# Patient Record
Sex: Female | Born: 1955 | Race: White | Hispanic: No | Marital: Married | Smoking: Never smoker
Health system: Southern US, Community
[De-identification: ages and names within clinical notes are randomized; demographics above are authoritative.]

## PROBLEM LIST (undated history)

## (undated) DIAGNOSIS — G8929 Other chronic pain: Secondary | ICD-10-CM

## (undated) DIAGNOSIS — I10 Essential (primary) hypertension: Secondary | ICD-10-CM

## (undated) DIAGNOSIS — M549 Dorsalgia, unspecified: Secondary | ICD-10-CM

## (undated) DIAGNOSIS — Z9889 Other specified postprocedural states: Secondary | ICD-10-CM

## (undated) DIAGNOSIS — Z973 Presence of spectacles and contact lenses: Secondary | ICD-10-CM

## (undated) DIAGNOSIS — M5137 Other intervertebral disc degeneration, lumbosacral region: Secondary | ICD-10-CM

## (undated) DIAGNOSIS — M109 Gout, unspecified: Secondary | ICD-10-CM

## (undated) DIAGNOSIS — R112 Nausea with vomiting, unspecified: Secondary | ICD-10-CM

## (undated) DIAGNOSIS — M1990 Unspecified osteoarthritis, unspecified site: Secondary | ICD-10-CM

## (undated) DIAGNOSIS — M51379 Other intervertebral disc degeneration, lumbosacral region without mention of lumbar back pain or lower extremity pain: Secondary | ICD-10-CM

## (undated) DIAGNOSIS — R351 Nocturia: Secondary | ICD-10-CM

## (undated) DIAGNOSIS — A809 Acute poliomyelitis, unspecified: Secondary | ICD-10-CM

## (undated) DIAGNOSIS — K219 Gastro-esophageal reflux disease without esophagitis: Secondary | ICD-10-CM

## (undated) DIAGNOSIS — E119 Type 2 diabetes mellitus without complications: Secondary | ICD-10-CM

## (undated) HISTORY — PX: COLONOSCOPY: SHX174

## (undated) HISTORY — PX: FOOT SURGERY: SHX648

## (undated) HISTORY — PX: ABDOMINAL HYSTERECTOMY: SHX81

## (undated) HISTORY — PX: TUBAL LIGATION: SHX77

## (undated) HISTORY — PX: OTHER SURGICAL HISTORY: SHX169

---

## 2000-01-02 HISTORY — PX: TOTAL ABDOMINAL HYSTERECTOMY: SHX209

## 2000-01-02 HISTORY — PX: ABDOMINAL HYSTERECTOMY: SHX81

## 2001-01-31 ENCOUNTER — Emergency Department (HOSPITAL_COMMUNITY): Admission: EM | Admit: 2001-01-31 | Discharge: 2001-01-31 | Payer: Self-pay | Admitting: Emergency Medicine

## 2001-01-31 ENCOUNTER — Encounter: Payer: Self-pay | Admitting: Emergency Medicine

## 2006-07-04 ENCOUNTER — Other Ambulatory Visit: Admission: RE | Admit: 2006-07-04 | Discharge: 2006-07-04 | Payer: Self-pay | Admitting: Gynecology

## 2007-07-24 ENCOUNTER — Ambulatory Visit (HOSPITAL_COMMUNITY): Admission: RE | Admit: 2007-07-24 | Discharge: 2007-07-24 | Payer: Self-pay | Admitting: Family Medicine

## 2009-04-09 ENCOUNTER — Emergency Department (HOSPITAL_COMMUNITY): Admission: EM | Admit: 2009-04-09 | Discharge: 2009-04-09 | Payer: Self-pay | Admitting: Emergency Medicine

## 2010-03-22 LAB — RAPID STREP SCREEN (MED CTR MEBANE ONLY): Streptococcus, Group A Screen (Direct): NEGATIVE

## 2010-05-09 ENCOUNTER — Other Ambulatory Visit: Payer: Self-pay | Admitting: Medical

## 2011-12-18 ENCOUNTER — Encounter (HOSPITAL_COMMUNITY): Payer: Self-pay

## 2011-12-18 ENCOUNTER — Emergency Department (HOSPITAL_COMMUNITY)
Admission: EM | Admit: 2011-12-18 | Discharge: 2011-12-18 | Disposition: A | Discharge status: Home / Self Care | Payer: BC Managed Care – PPO | Attending: Emergency Medicine | Admitting: Emergency Medicine

## 2011-12-18 ENCOUNTER — Emergency Department (HOSPITAL_COMMUNITY): Payer: BC Managed Care – PPO

## 2011-12-18 DIAGNOSIS — R112 Nausea with vomiting, unspecified: Secondary | ICD-10-CM

## 2011-12-18 DIAGNOSIS — Z8619 Personal history of other infectious and parasitic diseases: Secondary | ICD-10-CM | POA: Insufficient documentation

## 2011-12-18 DIAGNOSIS — R197 Diarrhea, unspecified: Secondary | ICD-10-CM | POA: Insufficient documentation

## 2011-12-18 DIAGNOSIS — I10 Essential (primary) hypertension: Secondary | ICD-10-CM | POA: Insufficient documentation

## 2011-12-18 DIAGNOSIS — R7402 Elevation of levels of lactic acid dehydrogenase (LDH): Secondary | ICD-10-CM | POA: Insufficient documentation

## 2011-12-18 DIAGNOSIS — R109 Unspecified abdominal pain: Secondary | ICD-10-CM

## 2011-12-18 DIAGNOSIS — R7401 Elevation of levels of liver transaminase levels: Secondary | ICD-10-CM | POA: Insufficient documentation

## 2011-12-18 DIAGNOSIS — R1011 Right upper quadrant pain: Secondary | ICD-10-CM | POA: Insufficient documentation

## 2011-12-18 DIAGNOSIS — Z79899 Other long term (current) drug therapy: Secondary | ICD-10-CM | POA: Insufficient documentation

## 2011-12-18 HISTORY — DX: Essential (primary) hypertension: I10

## 2011-12-18 HISTORY — DX: Acute poliomyelitis, unspecified: A80.9

## 2011-12-18 LAB — HEPATIC FUNCTION PANEL
ALT: 65 U/L — ABNORMAL HIGH (ref 0–35)
AST: 67 U/L — ABNORMAL HIGH (ref 0–37)
Albumin: 4.3 g/dL (ref 3.5–5.2)
Alkaline Phosphatase: 90 U/L (ref 39–117)
Bilirubin, Direct: 0.1 mg/dL (ref 0.0–0.3)
Indirect Bilirubin: 0.5 mg/dL (ref 0.3–0.9)
Total Bilirubin: 0.6 mg/dL (ref 0.3–1.2)
Total Protein: 8.7 g/dL — ABNORMAL HIGH (ref 6.0–8.3)

## 2011-12-18 LAB — CBC WITH DIFFERENTIAL/PLATELET
Basophils Absolute: 0 10*3/uL (ref 0.0–0.1)
Basophils Relative: 0 % (ref 0–1)
Eosinophils Absolute: 0 10*3/uL (ref 0.0–0.7)
Eosinophils Relative: 0 % (ref 0–5)
HCT: 48.6 % — ABNORMAL HIGH (ref 36.0–46.0)
Hemoglobin: 16.7 g/dL — ABNORMAL HIGH (ref 12.0–15.0)
Lymphocytes Relative: 9 % — ABNORMAL LOW (ref 12–46)
Lymphs Abs: 1.1 10*3/uL (ref 0.7–4.0)
MCH: 28.5 pg (ref 26.0–34.0)
MCHC: 34.4 g/dL (ref 30.0–36.0)
MCV: 82.9 fL (ref 78.0–100.0)
Monocytes Absolute: 0.4 10*3/uL (ref 0.1–1.0)
Monocytes Relative: 3 % (ref 3–12)
Neutro Abs: 10.5 10*3/uL — ABNORMAL HIGH (ref 1.7–7.7)
Neutrophils Relative %: 87 % — ABNORMAL HIGH (ref 43–77)
Platelets: 315 10*3/uL (ref 150–400)
RBC: 5.86 MIL/uL — ABNORMAL HIGH (ref 3.87–5.11)
RDW: 13.1 % (ref 11.5–15.5)
WBC: 12 10*3/uL — ABNORMAL HIGH (ref 4.0–10.5)

## 2011-12-18 LAB — URINE MICROSCOPIC-ADD ON

## 2011-12-18 LAB — URINALYSIS, ROUTINE W REFLEX MICROSCOPIC
Bilirubin Urine: NEGATIVE
Glucose, UA: NEGATIVE mg/dL
Ketones, ur: NEGATIVE mg/dL
Leukocytes, UA: NEGATIVE
Nitrite: NEGATIVE
Protein, ur: NEGATIVE mg/dL
Specific Gravity, Urine: 1.015 (ref 1.005–1.030)
Urobilinogen, UA: 0.2 mg/dL (ref 0.0–1.0)
pH: 6 (ref 5.0–8.0)

## 2011-12-18 LAB — BASIC METABOLIC PANEL
BUN: 16 mg/dL (ref 6–23)
CO2: 25 mEq/L (ref 19–32)
Calcium: 10 mg/dL (ref 8.4–10.5)
Chloride: 101 mEq/L (ref 96–112)
Creatinine, Ser: 0.82 mg/dL (ref 0.50–1.10)
GFR calc Af Amer: >90 mL/min (ref >90)
GFR calc non Af Amer: 79 mL/min — ABNORMAL LOW (ref >90)
Glucose, Bld: 134 mg/dL — ABNORMAL HIGH (ref 70–99)
Potassium: 3.2 mEq/L — ABNORMAL LOW (ref 3.5–5.1)
Sodium: 139 mEq/L (ref 135–145)

## 2011-12-18 LAB — LIPASE, BLOOD: Lipase: 14 U/L (ref 11–59)

## 2011-12-18 MED ORDER — ONDANSETRON HCL 4 MG/2ML IJ SOLN
4.0000 mg | Freq: Once | INTRAMUSCULAR | Status: AC
  Administered 2011-12-18: 4 mg via INTRAVENOUS
  Filled 2011-12-18: qty 2

## 2011-12-18 MED ORDER — SODIUM CHLORIDE 0.9 % IV BOLUS (SEPSIS)
1000.0000 mL | Freq: Once | INTRAVENOUS | Status: AC
  Administered 2011-12-18: 1000 mL via INTRAVENOUS

## 2011-12-18 MED ORDER — ONDANSETRON HCL 4 MG/2ML IJ SOLN
INTRAMUSCULAR | Status: AC
  Filled 2011-12-18: qty 2

## 2011-12-18 MED ORDER — HYDROMORPHONE HCL PF 1 MG/ML IJ SOLN
1.0000 mg | Freq: Once | INTRAMUSCULAR | Status: AC
  Administered 2011-12-18: 1 mg via INTRAVENOUS
  Filled 2011-12-18: qty 1

## 2011-12-18 MED ORDER — OXYCODONE-ACETAMINOPHEN 5-325 MG PO TABS: 1.0000 | ORAL_TABLET | ORAL | Status: AC | PRN

## 2011-12-18 MED ORDER — ONDANSETRON HCL 4 MG/2ML IJ SOLN
4.0000 mg | Freq: Once | INTRAMUSCULAR | Status: AC
  Administered 2011-12-18: 4 mg via INTRAVENOUS

## 2011-12-18 MED ORDER — HYDROMORPHONE HCL PF 1 MG/ML IJ SOLN
0.5000 mg | Freq: Once | INTRAMUSCULAR | Status: AC
  Administered 2011-12-18: 0.5 mg via INTRAVENOUS

## 2011-12-18 MED ORDER — SODIUM CHLORIDE 0.9 % IV SOLN
Freq: Once | INTRAVENOUS | Status: AC
  Administered 2011-12-18: 11:00:00 via INTRAVENOUS

## 2011-12-18 MED ORDER — HYDROMORPHONE HCL PF 1 MG/ML IJ SOLN
INTRAMUSCULAR | Status: AC
  Filled 2011-12-18: qty 1

## 2011-12-18 MED ORDER — IOHEXOL 300 MG/ML  SOLN
100.0000 mL | Freq: Once | INTRAMUSCULAR | Status: AC | PRN
  Administered 2011-12-18: 100 mL via INTRAVENOUS

## 2011-12-18 MED ORDER — METOCLOPRAMIDE HCL 10 MG PO TABS: 10.0000 mg | ORAL_TABLET | Freq: Four times a day (QID) | ORAL | Status: DC | PRN

## 2011-12-18 NOTE — ED Provider Notes (Addendum)
History   This chart was scribed for Sara Hutching, MD by Sofie Rower, ED Scribe. The patient was seen in room APA17/APA17 and the patient's care was started at 11:28AM.     CSN: [id number]  Arrival date & time 12/18/11  1033   First MD Initiated Contact with Patient 12/18/11 1128      Chief Complaint  Patient presents with  . Emesis    (Consider location/radiation/quality/duration/timing/severity/associated sxs/prior treatment) The history is provided by the patient. No language interpreter was used.    Sara Stuart is a 56 y.o. female , with a hx of hypertension, who presents to the Emergency Department complaining of sudden, progressively worsening, emesis (multiple episodes), onset yesterday (12/17/11).  Associated symptoms include abdominal pain located at the LUQ and diarrhea ( multiple episodes, characterized as watery). The pt informs she has not taken any medications to relieve her symptoms at present.  The pt denies chest pain and flank pain. Furthermore, the pt denies any exposure to sick contacts.   The pt does not smoke or drink alcohol.   PCP is Dr. Margo Common.    Past Medical History  Diagnosis Date  . Polio   . Hypertension     History reviewed. No pertinent past surgical history.  History reviewed. No pertinent family history.  History  Substance Use Topics  . Smoking status: Not on file  . Smokeless tobacco: Not on file  . Alcohol Use: No    OB History    Grav Para Term Preterm Abortions TAB SAB Ect Mult Living                  Review of Systems  Cardiovascular: Negative for chest pain.  Gastrointestinal: Positive for nausea, vomiting, abdominal pain and diarrhea.  Genitourinary: Negative for flank pain.  All other systems reviewed and are negative.    Allergies  Review of patient's allergies indicates no known allergies.  Home Medications   Current Outpatient Rx  Name  Route  Sig  Dispense  Refill  . COLCHICINE 0.6 MG PO TABS   Oral   Take  0.6 mg by mouth daily.         Marland Kitchen DILTIAZEM HCL ER 120 MG PO CP24   Oral   Take 120 mg by mouth daily.         Marland Kitchen OMEPRAZOLE 40 MG PO CPDR   Oral   Take 40 mg by mouth daily.         Marland Kitchen PRAVASTATIN SODIUM 40 MG PO TABS   Oral   Take 40 mg by mouth daily.         . TRIAMTERENE-HCTZ 37.5-25 MG PO CAPS   Oral   Take 1 capsule by mouth daily.           BP 130/93  Pulse 90  Temp 97.8 F (36.6 C) (Oral)  Resp 20  Ht 5' (1.524 m)  Wt 240 lb (108.863 kg)  BMI 46.87 kg/m2  SpO2 98%  Physical Exam  Nursing note and vitals reviewed. Constitutional: She is oriented to person, place, and time. She appears well-developed and well-nourished.       Obese.   HENT:  Head: Normocephalic and atraumatic.  Eyes: Conjunctivae normal and EOM are normal. Pupils are equal, round, and reactive to light.  Neck: Normal range of motion. Neck supple.  Cardiovascular: Normal rate, regular rhythm and normal heart sounds.   Pulmonary/Chest: Effort normal and breath sounds normal.  Abdominal: Soft. Bowel sounds are normal. There  is tenderness.       Slightly tender at the LUQ.   Musculoskeletal: Normal range of motion.  Neurological: She is alert and oriented to person, place, and time.  Skin: Skin is warm and dry.  Psychiatric: She has a normal mood and affect.    ED Course  Procedures (including critical care time)  DIAGNOSTIC STUDIES: Oxygen Saturation is 98% on room air, normal by my interpretation.    COORDINATION OF CARE:   11:44 AM- Treatment plan discussed with patient. Pt agrees with treatment.    Results for orders placed during the hospital encounter of 12/18/11  CBC WITH DIFFERENTIAL      Component Value Range   WBC 12.0 (*) 4.0 - 10.5 K/uL   RBC 5.86 (*) 3.87 - 5.11 MIL/uL   Hemoglobin 16.7 (*) 12.0 - 15.0 g/dL   HCT 82.9 (*) 56.2 - 13.0 %   MCV 82.9  78.0 - 100.0 fL   MCH 28.5  26.0 - 34.0 pg   MCHC 34.4  30.0 - 36.0 g/dL   RDW 86.5  78.4 - 69.6 %   Platelets  315  150 - 400 K/uL   Neutrophils Relative 87 (*) 43 - 77 %   Neutro Abs 10.5 (*) 1.7 - 7.7 K/uL   Lymphocytes Relative 9 (*) 12 - 46 %   Lymphs Abs 1.1  0.7 - 4.0 K/uL   Monocytes Relative 3  3 - 12 %   Monocytes Absolute 0.4  0.1 - 1.0 K/uL   Eosinophils Relative 0  0 - 5 %   Eosinophils Absolute 0.0  0.0 - 0.7 K/uL   Basophils Relative 0  0 - 1 %   Basophils Absolute 0.0  0.0 - 0.1 K/uL  BASIC METABOLIC PANEL      Component Value Range   Sodium 139  135 - 145 mEq/L   Potassium 3.2 (*) 3.5 - 5.1 mEq/L   Chloride 101  96 - 112 mEq/L   CO2 25  19 - 32 mEq/L   Glucose, Bld 134 (*) 70 - 99 mg/dL   BUN 16  6 - 23 mg/dL   Creatinine, Ser 2.95  0.50 - 1.10 mg/dL   Calcium 28.4  8.4 - 13.2 mg/dL   GFR calc non Af Amer 79 (*) >90 mL/min   GFR calc Af Amer >90  >90 mL/min  URINALYSIS, ROUTINE W REFLEX MICROSCOPIC      Component Value Range   Color, Urine YELLOW  YELLOW   APPearance CLEAR  CLEAR   Specific Gravity, Urine 1.015  1.005 - 1.030   pH 6.0  5.0 - 8.0   Glucose, UA NEGATIVE  NEGATIVE mg/dL   Hgb urine dipstick TRACE (*) NEGATIVE   Bilirubin Urine NEGATIVE  NEGATIVE   Ketones, ur NEGATIVE  NEGATIVE mg/dL   Protein, ur NEGATIVE  NEGATIVE mg/dL   Urobilinogen, UA 0.2  0.0 - 1.0 mg/dL   Nitrite NEGATIVE  NEGATIVE   Leukocytes, UA NEGATIVE  NEGATIVE  URINE MICROSCOPIC-ADD ON      Component Value Range   Squamous Epithelial / LPF FEW (*) RARE   WBC, UA 0-2  <3 WBC/hpf   RBC / HPF 0-2  <3 RBC/hpf   Bacteria, UA FEW (*) RARE  HEPATIC FUNCTION PANEL      Component Value Range   Total Protein 8.7 (*) 6.0 - 8.3 g/dL   Albumin 4.3  3.5 - 5.2 g/dL   AST 67 (*) 0 - 37 U/L  ALT 65 (*) 0 - 35 U/L   Alkaline Phosphatase 90  39 - 117 U/L   Total Bilirubin 0.6  0.3 - 1.2 mg/dL   Bilirubin, Direct 0.1  0.0 - 0.3 mg/dL   Indirect Bilirubin 0.5  0.3 - 0.9 mg/dL  LIPASE, BLOOD      Component Value Range   Lipase 14  11 - 59 U/L       No results found.   1. Abdominal pain    2. Nausea & vomiting   3. Elevated transaminase level      Date: 12/18/2011  Rate: 92  Rhythm: normal sinus rhythm  QRS Axis: normal  Intervals: normal  ST/T Wave abnormalities: normal  Conduction Disutrbances: none  Narrative Interpretation: unremarkable     MDM  Recheck at 1510... Patient still having left upper quadrant pain.  Liver functions noted to be mildly elevated.  Additionally patient has slight elevation in white blood cell count c left shift.  Will obtain CT scan of abdomen/ pelvis with contrast. Discussed with Dr. Preston Fleeting      I personally performed the services described in this documentation, which was scribed in my presence. The recorded information has been reviewed and is accurate.   Sara Hutching, MD 12/18/11 1527  CT scan shows an enlarged lymph node in the portal area with recommendation to have the scan repeated in 3 months. Patient is informed of this as well as the finding of elevated LFTs. She is feeling better after IV fluids and IV hydromorphone and ondansetron. She is discharged with prescription for Percocet and metoclopramide and is referred back to her PCP  Dione Booze, MD 12/18/11 1726

## 2011-12-18 NOTE — ED Notes (Signed)
Complain of n/v and diarrhea since last night

## 2011-12-18 NOTE — ED Notes (Signed)
Pt complain of some chest pain after sitting in waiting room for about 10 min

## 2012-05-27 ENCOUNTER — Other Ambulatory Visit: Payer: Self-pay | Admitting: Neurosurgery

## 2012-06-25 ENCOUNTER — Encounter (HOSPITAL_COMMUNITY): Payer: Self-pay | Admitting: Pharmacy Technician

## 2012-06-26 ENCOUNTER — Encounter (HOSPITAL_COMMUNITY): Payer: Self-pay

## 2012-06-26 ENCOUNTER — Ambulatory Visit (HOSPITAL_COMMUNITY)
Admission: RE | Admit: 2012-06-26 | Discharge: 2012-06-26 | Disposition: A | Discharge status: Home / Self Care | Payer: Commercial Managed Care - PPO | Source: Ambulatory Visit | Attending: Neurosurgery | Admitting: Neurosurgery

## 2012-06-26 ENCOUNTER — Encounter (HOSPITAL_COMMUNITY)
Admission: RE | Admit: 2012-06-26 | Discharge: 2012-06-26 | Disposition: A | Discharge status: Home / Self Care | Payer: Commercial Managed Care - PPO | Source: Ambulatory Visit | Attending: Neurosurgery | Admitting: Neurosurgery

## 2012-06-26 DIAGNOSIS — Z0183 Encounter for blood typing: Secondary | ICD-10-CM | POA: Insufficient documentation

## 2012-06-26 DIAGNOSIS — Z01812 Encounter for preprocedural laboratory examination: Secondary | ICD-10-CM | POA: Insufficient documentation

## 2012-06-26 DIAGNOSIS — Z01818 Encounter for other preprocedural examination: Secondary | ICD-10-CM | POA: Insufficient documentation

## 2012-06-26 DIAGNOSIS — M5126 Other intervertebral disc displacement, lumbar region: Secondary | ICD-10-CM | POA: Insufficient documentation

## 2012-06-26 HISTORY — DX: Nausea with vomiting, unspecified: R11.2

## 2012-06-26 HISTORY — DX: Other specified postprocedural states: Z98.890

## 2012-06-26 HISTORY — DX: Gastro-esophageal reflux disease without esophagitis: K21.9

## 2012-06-26 HISTORY — DX: Unspecified osteoarthritis, unspecified site: M19.90

## 2012-06-26 LAB — CBC WITH DIFFERENTIAL/PLATELET
Basophils Absolute: 0 10*3/uL (ref 0.0–0.1)
Basophils Relative: 0 % (ref 0–1)
Eosinophils Absolute: 0.4 10*3/uL (ref 0.0–0.7)
Eosinophils Relative: 5 % (ref 0–5)
HCT: 42.4 % (ref 36.0–46.0)
Hemoglobin: 14.5 g/dL (ref 12.0–15.0)
Lymphocytes Relative: 33 % (ref 12–46)
Lymphs Abs: 3 10*3/uL (ref 0.7–4.0)
MCH: 28.4 pg (ref 26.0–34.0)
MCHC: 34.2 g/dL (ref 30.0–36.0)
MCV: 83.1 fL (ref 78.0–100.0)
Monocytes Absolute: 0.6 10*3/uL (ref 0.1–1.0)
Monocytes Relative: 7 % (ref 3–12)
Neutro Abs: 5 10*3/uL (ref 1.7–7.7)
Neutrophils Relative %: 55 % (ref 43–77)
Platelets: 271 10*3/uL (ref 150–400)
RBC: 5.1 MIL/uL (ref 3.87–5.11)
RDW: 13 % (ref 11.5–15.5)
WBC: 9 10*3/uL (ref 4.0–10.5)

## 2012-06-26 LAB — BASIC METABOLIC PANEL
BUN: 18 mg/dL (ref 6–23)
CO2: 27 mEq/L (ref 19–32)
Calcium: 9.3 mg/dL (ref 8.4–10.5)
Chloride: 103 mEq/L (ref 96–112)
Creatinine, Ser: 0.75 mg/dL (ref 0.50–1.10)
GFR calc Af Amer: >90 mL/min (ref >90)
GFR calc non Af Amer: >90 mL/min (ref >90)
Glucose, Bld: 103 mg/dL — ABNORMAL HIGH (ref 70–99)
Potassium: 3.7 mEq/L (ref 3.5–5.1)
Sodium: 140 mEq/L (ref 135–145)

## 2012-06-26 LAB — SURGICAL PCR SCREEN
MRSA, PCR: NEGATIVE
Staphylococcus aureus: POSITIVE — AB

## 2012-06-26 LAB — ABO/RH: ABO/RH(D): O POS

## 2012-06-26 LAB — TYPE AND SCREEN
ABO/RH(D): O POS
Antibody Screen: NEGATIVE

## 2012-06-26 NOTE — Progress Notes (Deleted)
FAXED SIGNED BLOOD REFUSAL TO BLOOD BANK.  LEFT MESSAGE AT DR. Luiz Blare OFFICE ABOUT PATIENT BEING JEHOVAS WITNESS AND NO T+S DONE.  PATIENT STATED DR. GRAVES WAS AWARE.

## 2012-06-26 NOTE — Pre-Procedure Instructions (Signed)
Sara Stuart  06/26/2012   Your procedure is scheduled on:   Monday   07/07/12     Report to [facility] at 530 AM.  Call this number if you have problems the morning of surgery: [phone]   Remember:   Do not eat food or drink liquids after midnight.   Take these medicines the morning of surgery with A SIP OF WATER:   COLCHICINE, DILTIAZEM, HYDROCODONE  OR ULTRAM IF NEEDED, PRILOSEC   Do not wear jewelry, make-up or nail polish.  Do not wear lotions, powders, or perfumes. You may wear deodorant.  Do not shave 48 hours prior to surgery. Men may shave face and neck.  Do not bring valuables to the hospital.  [facility] is not responsible                   for any belongings or valuables.  Contacts, dentures or bridgework may not be worn into surgery.  Leave suitcase in the car. After surgery it may be brought to your room.  For patients admitted to the hospital, checkout time is 11:00 AM the day of  discharge.   Patients discharged the day of surgery will not be allowed to drive  home.  Name and phone number of your driver:  Special Instructions: Shower using CHG 2 nights before surgery and the night before surgery.  If you shower the day of surgery use CHG.  Use special wash - you have one bottle of CHG for all showers.  You should use approximately 1/3 of the bottle for each shower.   Please read over the following fact sheets that you were given: Pain Booklet, Coughing and Deep Breathing, Blood Transfusion Information, MRSA Information and Surgical Site Infection Prevention

## 2012-06-26 NOTE — Progress Notes (Signed)
06/26/12 1338  OBSTRUCTIVE SLEEP APNEA  Have you ever been diagnosed with sleep apnea through a sleep study? No  Do you snore loudly (loud enough to be heard through closed doors)?  0  Do you often feel tired, fatigued, or sleepy during the daytime? 1  Has anyone observed you stop breathing during your sleep? 0  Do you have, or are you being treated for high blood pressure? 1  BMI more than 35 kg/m2? 1  Age over 56 years old? 1  Gender: 0  Obstructive Sleep Apnea Score 4  Score 4 or greater  Results sent to PCP

## 2012-07-01 HISTORY — PX: BACK SURGERY: SHX140

## 2012-07-07 MED ORDER — CEFAZOLIN SODIUM-DEXTROSE 2-3 GM-% IV SOLR
2.0000 g | INTRAVENOUS | Status: AC
  Administered 2012-07-08: 2 g via INTRAVENOUS
  Filled 2012-07-07: qty 50

## 2012-07-07 MED ORDER — DEXAMETHASONE SODIUM PHOSPHATE 10 MG/ML IJ SOLN
10.0000 mg | INTRAMUSCULAR | Status: AC
  Administered 2012-07-08: 10 mg via INTRAVENOUS
  Filled 2012-07-07: qty 1

## 2012-07-07 NOTE — Progress Notes (Signed)
Pt made aware to report to [facility] on 07/08/12 at 8:30AM.

## 2012-07-08 ENCOUNTER — Ambulatory Visit (HOSPITAL_COMMUNITY): Payer: Commercial Managed Care - PPO

## 2012-07-08 ENCOUNTER — Encounter (HOSPITAL_COMMUNITY)
Admission: RE | Disposition: A | Discharge status: Home / Self Care | Payer: Self-pay | Source: Ambulatory Visit | Attending: Neurosurgery

## 2012-07-08 ENCOUNTER — Encounter (HOSPITAL_COMMUNITY): Payer: Self-pay | Admitting: Certified Registered"

## 2012-07-08 ENCOUNTER — Ambulatory Visit (HOSPITAL_COMMUNITY): Payer: Commercial Managed Care - PPO | Admitting: Certified Registered"

## 2012-07-08 ENCOUNTER — Inpatient Hospital Stay (HOSPITAL_COMMUNITY)
Admission: RE | Admit: 2012-07-08 | Discharge: 2012-07-09 | DRG: 460 | Disposition: A | Discharge status: Home / Self Care | Payer: Commercial Managed Care - PPO | Source: Ambulatory Visit | Attending: Neurosurgery | Admitting: Neurosurgery

## 2012-07-08 ENCOUNTER — Encounter (HOSPITAL_COMMUNITY): Payer: Self-pay | Admitting: *Deleted

## 2012-07-08 DIAGNOSIS — K219 Gastro-esophageal reflux disease without esophagitis: Secondary | ICD-10-CM | POA: Diagnosis present

## 2012-07-08 DIAGNOSIS — M48062 Spinal stenosis, lumbar region with neurogenic claudication: Principal | ICD-10-CM | POA: Diagnosis present

## 2012-07-08 DIAGNOSIS — M4310 Spondylolisthesis, site unspecified: Secondary | ICD-10-CM | POA: Diagnosis present

## 2012-07-08 SURGERY — POSTERIOR LUMBAR FUSION 1 LEVEL
Anesthesia: General | Site: Back | Wound class: Clean

## 2012-07-08 MED ORDER — SODIUM CHLORIDE 0.9 % IV SOLN
INTRAVENOUS | Status: AC
  Filled 2012-07-08: qty 500

## 2012-07-08 MED ORDER — METOCLOPRAMIDE HCL 10 MG PO TABS
10.0000 mg | ORAL_TABLET | Freq: Four times a day (QID) | ORAL | Status: DC | PRN
  Filled 2012-07-08: qty 1

## 2012-07-08 MED ORDER — PANTOPRAZOLE SODIUM 40 MG PO TBEC
80.0000 mg | DELAYED_RELEASE_TABLET | Freq: Every day | ORAL | Status: DC
  Administered 2012-07-09: 80 mg via ORAL
  Filled 2012-07-08: qty 2

## 2012-07-08 MED ORDER — HYDROMORPHONE HCL PF 1 MG/ML IJ SOLN
0.5000 mg | INTRAMUSCULAR | Status: DC | PRN
  Administered 2012-07-08: 1 mg via INTRAVENOUS
  Filled 2012-07-08: qty 1

## 2012-07-08 MED ORDER — VECURONIUM BROMIDE 10 MG IV SOLR
INTRAVENOUS | Status: DC | PRN
  Administered 2012-07-08 (×2): 2 mg via INTRAVENOUS

## 2012-07-08 MED ORDER — DIAZEPAM 5 MG PO TABS
5.0000 mg | ORAL_TABLET | Freq: Four times a day (QID) | ORAL | Status: DC | PRN
  Administered 2012-07-08 – 2012-07-09 (×2): 5 mg via ORAL
  Administered 2012-07-09: 10 mg via ORAL
  Filled 2012-07-08: qty 2
  Filled 2012-07-08: qty 1

## 2012-07-08 MED ORDER — POLYETHYLENE GLYCOL 3350 17 G PO PACK
17.0000 g | PACK | Freq: Every day | ORAL | Status: DC | PRN
  Filled 2012-07-08: qty 1

## 2012-07-08 MED ORDER — LACTATED RINGERS IV SOLN
INTRAVENOUS | Status: DC | PRN
  Administered 2012-07-08 (×2): via INTRAVENOUS

## 2012-07-08 MED ORDER — PROPOFOL 10 MG/ML IV BOLUS
INTRAVENOUS | Status: DC | PRN
  Administered 2012-07-08: 200 mg via INTRAVENOUS

## 2012-07-08 MED ORDER — OXYCODONE-ACETAMINOPHEN 5-325 MG PO TABS
1.0000 | ORAL_TABLET | ORAL | Status: DC | PRN
  Administered 2012-07-08 (×2): 2 via ORAL
  Filled 2012-07-08 (×2): qty 2

## 2012-07-08 MED ORDER — SENNA 8.6 MG PO TABS
1.0000 | ORAL_TABLET | Freq: Two times a day (BID) | ORAL | Status: DC
  Administered 2012-07-09: 8.6 mg via ORAL
  Filled 2012-07-08 (×2): qty 1

## 2012-07-08 MED ORDER — BUPIVACAINE HCL (PF) 0.25 % IJ SOLN
INTRAMUSCULAR | Status: DC | PRN
  Administered 2012-07-08: 20 mL

## 2012-07-08 MED ORDER — GLYCOPYRROLATE 0.2 MG/ML IJ SOLN
INTRAMUSCULAR | Status: DC | PRN
  Administered 2012-07-08: 0.4 mg via INTRAVENOUS

## 2012-07-08 MED ORDER — CEFAZOLIN SODIUM 1-5 GM-% IV SOLN
1.0000 g | Freq: Three times a day (TID) | INTRAVENOUS | Status: AC
  Administered 2012-07-08 – 2012-07-09 (×2): 1 g via INTRAVENOUS
  Filled 2012-07-08 (×2): qty 50

## 2012-07-08 MED ORDER — ZOLPIDEM TARTRATE 5 MG PO TABS: 5.0000 mg | ORAL_TABLET | Freq: Every evening | ORAL | Status: DC | PRN

## 2012-07-08 MED ORDER — SODIUM CHLORIDE 0.9 % IJ SOLN
3.0000 mL | Freq: Two times a day (BID) | INTRAMUSCULAR | Status: DC
  Administered 2012-07-08 (×2): 3 mL via INTRAVENOUS

## 2012-07-08 MED ORDER — SODIUM CHLORIDE 0.9 % IJ SOLN: 3.0000 mL | INTRAMUSCULAR | Status: DC | PRN

## 2012-07-08 MED ORDER — DILTIAZEM HCL ER 120 MG PO CP24
120.0000 mg | ORAL_CAPSULE | Freq: Every day | ORAL | Status: DC
  Administered 2012-07-09: 120 mg via ORAL
  Filled 2012-07-08 (×2): qty 1

## 2012-07-08 MED ORDER — NEOSTIGMINE METHYLSULFATE 1 MG/ML IJ SOLN
INTRAMUSCULAR | Status: DC | PRN
  Administered 2012-07-08: 2.5 mg via INTRAVENOUS

## 2012-07-08 MED ORDER — 0.9 % SODIUM CHLORIDE (POUR BTL) OPTIME
TOPICAL | Status: DC | PRN
  Administered 2012-07-08: 1000 mL

## 2012-07-08 MED ORDER — BACITRACIN 50000 UNITS IM SOLR
INTRAMUSCULAR | Status: AC
  Filled 2012-07-08: qty 1

## 2012-07-08 MED ORDER — ONDANSETRON HCL 4 MG/2ML IJ SOLN: 4.0000 mg | Freq: Once | INTRAMUSCULAR | Status: DC | PRN

## 2012-07-08 MED ORDER — ARTIFICIAL TEARS OP OINT
TOPICAL_OINTMENT | OPHTHALMIC | Status: DC | PRN
  Administered 2012-07-08: 1 via OPHTHALMIC

## 2012-07-08 MED ORDER — ONDANSETRON HCL 4 MG/2ML IJ SOLN
INTRAMUSCULAR | Status: DC | PRN
  Administered 2012-07-08: 4 mg via INTRAVENOUS

## 2012-07-08 MED ORDER — ONDANSETRON HCL 4 MG/2ML IJ SOLN: 4.0000 mg | INTRAMUSCULAR | Status: DC | PRN

## 2012-07-08 MED ORDER — LIDOCAINE HCL (CARDIAC) 20 MG/ML IV SOLN
INTRAVENOUS | Status: DC | PRN
  Administered 2012-07-08: 100 mg via INTRAVENOUS

## 2012-07-08 MED ORDER — TRIAMTERENE-HCTZ 37.5-25 MG PO CAPS
1.0000 | ORAL_CAPSULE | Freq: Every day | ORAL | Status: DC
  Administered 2012-07-08 – 2012-07-09 (×2): 1 via ORAL
  Filled 2012-07-08 (×2): qty 1

## 2012-07-08 MED ORDER — ROCURONIUM BROMIDE 100 MG/10ML IV SOLN
INTRAVENOUS | Status: DC | PRN
  Administered 2012-07-08: 50 mg via INTRAVENOUS

## 2012-07-08 MED ORDER — ACETAMINOPHEN 650 MG RE SUPP: 650.0000 mg | RECTAL | Status: DC | PRN

## 2012-07-08 MED ORDER — ATORVASTATIN CALCIUM 10 MG PO TABS
10.0000 mg | ORAL_TABLET | Freq: Every day | ORAL | Status: DC
  Administered 2012-07-08: 10 mg via ORAL
  Filled 2012-07-08 (×2): qty 1

## 2012-07-08 MED ORDER — BISACODYL 10 MG RE SUPP: 10.0000 mg | Freq: Every day | RECTAL | Status: DC | PRN

## 2012-07-08 MED ORDER — SODIUM CHLORIDE 0.9 % IR SOLN
Status: DC | PRN
  Administered 2012-07-08: 10:00:00

## 2012-07-08 MED ORDER — HYDROMORPHONE HCL PF 1 MG/ML IJ SOLN
INTRAMUSCULAR | Status: AC
  Filled 2012-07-08: qty 1

## 2012-07-08 MED ORDER — SIMVASTATIN 20 MG PO TABS: 20.0000 mg | ORAL_TABLET | Freq: Every day | ORAL | Status: DC

## 2012-07-08 MED ORDER — ALUM & MAG HYDROXIDE-SIMETH 200-200-20 MG/5ML PO SUSP: 30.0000 mL | Freq: Four times a day (QID) | ORAL | Status: DC | PRN

## 2012-07-08 MED ORDER — FENTANYL CITRATE 0.05 MG/ML IJ SOLN
INTRAMUSCULAR | Status: DC | PRN
  Administered 2012-07-08: 100 ug via INTRAVENOUS
  Administered 2012-07-08: 50 ug via INTRAVENOUS
  Administered 2012-07-08: 150 ug via INTRAVENOUS

## 2012-07-08 MED ORDER — THROMBIN 20000 UNITS EX SOLR
CUTANEOUS | Status: DC | PRN
  Administered 2012-07-08: 10:00:00 via TOPICAL

## 2012-07-08 MED ORDER — MENTHOL 3 MG MT LOZG
1.0000 | LOZENGE | OROMUCOSAL | Status: DC | PRN
  Filled 2012-07-08: qty 9

## 2012-07-08 MED ORDER — PHENOL 1.4 % MT LIQD: 1.0000 | OROMUCOSAL | Status: DC | PRN

## 2012-07-08 MED ORDER — FLEET ENEMA 7-19 GM/118ML RE ENEM
1.0000 | ENEMA | Freq: Once | RECTAL | Status: AC | PRN
  Filled 2012-07-08: qty 1

## 2012-07-08 MED ORDER — ACETAMINOPHEN 325 MG PO TABS: 650.0000 mg | ORAL_TABLET | ORAL | Status: DC | PRN

## 2012-07-08 MED ORDER — HYDROCODONE-ACETAMINOPHEN 5-325 MG PO TABS
1.0000 | ORAL_TABLET | ORAL | Status: DC | PRN
  Administered 2012-07-09 (×2): 2 via ORAL
  Filled 2012-07-08 (×2): qty 2

## 2012-07-08 MED ORDER — HYDROMORPHONE HCL PF 1 MG/ML IJ SOLN
0.2500 mg | INTRAMUSCULAR | Status: DC | PRN
Start: 2012-07-08 — End: 2012-07-08
  Administered 2012-07-08 (×4): 0.5 mg via INTRAVENOUS

## 2012-07-08 MED ORDER — DIAZEPAM 5 MG PO TABS
ORAL_TABLET | ORAL | Status: AC
  Filled 2012-07-08: qty 1

## 2012-07-08 MED ORDER — PHENYLEPHRINE HCL 10 MG/ML IJ SOLN
INTRAMUSCULAR | Status: DC | PRN
  Administered 2012-07-08: 120 ug via INTRAVENOUS

## 2012-07-08 SURGICAL SUPPLY — 67 items
ADH SKN CLS APL DERMABOND .7 (GAUZE/BANDAGES/DRESSINGS)
ADH SKN CLS LQ APL DERMABOND (GAUZE/BANDAGES/DRESSINGS) ×1
APL SKNCLS STERI-STRIP NONHPOA (GAUZE/BANDAGES/DRESSINGS) ×1
BAG DECANTER FOR FLEXI CONT (MISCELLANEOUS) ×2 IMPLANT
BENZOIN TINCTURE PRP APPL 2/3 (GAUZE/BANDAGES/DRESSINGS) ×2 IMPLANT
BLADE SURG ROTATE 9660 (MISCELLANEOUS) IMPLANT
BRUSH SCRUB EZ PLAIN DRY (MISCELLANEOUS) ×2 IMPLANT
BUR MATCHSTICK NEURO 3.0 LAGG (BURR) ×2 IMPLANT
CAGE 10X22 (Cage) ×2 IMPLANT
CANISTER SUCTION 2500CC (MISCELLANEOUS) ×2 IMPLANT
CAP LCK SPNE (Orthopedic Implant) ×4 IMPLANT
CAP LOCK SPINE RADIUS (Orthopedic Implant) ×4 IMPLANT
CAP LOCKING (Orthopedic Implant) ×8 IMPLANT
CLOTH BEACON ORANGE TIMEOUT ST (SAFETY) ×2 IMPLANT
CONT SPEC 4OZ CLIKSEAL STRL BL (MISCELLANEOUS) ×4 IMPLANT
COVER BACK TABLE 24X17X13 BIG (DRAPES) IMPLANT
COVER TABLE BACK 60X90 (DRAPES) ×2 IMPLANT
DECANTER SPIKE VIAL GLASS SM (MISCELLANEOUS) IMPLANT
DERMABOND ADHESIVE PROPEN (GAUZE/BANDAGES/DRESSINGS) ×1
DERMABOND ADVANCED (GAUZE/BANDAGES/DRESSINGS)
DERMABOND ADVANCED .7 DNX12 (GAUZE/BANDAGES/DRESSINGS) IMPLANT
DERMABOND ADVANCED .7 DNX6 (GAUZE/BANDAGES/DRESSINGS) ×1 IMPLANT
DRAPE C-ARM 42X72 X-RAY (DRAPES) ×4 IMPLANT
DRAPE LAPAROTOMY 100X72X124 (DRAPES) ×2 IMPLANT
DRAPE POUCH INSTRU U-SHP 10X18 (DRAPES) ×2 IMPLANT
DRAPE PROXIMA HALF (DRAPES) IMPLANT
DRAPE SURG 17X23 STRL (DRAPES) ×8 IMPLANT
DURAPREP 26ML APPLICATOR (WOUND CARE) ×2 IMPLANT
ELECT REM PT RETURN 9FT ADLT (ELECTROSURGICAL) ×2
ELECTRODE REM PT RTRN 9FT ADLT (ELECTROSURGICAL) ×1 IMPLANT
EVACUATOR 1/8 PVC DRAIN (DRAIN) ×2 IMPLANT
GAUZE SPONGE 4X4 16PLY XRAY LF (GAUZE/BANDAGES/DRESSINGS) IMPLANT
GLOVE BIOGEL PI IND STRL 7.0 (GLOVE) ×2 IMPLANT
GLOVE BIOGEL PI INDICATOR 7.0 (GLOVE) ×2
GLOVE ECLIPSE 7.5 STRL STRAW (GLOVE) ×2 IMPLANT
GLOVE ECLIPSE 8.5 STRL (GLOVE) ×4 IMPLANT
GLOVE EXAM NITRILE LRG STRL (GLOVE) IMPLANT
GLOVE EXAM NITRILE MD LF STRL (GLOVE) ×2 IMPLANT
GLOVE EXAM NITRILE XL STR (GLOVE) IMPLANT
GLOVE EXAM NITRILE XS STR PU (GLOVE) IMPLANT
GLOVE SS BIOGEL STRL SZ 6.5 (GLOVE) ×3 IMPLANT
GLOVE SUPERSENSE BIOGEL SZ 6.5 (GLOVE) ×3
GOWN BRE IMP SLV AUR LG STRL (GOWN DISPOSABLE) ×4 IMPLANT
GOWN BRE IMP SLV AUR XL STRL (GOWN DISPOSABLE) ×4 IMPLANT
GOWN STRL REIN 2XL LVL4 (GOWN DISPOSABLE) IMPLANT
KIT BASIN OR (CUSTOM PROCEDURE TRAY) ×2 IMPLANT
KIT ROOM TURNOVER OR (KITS) ×2 IMPLANT
NEEDLE HYPO 22GX1.5 SAFETY (NEEDLE) ×2 IMPLANT
NS IRRIG 1000ML POUR BTL (IV SOLUTION) ×2 IMPLANT
PACK LAMINECTOMY NEURO (CUSTOM PROCEDURE TRAY) ×2 IMPLANT
ROD RADIUS 40MM (Neuro Prosthesis/Implant) ×4 IMPLANT
ROD SPNL 40X5.5XNS TI RDS (Neuro Prosthesis/Implant) ×2 IMPLANT
SCREW 5.75X40M (Screw) ×8 IMPLANT
SPONGE GAUZE 4X4 12PLY (GAUZE/BANDAGES/DRESSINGS) ×2 IMPLANT
SPONGE SURGIFOAM ABS GEL 100 (HEMOSTASIS) ×2 IMPLANT
STRIP CLOSURE SKIN 1/2X4 (GAUZE/BANDAGES/DRESSINGS) ×2 IMPLANT
SUT VIC AB 0 CT1 18XCR BRD8 (SUTURE) ×1 IMPLANT
SUT VIC AB 0 CT1 8-18 (SUTURE) ×2
SUT VIC AB 2-0 CT1 18 (SUTURE) ×2 IMPLANT
SUT VIC AB 3-0 SH 8-18 (SUTURE) ×4 IMPLANT
SYR 20ML ECCENTRIC (SYRINGE) ×2 IMPLANT
TAPE CLOTH SURG 4X10 WHT LF (GAUZE/BANDAGES/DRESSINGS) ×2 IMPLANT
TOWEL OR 17X24 6PK STRL BLUE (TOWEL DISPOSABLE) ×2 IMPLANT
TOWEL OR 17X26 10 PK STRL BLUE (TOWEL DISPOSABLE) ×2 IMPLANT
TRAY FOLEY CATH 14FRSI W/METER (CATHETERS) ×2 IMPLANT
WATER STERILE IRR 1000ML POUR (IV SOLUTION) ×2 IMPLANT
WEDGE TANGENT 10X26MM ×2 IMPLANT

## 2012-07-08 NOTE — Transfer of Care (Signed)
Immediate Anesthesia Transfer of Care Note  Patient: Sara Stuart  Procedure(s) Performed: Procedure(s) with comments: POSTERIOR LUMBAR FUSION 1 LEVEL (N/A) - Lumbar four-five posterior lumbar interbody fusion  Patient Location: PACU  Anesthesia Type:General  Level of Consciousness: awake and oriented  Airway & Oxygen Therapy: Patient Spontanous Breathing and Patient connected to nasal cannula oxygen  Post-op Assessment: Report given to PACU RN and Patient moving all extremities X 4  Post vital signs: Reviewed and stable  Complications: No apparent anesthesia complications

## 2012-07-08 NOTE — Op Note (Signed)
Date of procedure: 07/08/2012  Date of dictation: Same  Service: Neurosurgery  Preoperative diagnosis: L4-5 grade 1 degenerative spondylolisthesis with stenosis and neurogenic claudication.  Postoperative diagnosis: Same  Procedure Name: L4-5 decompressive laminectomy with bilateral L4 and L5 decompressive foraminotomies in order to relieve pressure from symptomatic nerve roots, much more extensive decompression that would be required for simple interbody fusion alone.  L4-5 posterior lumbar interbody fusion utilizing tangent interbody allograft wedge Telamon interbody peek cage and local autograft.  L4-5 posterior lateral pieces utilizing nonsegmental pedicle screw station and local autograft.  Surgeon:Rayleigh Gillyard A.Lisia Westbay, M.D.  Asst. Surgeon: Gerlene Fee  Anesthesia: General  Indication: 57 year old female with severe back and bilateral lower extremity pain left greater than right consistent with neurogenic claudication. Workup demonstrates evidence of a grade 1 L4-5 degenerative spondylolisthesis with very severe stenosis affecting both L4 and L5 nerve roots. Patient has failed conservative management and presents now for decompression and fusion surgery in hopes of improving her symptoms.  Operative note: After induction of anesthesia, patient positioned prone onto Wilson frame and appropriately padded. Lumbar region prepped and draped. Incision made overlying the L3-4-5 levels. This carried down sharply bilaterally in a subperiosteal dissection performed exposing the lamina facet joints and transverse processes of L4 and L5. Retractor was placed intraoperative fluoroscopy is used levels were confirmed. Decompressive laminectomies then performed using Leksell rongeurs Kerrison or his high-speed drill to remove the entire lamina of L4 the entire inferior facet of L4 bilaterally superior facet of L5 bilaterally and the superior aspect the L5 lamina. All bone is cleaned in use and later autografting.  Ligament flavum was elevated and resected these will fashion and Kerrison rongeurs. Very extensive decompressive foraminotomies were performed of course exiting L4 and L5 nerve roots bilaterally. Epidermides pus is quite good and cut. Bilateral discectomies performed at L4-5. The spaces and reamed and then cut with 10 mm tangent is his with a 10 mm distractor less than patient's contralateral side. On the right side a 10 x 22 mm Telamon cage packed with morselized autograft was impacted in place and recessed roughly 2 mm and posterior portal margin. On the left-sided distractor was removed. The space was further curettaged and morselize autograft packed in the interspace. A 10 x 26 mm tangent wedges and packed in place. During the final impaction of this graft the posterior one third of the graft fractured. This was removed. It was felt best just to leave the remaining allograft and rather than try to get it out. The interspace was further packed with morselized autograft. Pedicles of L4 and L5 were decorticated using high-speed drill. Pedicles and probed using pedicle awl. Alternatives and tapped with a 5.2 fiber temperature Olson probed and found to be solidly within bone. 5.75 x 40 mm radius brand screws and placed bilaterally at L4 and L5. Transverse processes of L4 and L5 were then decorticated high-speed drill. Morselized autograft packed posterior laterally for later fusion. Short segment titanium rods and posterior screws at L4-L5. Locking caps were then placed over the screws were locking caps and engaged with the construct under compression. Final images revealed good position bone graft and hardware at proper upper level with normal alignment spine. Wounds that area one final time. Hemostasis assured with bipolar try her. Gelfoam postoperative hemostasis. Medium Hemovac drain was left in epidural space. Wounds and close in layers with Vicryl sutures. Steri-Strips triggers were applied. There were no  apparent locations. Patient well and she returns they're covering postop.

## 2012-07-08 NOTE — Plan of Care (Signed)
Problem: Consults Goal: Diagnosis - Spinal Surgery Outcome: Completed/Met Date Met:  07/08/12 Thoraco/Lumbar Spine Fusion

## 2012-07-08 NOTE — Preoperative (Signed)
Beta Blockers   Reason not to administer Beta Blockers:Not Applicable 

## 2012-07-08 NOTE — H&P (Signed)
Sara Stuart is an 57 y.o. female.   Chief Complaint: Back and leg pain HPI: 57 year old female with severe back and bilateral lower extremity pain paresthesias and weakness consistent with neurogenic claudication and failing conservative management. Workup demonstrates evidence of a grade 1 L4-5 degenerative spondylolisthesis with severe stenosis. Patient presents now for decompression and fusion in hopes of improving her symptoms.  Past Medical History  Diagnosis Date  . Polio   . Hypertension   . Complication of anesthesia   . PONV (postoperative nausea and vomiting)   . GERD (gastroesophageal reflux disease)   . Arthritis     Past Surgical History  Procedure Laterality Date  . Abdominal hysterectomy      TUBAL PREGNANCY    History reviewed. No pertinent family history. Social History:  reports that she has never smoked. She does not have any smokeless tobacco history on file. She reports that she does not drink alcohol or use illicit drugs.  Allergies: No Known Allergies  Medications Prior to Admission  Medication Sig Dispense Refill  . cyclobenzaprine (FLEXERIL) 10 MG tablet Take 10 mg by mouth at bedtime as needed for muscle spasms.      Marland Kitchen diltiazem (DILACOR XR) 120 MG 24 hr capsule Take 120 mg by mouth daily.      Marland Kitchen HYDROcodone-acetaminophen (NORCO) 10-325 MG per tablet Take 1 tablet by mouth 2 (two) times daily.      . metoCLOPramide (REGLAN) 10 MG tablet Take 1 tablet (10 mg total) by mouth every 6 (six) hours as needed (nausea).  30 tablet  0  . omeprazole (PRILOSEC) 40 MG capsule Take 40 mg by mouth daily.      . pravastatin (PRAVACHOL) 40 MG tablet Take 40 mg by mouth daily.      . traMADol (ULTRAM) 50 MG tablet Take 100 mg by mouth at bedtime as needed for pain.      Marland Kitchen triamterene-hydrochlorothiazide (DYAZIDE) 37.5-25 MG per capsule Take 1 capsule by mouth daily.      . colchicine 0.6 MG tablet Take 0.6 mg by mouth daily as needed (for gout pain).         No results  found for this or any previous visit (from the past 48 hour(s)). No results found.  Review of Systems  Constitutional: Negative.   HENT: Negative.   Eyes: Negative.   Respiratory: Negative.   Cardiovascular: Negative.   Gastrointestinal: Negative.   Genitourinary: Negative.   Musculoskeletal: Negative.   Skin: Negative.   Neurological: Negative.   Endo/Heme/Allergies: Negative.   Psychiatric/Behavioral: Negative.     Blood pressure 122/85, pulse 74, temperature 98.1 F (36.7 C), temperature source Oral, resp. rate 20, SpO2 96.00%. Physical Exam  Constitutional: She is oriented to person, place, and time. She appears well-developed and well-nourished. No distress.  HENT:  Head: Normocephalic and atraumatic.  Right Ear: External ear normal.  Left Ear: External ear normal.  Nose: Nose normal.  Mouth/Throat: Oropharynx is clear and moist.  Eyes: Conjunctivae and EOM are normal. Pupils are equal, round, and reactive to light. Right eye exhibits no discharge. Left eye exhibits no discharge.  Neck: Normal range of motion. Neck supple. No tracheal deviation present. No thyromegaly present.  Cardiovascular: Normal rate, regular rhythm, normal heart sounds and intact distal pulses.  Exam reveals no friction rub.   No murmur heard. Respiratory: Effort normal and breath sounds normal. No respiratory distress. She has no wheezes.  GI: Soft. Bowel sounds are normal. She exhibits no distension. There is  no tenderness.  Musculoskeletal: Normal range of motion. She exhibits no edema and no tenderness.  Neurological: She is alert and oriented to person, place, and time. She has normal reflexes. She displays normal reflexes. No cranial nerve deficit. She exhibits normal muscle tone. Coordination normal.  Skin: Skin is warm and dry. No rash noted. She is not diaphoretic. No erythema. No pallor.  Psychiatric: She has a normal mood and affect. Her behavior is normal. Judgment and thought content  normal.     Assessment/Plan L4-5 grade 1 degenerative spondylolisthesis with stenosis and neurogenic claudication. Plan L4-5 decompressive laminectomy with foraminotomies followed by posterior lumbar interbody fusion utilizing tangent interbody allograft wedge Telamon interbody peek cage and local autograft. This be coupled with posterior lateral arthrodesis utilizing nonsegmental pedicle screw instrumentation and local autograft. Risks and benefits have been explained. Patient wishes to proceed.  Sara Stuart A 07/08/2012, 9:39 AM

## 2012-07-08 NOTE — Anesthesia Preprocedure Evaluation (Addendum)
Anesthesia Evaluation  Patient identified by MRN, date of birth, ID band Patient awake    Reviewed: Allergy & Precautions, H&P , NPO status , Patient's Chart, lab work & pertinent test results  Airway Mallampati: I TM Distance: >3 FB Neck ROM: full    Dental  (+) Edentulous Upper, Poor Dentition, Partial Lower and Dental Advisory Given   Pulmonary          Cardiovascular hypertension, Rhythm:regular Rate:Normal     Neuro/Psych  Neuromuscular disease    GI/Hepatic GERD-  ,  Endo/Other    Renal/GU      Musculoskeletal   Abdominal   Peds  Hematology   Anesthesia Other Findings Hx Polio  Reproductive/Obstetrics                          Anesthesia Physical Anesthesia Plan  ASA: II  Anesthesia Plan: General   Post-op Pain Management:    Induction: Intravenous  Airway Management Planned: Oral ETT  Additional Equipment:   Intra-op Plan:   Post-operative Plan: Extubation in OR  Informed Consent: I have reviewed the patients History and Physical, chart, labs and discussed the procedure including the risks, benefits and alternatives for the proposed anesthesia with the patient or authorized representative who has indicated his/her understanding and acceptance.     Plan Discussed with: CRNA, Anesthesiologist and Surgeon  Anesthesia Plan Comments:         Anesthesia Quick Evaluation

## 2012-07-08 NOTE — Anesthesia Postprocedure Evaluation (Signed)
  Anesthesia Post-op Note  Patient: Sara Stuart  Procedure(s) Performed: Procedure(s) with comments: POSTERIOR LUMBAR FUSION 1 LEVEL (N/A) - Lumbar four-five posterior lumbar interbody fusion  Patient Location: PACU  Anesthesia Type:General  Level of Consciousness: awake, oriented and patient cooperative  Airway and Oxygen Therapy: Patient Spontanous Breathing  Post-op Pain: moderate  Post-op Assessment: Post-op Vital signs reviewed, Patient's Cardiovascular Status Stable, Respiratory Function Stable, Patent Airway, No signs of Nausea or vomiting and Pain level controlled  Post-op Vital Signs: stable  Complications: No apparent anesthesia complications

## 2012-07-08 NOTE — Anesthesia Procedure Notes (Signed)
Procedure Name: Intubation Date/Time: 07/08/2012 9:54 AM Performed by: De Nurse Pre-anesthesia Checklist: Patient identified, Emergency Drugs available, Suction available, Patient being monitored and Timeout performed Patient Re-evaluated:Patient Re-evaluated prior to inductionOxygen Delivery Method: Circle system utilized Preoxygenation: Pre-oxygenation with 100% oxygen Intubation Type: IV induction Ventilation: Mask ventilation without difficulty Laryngoscope Size: Mac and 3 Grade View: Grade I Tube type: Oral Tube size: 7.0 mm Number of attempts: 1 Airway Equipment and Method: Stylet Placement Confirmation: ETT inserted through vocal cords under direct vision,  breath sounds checked- equal and bilateral and positive ETCO2 Secured at: 22 cm Tube secured with: Tape Dental Injury: Teeth and Oropharynx as per pre-operative assessment

## 2012-07-08 NOTE — Brief Op Note (Signed)
07/08/2012  12:41 PM  PATIENT:  Sara Stuart  57 y.o. female  PRE-OPERATIVE DIAGNOSIS:  radiculopathy  POST-OPERATIVE DIAGNOSIS:  radiculopathy   PROCEDURE:  Procedure(s) with comments: POSTERIOR LUMBAR FUSION 1 LEVEL (N/A) - Lumbar four-five posterior lumbar interbody fusion  SURGEON:  Surgeon(s) and Role:    * Temple Pacini, MD - Primary    * Reinaldo Meeker, MD - Assisting  PHYSICIAN ASSISTANT:   ASSISTANTS:    ANESTHESIA:   general  EBL:  Total I/O In: 1980 [I.V.:1800; Blood:180] Out: 625 [Urine:125; Blood:500]  BLOOD ADMINISTERED:none  DRAINS: (Medium) Hemovact drain(s) in the Epidural space with  Suction Open   LOCAL MEDICATIONS USED:  MARCAINE     SPECIMEN:  No Specimen  DISPOSITION OF SPECIMEN:  N/A  COUNTS:  YES  TOURNIQUET:  * No tourniquets in log *  DICTATION: .Dragon Dictation  PLAN OF CARE: Admit to inpatient   PATIENT DISPOSITION:  PACU - hemodynamically stable.   Delay start of Pharmacological VTE agent (>24hrs) due to surgical blood loss or risk of bleeding: yes

## 2012-07-09 MED ORDER — HYDROCODONE-ACETAMINOPHEN 10-325 MG PO TABS
1.0000 | ORAL_TABLET | Freq: Two times a day (BID) | ORAL | Status: DC
Start: 2012-07-09 — End: 2012-12-29

## 2012-07-09 MED ORDER — DIAZEPAM 5 MG PO TABS: 5.0000 mg | ORAL_TABLET | Freq: Four times a day (QID) | ORAL | Status: DC | PRN

## 2012-07-09 MED FILL — Sodium Chloride IV Soln 0.9%: INTRAVENOUS | Qty: 2000 | Status: AC

## 2012-07-09 MED FILL — Heparin Sodium (Porcine) Inj 1000 Unit/ML: INTRAMUSCULAR | Qty: 30 | Status: AC

## 2012-07-09 NOTE — Progress Notes (Signed)
Pt. Alert and oriented,follows simple instructions, denies pain. Incision area without swelling, redness or S/S of infection. Voiding adequate clear yellow urine. Moving all extremities well and vitals stable and documented. Script and Lumbar surgery notes instructions given to patient and family member for home safety and precautions. Pt. and family stated understanding of instructions given.

## 2012-07-09 NOTE — Discharge Summary (Signed)
Physician Discharge Summary  Patient ID: Shahla Betsill MRN: [id number] DOB/AGE: 03-10-1955 57 y.o.  Admit date: 07/08/2012 Discharge date: 07/09/2012  Admission Diagnoses:  Discharge Diagnoses:  Principal Problem:   Spinal stenosis, lumbar region, with neurogenic claudication Active Problems:   Degenerative spondylolisthesis   Discharged Condition: good  Hospital Course: Patient m admitted to the hospital where she underwent an uncomplicated L4-5 decompression and fusion with instrumentation. Postoperatively she is done very well. Back and lower extremity pain much improved. Ambulating with minimal difficulty. Ready for discharge home.  Consults:   Significant Diagnostic Studies:   Treatments:   Discharge Exam: Blood pressure 108/72, pulse 94, temperature 98.8 F (37.1 C), temperature source Oral, resp. rate 18, SpO2 94.00%. Awake and alert. Oriented and appropriate. Cranial nerve function intact. Motor examination with chronic stable right dorsiflexion weakness otherwise intact. Sensory intact. Wound clean and dry. Chest and abdomen benign.  Disposition: 01-Home or Self Care     Medication List         colchicine 0.6 MG tablet  Take 0.6 mg by mouth daily as needed (for gout pain).     cyclobenzaprine 10 MG tablet  Commonly known as:  FLEXERIL  Take 10 mg by mouth at bedtime as needed for muscle spasms.     diazepam 5 MG tablet  Commonly known as:  VALIUM  Take 1-2 tablets (5-10 mg total) by mouth every 6 (six) hours as needed.     diltiazem 120 MG 24 hr capsule  Commonly known as:  DILACOR XR  Take 120 mg by mouth daily.     HYDROcodone-acetaminophen 10-325 MG per tablet  Commonly known as:  NORCO  Take 1-2 tablets by mouth 2 (two) times daily.     metoCLOPramide 10 MG tablet  Commonly known as:  REGLAN  Take 1 tablet (10 mg total) by mouth every 6 (six) hours as needed (nausea).     omeprazole 40 MG capsule  Commonly known as:  PRILOSEC  Take 40 mg by mouth  daily.     pravastatin 40 MG tablet  Commonly known as:  PRAVACHOL  Take 40 mg by mouth daily.     traMADol 50 MG tablet  Commonly known as:  ULTRAM  Take 100 mg by mouth at bedtime as needed for pain.     triamterene-hydrochlorothiazide 37.5-25 MG per capsule  Commonly known as:  DYAZIDE  Take 1 capsule by mouth daily.           Follow-up Information   Follow up with Luke Falero A, MD. Call in 2 weeks. (ext [phone])    Contact information:   [address] [place] [address] [phone]       Signed: Kyandre Okray A 07/09/2012, 9:52 AM

## 2012-07-09 NOTE — Evaluation (Signed)
Occupational Therapy Evaluation Patient Details Name: Sara Stuart MRN: [id number] DOB: 09-04-1955 Today's Date: 07/09/2012 Time: 4098-1191 OT Time Calculation (min): 21 min  OT Assessment / Plan / Recommendation History of present illness     Clinical Impression   Pt s/p L4-L5 PLIF.  Pt performing functional mobility at overall supervision level.  Pt will have necessary level of assist at home. Education completed. No further acute OT needs. Will sign off.    OT Assessment  Patient does not need any further OT services    Follow Up Recommendations  No OT follow up;Supervision/Assistance - 24 hour    Barriers to Discharge      Equipment Recommendations   (RW)    Recommendations for Other Services    Frequency       Precautions / Restrictions Precautions Precautions: Back Precaution Comments: pt able to verbalize and demonstrate all back precautions Required Braces or Orthoses: Spinal Brace Spinal Brace: Lumbar corset;Applied in sitting position Restrictions Weight Bearing Restrictions: No   Pertinent Vitals/Pain See vitals    ADL  Upper Body Bathing: Simulated;Set up Where Assessed - Upper Body Bathing: Unsupported sitting Lower Body Bathing: Simulated;Minimal assistance Where Assessed - Lower Body Bathing: Unsupported sitting Upper Body Dressing: Simulated;Set up Where Assessed - Upper Body Dressing: Unsupported sitting Lower Body Dressing: Performed;Minimal assistance Where Assessed - Lower Body Dressing: Unsupported sitting Toilet Transfer: Simulated;Supervision/safety Toilet Transfer Method:  (ambulating) Toilet Transfer Equipment:  (bed) Equipment Used: Rolling walker;Back brace Transfers/Ambulation Related to ADLs: supervision with RW ADL Comments: Pt ambulating in hallway with daughter on OT arrival.  Pt states that she will have family assist with ADLs.    OT Diagnosis:    OT Problem List:   OT Treatment Interventions:     OT Goals(Current goals can be  found in the care plan section)    Visit Information  Last OT Received On: 07/09/12 Assistance Needed: +1       Prior Functioning     Home Living Family/patient expects to be discharged to:: Private residence Living Arrangements: Spouse/significant other Available Help at Discharge: Family;Available 24 hours/day Type of Home: House Home Access: Stairs to enter [employer] of Steps: 3 Entrance Stairs-Rails: Can reach both Home Layout: One level Home Equipment: Bedside commode Prior Function Level of Independence: Independent Communication Communication: No difficulties         Vision/Perception     Cognition  Cognition Arousal/Alertness: Awake/alert Behavior During Therapy: WFL for tasks assessed/performed Overall Cognitive Status: Within Functional Limits for tasks assessed    Extremity/Trunk Assessment Upper Extremity Assessment Upper Extremity Assessment: Overall WFL for tasks assessed Lower Extremity Assessment Lower Extremity Assessment: RLE deficits/detail RLE Deficits / Details: hx of polio affecting R LE, foot drop     Mobility Bed Mobility Bed Mobility: Not assessed Transfers Transfers: Stand to Sit Stand to Sit: 5: Supervision;To bed     Exercise     Balance     End of Session OT - End of Session Equipment Utilized During Treatment: Back brace Activity Tolerance: Patient tolerated treatment well Patient left: in bed;with call bell/phone within reach;with family/visitor present Nurse Communication: Mobility status  GO    07/09/2012 Cipriano Mile OTR/L Pager [phone] Office [phone]  Cipriano Mile 07/09/2012, 9:53 AM

## 2012-07-09 NOTE — Progress Notes (Signed)
UR COMPLETED  

## 2012-07-09 NOTE — Evaluation (Signed)
Physical Therapy One Time Evaluation Patient Details Name: Sara Stuart MRN: [id number] DOB: 22-Mar-1955 Today's Date: 07/09/2012 Time: 4098-1191 PT Time Calculation (min): 13 min  PT Assessment / Plan / Recommendation History of Present Illness     Clinical Impression  Pt is a 57 year old female s/p lumbar four-five posterior lumbar interbody fusion.  Reviewed log roll technique and back precautions with pt.  Pt also donned and doffed back brace appropriately.  Pt reports presurgical L LE pain no longer present.  Pt with hx of polio with R foot drop so discussed using AFO since pt reports tripping on R foot frequently.  Did not perform stairs however reviewed safe technique for 3 steps with 2 reachable rails into home going forward step to pattern.  All education has been completed and the patient has no further questions. No f/u PT however recommend RW upon d/c.  PT is signing off. Thank you for this referral.     PT Assessment  Patent does not need any further PT services    Follow Up Recommendations  No PT follow up    Does the patient have the potential to tolerate intense rehabilitation      Barriers to Discharge        Equipment Recommendations  Rolling walker with 5" wheels    Recommendations for Other Services     Frequency      Precautions / Restrictions Precautions Precautions: Back Precaution Comments: pt able to verbalize and demonstrate all back precautions Required Braces or Orthoses: Spinal Brace Spinal Brace: Lumbar corset;Applied in sitting position Restrictions Weight Bearing Restrictions: No   Pertinent Vitals/Pain Minimal back pain      Mobility  Bed Mobility Bed Mobility: Right Sidelying to Sit;Sit to Sidelying Right Right Sidelying to Sit: 5: Supervision;HOB flat Sit to Sidelying Right: 5: Supervision;HOB flat Details for Bed Mobility Assistance: verbal cues for log roll technique  Transfers Transfers: Sit to Stand;Stand to Sit Sit to Stand: 5:  Supervision;From bed Stand to Sit: 5: Supervision;To bed Ambulation/Gait Ambulation/Gait Assistance: 5: Supervision Ambulation Distance (Feet): 120 Feet Assistive device: Rolling walker Ambulation/Gait Assistance Details: pt requested use of RW and would like one for home to steady, discussed AFO for R ankle however pt reports she tried braces years ago but they did not help however she reports frequently tripping over dragging R foot so encouraged to visit prosthetist/orthotist  Gait velocity: decreased    Exercises     PT Diagnosis:    PT Problem List:   PT Treatment Interventions:       PT Goals(Current goals can be found in the care plan section) Acute Rehab PT Goals PT Goal Formulation: No goals set, d/c therapy  Visit Information  Last PT Received On: 07/09/12 Assistance Needed: +1       Prior Functioning  Home Living Family/patient expects to be discharged to:: Private residence Living Arrangements: Spouse/significant other Available Help at Discharge: Family;Available 24 hours/day Type of Home: House Home Access: Stairs to enter [employer] of Steps: 3 Entrance Stairs-Rails: Can reach both Home Layout: One level Home Equipment: Bedside commode Prior Function Level of Independence: Independent Communication Communication: No difficulties    Cognition  Cognition Arousal/Alertness: Awake/alert Behavior During Therapy: WFL for tasks assessed/performed Overall Cognitive Status: Within Functional Limits for tasks assessed    Extremity/Trunk Assessment Upper Extremity Assessment Upper Extremity Assessment: Overall WFL for tasks assessed Lower Extremity Assessment Lower Extremity Assessment: RLE deficits/detail RLE Deficits / Details: hx of polio affecting R LE,  foot drop   Balance    End of Session PT - End of Session Equipment Utilized During Treatment: Back brace Activity Tolerance: Patient tolerated treatment well Patient left: in bed;with  family/visitor present  GP     Sara Stuart,KATHrine E 07/09/2012, 10:01 AM Zenovia Jarred, PT, DPT 07/09/2012 Pager: [phone]

## 2012-12-29 ENCOUNTER — Emergency Department (HOSPITAL_COMMUNITY): Payer: Commercial Managed Care - PPO

## 2012-12-29 ENCOUNTER — Observation Stay (HOSPITAL_COMMUNITY)
Admission: EM | Admit: 2012-12-29 | Discharge: 2012-12-30 | Disposition: A | Discharge status: Home / Self Care | Payer: Commercial Managed Care - PPO | Attending: Internal Medicine | Admitting: Internal Medicine

## 2012-12-29 ENCOUNTER — Encounter (HOSPITAL_COMMUNITY): Payer: Self-pay | Admitting: Emergency Medicine

## 2012-12-29 DIAGNOSIS — M109 Gout, unspecified: Secondary | ICD-10-CM | POA: Insufficient documentation

## 2012-12-29 DIAGNOSIS — R1013 Epigastric pain: Secondary | ICD-10-CM

## 2012-12-29 DIAGNOSIS — B91 Sequelae of poliomyelitis: Secondary | ICD-10-CM | POA: Insufficient documentation

## 2012-12-29 DIAGNOSIS — I10 Essential (primary) hypertension: Secondary | ICD-10-CM | POA: Diagnosis present

## 2012-12-29 DIAGNOSIS — Z79899 Other long term (current) drug therapy: Secondary | ICD-10-CM | POA: Insufficient documentation

## 2012-12-29 DIAGNOSIS — M48062 Spinal stenosis, lumbar region with neurogenic claudication: Secondary | ICD-10-CM

## 2012-12-29 DIAGNOSIS — R7309 Other abnormal glucose: Secondary | ICD-10-CM | POA: Insufficient documentation

## 2012-12-29 DIAGNOSIS — M4310 Spondylolisthesis, site unspecified: Secondary | ICD-10-CM

## 2012-12-29 DIAGNOSIS — K219 Gastro-esophageal reflux disease without esophagitis: Secondary | ICD-10-CM | POA: Diagnosis present

## 2012-12-29 DIAGNOSIS — R0789 Other chest pain: Principal | ICD-10-CM | POA: Insufficient documentation

## 2012-12-29 DIAGNOSIS — Z8739 Personal history of other diseases of the musculoskeletal system and connective tissue: Secondary | ICD-10-CM | POA: Diagnosis present

## 2012-12-29 DIAGNOSIS — Z8612 Personal history of poliomyelitis: Secondary | ICD-10-CM | POA: Diagnosis present

## 2012-12-29 DIAGNOSIS — R079 Chest pain, unspecified: Secondary | ICD-10-CM | POA: Diagnosis present

## 2012-12-29 LAB — POCT I-STAT TROPONIN I: Troponin i, poc: 0 ng/mL (ref 0.00–0.08)

## 2012-12-29 LAB — COMPREHENSIVE METABOLIC PANEL
ALT: 34 U/L (ref 0–35)
AST: 45 U/L — ABNORMAL HIGH (ref 0–37)
Albumin: 3.9 g/dL (ref 3.5–5.2)
Alkaline Phosphatase: 124 U/L — ABNORMAL HIGH (ref 39–117)
BUN: 21 mg/dL (ref 6–23)
CO2: 25 mEq/L (ref 19–32)
Calcium: 9.6 mg/dL (ref 8.4–10.5)
Chloride: 98 mEq/L (ref 96–112)
Creatinine, Ser: 0.81 mg/dL (ref 0.50–1.10)
GFR calc Af Amer: >90 mL/min (ref >90)
GFR calc non Af Amer: 80 mL/min — ABNORMAL LOW (ref >90)
Glucose, Bld: 141 mg/dL — ABNORMAL HIGH (ref 70–99)
Potassium: 2.9 mEq/L — ABNORMAL LOW (ref 3.5–5.1)
Sodium: 137 mEq/L (ref 135–145)
Total Bilirubin: 0.5 mg/dL (ref 0.3–1.2)
Total Protein: 7.9 g/dL (ref 6.0–8.3)

## 2012-12-29 LAB — CREATININE, SERUM
Creatinine, Ser: 0.74 mg/dL (ref 0.50–1.10)
GFR calc Af Amer: >90 mL/min (ref >90)
GFR calc non Af Amer: >90 mL/min (ref >90)

## 2012-12-29 LAB — CBC
HCT: 42.5 % (ref 36.0–46.0)
HCT: 40.3 % (ref 36.0–46.0)
Hemoglobin: 14.6 g/dL (ref 12.0–15.0)
Hemoglobin: 13.5 g/dL (ref 12.0–15.0)
MCH: 27.6 pg (ref 26.0–34.0)
MCH: 27 pg (ref 26.0–34.0)
MCHC: 34.4 g/dL (ref 30.0–36.0)
MCHC: 33.5 g/dL (ref 30.0–36.0)
MCV: 80.3 fL (ref 78.0–100.0)
MCV: 80.6 fL (ref 78.0–100.0)
Platelets: 285 10*3/uL (ref 150–400)
Platelets: 298 10*3/uL (ref 150–400)
RBC: 5.29 MIL/uL — ABNORMAL HIGH (ref 3.87–5.11)
RBC: 5 MIL/uL (ref 3.87–5.11)
RDW: 13.8 % (ref 11.5–15.5)
RDW: 13.8 % (ref 11.5–15.5)
WBC: 12.3 10*3/uL — ABNORMAL HIGH (ref 4.0–10.5)
WBC: 9.9 10*3/uL (ref 4.0–10.5)

## 2012-12-29 LAB — TROPONIN I: Troponin I: <0.3 ng/mL (ref <0.30)

## 2012-12-29 LAB — PRO B NATRIURETIC PEPTIDE: Pro B Natriuretic peptide (BNP): 24.6 pg/mL (ref 0–125)

## 2012-12-29 LAB — LIPASE, BLOOD: Lipase: 22 U/L (ref 11–59)

## 2012-12-29 MED ORDER — ONDANSETRON HCL 4 MG/2ML IJ SOLN: 4.0000 mg | Freq: Three times a day (TID) | INTRAMUSCULAR | Status: DC | PRN

## 2012-12-29 MED ORDER — GI COCKTAIL ~~LOC~~
30.0000 mL | Freq: Four times a day (QID) | ORAL | Status: DC | PRN
  Filled 2012-12-29: qty 30

## 2012-12-29 MED ORDER — GI COCKTAIL ~~LOC~~
30.0000 mL | Freq: Once | ORAL | Status: AC
  Administered 2012-12-29: 30 mL via ORAL
  Filled 2012-12-29: qty 30

## 2012-12-29 MED ORDER — PANTOPRAZOLE SODIUM 40 MG PO TBEC
80.0000 mg | DELAYED_RELEASE_TABLET | Freq: Every day | ORAL | Status: DC
  Administered 2012-12-30: 80 mg via ORAL
  Filled 2012-12-29: qty 2

## 2012-12-29 MED ORDER — ENOXAPARIN SODIUM 40 MG/0.4ML ~~LOC~~ SOLN
40.0000 mg | SUBCUTANEOUS | Status: DC
  Administered 2012-12-29: 40 mg via SUBCUTANEOUS
  Filled 2012-12-29 (×2): qty 0.4

## 2012-12-29 MED ORDER — POTASSIUM CHLORIDE 20 MEQ/15ML (10%) PO LIQD
40.0000 meq | Freq: Once | ORAL | Status: AC
  Administered 2012-12-29: 40 meq via ORAL
  Filled 2012-12-29: qty 30

## 2012-12-29 MED ORDER — TRIAMTERENE-HCTZ 37.5-25 MG PO CAPS
1.0000 | ORAL_CAPSULE | Freq: Every day | ORAL | Status: DC
  Administered 2012-12-30: 1 via ORAL
  Filled 2012-12-29: qty 1

## 2012-12-29 MED ORDER — HYDROMORPHONE HCL PF 1 MG/ML IJ SOLN: 1.0000 mg | INTRAMUSCULAR | Status: AC | PRN

## 2012-12-29 MED ORDER — MORPHINE SULFATE 4 MG/ML IJ SOLN: 4.0000 mg | Freq: Once | INTRAMUSCULAR | Status: DC

## 2012-12-29 MED ORDER — MORPHINE SULFATE 4 MG/ML IJ SOLN
4.0000 mg | Freq: Once | INTRAMUSCULAR | Status: AC
  Administered 2012-12-29: 4 mg via INTRAVENOUS
  Filled 2012-12-29: qty 1

## 2012-12-29 MED ORDER — ACETAMINOPHEN 325 MG PO TABS: 650.0000 mg | ORAL_TABLET | ORAL | Status: DC | PRN

## 2012-12-29 MED ORDER — MORPHINE SULFATE 2 MG/ML IJ SOLN: 2.0000 mg | INTRAMUSCULAR | Status: DC | PRN

## 2012-12-29 MED ORDER — DILTIAZEM HCL ER 120 MG PO CP24
120.0000 mg | ORAL_CAPSULE | Freq: Every day | ORAL | Status: DC
  Administered 2012-12-30: 120 mg via ORAL
  Filled 2012-12-29: qty 1

## 2012-12-29 MED ORDER — ONDANSETRON HCL 4 MG/2ML IJ SOLN: 4.0000 mg | Freq: Four times a day (QID) | INTRAMUSCULAR | Status: DC | PRN

## 2012-12-29 MED ORDER — ASPIRIN EC 325 MG PO TBEC
325.0000 mg | DELAYED_RELEASE_TABLET | Freq: Every day | ORAL | Status: DC
  Administered 2012-12-29 – 2012-12-30 (×2): 325 mg via ORAL
  Filled 2012-12-29 (×2): qty 1

## 2012-12-29 NOTE — H&P (Addendum)
[facility] History and Physical  Sara Stuart NWG:[id number] DOB: [birth year] DOA: 12/29/2012  Referring physician: Rochele Raring, ER physician PCP: Louie Boston, MD   Chief Complaint: Chest pain  HPI: Sara Stuart is a 57 y.o. female  Past medical history of morbid obesity, hypertension, and GERD who for the last month has noted decreasing exercise tolerance or even walking to the mailbox and back aches are clear short of breath. No associated cough with this. Patient started having this morning severe midepigastric pain which also radiated around to her left breast. She also felt like he went into her back she describes pain as sharp as it being stabbed with a knife and twisting. She also started having diaphoresis. This persisted and she came to the emergency room. There she was given nitroglycerin and morphine and her symptoms started subside. . She denies any shortness of breath. Pain did not radiate anywhere else.  EKG was nonspecific. Troponin was normal. Other lab work was unremarkable. Chest x-ray was unremarkable. They did check an abdominal ultrasound which noted a fatty liver and cholelithiasis without evidence of gallbladder obstruction. Hospitals were called for further evaluation.   Review of Systems:  Patient seen after arrival to floor. Feeling a little better as this pain she is having has eased off down to a 1-2/10. Denies any headaches, vision changes, dysphasia, palpitations, shortness of breath, wheeze, cough, abdominal pain, hematuria, dysuria, constipation, diarrhea, focal extremity numbness or weakness or pain. Review of systems otherwise negative.  Past Medical History  Diagnosis Date  . Polio   . Hypertension   . Complication of anesthesia   . PONV (postoperative nausea and vomiting)   . GERD (gastroesophageal reflux disease)   . Arthritis    Past Surgical History  Procedure Laterality Date  . Abdominal hysterectomy      TUBAL PREGNANCY  . Back surgery   july 2014  . Foot surgery      more than 5 surgeries on right foot related to polio   Social History:  reports that she has never smoked. She has never used smokeless tobacco. She reports that she does not drink alcohol or use illicit drugs. patient lives at home with her husband. Because of her history of polio, she is able to ambulate using a cane  No Known Allergies  Family history: Dad with previous history of heart attack and status post stent placement  Prior to Admission medications   Medication Sig Start Date End Date Taking? Authorizing Provider  colchicine 0.6 MG tablet Take 0.6 mg by mouth daily as needed (for gout pain).    Yes Historical Provider, MD  omeprazole (PRILOSEC) 40 MG capsule Take 40 mg by mouth daily.   Yes Historical Provider, MD  triamterene-hydrochlorothiazide (DYAZIDE) 37.5-25 MG per capsule Take 1 capsule by mouth daily.   Yes Historical Provider, MD  diltiazem (DILACOR XR) 120 MG 24 hr capsule Take 120 mg by mouth daily.    Historical Provider, MD   Physical Exam: Filed Vitals:   12/29/12 1833  BP: 134/95  Pulse: 56  Temp: 97.6 F (36.4 C)  Resp: 18    BP 134/95  Pulse 56  Temp(Src) 97.6 F (36.4 C) (Oral)  Resp 18  Ht 5' (1.524 m)  Wt 97.024 kg (213 lb 14.4 oz)  BMI 41.77 kg/m2  SpO2 96%  General:  Appears calm and comfortable, no acute distress Eyes: Sclera nonicteric, extraocular movements are intact ENT: Normocephalic and atraumatic mucous membranes are slightly dry Neck: Thick, no  JVD Cardiovascular: Regular rate and rhythm, S1-S2  Respiratory: Clear to auscultation bilaterally, decreased breath sounds throughout secondary to body habitus Abdomen: soft, obese, nontender, positive bowel sounds Skin: No skin breaks, tears or lesions Musculoskeletal: No clubbing or cyanosis or edema Psychiatric: Patient is appropriate, no evidence of psychoses Neurologic: No focal deficits           Labs on Admission:  Basic Metabolic  Panel:  Recent Labs Lab 12/29/12 1400  NA 137  K 2.9*  CL 98  CO2 25  GLUCOSE 141*  BUN 21  CREATININE 0.81  CALCIUM 9.6   Liver Function Tests:  Recent Labs Lab 12/29/12 1400  AST 45*  ALT 34  ALKPHOS 124*  BILITOT 0.5  PROT 7.9  ALBUMIN 3.9    Recent Labs Lab 12/29/12 1400  LIPASE 22   No results found for this basename: AMMONIA,  in the last 168 hours CBC:  Recent Labs Lab 12/29/12 1400  WBC 12.3*  HGB 14.6  HCT 42.5  MCV 80.3  PLT 285   Cardiac Enzymes: No results found for this basename: CKTOTAL, CKMB, CKMBINDEX, TROPONINI,  in the last 168 hours  BNP (last 3 results) No results found for this basename: PROBNP,  in the last 8760 hours CBG: No results found for this basename: GLUCAP,  in the last 168 hours  Radiological Exams on Admission: Dg Chest 2 View  12/29/2012   CLINICAL DATA:  Chest pain  EXAM: CHEST  2 VIEW  COMPARISON:  06/26/2012  FINDINGS: Cardiomediastinal silhouette is stable. No acute infiltrate or pleural effusion. No pulmonary edema. Mild degenerative changes thoracic spine.  IMPRESSION: No active cardiopulmonary disease.   Electronically Signed   By: Natasha Mead M.D.   On: 12/29/2012 16:21   US Abdomen Complete  12/29/2012   CLINICAL DATA:  Right upper quadrant epigastric pain.  EXAM: ULTRASOUND ABDOMEN COMPLETE  COMPARISON:  12/18/2011  FINDINGS: Gallbladder:  Multiple gallstones, largest 1.5 cm. No gallbladder wall thickening. Sonographic Murphy's sign absent.  Common bile duct:  Diameter: 5 mm, within normal limits  Liver:  Echodense hepatic parenchyma compatible with hepatic steatosis.  IVC:  Unremarkable  Pancreas:  Visualized portion a pancreatic body unremarkable. Pancreatic head and tail obscured by bowel gas.  Spleen:  Size and appearance within normal limits.  Right Kidney:  Length: 10.2 cm. Echogenicity within normal limits. No mass or hydronephrosis visualized.  Left Kidney:  Length: 12.0 cm. Echogenicity within normal  limits. No mass or hydronephrosis visualized. Dromedary hump noted.  Abdominal aorta:  No aneurysm visualized.  Other findings:  None.  IMPRESSION: 1. Chololithiasis. 2. Hepatic steatosis. 3. Nonvisualization of pancreatic head and tail due to overlying bowel gas.   Electronically Signed   By: Herbie Baltimore M.D.   On: 12/29/2012 16:05    EKG: Independently reviewed. EKG was reviewed by ER physician. Is currently not showing up of her chronic medical record. It shows normal sinus rhythm without any abnormalities. Unchanged from previous.  Assessment/Plan Principal Problem:   Chest pain: Very atypical. More likely GI, the patient does have risk factors. Cycle troponin x3. Keep on telemetry. Given complaints of decreased exercise tolerance, we'll check BNP and echocardiogram. If all tests are normal, discharge home tomorrow Active Problems:   GERD (gastroesophageal reflux disease)   History of gout Hypertension: Continue home meds Morbid obesity: BMI calculated to be 41 History of polio    Code Status: Full code Family Communication: Husband not present, left a message Disposition Plan: Home  likely tomorrow  Time spent: 35 minutes  Hollice Espy [facility] Pager [phone]

## 2012-12-29 NOTE — ED Provider Notes (Signed)
TIME SEEN: 1:55 PM  CHIEF COMPLAINT: Chest pain, shortness of breath, nausea, diaphoresis  HPI: Patient is a 57 y.o. female with a history of hypertension who presents to the emergency department with epigastric pain and chest pain that started at 12:30 this afternoon. She states it was while she was at rest at work. She began to feel very nauseous, diaphoretic and slightly short of breath. She states she took Prilosec with no relief. Patient was also given aspirin and nitroglycerin by EMS which did not relieve her pain. She denies any prior cardiac history. No history of stress test or cardiac catheterization. No prior history of PE or DVT. No recent prolonged immobilization such as hospitalization or long flight, fracture, surgery, trauma, exogenous estrogen use or history of cancer. She denies any fever or cough. Denies her pain is worse with eating. States this feels different than her acid reflux.  ROS: See HPI Constitutional: no fever  Eyes: no drainage  ENT: no runny nose   Cardiovascular:   chest pain  Resp:  SOB  GI: no vomiting GU: no dysuria Integumentary: no rash  Allergy: no hives  Musculoskeletal: no leg swelling  Neurological: no slurred speech ROS otherwise negative  PAST MEDICAL HISTORY/PAST SURGICAL HISTORY:  Past Medical History  Diagnosis Date  . Polio   . Hypertension   . Complication of anesthesia   . PONV (postoperative nausea and vomiting)   . GERD (gastroesophageal reflux disease)   . Arthritis     MEDICATIONS:  Prior to Admission medications   Medication Sig Start Date End Date Taking? Authorizing Provider  colchicine 0.6 MG tablet Take 0.6 mg by mouth daily as needed (for gout pain).    Yes Historical Provider, MD  omeprazole (PRILOSEC) 40 MG capsule Take 40 mg by mouth daily.   Yes Historical Provider, MD  triamterene-hydrochlorothiazide (DYAZIDE) 37.5-25 MG per capsule Take 1 capsule by mouth daily.   Yes Historical Provider, MD  diltiazem (DILACOR  XR) 120 MG 24 hr capsule Take 120 mg by mouth daily.    Historical Provider, MD    ALLERGIES:  No Known Allergies  SOCIAL HISTORY:  History  Substance Use Topics  . Smoking status: Never Smoker   . Smokeless tobacco: Not on file  . Alcohol Use: No    FAMILY HISTORY: No family history on file. Patient's father had coronary artery disease in his 1s. EXAM: BP 132/76  Pulse 54  Temp(Src) 98 F (36.7 C) (Oral)  Resp 17  SpO2 94% CONSTITUTIONAL: Alert and oriented and responds appropriately to questions. Well-appearing; well-nourished HEAD: Normocephalic EYES: Conjunctivae clear, PERRL ENT: normal nose; no rhinorrhea; moist mucous membranes; pharynx without lesions noted NECK: Supple, no meningismus, no LAD  CARD: RRR; S1 and S2 appreciated; no murmurs, no clicks, no rubs, no gallops RESP: Normal chest excursion without splinting or tachypnea; breath sounds clear and equal bilaterally; no wheezes, no rhonchi, no rales,  ABD/GI: Normal bowel sounds; non-distended; soft, tender to palpation in her epigastric region without rebound or guarding, no peritoneal signs, negative Murphy sign BACK:  The back appears normal and is non-tender to palpation, there is no CVA tenderness EXT: Normal ROM in all joints; non-tender to palpation; no edema; normal capillary refill; no cyanosis    SKIN: Normal color for age and race; warm NEURO: Moves all extremities equally PSYCH: The patient's mood and manner are appropriate. Grooming and personal hygiene are appropriate.  MEDICAL DECISION MAKING: Patient with epigastric pain and chest pain that started at 12:30  today. She is hemodynamically stable. Her only risk factor for coronary artery disease is hypertension. She does have a story. Her EKG shows no ischemic changes. We'll obtain cardiac labs, abdominal labs and abdominal ultrasound. Will give pain medication and reassess.  ED PROGRESS: Patient's labs show mild leukocytosis and hypokalemia with  potassium of 2.9.  Will replace. Troponin negative. Chest x-ray clear. Ultrasound shows cholelithiasis with no signs of cholecystitis or choledocholithiasis. Patient reports she has only had very mild improvement in pain after GI cocktail. She states that one nitroglycerin with EMS did not help with her pain either. Will give morphine and reassess. Given her history of obesity, her age, family history and concerning story, have recommended admission for ACS rule out. Her primary care physician is Dr. Margo Common in [facility].  5:00 PM  Spoke with Dr. Rito Ehrlich for admission.      Date: 12/29/2012 13:41  Rate: 60  Rhythm: normal sinus rhythm  QRS Axis: normal  Intervals: normal  ST/T Wave abnormalities: normal  Conduction Disutrbances: none  Narrative Interpretation: unremarkable; unchanged compared to prior EKG in 2013, no new ischemic changes      Layla Maw Ryu Cerreta, DO 12/29/12 1709

## 2012-12-29 NOTE — ED Notes (Signed)
Bed: ZO10 Expected date:  Expected time:  Means of arrival:  Comments: EMS-epigastric pain

## 2012-12-29 NOTE — ED Notes (Addendum)
Per EMS, pt complains of 7/10 epigastric pain that radiates to back since 1230PM, which started at work. Pt states she felt diaphoretic, short of breath, weak, and nauseas. Pt states she has hx of acid reflux. Pt took prilosec, which did not provide relief. Pt given aspirin and nitro by EMS, which did not relieve pain.

## 2012-12-30 DIAGNOSIS — R079 Chest pain, unspecified: Secondary | ICD-10-CM

## 2012-12-30 DIAGNOSIS — R1013 Epigastric pain: Secondary | ICD-10-CM

## 2012-12-30 DIAGNOSIS — I517 Cardiomegaly: Secondary | ICD-10-CM

## 2012-12-30 DIAGNOSIS — I10 Essential (primary) hypertension: Secondary | ICD-10-CM

## 2012-12-30 DIAGNOSIS — K219 Gastro-esophageal reflux disease without esophagitis: Secondary | ICD-10-CM

## 2012-12-30 LAB — BASIC METABOLIC PANEL WITH GFR
BUN: 16 mg/dL (ref 6–23)
CO2: 25 meq/L (ref 19–32)
Calcium: 8.9 mg/dL (ref 8.4–10.5)
Chloride: 103 meq/L (ref 96–112)
Creatinine, Ser: 0.76 mg/dL (ref 0.50–1.10)
GFR calc Af Amer: >90 mL/min
GFR calc non Af Amer: >90 mL/min
Glucose, Bld: 150 mg/dL — ABNORMAL HIGH (ref 70–99)
Potassium: 3.4 meq/L — ABNORMAL LOW (ref 3.7–5.3)
Sodium: 140 meq/L (ref 137–147)

## 2012-12-30 LAB — TROPONIN I
Troponin I: <0.3 ng/mL (ref <0.30)
Troponin I: <0.3 ng/mL (ref <0.30)

## 2012-12-30 LAB — HEMOGLOBIN A1C
Hgb A1c MFr Bld: 6.2 % — ABNORMAL HIGH (ref <5.7)
Mean Plasma Glucose: 131 mg/dL — ABNORMAL HIGH (ref <117)

## 2012-12-30 MED ORDER — UNABLE TO FIND: Status: DC

## 2012-12-30 MED ORDER — POTASSIUM CHLORIDE CRYS ER 20 MEQ PO TBCR
20.0000 meq | EXTENDED_RELEASE_TABLET | Freq: Once | ORAL | Status: DC
  Filled 2012-12-30: qty 1

## 2012-12-30 MED ORDER — ASPIRIN EC 81 MG PO TBEC: 81.0000 mg | DELAYED_RELEASE_TABLET | Freq: Every day | ORAL | Status: AC

## 2012-12-30 NOTE — Progress Notes (Signed)
Echocardiogram 2D Echocardiogram has been performed.  Sara Stuart 12/30/2012, 3:30 PM

## 2012-12-30 NOTE — Discharge Summary (Signed)
Physician Discharge Summary  Sara Stuart ZOX:[id number] DOB: Nov 06, 1955 DOA: 12/29/2012  PCP: Louie Boston, MD  Admit date: 12/29/2012 Discharge date: 12/30/2012  Recommendations for Outpatient Follow-up:  1. Pt will need to follow up with PCP in 2 weeks post discharge 2. Please obtain BMP to evaluate electrolytes and kidney function 3. Please also check CBC to evaluate Hg and Hct levels   Discharge Diagnoses:  Principal Problem:   Chest pain Active Problems:   GERD (gastroesophageal reflux disease)   History of gout   HTN (hypertension)   History of post-polio syndrome   Morbid obesity  atypical chest pain -Troponins negative x3 -EKG with nonspecific changes -Chest pain has resolved -May be related to gastroesophageal reflux as well as anxiety which the patient and her husband relates a history of -BNP 24 -Echocardiogram showed EF 55-60%, grade 1 diastolic dysfunction, no wall motion abnormality -Patient needs followup with primary care physician For possible stress test in the future  GERD -Continue PPI -The patient was only taking it intermittently at home -I have advised patient to take it on a daily basis Hypertension -Controlled on home medications -Continue home medications follow up with primary care physician Morbid obesity -With some modification discussed Impaired glucose tolerance -Lifestyle modification -Patient will need close follow up with her primary care physician Discharge Condition: Stable  Disposition:  discharge home  Diet: Heart healthy Wt Readings from Last 3 Encounters:  12/29/12 97.024 kg (213 lb 14.4 oz)  06/26/12 110.899 kg (244 lb 7.8 oz)  12/18/11 108.863 kg (240 lb)    History of present illness:  57 year old female with a history of hypertension, gastroesophageal reflux disease, and spinal stenosis status post decompressive laminectomy on 07/08/2012 presented with chest discomfort while she was at work. The chest discomfort and  epigastric pain developed while she was working at her desk. This was associated with some diaphoresis. She also had 2 episodes of dry heaves after the chest pain. The patient was admitted for further workup and observation. Troponins have been negative x3. ProBNP was 24. EKG was sinus rhythm with nonspecific ST changes. The patient's chest discomfort resolved and did not recur through the rest of the hospitalization. The patient was given a cardiac diet she was able to tolerate it without any further vomiting. She also complained of some decreased exercise tolerance over the last 6 months with some dyspnea walking to her mailbox. However she has not had any prior chest discomfort, dizziness, palpitations, or syncope. There is no reports of orthopnea, PND. She has never smoked. There was no fevers, chills, nausea, vomiting, sore throat, headache. The patient remained afebrile and hemodynamically stable throughout the hospitalization. She was placed back on her diltiazem and Dyazide and her blood pressure remained stable. She was noted to be hypokalemic which was repleted echocardiogram was obtained.The patient was instructed to followup with her primary care provider within the patient has an appt on 01/05/2013. She may need further testing.      Discharge Exam: Filed Vitals:   12/30/12 0623  BP: 123/74  Pulse: 64  Temp: 98.2 F (36.8 C)  Resp: 16   Filed Vitals:   12/29/12 1833 12/29/12 2127 12/30/12 0100 12/30/12 0623  BP: 134/95 123/73 125/78 123/74  Pulse: 56 64 57 64  Temp: 97.6 F (36.4 C) 98.4 F (36.9 C) 98.4 F (36.9 C) 98.2 F (36.8 C)  TempSrc: Oral Oral Oral Oral  Resp: 18 18 18 16   Height: 5' (1.524 m)     Weight: 97.024  kg (213 lb 14.4 oz)     SpO2: 96% 98% 96% 98%   General: A&O x 3, NAD, pleasant, cooperative Cardiovascular: RRR, no rub, no gallop, no S3 Respiratory: CTAB, no wheeze, no rhonchi Abdomen:soft, nontender, nondistended, positive bowel sounds Extremities:  trace edema, No lymphangitis, no petechiae  Discharge Instructions     Medication List    ASK your doctor about these medications       colchicine 0.6 MG tablet  Take 0.6 mg by mouth daily as needed (for gout pain).     diltiazem 120 MG 24 hr capsule  Commonly known as:  DILACOR XR  Take 120 mg by mouth daily.     omeprazole 40 MG capsule  Commonly known as:  PRILOSEC  Take 40 mg by mouth daily.     triamterene-hydrochlorothiazide 37.5-25 MG per capsule  Commonly known as:  DYAZIDE  Take 1 capsule by mouth daily.         The results of significant diagnostics from this hospitalization (including imaging, microbiology, ancillary and laboratory) are listed below for reference.    Significant Diagnostic Studies: Dg Chest 2 View  12/29/2012   CLINICAL DATA:  Chest pain  EXAM: CHEST  2 VIEW  COMPARISON:  06/26/2012  FINDINGS: Cardiomediastinal silhouette is stable. No acute infiltrate or pleural effusion. No pulmonary edema. Mild degenerative changes thoracic spine.  IMPRESSION: No active cardiopulmonary disease.   Electronically Signed   By: Natasha Mead M.D.   On: 12/29/2012 16:21   US Abdomen Complete  12/29/2012   CLINICAL DATA:  Right upper quadrant epigastric pain.  EXAM: ULTRASOUND ABDOMEN COMPLETE  COMPARISON:  12/18/2011  FINDINGS: Gallbladder:  Multiple gallstones, largest 1.5 cm. No gallbladder wall thickening. Sonographic Murphy's sign absent.  Common bile duct:  Diameter: 5 mm, within normal limits  Liver:  Echodense hepatic parenchyma compatible with hepatic steatosis.  IVC:  Unremarkable  Pancreas:  Visualized portion a pancreatic body unremarkable. Pancreatic head and tail obscured by bowel gas.  Spleen:  Size and appearance within normal limits.  Right Kidney:  Length: 10.2 cm. Echogenicity within normal limits. No mass or hydronephrosis visualized.  Left Kidney:  Length: 12.0 cm. Echogenicity within normal limits. No mass or hydronephrosis visualized. Dromedary hump  noted.  Abdominal aorta:  No aneurysm visualized.  Other findings:  None.  IMPRESSION: 1. Chololithiasis. 2. Hepatic steatosis. 3. Nonvisualization of pancreatic head and tail due to overlying bowel gas.   Electronically Signed   By: Herbie Baltimore M.D.   On: 12/29/2012 16:05     Microbiology: No results found for this or any previous visit (from the past 240 hour(s)).   Labs: Basic Metabolic Panel:  Recent Labs Lab 12/29/12 1400 12/29/12 2048  NA 137  --   K 2.9*  --   CL 98  --   CO2 25  --   GLUCOSE 141*  --   BUN 21  --   CREATININE 0.81 0.74  CALCIUM 9.6  --    Liver Function Tests:  Recent Labs Lab 12/29/12 1400  AST 45*  ALT 34  ALKPHOS 124*  BILITOT 0.5  PROT 7.9  ALBUMIN 3.9    Recent Labs Lab 12/29/12 1400  LIPASE 22   No results found for this basename: AMMONIA,  in the last 168 hours CBC:  Recent Labs Lab 12/29/12 1400 12/29/12 2048  WBC 12.3* 9.9  HGB 14.6 13.5  HCT 42.5 40.3  MCV 80.3 80.6  PLT 285 298   Cardiac Enzymes:  Recent Labs Lab 12/29/12 2048 12/30/12 0140 12/30/12 0751  TROPONINI <0.30 <0.30 <0.30   BNP: No components found with this basename: POCBNP,  CBG: No results found for this basename: GLUCAP,  in the last 168 hours  Time coordinating discharge:  Greater than 30 minutes  Signed:  Lige Lakeman, DO [facility] Pager: [phone] 12/30/2012, 9:50 AM

## 2014-09-20 ENCOUNTER — Other Ambulatory Visit: Payer: Self-pay | Admitting: Neurosurgery

## 2014-09-20 DIAGNOSIS — M4310 Spondylolisthesis, site unspecified: Secondary | ICD-10-CM

## 2014-09-21 ENCOUNTER — Ambulatory Visit
Admission: RE | Admit: 2014-09-21 | Discharge: 2014-09-21 | Disposition: A | Discharge status: Home / Self Care | Payer: Commercial Managed Care - PPO | Source: Ambulatory Visit | Attending: Neurosurgery | Admitting: Neurosurgery

## 2014-09-21 DIAGNOSIS — M4310 Spondylolisthesis, site unspecified: Secondary | ICD-10-CM

## 2014-09-30 ENCOUNTER — Other Ambulatory Visit (HOSPITAL_COMMUNITY): Payer: Self-pay | Admitting: Neurosurgery

## 2014-11-29 ENCOUNTER — Encounter (HOSPITAL_COMMUNITY)
Admission: RE | Admit: 2014-11-29 | Discharge: 2014-11-29 | Disposition: A | Discharge status: Home / Self Care | Payer: Commercial Managed Care - PPO | Source: Ambulatory Visit | Attending: Neurosurgery | Admitting: Neurosurgery

## 2014-11-29 ENCOUNTER — Encounter (HOSPITAL_COMMUNITY): Payer: Self-pay

## 2014-11-29 DIAGNOSIS — Z01818 Encounter for other preprocedural examination: Secondary | ICD-10-CM | POA: Diagnosis present

## 2014-11-29 DIAGNOSIS — M4310 Spondylolisthesis, site unspecified: Secondary | ICD-10-CM | POA: Insufficient documentation

## 2014-11-29 DIAGNOSIS — E119 Type 2 diabetes mellitus without complications: Secondary | ICD-10-CM | POA: Diagnosis not present

## 2014-11-29 DIAGNOSIS — Z7984 Long term (current) use of oral hypoglycemic drugs: Secondary | ICD-10-CM | POA: Insufficient documentation

## 2014-11-29 DIAGNOSIS — Z79899 Other long term (current) drug therapy: Secondary | ICD-10-CM | POA: Diagnosis not present

## 2014-11-29 DIAGNOSIS — K219 Gastro-esophageal reflux disease without esophagitis: Secondary | ICD-10-CM | POA: Insufficient documentation

## 2014-11-29 DIAGNOSIS — Z01812 Encounter for preprocedural laboratory examination: Secondary | ICD-10-CM | POA: Diagnosis not present

## 2014-11-29 DIAGNOSIS — R9431 Abnormal electrocardiogram [ECG] [EKG]: Secondary | ICD-10-CM | POA: Insufficient documentation

## 2014-11-29 DIAGNOSIS — Z0183 Encounter for blood typing: Secondary | ICD-10-CM | POA: Diagnosis not present

## 2014-11-29 DIAGNOSIS — I10 Essential (primary) hypertension: Secondary | ICD-10-CM | POA: Insufficient documentation

## 2014-11-29 DIAGNOSIS — Z7982 Long term (current) use of aspirin: Secondary | ICD-10-CM | POA: Diagnosis not present

## 2014-11-29 DIAGNOSIS — Z981 Arthrodesis status: Secondary | ICD-10-CM | POA: Insufficient documentation

## 2014-11-29 HISTORY — DX: Dorsalgia, unspecified: M54.9

## 2014-11-29 HISTORY — DX: Type 2 diabetes mellitus without complications: E11.9

## 2014-11-29 HISTORY — DX: Gout, unspecified: M10.9

## 2014-11-29 HISTORY — DX: Nocturia: R35.1

## 2014-11-29 HISTORY — DX: Other chronic pain: G89.29

## 2014-11-29 LAB — CBC WITH DIFFERENTIAL/PLATELET
Basophils Absolute: 0 10*3/uL (ref 0.0–0.1)
Basophils Relative: 0 %
Eosinophils Absolute: 0.3 10*3/uL (ref 0.0–0.7)
Eosinophils Relative: 3 %
HCT: 44.3 % (ref 36.0–46.0)
Hemoglobin: 15.1 g/dL — ABNORMAL HIGH (ref 12.0–15.0)
Lymphocytes Relative: 36 %
Lymphs Abs: 3.8 10*3/uL (ref 0.7–4.0)
MCH: 28.3 pg (ref 26.0–34.0)
MCHC: 34.1 g/dL (ref 30.0–36.0)
MCV: 83.1 fL (ref 78.0–100.0)
Monocytes Absolute: 0.6 10*3/uL (ref 0.1–1.0)
Monocytes Relative: 5 %
Neutro Abs: 6 10*3/uL (ref 1.7–7.7)
Neutrophils Relative %: 56 %
Platelets: 254 10*3/uL (ref 150–400)
RBC: 5.33 MIL/uL — ABNORMAL HIGH (ref 3.87–5.11)
RDW: 12.6 % (ref 11.5–15.5)
WBC: 10.6 10*3/uL — ABNORMAL HIGH (ref 4.0–10.5)

## 2014-11-29 LAB — BASIC METABOLIC PANEL
Anion gap: 11 (ref 5–15)
BUN: 14 mg/dL (ref 6–20)
CO2: 25 mmol/L (ref 22–32)
Calcium: 9.3 mg/dL (ref 8.9–10.3)
Chloride: 96 mmol/L — ABNORMAL LOW (ref 101–111)
Creatinine, Ser: 0.66 mg/dL (ref 0.44–1.00)
GFR calc Af Amer: >60 mL/min (ref >60)
GFR calc non Af Amer: >60 mL/min (ref >60)
Glucose, Bld: 328 mg/dL — ABNORMAL HIGH (ref 65–99)
Potassium: 3.5 mmol/L (ref 3.5–5.1)
Sodium: 132 mmol/L — ABNORMAL LOW (ref 135–145)

## 2014-11-29 LAB — TYPE AND SCREEN
ABO/RH(D): O POS
Antibody Screen: NEGATIVE

## 2014-11-29 LAB — SURGICAL PCR SCREEN
MRSA, PCR: NEGATIVE
Staphylococcus aureus: POSITIVE — AB

## 2014-11-29 LAB — GLUCOSE, CAPILLARY: Glucose-Capillary: 292 mg/dL — ABNORMAL HIGH (ref 65–99)

## 2014-11-29 NOTE — Pre-Procedure Instructions (Signed)
Mallie MusselGracie Huffaker  11/29/2014      [id number] - [place],  - [address] [place] [id number] Phone: [phone] Fax: [phone]    Your procedure is scheduled on Mon, Dec 5 @ 10:00 AM  [facility] Admitting at 8:00 AM   Call this number if you have problems the morning of surgery:  [phone]   Remember:  Do not eat food or drink liquids after midnight.  Take these medicines the morning of surgery with A SIP OF WATER Colchicine(if needed),Diltiazem(Dilacor),and Omeprazole(Prilosec)             Stop taking your Aspirin and Ibuprofen. No Goody's,BC's,Aleve,Advil,Motrin,Fish Oil,or any Herbal Medications.    Do not wear jewelry, make-up or nail polish.  Do not wear lotions, powders, or perfumes.  You may wear deodorant.  Do not shave 48 hours prior to surgery.    Do not bring valuables to the hospital.  [facility] is not responsible for any belongings or valuables.  Contacts, dentures or bridgework may not be worn into surgery.  Leave your suitcase in the car.  After surgery it may be brought to your room.  For patients admitted to the hospital, discharge time will be determined by your treatment team.  Patients discharged the day of surgery will not be allowed to drive home.    Special instructions:  [place] - Preparing for Surgery  Before surgery, you can play an important role.  Because skin is not sterile, your skin needs to be as free of germs as possible.  You can reduce the number of germs on you skin by washing with CHG (chlorahexidine gluconate) soap before surgery.  CHG is an antiseptic cleaner which kills germs and bonds with the skin to continue killing germs even after washing.  Please DO NOT use if you have an allergy to CHG or antibacterial soaps.  If your skin becomes reddened/irritated stop using the CHG and inform your nurse when you arrive at Short Stay.  Do not shave (including legs and  underarms) for at least 48 hours prior to the first CHG shower.  You may shave your face.  Please follow these instructions carefully:   1.  Shower with CHG Soap the night before surgery and the                                morning of Surgery.  2.  If you choose to wash your hair, wash your hair first as usual with your       normal shampoo.  3.  After you shampoo, rinse your hair and body thoroughly to remove the                      Shampoo.  4.  Use CHG as you would any other liquid soap.  You can apply chg directly       to the skin and wash gently with scrungie or a clean washcloth.  5.  Apply the CHG Soap to your body ONLY FROM THE NECK DOWN.        Do not use on open wounds or open sores.  Avoid contact with your eyes,       ears, mouth and genitals (private parts).  Wash genitals (private parts)       with your normal soap.  6.  Wash thoroughly, paying  special attention to the area where your surgery        will be performed.  7.  Thoroughly rinse your body with warm water from the neck down.  8.  DO NOT shower/wash with your normal soap after using and rinsing off       the CHG Soap.  9.  Pat yourself dry with a clean towel.            10.  Wear clean pajamas.            11.  Place clean sheets on your bed the night of your first shower and do not        sleep with pets.  Day of Surgery  Do not apply any lotions/deoderants the morning of surgery.  Please wear clean clothes to the hospital/surgery center.    Please read over the following fact sheets that you were given. Pain Booklet, Coughing and Deep Breathing, Blood Transfusion Information, MRSA Information and Surgical Site Infection Prevention

## 2014-11-29 NOTE — Progress Notes (Addendum)
Medical Md is Dr.David Environmental education officerTapper  Cardiologist denies having one  Echo report in epic from 2014  Stress test/heart cath denies ever having  EKG/CXR denies having in past yr

## 2014-11-30 LAB — HEMOGLOBIN A1C
Hgb A1c MFr Bld: 11.8 % — ABNORMAL HIGH (ref 4.8–5.6)
Mean Plasma Glucose: 292 mg/dL

## 2014-11-30 NOTE — Progress Notes (Addendum)
Anesthesia Chart Review:  Pt is a 59 year old female scheduled for L3-4 PLIF on 12/06/2014 with Dr. Jordan LikesPool.   PCP is Dr. [name].   PMH includes:  HTN, DM, GERD, post-op N/V. Never smoker. BMI 37. S/p 1 level posterior lumbar fusion 07/08/12.   Medications include: ASA, diltiazem, glipizide, metformin, prilosec, potassium, triamterene-hctz.   Preoperative labs reviewed.  Glucose 328, hgbA1c 11.8.   EKG 11/29/14: NSR. Minimal voltage criteria for LVH, may be normal variant. Possible Anterior infarct, age undetermined. No significant change from 12/18/11 per Dr. Lindaann SloughNelson's interpretation.   Echo 12/30/12:  - Left ventricle: The cavity size was normal. There was mild concentric hypertrophy. Systolic function was normal. The estimated ejection fraction was in the range of 55% to 60%. Wall motion was normal; there were no regional wall motion abnormalities. Doppler parameters are consistent with abnormal left ventricular relaxation (grade 1 diastolic dysfunction). The E/e' ratio is <10,. Suggesting normal LV filling pressure. - Left atrium: The atrium was normal in size. - Inferior vena cava: The vessel was normal in size; the respirophasic diameter changes were in the normal range (=50%); findings are consistent with normal central venous pressure.  Notified Vanessa in Dr. Lindalou HosePool's office that pt will need evaluation by her PCP for uncontrolled DM prior to surgery.   Rica Mastngela Kabbe, FNP-BC [facility]/Anesthesiology Phone: [phone] 11/30/2014 2:46 PM  Addendum: Per Darl PikesSusan at Dr. Lindalou HosePool's office, Dr. Margo Commonapper saw patient recently and "doubled" metformin. She has noticed an improvement in her readings. Dr. Margo Commonapper signed a note of medical clearance for surgery. Patient will get a fasting CBG on the day of surgery to ensure glucose is in an acceptable range to proceed.  Velna Ochsllison Zelenak, PA-C [facility]Anesthesiology Phone [phone] 12/03/2014 4:15 PM

## 2014-11-30 NOTE — Progress Notes (Signed)
I called a prescription for Mupirocin ointment to [facility] in [place], [place].

## 2014-12-03 NOTE — Progress Notes (Signed)
Spoke with Erie NoeVanessa at Dr Lindalou HosePool's office and she states she will be checking on pts medical clearance.

## 2014-12-05 MED ORDER — CEFAZOLIN SODIUM-DEXTROSE 2-3 GM-% IV SOLR
2.0000 g | INTRAVENOUS | Status: AC
  Administered 2014-12-06: 2 g via INTRAVENOUS
  Filled 2014-12-05: qty 50

## 2014-12-06 ENCOUNTER — Inpatient Hospital Stay (HOSPITAL_COMMUNITY): Payer: Commercial Managed Care - PPO

## 2014-12-06 ENCOUNTER — Encounter (HOSPITAL_COMMUNITY)
Admission: AD | Disposition: A | Discharge status: Home Health | Payer: Commercial Managed Care - PPO | Source: Ambulatory Visit | Attending: Neurosurgery

## 2014-12-06 ENCOUNTER — Inpatient Hospital Stay (HOSPITAL_COMMUNITY): Payer: Commercial Managed Care - PPO | Admitting: Anesthesiology

## 2014-12-06 ENCOUNTER — Encounter (HOSPITAL_COMMUNITY): Payer: Self-pay | Admitting: *Deleted

## 2014-12-06 ENCOUNTER — Inpatient Hospital Stay (HOSPITAL_COMMUNITY): Payer: Commercial Managed Care - PPO | Admitting: Emergency Medicine

## 2014-12-06 ENCOUNTER — Inpatient Hospital Stay (HOSPITAL_COMMUNITY)
Admission: AD | Admit: 2014-12-06 | Discharge: 2014-12-07 | DRG: 460 | Disposition: A | Discharge status: Home Health | Payer: Commercial Managed Care - PPO | Source: Ambulatory Visit | Attending: Neurosurgery | Admitting: Neurosurgery

## 2014-12-06 DIAGNOSIS — Z981 Arthrodesis status: Secondary | ICD-10-CM

## 2014-12-06 DIAGNOSIS — M109 Gout, unspecified: Secondary | ICD-10-CM | POA: Diagnosis present

## 2014-12-06 DIAGNOSIS — M549 Dorsalgia, unspecified: Secondary | ICD-10-CM | POA: Diagnosis present

## 2014-12-06 DIAGNOSIS — I10 Essential (primary) hypertension: Secondary | ICD-10-CM | POA: Diagnosis present

## 2014-12-06 DIAGNOSIS — Z419 Encounter for procedure for purposes other than remedying health state, unspecified: Secondary | ICD-10-CM

## 2014-12-06 DIAGNOSIS — M4316 Spondylolisthesis, lumbar region: Principal | ICD-10-CM | POA: Diagnosis present

## 2014-12-06 DIAGNOSIS — Z7984 Long term (current) use of oral hypoglycemic drugs: Secondary | ICD-10-CM | POA: Diagnosis not present

## 2014-12-06 DIAGNOSIS — G8929 Other chronic pain: Secondary | ICD-10-CM | POA: Diagnosis present

## 2014-12-06 DIAGNOSIS — M79605 Pain in left leg: Secondary | ICD-10-CM | POA: Diagnosis present

## 2014-12-06 DIAGNOSIS — K219 Gastro-esophageal reflux disease without esophagitis: Secondary | ICD-10-CM | POA: Diagnosis present

## 2014-12-06 DIAGNOSIS — Z79899 Other long term (current) drug therapy: Secondary | ICD-10-CM | POA: Diagnosis not present

## 2014-12-06 DIAGNOSIS — M4310 Spondylolisthesis, site unspecified: Secondary | ICD-10-CM | POA: Diagnosis present

## 2014-12-06 DIAGNOSIS — Z7982 Long term (current) use of aspirin: Secondary | ICD-10-CM

## 2014-12-06 DIAGNOSIS — E119 Type 2 diabetes mellitus without complications: Secondary | ICD-10-CM | POA: Diagnosis present

## 2014-12-06 LAB — POCT I-STAT 4, (NA,K, GLUC, HGB,HCT)
Glucose, Bld: 222 mg/dL — ABNORMAL HIGH (ref 65–99)
Glucose, Bld: 245 mg/dL — ABNORMAL HIGH (ref 65–99)
HCT: 40 % (ref 36.0–46.0)
HCT: 41 % (ref 36.0–46.0)
Hemoglobin: 13.6 g/dL (ref 12.0–15.0)
Hemoglobin: 13.9 g/dL (ref 12.0–15.0)
Potassium: 2.9 mmol/L — ABNORMAL LOW (ref 3.5–5.1)
Potassium: 3.2 mmol/L — ABNORMAL LOW (ref 3.5–5.1)
Sodium: 137 mmol/L (ref 135–145)
Sodium: 138 mmol/L (ref 135–145)

## 2014-12-06 LAB — GLUCOSE, CAPILLARY
Glucose-Capillary: 313 mg/dL — ABNORMAL HIGH (ref 65–99)
Glucose-Capillary: 168 mg/dL — ABNORMAL HIGH (ref 65–99)
Glucose-Capillary: 312 mg/dL — ABNORMAL HIGH (ref 65–99)
Glucose-Capillary: 316 mg/dL — ABNORMAL HIGH (ref 65–99)

## 2014-12-06 SURGERY — POSTERIOR LUMBAR FUSION 1 LEVEL
Anesthesia: General | Site: Back

## 2014-12-06 MED ORDER — INSULIN ASPART 100 UNIT/ML ~~LOC~~ SOLN
0.0000 [IU] | Freq: Three times a day (TID) | SUBCUTANEOUS | Status: DC
  Administered 2014-12-06: 15 [IU] via SUBCUTANEOUS
  Administered 2014-12-06: 11 [IU] via SUBCUTANEOUS
  Administered 2014-12-07: 15 [IU] via SUBCUTANEOUS
  Administered 2014-12-07: 11 [IU] via SUBCUTANEOUS
  Administered 2014-12-07: 4 [IU] via SUBCUTANEOUS

## 2014-12-06 MED ORDER — EPHEDRINE SULFATE 50 MG/ML IJ SOLN
INTRAMUSCULAR | Status: AC
  Filled 2014-12-06: qty 1

## 2014-12-06 MED ORDER — LIDOCAINE HCL (CARDIAC) 20 MG/ML IV SOLN
INTRAVENOUS | Status: DC | PRN
  Administered 2014-12-06: 100 mg via INTRAVENOUS

## 2014-12-06 MED ORDER — MIDAZOLAM HCL 2 MG/2ML IJ SOLN
INTRAMUSCULAR | Status: AC
  Filled 2014-12-06: qty 2

## 2014-12-06 MED ORDER — NEOSTIGMINE METHYLSULFATE 10 MG/10ML IV SOLN
INTRAVENOUS | Status: AC
  Filled 2014-12-06: qty 1

## 2014-12-06 MED ORDER — DEXAMETHASONE SODIUM PHOSPHATE 10 MG/ML IJ SOLN
10.0000 mg | INTRAMUSCULAR | Status: AC
  Administered 2014-12-06: 10 mg via INTRAVENOUS
  Filled 2014-12-06: qty 1

## 2014-12-06 MED ORDER — SUFENTANIL CITRATE 50 MCG/ML IV SOLN
INTRAVENOUS | Status: DC | PRN
  Administered 2014-12-06 (×4): 5 ug via INTRAVENOUS

## 2014-12-06 MED ORDER — INSULIN ASPART 100 UNIT/ML ~~LOC~~ SOLN
10.0000 [IU] | Freq: Once | SUBCUTANEOUS | Status: AC
  Administered 2014-12-06: 10 [IU] via SUBCUTANEOUS
  Filled 2014-12-06: qty 0.1

## 2014-12-06 MED ORDER — GLIPIZIDE ER 5 MG PO TB24
5.0000 mg | ORAL_TABLET | Freq: Every day | ORAL | Status: DC
  Administered 2014-12-07: 5 mg via ORAL
  Filled 2014-12-06 (×2): qty 1

## 2014-12-06 MED ORDER — HYDROMORPHONE HCL 1 MG/ML IJ SOLN
INTRAMUSCULAR | Status: AC
  Administered 2014-12-06: 0.5 mg via INTRAVENOUS
  Filled 2014-12-06: qty 1

## 2014-12-06 MED ORDER — ASPIRIN EC 81 MG PO TBEC
81.0000 mg | DELAYED_RELEASE_TABLET | Freq: Every day | ORAL | Status: DC
  Administered 2014-12-07: 81 mg via ORAL
  Filled 2014-12-06: qty 1

## 2014-12-06 MED ORDER — OXYCODONE-ACETAMINOPHEN 5-325 MG PO TABS
1.0000 | ORAL_TABLET | ORAL | Status: DC | PRN
  Administered 2014-12-06 – 2014-12-07 (×6): 2 via ORAL
  Filled 2014-12-06 (×6): qty 2

## 2014-12-06 MED ORDER — CEFAZOLIN SODIUM 1-5 GM-% IV SOLN
1.0000 g | Freq: Three times a day (TID) | INTRAVENOUS | Status: AC
  Administered 2014-12-06 (×2): 1 g via INTRAVENOUS
  Filled 2014-12-06 (×2): qty 50

## 2014-12-06 MED ORDER — HYDROMORPHONE HCL 1 MG/ML IJ SOLN
0.2500 mg | INTRAMUSCULAR | Status: DC | PRN
  Administered 2014-12-06 (×4): 0.5 mg via INTRAVENOUS

## 2014-12-06 MED ORDER — PHENOL 1.4 % MT LIQD: 1.0000 | OROMUCOSAL | Status: DC | PRN

## 2014-12-06 MED ORDER — SUCCINYLCHOLINE CHLORIDE 20 MG/ML IJ SOLN
INTRAMUSCULAR | Status: AC
  Filled 2014-12-06: qty 1

## 2014-12-06 MED ORDER — ROCURONIUM BROMIDE 50 MG/5ML IV SOLN
INTRAVENOUS | Status: AC
  Filled 2014-12-06: qty 1

## 2014-12-06 MED ORDER — HYDROCODONE-ACETAMINOPHEN 5-325 MG PO TABS: 1.0000 | ORAL_TABLET | ORAL | Status: DC | PRN

## 2014-12-06 MED ORDER — ACETAMINOPHEN 325 MG PO TABS: 650.0000 mg | ORAL_TABLET | ORAL | Status: DC | PRN

## 2014-12-06 MED ORDER — MIDAZOLAM HCL 5 MG/5ML IJ SOLN
INTRAMUSCULAR | Status: DC | PRN
  Administered 2014-12-06: 1 mg via INTRAVENOUS

## 2014-12-06 MED ORDER — COLCHICINE 0.6 MG PO TABS
0.6000 mg | ORAL_TABLET | Freq: Every day | ORAL | Status: DC | PRN
  Filled 2014-12-06: qty 1

## 2014-12-06 MED ORDER — GLYCOPYRROLATE 0.2 MG/ML IJ SOLN
INTRAMUSCULAR | Status: AC
  Filled 2014-12-06: qty 1

## 2014-12-06 MED ORDER — SUFENTANIL CITRATE 50 MCG/ML IV SOLN
INTRAVENOUS | Status: AC
  Filled 2014-12-06: qty 1

## 2014-12-06 MED ORDER — HYDROMORPHONE HCL 1 MG/ML IJ SOLN: 0.5000 mg | INTRAMUSCULAR | Status: DC | PRN

## 2014-12-06 MED ORDER — HYDROCODONE-ACETAMINOPHEN 7.5-325 MG PO TABS: 1.0000 | ORAL_TABLET | Freq: Once | ORAL | Status: DC | PRN

## 2014-12-06 MED ORDER — PROMETHAZINE HCL 25 MG/ML IJ SOLN
6.2500 mg | INTRAMUSCULAR | Status: DC | PRN
Start: 2014-12-06 — End: 2014-12-06

## 2014-12-06 MED ORDER — PANTOPRAZOLE SODIUM 40 MG PO TBEC
40.0000 mg | DELAYED_RELEASE_TABLET | Freq: Every day | ORAL | Status: DC
  Administered 2014-12-07: 40 mg via ORAL
  Filled 2014-12-06: qty 1

## 2014-12-06 MED ORDER — ONDANSETRON HCL 4 MG/2ML IJ SOLN: 4.0000 mg | INTRAMUSCULAR | Status: DC | PRN

## 2014-12-06 MED ORDER — PROPOFOL 10 MG/ML IV BOLUS
INTRAVENOUS | Status: DC | PRN
  Administered 2014-12-06: 150 mg via INTRAVENOUS

## 2014-12-06 MED ORDER — 0.9 % SODIUM CHLORIDE (POUR BTL) OPTIME
TOPICAL | Status: DC | PRN
Start: 2014-12-06 — End: 2014-12-06
  Administered 2014-12-06: 1000 mL

## 2014-12-06 MED ORDER — BUPIVACAINE HCL (PF) 0.25 % IJ SOLN
INTRAMUSCULAR | Status: DC | PRN
  Administered 2014-12-06: 20 mL

## 2014-12-06 MED ORDER — SODIUM CHLORIDE 0.9 % IJ SOLN: 3.0000 mL | INTRAMUSCULAR | Status: DC | PRN

## 2014-12-06 MED ORDER — SODIUM CHLORIDE 0.9 % IR SOLN
Status: DC | PRN
  Administered 2014-12-06: 09:00:00

## 2014-12-06 MED ORDER — ARTIFICIAL TEARS OP OINT
TOPICAL_OINTMENT | OPHTHALMIC | Status: AC
  Filled 2014-12-06: qty 3.5

## 2014-12-06 MED ORDER — TRIAMTERENE-HCTZ 37.5-25 MG PO CAPS
1.0000 | ORAL_CAPSULE | Freq: Every day | ORAL | Status: DC
  Administered 2014-12-07: 1 via ORAL
  Filled 2014-12-06 (×2): qty 1

## 2014-12-06 MED ORDER — DIPHENHYDRAMINE HCL 25 MG PO CAPS
25.0000 mg | ORAL_CAPSULE | ORAL | Status: DC | PRN
  Administered 2014-12-06 – 2014-12-07 (×3): 25 mg via ORAL
  Filled 2014-12-06 (×3): qty 1

## 2014-12-06 MED ORDER — GLYCOPYRROLATE 0.2 MG/ML IJ SOLN
INTRAMUSCULAR | Status: DC | PRN
  Administered 2014-12-06: 0.4 mg via INTRAVENOUS

## 2014-12-06 MED ORDER — NEOSTIGMINE METHYLSULFATE 10 MG/10ML IV SOLN
INTRAVENOUS | Status: DC | PRN
  Administered 2014-12-06: 3 mg via INTRAVENOUS

## 2014-12-06 MED ORDER — PHENYLEPHRINE 40 MCG/ML (10ML) SYRINGE FOR IV PUSH (FOR BLOOD PRESSURE SUPPORT)
PREFILLED_SYRINGE | INTRAVENOUS | Status: AC
  Filled 2014-12-06: qty 10

## 2014-12-06 MED ORDER — SURGIFOAM 100 EX MISC
CUTANEOUS | Status: DC | PRN
  Administered 2014-12-06: 09:00:00 via TOPICAL

## 2014-12-06 MED ORDER — SODIUM CHLORIDE 0.9 % IV SOLN
INTRAVENOUS | Status: DC | PRN
  Administered 2014-12-06: 07:00:00 via INTRAVENOUS

## 2014-12-06 MED ORDER — PROPOFOL 500 MG/50ML IV EMUL
INTRAVENOUS | Status: DC | PRN
  Administered 2014-12-06: 50 ug/kg/min via INTRAVENOUS

## 2014-12-06 MED ORDER — SODIUM CHLORIDE 0.9 % IJ SOLN
INTRAMUSCULAR | Status: AC
  Filled 2014-12-06: qty 20

## 2014-12-06 MED ORDER — DILTIAZEM HCL ER 120 MG PO CP24
120.0000 mg | ORAL_CAPSULE | Freq: Every day | ORAL | Status: DC
  Administered 2014-12-07: 120 mg via ORAL
  Filled 2014-12-06: qty 1

## 2014-12-06 MED ORDER — PROPOFOL 10 MG/ML IV BOLUS
INTRAVENOUS | Status: AC
  Filled 2014-12-06: qty 20

## 2014-12-06 MED ORDER — LACTATED RINGERS IV SOLN
INTRAVENOUS | Status: DC | PRN
  Administered 2014-12-06: 10:00:00 via INTRAVENOUS

## 2014-12-06 MED ORDER — SODIUM CHLORIDE 0.9 % IJ SOLN
3.0000 mL | Freq: Two times a day (BID) | INTRAMUSCULAR | Status: DC
  Administered 2014-12-06 (×2): 3 mL via INTRAVENOUS

## 2014-12-06 MED ORDER — DIAZEPAM 5 MG PO TABS
5.0000 mg | ORAL_TABLET | Freq: Four times a day (QID) | ORAL | Status: DC | PRN
  Administered 2014-12-06 – 2014-12-07 (×4): 5 mg via ORAL
  Filled 2014-12-06 (×4): qty 1

## 2014-12-06 MED ORDER — ACETAMINOPHEN 650 MG RE SUPP: 650.0000 mg | RECTAL | Status: DC | PRN

## 2014-12-06 MED ORDER — POTASSIUM CHLORIDE ER 10 MEQ PO TBCR
10.0000 meq | EXTENDED_RELEASE_TABLET | Freq: Every day | ORAL | Status: DC
  Administered 2014-12-06 – 2014-12-07 (×2): 10 meq via ORAL
  Filled 2014-12-06 (×2): qty 1

## 2014-12-06 MED ORDER — ONDANSETRON HCL 4 MG/2ML IJ SOLN
INTRAMUSCULAR | Status: AC
  Filled 2014-12-06: qty 2

## 2014-12-06 MED ORDER — GLYCOPYRROLATE 0.2 MG/ML IJ SOLN
INTRAMUSCULAR | Status: AC
  Filled 2014-12-06: qty 2

## 2014-12-06 MED ORDER — ONDANSETRON HCL 4 MG/2ML IJ SOLN
INTRAMUSCULAR | Status: DC | PRN
  Administered 2014-12-06: 4 mg via INTRAVENOUS

## 2014-12-06 MED ORDER — ARTIFICIAL TEARS OP OINT
TOPICAL_OINTMENT | OPHTHALMIC | Status: DC | PRN
  Administered 2014-12-06: 1 via OPHTHALMIC

## 2014-12-06 MED ORDER — VANCOMYCIN HCL 1000 MG IV SOLR
INTRAVENOUS | Status: AC
  Filled 2014-12-06: qty 1000

## 2014-12-06 MED ORDER — LIDOCAINE HCL (CARDIAC) 20 MG/ML IV SOLN
INTRAVENOUS | Status: AC
  Filled 2014-12-06: qty 5

## 2014-12-06 MED ORDER — MENTHOL 3 MG MT LOZG: 1.0000 | LOZENGE | OROMUCOSAL | Status: DC | PRN

## 2014-12-06 MED ORDER — ROCURONIUM BROMIDE 100 MG/10ML IV SOLN
INTRAVENOUS | Status: DC | PRN
  Administered 2014-12-06: 50 mg via INTRAVENOUS

## 2014-12-06 MED ORDER — VANCOMYCIN HCL 1000 MG IV SOLR
INTRAVENOUS | Status: DC | PRN
  Administered 2014-12-06: 1000 mg via TOPICAL

## 2014-12-06 MED ORDER — HYDROCODONE-ACETAMINOPHEN 5-325 MG PO TABS
ORAL_TABLET | ORAL | Status: AC
  Administered 2014-12-06: 1
  Filled 2014-12-06: qty 1

## 2014-12-06 MED ORDER — METFORMIN HCL 500 MG PO TABS
1000.0000 mg | ORAL_TABLET | Freq: Two times a day (BID) | ORAL | Status: DC
  Administered 2014-12-06 – 2014-12-07 (×3): 1000 mg via ORAL
  Filled 2014-12-06 (×2): qty 2

## 2014-12-06 MED FILL — Sodium Chloride IV Soln 0.9%: INTRAVENOUS | Qty: 1000 | Status: AC

## 2014-12-06 MED FILL — Heparin Sodium (Porcine) Inj 1000 Unit/ML: INTRAMUSCULAR | Qty: 30 | Status: AC

## 2014-12-06 SURGICAL SUPPLY — 61 items
APL SKNCLS STERI-STRIP NONHPOA (GAUZE/BANDAGES/DRESSINGS) ×1
BAG DECANTER FOR FLEXI CONT (MISCELLANEOUS) ×2 IMPLANT
BENZOIN TINCTURE PRP APPL 2/3 (GAUZE/BANDAGES/DRESSINGS) ×2 IMPLANT
BLADE CLIPPER SURG (BLADE) IMPLANT
BRUSH SCRUB EZ PLAIN DRY (MISCELLANEOUS) ×2 IMPLANT
BUR CUTTER 7.0 ROUND (BURR) ×2 IMPLANT
BUR MATCHSTICK NEURO 3.0 LAGG (BURR) ×2 IMPLANT
CANISTER SUCT 3000ML PPV (MISCELLANEOUS) ×2 IMPLANT
CAP LCK SPNE (Orthopedic Implant) ×5 IMPLANT
CAP LOCK SPINE RADIUS (Orthopedic Implant) ×5 IMPLANT
CAP LOCKING (Orthopedic Implant) ×10 IMPLANT
CONT SPEC 4OZ CLIKSEAL STRL BL (MISCELLANEOUS) ×2 IMPLANT
COVER BACK TABLE 60X90IN (DRAPES) ×2 IMPLANT
DECANTER SPIKE VIAL GLASS SM (MISCELLANEOUS) ×2 IMPLANT
DEVICE INTERBODY ELEVATE 10X23 (Cage) ×4 IMPLANT
DRAPE C-ARM 42X72 X-RAY (DRAPES) ×4 IMPLANT
DRAPE LAPAROTOMY 100X72X124 (DRAPES) ×2 IMPLANT
DRAPE POUCH INSTRU U-SHP 10X18 (DRAPES) ×2 IMPLANT
DRAPE PROXIMA HALF (DRAPES) IMPLANT
DRAPE SURG 17X23 STRL (DRAPES) ×8 IMPLANT
DRSG OPSITE POSTOP 4X6 (GAUZE/BANDAGES/DRESSINGS) ×2 IMPLANT
DURAPREP 26ML APPLICATOR (WOUND CARE) ×2 IMPLANT
ELECT REM PT RETURN 9FT ADLT (ELECTROSURGICAL) ×2
ELECTRODE REM PT RTRN 9FT ADLT (ELECTROSURGICAL) ×1 IMPLANT
EVACUATOR 1/8 PVC DRAIN (DRAIN) IMPLANT
GAUZE SPONGE 4X4 12PLY STRL (GAUZE/BANDAGES/DRESSINGS) ×2 IMPLANT
GAUZE SPONGE 4X4 16PLY XRAY LF (GAUZE/BANDAGES/DRESSINGS) IMPLANT
GLOVE BIO SURGEON STRL SZ8 (GLOVE) ×2 IMPLANT
GLOVE ECLIPSE 9.0 STRL (GLOVE) ×4 IMPLANT
GLOVE EXAM NITRILE LRG STRL (GLOVE) IMPLANT
GLOVE EXAM NITRILE MD LF STRL (GLOVE) IMPLANT
GLOVE EXAM NITRILE XL STR (GLOVE) IMPLANT
GLOVE EXAM NITRILE XS STR PU (GLOVE) IMPLANT
GLOVE INDICATOR 8.5 STRL (GLOVE) ×2 IMPLANT
GOWN STRL REUS W/ TWL LRG LVL3 (GOWN DISPOSABLE) IMPLANT
GOWN STRL REUS W/ TWL XL LVL3 (GOWN DISPOSABLE) ×2 IMPLANT
GOWN STRL REUS W/TWL 2XL LVL3 (GOWN DISPOSABLE) IMPLANT
GOWN STRL REUS W/TWL LRG LVL3 (GOWN DISPOSABLE)
GOWN STRL REUS W/TWL XL LVL3 (GOWN DISPOSABLE) ×4
KIT BASIN OR (CUSTOM PROCEDURE TRAY) ×2 IMPLANT
KIT ROOM TURNOVER OR (KITS) ×2 IMPLANT
LIQUID BAND (GAUZE/BANDAGES/DRESSINGS) ×2 IMPLANT
MILL MEDIUM DISP (BLADE) ×2 IMPLANT
NEEDLE HYPO 22GX1.5 SAFETY (NEEDLE) ×2 IMPLANT
NS IRRIG 1000ML POUR BTL (IV SOLUTION) ×2 IMPLANT
PACK LAMINECTOMY NEURO (CUSTOM PROCEDURE TRAY) ×2 IMPLANT
ROD 50MM (Rod) ×2 IMPLANT
ROD 70MM (Rod) ×2 IMPLANT
ROD SPNL 50X5.5XNS TI RDS (Rod) ×1 IMPLANT
ROD SPNL 70X5.5XNS TI RDS (Rod) ×1 IMPLANT
SCREW 5.75X40M (Screw) ×4 IMPLANT
SPONGE SURGIFOAM ABS GEL 100 (HEMOSTASIS) ×2 IMPLANT
STRIP CLOSURE SKIN 1/2X4 (GAUZE/BANDAGES/DRESSINGS) ×2 IMPLANT
SUT VIC AB 0 CT1 18XCR BRD8 (SUTURE) ×1 IMPLANT
SUT VIC AB 0 CT1 8-18 (SUTURE) ×2
SUT VIC AB 2-0 CT1 18 (SUTURE) ×2 IMPLANT
SUT VIC AB 3-0 SH 8-18 (SUTURE) ×2 IMPLANT
TOWEL OR 17X24 6PK STRL BLUE (TOWEL DISPOSABLE) ×2 IMPLANT
TOWEL OR 17X26 10 PK STRL BLUE (TOWEL DISPOSABLE) ×2 IMPLANT
TRAY FOLEY W/METER SILVER 14FR (SET/KITS/TRAYS/PACK) ×2 IMPLANT
WATER STERILE IRR 1000ML POUR (IV SOLUTION) ×2 IMPLANT

## 2014-12-06 NOTE — Op Note (Signed)
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  L3-4 posterior lumbar interbody fusion utilizing interbody expandable peek/titanium cages with local autografting  L3-4-5 posterior lateral arthrodesis utilizing segmental pedicle screw fixation and local autografting  Reexploration of L4-5 posterior lateral fusion with removal of hardware  Surgeon:Hilary Pundt A.Lashone Stauber, M.D.  Asst. Surgeon: Wynetta Emery  Anesthesia: General  Indication: 59 year old female status post previous L4-5 decompression infusion presents now with worsening back and left lower extremity pain consistent with an L3 and L4 radicular pattern. Workup demonstrates evidence of progressive collapse with unstable spondylolisthesis at L3-4 causing severe compression of the thecal sac and severe left L3-4 neural foraminal stenosis. Patient appears to have solid fusion at L4-5. She presents now for L3 for decompression and fusion after failing conservative management.  Operative note: After induction anesthesia, patient position prone onto Wilson frame and a properly padded. Lumbar region prepped and draped sterilely. Incision made overlying L3-4-5. Dissection performed bilaterally. Previously placed pedicle screws mentation and L4-5 was dissected free and disassembled and rods and caps were removed. Retractor was placed. Redo decompressive laminotomies then performed using Leksell rongeurs Kerrison rongeurs the high-speed drill at L3-4. The inferior two thirds of the lamina of L3 was removed bilaterally. The entire inferior facets of L3 were  removed bilaterally the majority the superior facet of L4 was removed bilaterally. Ligament flavum and epidural scar were elevated and resected. Decompressive foraminotomies were completed along the course of the L3 and L4 nerve roots in excess of what would be necessary for interbody fusion. Bilateral discectomies were then performed. Disc space then distracted up to 10 mm. 10 mm distractor left the patient's left side. Thecal sac and nerve respect on the right side. Disc space prepared for interbody fusion with various curettes and scrapers. Soft tissue removed from the interspace. 10 mm expandable Medtronic cage was impacted into place and expanded up to 14 mm with 8 of lordosis. Distractor was removed patient's left side. Disc space once again prepared on the left side. Morselized autograft packed into the interspace. A second cage packed with locally harvested autograft was then packed into place and expanded to its full extent. Pedicles of L3 were identified using surface landmarks and intraoperative fluoroscopy. Sufficient bone was removed over the pedicles. Each pedicles and probed using a pedicle awl each pedicle awl track was tapped with a 5.25 mm screw tap. Each screw tap hole was probed and found to be solidly within the bone. 5.75 x 40 mm radius brand screws from [employer] were placed bilaterally. Transverse processes and residual facets were decorticated. A short segment titanium rods and placed or the screw heads at L3-4 and 5 on the patient's right side. The screw on the right-sided L5 was removed in a short segment of rod was placed over the screws at L3 and L4. Locking caps and placed over the screws. Locking caps were then engaged with the construct under compression. Final images revealed good position of the bone graft and hardware at proper upper level with normal alignment of the spine. Wounds and irrigated one final time. Gelfoam was placed topically hemostasis. Vancomycin powder was  left in the deep wound space. Wounds and close in layers. Steri-Strips and sterile  dressing were applied. No apparent complications. Patient tolerated the procedure well and turns to the recovery room postop.

## 2014-12-06 NOTE — Transfer of Care (Signed)
Immediate Anesthesia Transfer of Care Note  Patient: Sara MusselGracie Kingsford  Procedure(s) Performed: Procedure(s): Posterior Lumbar Interbody Fusion Lumbar Three-Lumbar Four  (N/A)  Patient Location: PACU  Anesthesia Type:General  Level of Consciousness: awake, sedated and patient cooperative  Airway & Oxygen Therapy: Patient Spontanous Breathing and Patient connected to face mask oxygen  Post-op Assessment: Report given to RN, Post -op Vital signs reviewed and stable, Patient moving all extremities and Patient moving all extremities X 4  Post vital signs: Reviewed and stable  Last Vitals:  Filed Vitals:   12/06/14 0614 12/06/14 1012  BP: 128/79   Temp: 36.7 C 36.7 C  Resp: 18     Complications: No apparent anesthesia complications

## 2014-12-06 NOTE — Brief Op Note (Signed)
12/06/2014  9:59 AM  PATIENT:  Sara Stuart  59 y.o. female  PRE-OPERATIVE DIAGNOSIS:  Spondylolisthesis  POST-OPERATIVE DIAGNOSIS:  Spondylolisthesis  PROCEDURE:  Procedure(s): Posterior Lumbar Interbody Fusion Lumbar Three-Lumbar Four  (N/A)  SURGEON:  Surgeon(s) and Role:    * Julio SicksHenry Nyree Yonker, MD - Primary    * Donalee CitrinGary Cram, MD - Assisting  PHYSICIAN ASSISTANT:   ASSISTANTS:    ANESTHESIA:   general  EBL:  Total I/O In: 1000 [I.V.:1000] Out: 180 [Urine:80; Blood:100]  BLOOD ADMINISTERED:none  DRAINS: none   LOCAL MEDICATIONS USED:  MARCAINE     SPECIMEN:  No Specimen  DISPOSITION OF SPECIMEN:  N/A  COUNTS:  YES  TOURNIQUET:  * No tourniquets in log *  DICTATION: .Dragon Dictation  PLAN OF CARE: Admit to inpatient   PATIENT DISPOSITION:  PACU - hemodynamically stable.   Delay start of Pharmacological VTE agent (>24hrs) due to surgical blood loss or risk of bleeding: yes

## 2014-12-06 NOTE — Progress Notes (Signed)
Utilization review completed.  

## 2014-12-06 NOTE — Anesthesia Postprocedure Evaluation (Signed)
Anesthesia Post Note  Patient: Sara Stuart  Procedure(s) Performed: Procedure(s) (LRB): Posterior Lumbar Interbody Fusion Lumbar Three-Lumbar Four  (N/A)  Patient location during evaluation: PACU Anesthesia Type: General Level of consciousness: awake and alert Pain management: pain level controlled Vital Signs Assessment: post-procedure vital signs reviewed and stable Respiratory status: spontaneous breathing, nonlabored ventilation, respiratory function stable and patient connected to nasal cannula oxygen Cardiovascular status: blood pressure returned to baseline and stable Postop Assessment: no signs of nausea or vomiting Anesthetic complications: no    Last Vitals:  Filed Vitals:   12/06/14 1115 12/06/14 1125  BP: 98/74 101/68  Pulse: 50 55  Temp:    Resp: 10 14    Last Pain:  Filed Vitals:   12/06/14 1145  PainSc: 5                  Reino KentJudd, Leonard Feigel J

## 2014-12-06 NOTE — Anesthesia Preprocedure Evaluation (Addendum)
Anesthesia Evaluation  Patient identified by MRN, date of birth, ID band Patient awake    Reviewed: Allergy & Precautions, H&P , NPO status , Patient's Chart, lab work & pertinent test results  History of Anesthesia Complications (+) PONV and history of anesthetic complications  Airway Mallampati: I  TM Distance: >3 FB Neck ROM: full    Dental  (+) Edentulous Upper, Poor Dentition, Partial Lower, Dental Advisory Given   Pulmonary    Pulmonary exam normal breath sounds clear to auscultation       Cardiovascular hypertension, Pt. on medications  Rhythm:regular Rate:Normal     Neuro/Psych  Neuromuscular disease    GI/Hepatic GERD  ,  Endo/Other  diabetes, Poorly Controlled, Type 2obesity  Renal/GU      Musculoskeletal  (+) Arthritis ,   Abdominal   Peds  Hematology   Anesthesia Other Findings Hx Polio  HA1C is >11  Reproductive/Obstetrics                         Anesthesia Physical  Anesthesia Plan  ASA: III  Anesthesia Plan: General   Post-op Pain Management:    Induction: Intravenous  Airway Management Planned: Oral ETT  Additional Equipment:   Intra-op Plan:   Post-operative Plan: Extubation in OR  Informed Consent: I have reviewed the patients History and Physical, chart, labs and discussed the procedure including the risks, benefits and alternatives for the proposed anesthesia with the patient or authorized representative who has indicated his/her understanding and acceptance.     Plan Discussed with: CRNA, Anesthesiologist and Surgeon  Anesthesia Plan Comments: (PONV ppx: rec decadron, zofran, background propofol infusion   Previous grade 1 DL with Mac 3  Cell saver  Glucose this am was 313, 10u subq Novolog given and Dr. Dutch QuintPoole is aware  )      Anesthesia Quick Evaluation

## 2014-12-06 NOTE — H&P (Signed)
Sara Stuart is an 59 y.o. female.   Chief Complaint: Back and left leg pain HPI: 59 year old female status post previous L4-5 decompression and fusion. Patient presents now with progressive back and left lower extremity pain consistent with Stuart left L4 and L3 radicular pattern. Patient has failed conservative management. Workup demonstrates evidence of Stuart progressive unstable spondylolisthesis at L3-4 with marked stenosis and severe neural foraminal stenosis left greater than right. Patient presents now for L3 for decompression and fusion in hopes of improving her symptoms.  Past Medical History  Diagnosis Date  . Polio   . Arthritis   . Gout     takes Colchicine daily as needed  . GERD (gastroesophageal reflux disease)     takes Omeprazole daily  . Hypertension     takes Dyazide and Diltiazem daily  . PONV (postoperative nausea and vomiting)   . Chronic back pain     spondylolishthesis  . Nocturia   . Diabetes mellitus without complication (HCC) diagnosed in 02/2014    takes Metformin and Glipizide daily    Past Surgical History  Procedure Laterality Date  . Abdominal hysterectomy      TUBAL PREGNANCY  . Foot surgery      more than 5 surgeries on right foot related to polio  . Surgery for polio      states x 5   . Bladder tacked     . Back surgery  07/2012  . Tubal ligation    . Colonoscopy      History reviewed. No pertinent family history. Social History:  reports that she has never smoked. She has never used smokeless tobacco. She reports that she does not drink alcohol or use illicit drugs.  Allergies: No Known Allergies  Medications Prior to Admission  Medication Sig Dispense Refill  . diltiazem (DILACOR XR) 120 MG 24 hr capsule Take 120 mg by mouth daily.    Marland Kitchen. glipiZIDE (GLUCOTROL XL) 5 MG 24 hr tablet Take 5 mg by mouth daily with breakfast.    . HYDROcodone-acetaminophen (NORCO) 10-325 MG tablet Take 1 tablet by mouth every 6 (six) hours as needed.    Marland Kitchen. ibuprofen  (ADVIL,MOTRIN) 800 MG tablet Take 800 mg by mouth every 8 (eight) hours as needed for moderate pain.    . metFORMIN (GLUCOPHAGE) 500 MG tablet Take 1,000 mg by mouth 2 (two) times daily with Stuart meal.     . omeprazole (PRILOSEC) 40 MG capsule Take 40 mg by mouth daily.    Marland Kitchen. triamterene-hydrochlorothiazide (DYAZIDE) 37.5-25 MG per capsule Take 1 capsule by mouth daily.    Marland Kitchen. aspirin EC 81 MG tablet Take 1 tablet (81 mg total) by mouth daily. 30 tablet   . colchicine 0.6 MG tablet Take 0.6 mg by mouth daily as needed (for gout pain).     . potassium chloride (K-DUR) 10 MEQ tablet Take 10 mEq by mouth daily.    Marland Kitchen. UNABLE TO FIND Sara Stuart was admitted to the hospital from 12/29/12 to 12/30/12.  She is stable to return to work (Patient not taking: Reported on 11/26/2014) 1 Act 0    Results for orders placed or performed during the hospital encounter of 12/06/14 (from the past 48 hour(s))  Glucose, capillary     Status: Abnormal   Collection Time: 12/06/14  6:20 AM  Result Value Ref Range   Glucose-Capillary 313 (H) 65 - 99 mg/dL   No results found.  Pertinent items noted in HPI and remainder of comprehensive ROS otherwise  negative.  Blood pressure 128/79, temperature 98 F (36.7 C), temperature source Oral, resp. rate 18, height 5' (1.524 m), weight 86.637 kg (191 lb), SpO2 99 %.  The patient is awake and alert. She is oriented and appropriate. She is overweight. Examination head ears eyes and Thursdays unremarkable. Chest and abdomen are benign. Extremities are free from injury or deformity. Examination neurologically [employer] B awake and alert. Oriented and appropriate. Cranial nerve function is intact. Motor examination reveals mild weakness of her left quadriceps muscle group otherwise motor strength intact. Sensory examination is decreased sensation pinprick and light touch left L4 dermatome. Deep tender his hypoactive in both lower extremities. No evidence of long track signs. Gait antalgic with Stuart  flexed posture. Assessment/Plan L3-4 degenerative unstable spondylolisthesis at an adjacent segment to previous L4-5 fusion. Patient resents now for L3-4 decompressive laminectomy and foraminotomies followed by posterior lumbar body fusion utilizing interbody peek cages, locally harvested autograft, and supplemented with posterior lateral arthrodesis utilizing segmental pedicle screw fixation and local autografting. Risks and benefits of been explained. Patient wishes to proceed.  Sara Stuart 12/06/2014, 7:44 AM

## 2014-12-06 NOTE — Anesthesia Procedure Notes (Signed)
Procedure Name: Intubation Date/Time: 12/06/2014 8:00 AM Performed by: Coralee RudFLORES, Nandita Mathenia Pre-anesthesia Checklist: Patient identified, Emergency Drugs available, Suction available and Patient being monitored Patient Re-evaluated:Patient Re-evaluated prior to inductionOxygen Delivery Method: Circle system utilized Preoxygenation: Pre-oxygenation with 100% oxygen Intubation Type: IV induction Ventilation: Mask ventilation without difficulty Laryngoscope Size: Miller and 3 Grade View: Grade I Tube type: Oral Tube size: 7.0 mm Number of attempts: 1 Airway Equipment and Method: Stylet Placement Confirmation: ETT inserted through vocal cords under direct vision,  positive ETCO2 and breath sounds checked- equal and bilateral Secured at: 21 cm Tube secured with: Tape Dental Injury: Teeth and Oropharynx as per pre-operative assessment

## 2014-12-06 NOTE — Progress Notes (Signed)
CBG  313 @0615 .  Dr. Noreene LarssonJoslin notified,Novolog insulin 10 units sq ordered and given.

## 2014-12-07 LAB — GLUCOSE, CAPILLARY
Glucose-Capillary: 239 mg/dL — ABNORMAL HIGH (ref 65–99)
Glucose-Capillary: 289 mg/dL — ABNORMAL HIGH (ref 65–99)
Glucose-Capillary: 193 mg/dL — ABNORMAL HIGH (ref 65–99)
Glucose-Capillary: 309 mg/dL — ABNORMAL HIGH (ref 65–99)

## 2014-12-07 NOTE — Progress Notes (Signed)
Patient alert and oriented, mae's well, voiding adequate amount of urine, swallowing without difficulty, no c/o pain. Patient discharged home with family. Script and discharged instructions given to patient. Patient and family stated understanding of d/c instructions given and has an appointment with MD. 

## 2014-12-07 NOTE — Progress Notes (Signed)
Inpatient Diabetes Program Recommendations  AACE/ADA: New Consensus Statement on Inpatient Glycemic Control (2015)  Target Ranges:  Prepandial:   less than 140 mg/dL      Peak postprandial:   less than 180 mg/dL (1-2 hours)      Critically ill patients:  140 - 180 mg/dL   Review of Glycemic Control  Inpatient Diabetes Program Recommendations:    CBG's still high-possibly due to the Decadron given yesterday. If fastings remain high, please consider addition of lantus 10 units at HS while here.  Thank you Lenor CoffinAnn Vela Render, RN, MSN, CDE  Diabetes Inpatient Program Office: [phone] Pager: [phone] 8:00 am to 5:00 pm

## 2014-12-07 NOTE — Discharge Instructions (Signed)

## 2014-12-07 NOTE — Evaluation (Signed)
Occupational Therapy Evaluation Patient Details Name: Sara Stuart MRN: [id number] DOB: 06/03/1955 Today's Date: 12/07/2014    History of Present Illness 59 yo female s/p L3-4 PLIF PMH: HTN DM GERD Back surg 07/18/12   Clinical Impression   Patient evaluated by Occupational Therapy with no further acute OT needs identified. All education has been completed and the patient has no further questions. See below for any follow-up Occupational Therapy or equipment needs. OT to sign off. Thank you for referral.   Handout provided and reviewed in detail.All education is complete and patient indicates understanding.     Follow Up Recommendations  No OT follow up    Equipment Recommendations  None recommended by OT    Recommendations for Other Services       Precautions / Restrictions Precautions Precautions: Back      Mobility Bed Mobility               General bed mobility comments: sitting EOB on arrival.   Transfers Overall transfer level: Needs assistance Equipment used: Rolling walker (2 wheeled) Transfers: Sit to/from Stand Sit to Stand: Supervision              Balance Overall balance assessment: Needs assistance                             High Level Balance Comments: educated on fall risk with eye occlussion : dark / and shower             ADL Overall ADL's : Needs assistance/impaired Eating/Feeding: Independent   Grooming: Wash/dry hands;Wash/dry face;Oral care;Supervision/safety               Lower Body Dressing: Supervision/safety;Sit to/from stand Lower Body Dressing Details (indicate cue type and reason): able to cross bil LE and touch feet. Pt reports "i put on my own socks like this" Toilet Transfer: Copy Details (indicate cue type and reason): reports my husband made me rails for home by the toilet     Tub/ Shower Transfer: Walk-in shower;Min Chief Executive Officer Details (indicate  cue type and reason): educated on backing up to shower with RW and in with strong leg first. Pt with excellent return demo. pt with sway with eye occlusion and recommend family (A) for first few showers Functional mobility during ADLs: Supervision/safety;Rolling walker General ADL Comments: Pt with hx of R foot drop. pt reports never using brace and able to clear foot without deficits during this session. pt does report fall x1 month ago due to LE weakness.      Vision     Perception     Praxis      Pertinent Vitals/Pain Pain Assessment: Faces Faces Pain Scale: Hurts a little bit Pain Descriptors / Indicators: Operative site guarding     Hand Dominance Right   Extremity/Trunk Assessment Upper Extremity Assessment Upper Extremity Assessment: Overall WFL for tasks assessed   Lower Extremity Assessment Lower Extremity Assessment: Defer to PT evaluation   Cervical / Trunk Assessment Cervical / Trunk Assessment: Other exceptions (s/p surg)   Communication Communication Communication: No difficulties   Cognition Arousal/Alertness: Awake/alert Behavior During Therapy: WFL for tasks assessed/performed Overall Cognitive Status: Within Functional Limits for tasks assessed                     General Comments       Exercises       Shoulder Instructions  Home Living Family/patient expects to be discharged to:: Private residence Living Arrangements: Spouse/significant other Available Help at Discharge: Family;Available 24 hours/day Type of Home: Mobile home Home Access: Stairs to enter Entrance Stairs-Number of Steps: 3 Entrance Stairs-Rails: Can reach both Home Layout: One level     Bathroom Shower/Tub: Occupational psychologist: Standard     Home Equipment: Grab bars - toilet;Grab bars - tub/shower;Walker - 2 wheels;Cane - single point   Additional Comments: husband will be home to assist but has health issues. Wife reports cardiac history and  currently having chest pain but refusing to come to hosptial due to concerns that he wont be home to help wife. Couple decided to have wife come to hospital for surgery first because they have already met their deductable.       Prior Functioning/Environment Level of Independence: Independent with assistive device(s)             OT Diagnosis: Generalized weakness;Acute pain   OT Problem List:     OT Treatment/Interventions:      OT Goals(Current goals can be found in the care plan section)    OT Frequency:     Barriers to D/C:            Co-evaluation              End of Session Equipment Utilized During Treatment: Rolling walker Nurse Communication: Mobility status;Precautions  Activity Tolerance: Patient tolerated treatment well Patient left: in bed;with call bell/phone within reach   Time: 3235-5732 OT Time Calculation (min): 15 min Charges:  OT General Charges $OT Visit: 1 Procedure OT Evaluation $Initial OT Evaluation Tier I: 1 Procedure G-Codes:    Parke Poisson B 2015-01-05, 9:07 AM   Jeri Modena   OTR/L Pager: [phone] Office: [phone] .

## 2014-12-07 NOTE — Progress Notes (Signed)
Postop day 1. Patient doing very well. Minimal back pain. Ambulating the halls without difficulty. Feels much improved from preop.  Afebrile. Vitals are stable. Awake and alert. Oriented and appropriate. Motor and sensory function intact. Wound clean and dry. Chest and abdomen benign.  Doing well postop. Continue his immobilization. Probable discharge home tomorrow.

## 2014-12-07 NOTE — Evaluation (Signed)
Physical Therapy Evaluation Patient Details Name: Sara Stuart MRN: [id number] DOB: [birth year] Today's Date: 12/07/2014   History of Present Illness  59 yo female s/p L3-4 PLIF PMH: HTN DM GERD Back surg 07/18/12  Clinical Impression  Pt admitted with above diagnosis. Pt currently with functional limitations due to the deficits listed below (see PT Problem List). At the time of PT eval pt was able to perform transfers and ambulation with min guard to supervision for safety. Pt reports frequent falls at home, and recommended use of RW until follow-up at least before transitioning to use of the rollator. Pt will benefit from skilled PT to increase their independence and safety with mobility to allow discharge to the venue listed below.      Follow Up Recommendations Home health PT;Supervision for mobility/OOB    Equipment Recommendations  None recommended by PT    Recommendations for Other Services       Precautions / Restrictions Precautions Precautions: Back Precaution Booklet Issued: Yes (comment) Precaution Comments: OT issued, PT reviewed. Required Braces or Orthoses: Spinal Brace Spinal Brace: Lumbar corset;Applied in sitting position Restrictions Weight Bearing Restrictions: No      Mobility  Bed Mobility Overal bed mobility: Needs Assistance Bed Mobility: Rolling;Sidelying to Sit;Sit to Sidelying Rolling: Supervision Sidelying to sit: Min guard     Sit to sidelying: Min guard General bed mobility comments: Pt was able to perform transition to/from EOB without physical assist. Close guard for safety and maintenance of back precautions.   Transfers Overall transfer level: Needs assistance Equipment used: Rolling walker (2 wheeled) Transfers: Sit to/from Stand Sit to Stand: Supervision         General transfer comment: VC's for hand placement on seated surface for safety. Pt was able to power-up to full standing without assist.   Ambulation/Gait Ambulation/Gait  assistance: Supervision Ambulation Distance (Feet): 400 Feet Assistive device: Rolling walker (2 wheeled) Gait Pattern/deviations: Step-through pattern;Decreased stride length;Trunk flexed Gait velocity: Decreased Gait velocity interpretation: Below normal speed for age/gender General Gait Details: Pt was able to ambulate with RW and no physical assistance. Gait is antalgic due to history of polio however functional.   Stairs Stairs: Yes Stairs assistance: Min guard Stair Management: One rail Right;Sideways Number of Stairs: 4 General stair comments: Pt was able to negotiate 4 stairs with close guard for safety, however no physical assistance.   Wheelchair Mobility    Modified Rankin (Stroke Patients Only)       Balance Overall balance assessment: Needs assistance Sitting-balance support: Feet supported;No upper extremity supported Sitting balance-Leahy Scale: Fair     Standing balance support: No upper extremity supported;During functional activity Standing balance-Leahy Scale: Fair Standing balance comment: Pt stood at sink to wash hands without support.                High Level Balance Comments: educated on fall risk with eye occlussion : dark / and shower              Pertinent Vitals/Pain Pain Assessment: No/denies pain Faces Pain Scale: Hurts a little bit Pain Descriptors / Indicators: Operative site guarding;Discomfort    Home Living Family/patient expects to be discharged to:: Private residence Living Arrangements: Spouse/significant other Available Help at Discharge: Family;Available 24 hours/day Type of Home: Mobile home Home Access: Stairs to enter Entrance Stairs-Rails: Can reach both Entrance Stairs-Number of Steps: 3 Home Layout: One level Home Equipment: Grab bars - toilet;Grab bars - tub/shower;Walker - 2 wheels;Cane - single point;Walker - 4 wheels Additional  Comments: husband will be home to assist but has health issues. Wife reports  cardiac history and currently having chest pain but refusing to come to hosptial due to concerns that he wont be home to help wife. Couple decided to have wife come to hospital for surgery first because they have already met their deductable.     Prior Function Level of Independence: Independent with assistive device(s)         Comments: SPC normally, however has access to RW and 4WW for "bad days".      Hand Dominance   Dominant Hand: Right    Extremity/Trunk Assessment   Upper Extremity Assessment: Defer to OT evaluation           Lower Extremity Assessment: RLE deficits/detail RLE Deficits / Details: Pt with history of polio. Foot drop on R with prior surgical fixation.     Cervical / Trunk Assessment:  (s/p surg)  Communication   Communication: No difficulties  Cognition Arousal/Alertness: Awake/alert Behavior During Therapy: WFL for tasks assessed/performed Overall Cognitive Status: Within Functional Limits for tasks assessed                      General Comments      Exercises        Assessment/Plan    PT Assessment Patient needs continued PT services  PT Diagnosis Difficulty walking;Acute pain   PT Problem List Decreased strength;Decreased range of motion;Decreased activity tolerance;Decreased balance;Decreased mobility;Decreased knowledge of use of DME;Decreased safety awareness;Decreased knowledge of precautions;Pain  PT Treatment Interventions DME instruction;Gait training;Stair training;Functional mobility training;Therapeutic activities;Therapeutic exercise;Neuromuscular re-education;Patient/family education   PT Goals (Current goals can be found in the Care Plan section) Acute Rehab PT Goals Patient Stated Goal: Return home and be as independent as possible PT Goal Formulation: With patient/family Time For Goal Achievement: 12/14/14 Potential to Achieve Goals: Good    Frequency Min 5X/week   Barriers to discharge Decreased caregiver  support Per husband who was present at end of session, he is disabled and unable to provide much physical assistance.    Co-evaluation               End of Session Equipment Utilized During Treatment: Gait belt;Back brace Activity Tolerance: Patient tolerated treatment well Patient left: in bed;with call bell/phone within reach Nurse Communication: Mobility status         Time: 8118-8677 PT Time Calculation (min) (ACUTE ONLY): 21 min   Charges:   PT Evaluation $Initial PT Evaluation Tier I: 1 Procedure     PT G Codes:        Rolinda Roan 12/31/2014, 11:05 AM   Rolinda Roan, PT, DPT Acute Rehabilitation Services Pager: [phone]

## 2014-12-07 NOTE — Care Management Note (Addendum)
Case Management Note  Patient Details  Name: Sara Stuart MRN: [id number] Date of Birth: [birth year]  Subjective/Objective:  59 yr old female s/p L3-4 Laminectomy and decompression                  Action/Plan: Case manager spoke with patient concerning home health needs at discharge.  Choice was offered  [facility].. Patient will have family support at discharge.    Expected Discharge Date:   12/07/14               Expected Discharge Plan:   Home with Home Health  In-House Referral:  NA  Discharge planning Services  CM Consult  Post Acute Care Choice:  Home Health Choice offered to:     DME Arranged:  N/A DME Agency:  NA  HH Arranged:  PT HH Agency: [facility] Status of Service:  Completed, signed off  Medicare Important Message Given:    Date Medicare IM Given:    Medicare IM give by:    Date Additional Medicare IM Given:    Additional Medicare Important Message give by:     If discussed at Long Length of Stay Meetings, dates discussed:    Additional Comments:  Durenda GuthrieBrady, [name], RN 12/07/2014, 4:05 PM

## 2014-12-07 NOTE — Discharge Summary (Signed)
Physician Discharge Summary  Patient ID: Sara MusselGracie Chevez MRN: [id number] DOB/AGE: [birth year] 59 y.o.  Admit date: 12/06/2014 Discharge date: 12/07/2014  Admission Diagnoses:  Discharge Diagnoses:  Active Problems:   Degenerative spondylolisthesis   Discharged Condition: good  Hospital Course: The patient was admitted to the hospital where she underwent an uncomplicated lumbar decompression and fusion.  Postoperative she is doing very well.  Preoperative back and lower extremity pain much improved.  Standing and walking without difficulty.  Ready for discharge home.  Consults:   Significant Diagnostic Studies:   Treatments:   Discharge Exam: Blood pressure 99/58, pulse 65, temperature 98.3 F (36.8 C), temperature source Oral, resp. rate 18, height 5' (1.524 m), weight 86.637 kg (191 lb), SpO2 98 %. Awake and alert.  Oriented and appropriate.  Motor and sensory function intact.  Wound clean and dry.  Chest and abdomen benign.  Disposition: 01-Home or Self Care     Medication List    TAKE these medications        aspirin EC 81 MG tablet  Take 1 tablet (81 mg total) by mouth daily.     colchicine 0.6 MG tablet  Take 0.6 mg by mouth daily as needed (for gout pain).     diltiazem 120 MG 24 hr capsule  Commonly known as:  DILACOR XR  Take 120 mg by mouth daily.     glipiZIDE 5 MG 24 hr tablet  Commonly known as:  GLUCOTROL XL  Take 5 mg by mouth daily with breakfast.     HYDROcodone-acetaminophen 10-325 MG tablet  Commonly known as:  NORCO  Take 1 tablet by mouth every 6 (six) hours as needed.     ibuprofen 800 MG tablet  Commonly known as:  ADVIL,MOTRIN  Take 800 mg by mouth every 8 (eight) hours as needed for moderate pain.     metFORMIN 500 MG tablet  Commonly known as:  GLUCOPHAGE  Take 1,000 mg by mouth 2 (two) times daily with a meal.     omeprazole 40 MG capsule  Commonly known as:  PRILOSEC  Take 40 mg by mouth daily.     potassium chloride 10 MEQ tablet   Commonly known as:  K-DUR  Take 10 mEq by mouth daily.     triamterene-hydrochlorothiazide 37.5-25 MG capsule  Commonly known as:  DYAZIDE  Take 1 capsule by mouth daily.           Follow-up Information    Follow up with [facility].   Why:  Someone from [facility] will contact you concerning start date and time for therapy.   Contact information:   [address] [place] [address] [phone]       Follow up with Temple PaciniPOOL,Kainon Varady A, MD.   Specialty:  Neurosurgery   Contact information:   [address] [place] [address] [phone]       Signed: Temple PaciniOOL,Keil Pickering A 12/07/2014, 5:07 PM

## 2015-10-27 ENCOUNTER — Other Ambulatory Visit: Payer: Self-pay | Admitting: Obstetrics and Gynecology

## 2015-10-27 DIAGNOSIS — R928 Other abnormal and inconclusive findings on diagnostic imaging of breast: Secondary | ICD-10-CM

## 2015-11-02 ENCOUNTER — Ambulatory Visit
Admission: RE | Admit: 2015-11-02 | Discharge: 2015-11-02 | Disposition: A | Discharge status: Home / Self Care | Payer: Commercial Managed Care - PPO | Source: Ambulatory Visit | Attending: Obstetrics and Gynecology | Admitting: Obstetrics and Gynecology

## 2015-11-02 DIAGNOSIS — R928 Other abnormal and inconclusive findings on diagnostic imaging of breast: Secondary | ICD-10-CM

## 2016-09-18 ENCOUNTER — Telehealth: Payer: Self-pay | Admitting: Cardiology

## 2016-09-18 ENCOUNTER — Encounter: Payer: Self-pay | Admitting: *Deleted

## 2016-09-18 ENCOUNTER — Ambulatory Visit (INDEPENDENT_AMBULATORY_CARE_PROVIDER_SITE_OTHER): Payer: Commercial Managed Care - PPO | Admitting: Cardiology

## 2016-09-18 ENCOUNTER — Encounter: Payer: Self-pay | Admitting: Cardiology

## 2016-09-18 VITALS — BP 116/80 | HR 67 | Ht 60.0 in | Wt 210.2 lb

## 2016-09-18 DIAGNOSIS — R002 Palpitations: Secondary | ICD-10-CM

## 2016-09-18 DIAGNOSIS — R0602 Shortness of breath: Secondary | ICD-10-CM

## 2016-09-18 DIAGNOSIS — Z01810 Encounter for preprocedural cardiovascular examination: Secondary | ICD-10-CM

## 2016-09-18 NOTE — Telephone Encounter (Signed)
Pre-cert Verification for the following procedure   Lexiscan Myoview scheduled for 09-25-16 (2 day protocol)

## 2016-09-18 NOTE — Patient Instructions (Signed)
Your physician recommends that you schedule a follow-up appointment in: 1 MONTH WITH DR [facility]  Your physician recommends that you continue on your current medications as directed. Please refer to the Current Medication list given to you today.  Your physician has requested that you have a lexiscan myoview. For further information please visit [email or web address] Please follow instruction sheet, as given.  Your physician has recommended that you wear an event monitor FOR 2 WEEKS. Event monitors are medical devices that record the heart's electrical activity. Doctors most often [place] these monitors to diagnose arrhythmias. Arrhythmias are problems with the speed or rhythm of the heartbeat. The monitor is a small, portable device. You can wear one while you do your normal daily activities. This is usually used to diagnose what is causing palpitations/syncope (passing out).  Thank you for choosing [facility]!!

## 2016-09-18 NOTE — Progress Notes (Signed)
Clinical Summary Ms. Gent is a 61 y.o.female seen as new consult, referred by Dr Margo Common for Heffneralpitatoins.   1. Palpitations - increased over the last month. Feeling of heart pounding, needle like feeling. Can occur at rest or with activity.  - recently out weed eating, multiple episodes at that time. Can have some SOB, blurry vision. - lasts a few minutes. 5-6 times over the last month - just occasional coffee, no tea, recently changed to caffeine free soda without benefit, no energy drinks, no EtoH - reports recent labs with pcp  2. Preop evaluation - being considered for knee replacement - physically limited by chronic left weakness and pain - CAD risk factors: DM2, HTN     Past Medical History:  Diagnosis Date  . Arthritis   . Chronic back pain    spondylolishthesis  . Diabetes mellitus without complication (HCC) diagnosed in 02/2014   takes Metformin and Glipizide daily  . GERD (gastroesophageal reflux disease)    takes Omeprazole daily  . Gout    takes Colchicine daily as needed  . Hypertension    takes Dyazide and Diltiazem daily  . Nocturia   . Polio   . PONV (postoperative nausea and vomiting)      No Known Allergies   Current Outpatient Prescriptions  Medication Sig Dispense Refill  . aspirin EC 81 MG tablet Take 1 tablet (81 mg total) by mouth daily. 30 tablet   . atorvastatin (LIPITOR) 20 MG tablet Take 20 mg by mouth daily.    . clobetasol (TEMOVATE) 0.05 % external solution Apply 1 application topically 2 (two) times daily.    . colchicine 0.6 MG tablet Take 0.6 mg by mouth daily as needed (for gout pain).     . cyclobenzaprine (FLEXERIL) 10 MG tablet Take 10 mg by mouth 3 (three) times daily as needed for muscle spasms.    . diazepam (VALIUM) 5 MG tablet Take 5 mg by mouth every 12 (twelve) hours as needed for muscle spasms.    Marland Kitchen diltiazem (DILACOR XR) 120 MG 24 hr capsule Take 120 mg by mouth daily.    Marland Kitchen glipiZIDE (GLUCOTROL XL) 5 MG 24 hr tablet  Take 5 mg by mouth daily with breakfast.    . ibuprofen (ADVIL,MOTRIN) 800 MG tablet Take 800 mg by mouth every 8 (eight) hours as needed for moderate pain.    Marland Kitchen insulin NPH-regular Human (NOVOLIN 70/30) (70-30) 100 UNIT/ML injection Inject into the skin.    . metFORMIN (GLUCOPHAGE) 500 MG tablet Take 1,000 mg by mouth 2 (two) times daily with a meal.     . omeprazole (PRILOSEC) 40 MG capsule Take 40 mg by mouth daily.    Marland Kitchen oxyCODONE-acetaminophen (PERCOCET) 10-325 MG tablet Take 1 tablet by mouth every 4 (four) hours as needed for pain.    . potassium chloride (K-DUR) 10 MEQ tablet Take 10 mEq by mouth daily.    . ranitidine (ZANTAC) 75 MG tablet Take 75 mg by mouth 2 (two) times daily.    Marland Kitchen triamterene-hydrochlorothiazide (DYAZIDE) 37.5-25 MG per capsule Take 1 capsule by mouth daily.     No current facility-administered medications for this visit.      Past Surgical History:  Procedure Laterality Date  . ABDOMINAL HYSTERECTOMY     TUBAL PREGNANCY  . BACK SURGERY  07/2012  . bladder tacked     . COLONOSCOPY    . FOOT SURGERY     more than 5 surgeries on right foot  related to polio  . surgery for polio     states x 5   . TUBAL LIGATION       No Known Allergies    No family history on file.   Social History Ms. Dutson reports that she has never smoked. She has never used smokeless tobacco. Ms. Hayashi reports that she does not drink alcohol.   Review of Systems CONSTITUTIONAL: No weight loss, fever, chills, weakness or fatigue.  HEENT: Eyes: No visual loss, blurred vision, double vision or yellow sclerae.No hearing loss, sneezing, congestion, runny nose or sore throat.  SKIN: No rash or itching.  CARDIOVASCULAR: per hpi RESPIRATORY: some SOB with activities GASTROINTESTINAL: No anorexia, nausea, vomiting or diarrhea. No abdominal pain or blood.  GENITOURINARY: No burning on urination, no polyuria NEUROLOGICAL: No headache, dizziness, syncope, paralysis, ataxia, numbness or  tingling in the extremities. No change in bowel or bladder control.  MUSCULOSKELETAL: No muscle, back pain, joint pain or stiffness.  LYMPHATICS: No enlarged nodes. No history of splenectomy.  PSYCHIATRIC: No history of depression or anxiety.  ENDOCRINOLOGIC: No reports of sweating, cold or heat intolerance. No polyuria or polydipsia.  Marland Kitchen   Physical Examination Vitals:   09/18/16 1330  BP: 116/80  Pulse: 67  SpO2: 97%   Vitals:   09/18/16 1330  Weight: 210 lb 3.2 oz (95.3 kg)  Height: 5' (1.524 m)    Gen: resting comfortably, no acute distress HEENT: no scleral icterus, pupils equal round and reactive, no palptable cervical adenopathy,  CV: RRR, no m/r/g, no jvd Resp: Clear to auscultation bilaterally GI: abdomen is soft, non-tender, non-distended, normal bowel sounds, no hepatosplenomegaly MSK: extremities are warm, no edema.  Skin: warm, no rash Neuro:  no focal deficits Psych: appropriate affect   Diagnostic Studies  12/2012 echo Study Conclusions  - Left ventricle: The cavity size was normal. There was mild concentric hypertrophy. Systolic function was normal. The estimated ejection fraction was in the range of 55% to 60%. Wall motion was normal; there were no regional wall motion abnormalities. Doppler parameters are consistent with abnormal left ventricular relaxation (grade 1 diastolic dysfunction). The E/e' ratio is <10,. suggesting normal LV filling pressure. - Left atrium: The atrium was normal in size. - Inferior vena cava: The vessel was normal in size; the respirophasic diameter changes were in the normal range (= 50%); findings are consistent with normal central venous pressure.   Assessment and Plan  1. Palpitations - EKG in clinic today shows NSR - we will obtain a 2 week event monitor - wean caffeine. Request labs from pcp to see if K, Mg and TSH have been checked  2. Preoperative evaluation - unable to assess exercise  tolerance by history, she is limited by chronic knee pain and leg weakness. She does have CAD risk factors and some SOB with activities - obtain lexiscan MPI 2 day protocol to better risk stratify      Antoine Poche, M.D.

## 2016-09-21 ENCOUNTER — Encounter (INDEPENDENT_AMBULATORY_CARE_PROVIDER_SITE_OTHER): Payer: Commercial Managed Care - PPO

## 2016-09-21 DIAGNOSIS — R002 Palpitations: Secondary | ICD-10-CM | POA: Diagnosis not present

## 2016-09-25 ENCOUNTER — Encounter (HOSPITAL_COMMUNITY)
Admission: RE | Admit: 2016-09-25 | Discharge: 2016-09-25 | Disposition: A | Discharge status: Home / Self Care | Payer: Commercial Managed Care - PPO | Source: Ambulatory Visit | Attending: Cardiology | Admitting: Cardiology

## 2016-09-25 ENCOUNTER — Encounter (HOSPITAL_BASED_OUTPATIENT_CLINIC_OR_DEPARTMENT_OTHER)
Admission: RE | Admit: 2016-09-25 | Discharge: 2016-09-25 | Disposition: A | Discharge status: Home / Self Care | Payer: Commercial Managed Care - PPO | Source: Ambulatory Visit | Attending: Cardiology | Admitting: Cardiology

## 2016-09-25 DIAGNOSIS — R0602 Shortness of breath: Secondary | ICD-10-CM | POA: Diagnosis not present

## 2016-09-25 MED ORDER — REGADENOSON 0.4 MG/5ML IV SOLN
INTRAVENOUS | Status: AC
  Administered 2016-09-25: 0.4 mg via INTRAVENOUS
  Filled 2016-09-25: qty 5

## 2016-09-25 MED ORDER — SODIUM CHLORIDE 0.9% FLUSH
INTRAVENOUS | Status: AC
  Administered 2016-09-25: 10 mL via INTRAVENOUS
  Filled 2016-09-25: qty 10

## 2016-09-26 ENCOUNTER — Ambulatory Visit (HOSPITAL_COMMUNITY)
Admission: RE | Admit: 2016-09-26 | Discharge: 2016-09-26 | Disposition: A | Discharge status: Home / Self Care | Payer: Commercial Managed Care - PPO | Source: Ambulatory Visit | Attending: Cardiology | Admitting: Cardiology

## 2016-09-26 ENCOUNTER — Encounter (HOSPITAL_COMMUNITY): Payer: Self-pay

## 2016-09-26 DIAGNOSIS — R0602 Shortness of breath: Secondary | ICD-10-CM | POA: Insufficient documentation

## 2016-09-26 LAB — NM MYOCAR MULTI W/SPECT W/WALL MOTION / EF
LV dias vol: 72 mL (ref 46–106)
LV sys vol: 17 mL
Peak HR: 114 {beats}/min
RATE: 0.35
Rest HR: 75 {beats}/min
SDS: 0
SRS: 0
SSS: 0
TID: 0.8

## 2016-09-26 MED ORDER — TECHNETIUM TC 99M TETROFOSMIN IV KIT
25.0000 | PACK | Freq: Once | INTRAVENOUS | Status: AC | PRN
  Administered 2016-09-26: 27 via INTRAVENOUS

## 2016-10-02 ENCOUNTER — Telehealth: Payer: Self-pay | Admitting: *Deleted

## 2016-10-02 NOTE — Telephone Encounter (Signed)
Pt aware - routed to pcp  

## 2016-10-02 NOTE — Telephone Encounter (Signed)
-----   Message from Antoine Poche, MD sent at 10/01/2016  2:43 PM EDT ----- Normal stress test, no evidence of blockages    J BrancH MD

## 2016-10-19 ENCOUNTER — Encounter: Payer: Self-pay | Admitting: Cardiology

## 2016-10-19 ENCOUNTER — Ambulatory Visit (INDEPENDENT_AMBULATORY_CARE_PROVIDER_SITE_OTHER): Payer: Commercial Managed Care - PPO | Admitting: Cardiology

## 2016-10-19 ENCOUNTER — Telehealth: Payer: Self-pay | Admitting: *Deleted

## 2016-10-19 VITALS — BP 100/78 | HR 85 | Ht 60.0 in | Wt 213.2 lb

## 2016-10-19 DIAGNOSIS — R002 Palpitations: Secondary | ICD-10-CM

## 2016-10-19 DIAGNOSIS — Z01810 Encounter for preprocedural cardiovascular examination: Secondary | ICD-10-CM

## 2016-10-19 NOTE — Telephone Encounter (Signed)
Pt made aware at 10/19 office visit. Routed to pcp

## 2016-10-19 NOTE — Patient Instructions (Signed)
Your physician recommends that you schedule a follow-up appointment in: AS NEEDED WITH DR BRANCH  Your physician recommends that you continue on your current medications as directed. Please refer to the Current Medication list given to you today.  Thank you for choosing [facility]!!    

## 2016-10-19 NOTE — Progress Notes (Signed)
Clinical Summary Sara Stuart is a 61 y.o.female seen today for follow up of the following medical problem.s   1. Palpitations - 09/2016 14 day monitor: NSR, occasional PACs - no recurrent spells since last visit.      2. Preop evaluation - being considered for knee replacement - physically limited by chronic left weakness and pain - CAD risk factors: DM2, HTN  - 09/2016 nuclear stress: no ischemia Past Medical History:  Diagnosis Date  . Arthritis   . Chronic back pain    spondylolishthesis  . Diabetes mellitus without complication (HCC) diagnosed in 02/2014   takes Metformin and Glipizide daily  . GERD (gastroesophageal reflux disease)    takes Omeprazole daily  . Gout    takes Colchicine daily as needed  . Hypertension    takes Dyazide and Diltiazem daily  . Nocturia   . Polio   . PONV (postoperative nausea and vomiting)      No Known Allergies   Current Outpatient Prescriptions  Medication Sig Dispense Refill  . aspirin EC 81 MG tablet Take 1 tablet (81 mg total) by mouth daily. 30 tablet   . colchicine 0.6 MG tablet Take 0.6 mg by mouth daily as needed (for gout pain).     . diazepam (VALIUM) 5 MG tablet Take 5 mg by mouth daily as needed for muscle spasms.     Marland Kitchen diltiazem (DILACOR XR) 120 MG 24 hr capsule Take 120 mg by mouth daily.    . diphenoxylate-atropine (LOMOTIL) 2.5-0.025 MG tablet Take 1 tablet by mouth as needed for diarrhea or loose stools.    Marland Kitchen glipiZIDE (GLUCOTROL XL) 5 MG 24 hr tablet Take 5 mg by mouth daily with breakfast.    . ibuprofen (ADVIL,MOTRIN) 800 MG tablet Take 800 mg by mouth every 8 (eight) hours as needed for moderate pain.    Marland Kitchen insulin NPH-regular Human (HUMULIN 70/30) (70-30) 100 UNIT/ML injection Inject 40 Units into the skin 2 (two) times daily.    . metFORMIN (GLUCOPHAGE) 500 MG tablet Take 1,000 mg by mouth 2 (two) times daily with a meal.     . omeprazole (PRILOSEC) 40 MG capsule Take 40 mg by mouth daily.    . potassium  chloride (K-DUR) 10 MEQ tablet Take 10 mEq by mouth daily.    Marland Kitchen triamterene-hydrochlorothiazide (DYAZIDE) 37.5-25 MG per capsule Take 0.5 capsules by mouth daily.      No current facility-administered medications for this visit.      Past Surgical History:  Procedure Laterality Date  . ABDOMINAL HYSTERECTOMY     TUBAL PREGNANCY  . BACK SURGERY  07/2012  . bladder tacked     . COLONOSCOPY    . FOOT SURGERY     more than 5 surgeries on right foot related to polio  . surgery for polio     states x 5   . TUBAL LIGATION       No Known Allergies    No family history on file.   Social History Sara Stuart reports that she has never smoked. She has never used smokeless tobacco. Sara Stuart reports that she does not drink alcohol.   Review of Systems CONSTITUTIONAL: No weight loss, fever, chills, weakness or fatigue.  HEENT: Eyes: No visual loss, blurred vision, double vision or yellow sclerae.No hearing loss, sneezing, congestion, runny nose or sore throat.  SKIN: No rash or itching.  CARDIOVASCULAR: per hpi RESPIRATORY: No shortness of breath, cough or sputum.  GASTROINTESTINAL: No  anorexia, nausea, vomiting or diarrhea. No abdominal pain or blood.  GENITOURINARY: No burning on urination, no polyuria NEUROLOGICAL: No headache, dizziness, syncope, paralysis, ataxia, numbness or tingling in the extremities. No change in bowel or bladder control.  MUSCULOSKELETAL: No muscle, back pain, joint pain or stiffness.  LYMPHATICS: No enlarged nodes. No history of splenectomy.  PSYCHIATRIC: No history of depression or anxiety.  ENDOCRINOLOGIC: No reports of sweating, cold or heat intolerance. No polyuria or polydipsia.  Marland Kitchen.   Physical Examination Vitals:   10/19/16 1448  BP: 100/78  Pulse: 85  SpO2: 93%   Vitals:   10/19/16 1448  Weight: 213 lb 3.2 oz (96.7 kg)  Height: 5' (1.524 m)    Gen: resting comfortably, no acute distress HEENT: no scleral icterus, pupils equal round and  reactive, no palptable cervical adenopathy,  CV: RRR, no m/r/g, no jvd Resp: Clear to auscultation bilaterally GI: abdomen is soft, non-tender, non-distended, normal bowel sounds, no hepatosplenomegaly MSK: extremities are warm, no edema.  Skin: warm, no rash Neuro:  no focal deficits Psych: appropriate affect   Diagnostic Studies  12/2012 echo Study Conclusions  - Left ventricle: The cavity size was normal. There was mild concentric hypertrophy. Systolic function was normal. The estimated ejection fraction was in the range of 55% to 60%. Wall motion was normal; there were no regional wall motion abnormalities. Doppler parameters are consistent with abnormal left ventricular relaxation (grade 1 diastolic dysfunction). The E/e' ratio is <10,. suggesting normal LV filling pressure. - Left atrium: The atrium was normal in size. - Inferior vena cava: The vessel was normal in size; the respirophasic diameter changes were in the normal range (= 50%); findings are consistent with normal central venous pressure.  09/2016 Cardiac monitor  14 day event monitor  Telemetry tracings show sinus rhythm with occasional PACs  No symptoms reported, no significant arrhythmias   09/2016 Nuclear stress  There was no ST segment deviation noted during stress.  The study is normal. There are no perfusion defects  This is a low risk study.  The left ventricular ejection fraction is hyperdynamic (>65%).  Assessment and Plan   1. Palpitations - benign monitor, symptoms have resolved - continue to monitor. We will check BMET/Mg/TSH  2. Preoperative evaluation -recent nuclear stress test without ischemia, low risk - recommend proceeding with surgery as planned  F/u as needed   Antoine PocheJonathan F. Branch, M.D.

## 2016-10-19 NOTE — Telephone Encounter (Signed)
-----   Message from Antoine PocheJonathan F Branch, MD sent at 10/17/2016  1:46 PM EDT ----- Normal heart monitor, will discuss at our f/u this week  Dominga FerryJ Branch MD

## 2016-11-16 LAB — HEMOGLOBIN A1C: Hemoglobin A1C: 11.8

## 2017-01-09 LAB — HEMOGLOBIN A1C: Hemoglobin A1C: 10.2

## 2017-01-25 NOTE — Pre-Procedure Instructions (Signed)
[facility]ruceGracie [name]  01/25/2017      [facility] [address] - [place], [place] - [address] [place] [address] Phone: [phone] Fax: [phone]    Your procedure is scheduled on February 1  [facility] at 0530 A.M.  Call this number if you have problems the morning of surgery:  [phone]   Remember:  Do not eat food or drink liquids after midnight.  Continue all medications as directed by your physician except follow these medication instructions before surgery below   Take these medicines the morning of surgery with A SIP OF WATER  Colchicine if needed diltiazem (DILACOR XR) omeprazole (PRILOSEC)  7 days prior to surgery STOP taking any Aspirin(unless otherwise instructed by your surgeon), Aleve, Naproxen, Ibuprofen, Motrin, Advil, Goody's, BC's, all herbal medications, fish oil, and all vitamins   WHAT DO I DO ABOUT MY DIABETES MEDICATION?   Marland Kitchen. Do not take oral diabetes medicines (pills) the morning of surgery. metFORMIN (GLUCOPHAGE)   . THE NIGHT BEFORE SURGERY, take ____28_______ units of __insulin NPH-regular Human (HUMULIN 70/30)_________insulin.       . THE MORNING OF SURGERY, take ______0_______ units of ___insulin NPH-regular Human (HUMULIN 70/30)_______insulin.  . The day of surgery, do not take other diabetes injectables, including Byetta (exenatide), Bydureon (exenatide ER), Victoza (liraglutide), or Trulicity (dulaglutide).  . If your CBG is greater than 220 mg/dL, you may take  of your sliding scale (correction) dose of insulin.   How to Manage Your Diabetes Before and After Surgery  Why is it important to control my blood sugar before and after surgery? . Improving blood sugar levels before and after surgery helps healing and can limit problems. . A way of improving blood sugar control is eating a healthy diet by: o  Eating less sugar and carbohydrates o  Increasing activity/exercise o   Talking with your doctor about reaching your blood sugar goals . High blood sugars (greater than 180 mg/dL) can raise your risk of infections and slow your recovery, so you will need to focus on controlling your diabetes during the weeks before surgery. . Make sure that the doctor who takes care of your diabetes knows about your planned surgery including the date and location.  How do I manage my blood sugar before surgery? . Check your blood sugar at least 4 times a day, starting 2 days before surgery, to make sure that the level is not too high or low. o Check your blood sugar the morning of your surgery when you wake up and every 2 hours until you get to the Short Stay unit. . If your blood sugar is less than 70 mg/dL, you will need to treat for low blood sugar: o Do not take insulin. o Treat a low blood sugar (less than 70 mg/dL) with  cup of clear juice (cranberry or apple), 4 glucose tablets, OR glucose gel. o Recheck blood sugar in 15 minutes after treatment (to make sure it is greater than 70 mg/dL). If your blood sugar is not greater than 70 mg/dL on recheck, call [phone] for further instructions. . Report your blood sugar to the short stay nurse when you get to Short Stay.  . If you are admitted to the hospital after surgery: o Your blood sugar will be checked by the staff and you will probably be given insulin after surgery (instead of oral diabetes medicines) to make sure you have good blood sugar levels.  o The goal for blood sugar control after surgery is 80-180 mg/dL.     Do not wear jewelry, make-up or nail polish.  Do not wear lotions, powders, or perfumes, or deodorant.  Do not shave 48 hours prior to surgery.    Do not bring valuables to the hospital.  [facility] is not responsible for any belongings or valuables.  Contacts, dentures or bridgework may not be worn into surgery.  Leave your suitcase in the car.  After surgery it may be brought to your room.  For  patients admitted to the hospital, discharge time will be determined by your treatment team.  Patients discharged the day of surgery will not be allowed to drive home.    Special instructions:   [place]- Preparing For Surgery  Before surgery, you can play an important role. Because skin is not sterile, your skin needs to be as free of germs as possible. You can reduce the number of germs on your skin by washing with CHG (chlorahexidine gluconate) Soap before surgery.  CHG is an antiseptic cleaner which kills germs and bonds with the skin to continue killing germs even after washing.  Please do not use if you have an allergy to CHG or antibacterial soaps. If your skin becomes reddened/irritated stop using the CHG.  Do not shave (including legs and underarms) for at least 48 hours prior to first CHG shower. It is OK to shave your face.  Please follow these instructions carefully.   1. Shower the NIGHT BEFORE SURGERY and the MORNING OF SURGERY with CHG.   2. If you chose to wash your hair, wash your hair first as usual with your normal shampoo.  3. After you shampoo, rinse your hair and body thoroughly to remove the shampoo.  4. Use CHG as you would any other liquid soap. You can apply CHG directly to the skin and wash gently with a scrungie or a clean washcloth.   5. Apply the CHG Soap to your body ONLY FROM THE NECK DOWN.  Do not use on open wounds or open sores. Avoid contact with your eyes, ears, mouth and genitals (private parts). Wash Face and genitals (private parts)  with your normal soap.  6. Wash thoroughly, paying special attention to the area where your surgery will be performed.  7. Thoroughly rinse your body with warm water from the neck down.  8. DO NOT shower/wash with your normal soap after using and rinsing off the CHG Soap.  9. Pat yourself dry with a CLEAN TOWEL.  10. Wear CLEAN PAJAMAS to bed the night before surgery, wear comfortable clothes the morning of  surgery  11. Place CLEAN SHEETS on your bed the night of your first shower and DO NOT SLEEP WITH PETS.    Day of Surgery: Do not apply any deodorants/lotions. Please wear clean clothes to the hospital/surgery center.      Please read over the following fact sheets that you were given.

## 2017-01-28 ENCOUNTER — Encounter (HOSPITAL_COMMUNITY): Payer: Self-pay

## 2017-01-28 ENCOUNTER — Encounter (HOSPITAL_COMMUNITY)
Admission: RE | Admit: 2017-01-28 | Discharge: 2017-01-28 | Disposition: A | Discharge status: Home / Self Care | Payer: Commercial Managed Care - PPO | Source: Ambulatory Visit | Attending: Orthopedic Surgery | Admitting: Orthopedic Surgery

## 2017-01-28 ENCOUNTER — Other Ambulatory Visit: Payer: Self-pay

## 2017-01-28 DIAGNOSIS — Z7982 Long term (current) use of aspirin: Secondary | ICD-10-CM | POA: Diagnosis not present

## 2017-01-28 DIAGNOSIS — K219 Gastro-esophageal reflux disease without esophagitis: Secondary | ICD-10-CM | POA: Insufficient documentation

## 2017-01-28 DIAGNOSIS — I10 Essential (primary) hypertension: Secondary | ICD-10-CM | POA: Insufficient documentation

## 2017-01-28 DIAGNOSIS — Z79899 Other long term (current) drug therapy: Secondary | ICD-10-CM | POA: Insufficient documentation

## 2017-01-28 DIAGNOSIS — M79662 Pain in left lower leg: Secondary | ICD-10-CM | POA: Diagnosis not present

## 2017-01-28 DIAGNOSIS — G8929 Other chronic pain: Secondary | ICD-10-CM | POA: Diagnosis not present

## 2017-01-28 DIAGNOSIS — Z01818 Encounter for other preprocedural examination: Secondary | ICD-10-CM | POA: Insufficient documentation

## 2017-01-28 DIAGNOSIS — Z794 Long term (current) use of insulin: Secondary | ICD-10-CM | POA: Insufficient documentation

## 2017-01-28 DIAGNOSIS — E119 Type 2 diabetes mellitus without complications: Secondary | ICD-10-CM | POA: Diagnosis not present

## 2017-01-28 HISTORY — DX: Other intervertebral disc degeneration, lumbosacral region without mention of lumbar back pain or lower extremity pain: M51.379

## 2017-01-28 HISTORY — DX: Presence of spectacles and contact lenses: Z97.3

## 2017-01-28 HISTORY — DX: Other intervertebral disc degeneration, lumbosacral region: M51.37

## 2017-01-28 LAB — GLUCOSE, CAPILLARY: Glucose-Capillary: 281 mg/dL — ABNORMAL HIGH (ref 65–99)

## 2017-01-28 LAB — BASIC METABOLIC PANEL
Anion gap: 11 (ref 5–15)
BUN: 12 mg/dL (ref 6–20)
CO2: 25 mmol/L (ref 22–32)
Calcium: 9.1 mg/dL (ref 8.9–10.3)
Chloride: 100 mmol/L — ABNORMAL LOW (ref 101–111)
Creatinine, Ser: 0.78 mg/dL (ref 0.44–1.00)
GFR calc Af Amer: >60 mL/min (ref >60)
GFR calc non Af Amer: >60 mL/min (ref >60)
Glucose, Bld: 323 mg/dL — ABNORMAL HIGH (ref 65–99)
Potassium: 3.6 mmol/L (ref 3.5–5.1)
Sodium: 136 mmol/L (ref 135–145)

## 2017-01-28 LAB — CBC
HCT: 39.3 % (ref 36.0–46.0)
Hemoglobin: 12.8 g/dL (ref 12.0–15.0)
MCH: 27.3 pg (ref 26.0–34.0)
MCHC: 32.6 g/dL (ref 30.0–36.0)
MCV: 83.8 fL (ref 78.0–100.0)
Platelets: 275 10*3/uL (ref 150–400)
RBC: 4.69 MIL/uL (ref 3.87–5.11)
RDW: 13.8 % (ref 11.5–15.5)
WBC: 8.4 10*3/uL (ref 4.0–10.5)

## 2017-01-28 LAB — SURGICAL PCR SCREEN
MRSA, PCR: NEGATIVE
Staphylococcus aureus: POSITIVE — AB

## 2017-01-28 NOTE — Progress Notes (Addendum)
PCP: Dr. Margo Commonapper Cardiologist: Dr. Donnalee CurryBranch--pt reports she only saw for pre-op evaluation as requested by Dr. Ranell PatrickNorris.  Pt's original surgery cancelled/rescheduled due to spouse's health issues. Clearance note Epic 10/19/2016  EKG: 09/18/2016 CXR: denies ECHO: 2014 Stress Test: 2018 Cardiac Cath: Denies  Fasting Blood Sugar- "107-160" CBG at appt 281, pt had not taken morning medication.  Last A1C in CareEverywhere: 10.2 on Jan 09, 2017.  Patient denies shortness of breath, fever, cough, and chest pain at PAT appointment.  Patient verbalized understanding of instructions provided today at the PAT appointment.  Patient asked to review instructions at home and day of surgery.   MD's office called x2 requesting orders for PAT appt. Dr. Ranell PatrickNorris also sent IB message.  Pt is aware she will sign consent DOS and may also need additional blood work.  Labs obtained today per anesthesia standing orders.

## 2017-01-30 ENCOUNTER — Encounter (HOSPITAL_COMMUNITY): Payer: Self-pay | Admitting: Emergency Medicine

## 2017-01-30 NOTE — Progress Notes (Signed)
Anesthesia Chart Review:  Pt is a 62 year old female scheduled for L total knee arthroplasty on 02/01/2017 with Sara LowSteve Norris, MD   - PCP is Sara Loraavid Tapper, MD (notes in care everywhere) .  - Saw cardiologist Sara RichJonathan Branch, MD this past fall for palpitations. Last office visit 10/19/16, prn f/u recommended.  Pt cleared for surgery at last office visit.  PMH includes:  HTN, DM, GERD, post-op N/V. Never smoker. BMI 42. S/p PLIF 12/06/14. S/p lumbar fusion 07/08/12.   Medications include: ASA 81mg  , diltiazem, humulin 70/30, metformin, prilosec, potassium, triamterene-hctz.   BP 125/77   Pulse 73   Temp 37.1 C   Resp 20   Ht 5' (1.524 m)   Wt 213 lb 14.4 oz (97 kg)   SpO2 97%   BMI 41.77 kg/m    Preoperative labs reviewed.   - Glucose 323. HbA1c was 10.2 on 01/09/17.   EKG 09/18/16:  - Sinus  Bradycardia. Stuart voltage in precordial leads. Anteroseptal infarct -age undetermined.   Cardiac event monitor 10/16/16:   14 day event monitor  Telemetry tracings show sinus rhythm with occasional PACs  No symptoms reported, no significant arrhythmias  Nuclear stress test 09/26/16:   There was no ST segment deviation noted during stress.  The study is normal. There are no perfusion defects  This is a Stuart risk study.  The left ventricular ejection fraction is hyperdynamic (>65%).   Echo 12/30/12:  - Left ventricle: The cavity size was normal. There was mild concentric hypertrophy. Systolic function was normal. The estimated ejection fraction was in the range of 55% to 60%. Wall motion was normal; there were no regional wall motion abnormalities. Doppler parameters are consistent with abnormal left ventricular relaxation (grade 1 diastolic dysfunction). The E/e' ratio is <10,. Suggesting normal LV filling pressure. - Left atrium: The atrium was normal in size. - Inferior vena cava: The vessel was normal in size; the respirophasic diameter changes were in the normal range (=50%); findings  are consistent with normal central venous pressure.  I notified Sara Stuart in Dr. Ranell Stuart' office of pt's uncontrolled DM.   If blood glucose acceptable day of surgery, I anticipate pt can proceed with surgery as scheduled.   Sara Mastngela Eshaan Titzer, FNP-BC [facility] Phone: [phone] 01/30/2017 3:36 PM

## 2017-02-01 ENCOUNTER — Inpatient Hospital Stay (HOSPITAL_COMMUNITY)
Admission: RE | Admit: 2017-02-01 | Payer: Commercial Managed Care - PPO | Source: Ambulatory Visit | Admitting: Orthopedic Surgery

## 2017-02-01 ENCOUNTER — Encounter (HOSPITAL_COMMUNITY): Admission: RE | Payer: Self-pay | Source: Ambulatory Visit

## 2017-02-01 SURGERY — ARTHROPLASTY, KNEE, TOTAL
Anesthesia: Choice | Site: Knee | Laterality: Left

## 2017-03-14 ENCOUNTER — Encounter: Payer: Self-pay | Admitting: "Endocrinology

## 2017-03-14 ENCOUNTER — Ambulatory Visit: Payer: Commercial Managed Care - PPO | Admitting: "Endocrinology

## 2017-03-14 VITALS — BP 137/82 | HR 80 | Ht 60.0 in | Wt 207.0 lb

## 2017-03-14 DIAGNOSIS — Z794 Long term (current) use of insulin: Secondary | ICD-10-CM | POA: Insufficient documentation

## 2017-03-14 DIAGNOSIS — I10 Essential (primary) hypertension: Secondary | ICD-10-CM

## 2017-03-14 DIAGNOSIS — E1159 Type 2 diabetes mellitus with other circulatory complications: Secondary | ICD-10-CM

## 2017-03-14 DIAGNOSIS — E1165 Type 2 diabetes mellitus with hyperglycemia: Secondary | ICD-10-CM | POA: Insufficient documentation

## 2017-03-14 LAB — GLUCOSE, POCT (MANUAL RESULT ENTRY): POC Glucose: 171 mg/dl — AB (ref 70–99)

## 2017-03-14 MED ORDER — GLUCOSE BLOOD VI STRP: 1.0000 | ORAL_STRIP | Freq: Four times a day (QID) | 5 refills | Status: DC

## 2017-03-14 NOTE — Patient Instructions (Signed)

## 2017-03-14 NOTE — Progress Notes (Signed)
Endocrinology Consult Note       03/14/2017, 8:14 PM   Subjective:    Patient ID: Sara Stuart, female    DOB: [birth year].  Sara Stuart is being seen in consultation for management of currently uncontrolled symptomatic diabetes requested by  Louie Boston., MD.   Past Medical History:  Diagnosis Date  . Arthritis   . Chronic back pain    spondylolishthesis  . DDD (degenerative disc disease), lumbosacral   . Diabetes mellitus without complication (HCC) diagnosed in 02/2014   takes Metformin and Glipizide daily  . GERD (gastroesophageal reflux disease)    takes Omeprazole daily  . Gout    takes Colchicine daily as needed  . Hypertension    takes Dyazide and Diltiazem daily  . Nocturia   . Polio   . PONV (postoperative nausea and vomiting)   . Wears glasses    Past Surgical History:  Procedure Laterality Date  . ABDOMINAL HYSTERECTOMY     TUBAL PREGNANCY  . BACK SURGERY  07/2012  . bladder tacked     . COLONOSCOPY    . FOOT SURGERY     more than 5 surgeries on right foot related to polio  . surgery for polio     states x 5   . TUBAL LIGATION     Social History   Socioeconomic History  . Marital status: Married    Spouse name: None  . Number of children: None  . Years of education: None  . Highest education level: None  Social Needs  . Financial resource strain: None  . Food insecurity - worry: None  . Food insecurity - inability: None  . Transportation needs - medical: None  . Transportation needs - non-medical: None  Occupational History  . None  Tobacco Use  . Smoking status: Never Smoker  . Smokeless tobacco: Never Used  Substance and Sexual Activity  . Alcohol use: No  . Drug use: No  . Sexual activity: Yes    Birth control/protection: Surgical  Other Topics Concern  . None  Social History Narrative  . None   Outpatient Encounter Medications as of  03/14/2017  Medication Sig  . glucose blood test strip 1 each by Other route 4 (four) times daily. Use as instructed qid E11.65 Relion glucometer  . [DISCONTINUED] glucose blood test strip 1 each by Other route 4 (four) times daily. Use as instructed qid E11.65 Relion glucometer  . aspirin EC 81 MG tablet Take 1 tablet (81 mg total) by mouth daily.  . colchicine 0.6 MG tablet Take 0.6 mg by mouth daily as needed (for gout pain).   Marland Kitchen diltiazem (DILACOR XR) 120 MG 24 hr capsule Take 120 mg by mouth daily.  Marland Kitchen ibuprofen (ADVIL,MOTRIN) 800 MG tablet Take 800 mg by mouth every 8 (eight) hours as needed for moderate pain.  Marland Kitchen insulin NPH-regular Human (HUMULIN 70/30) (70-30) 100 UNIT/ML injection Inject 50 Units into the skin 2 (two) times daily before a meal.  . loperamide (IMODIUM) 2 MG capsule Take 2 mg by mouth as needed for diarrhea or loose stools.  . metFORMIN (GLUCOPHAGE) 500  MG tablet Take 1,000 mg by mouth 2 (two) times daily with a meal.   . omeprazole (PRILOSEC) 40 MG capsule Take 40 mg by mouth daily.  . potassium chloride (K-DUR) 10 MEQ tablet Take 10 mEq by mouth daily.  Marland Kitchen triamterene-hydrochlorothiazide (DYAZIDE) 37.5-25 MG per capsule Take 0.5 capsules by mouth daily.    No facility-administered encounter medications on file as of 03/14/2017.     ALLERGIES: No Known Allergies  VACCINATION STATUS: Immunization History  Administered Date(s) Administered  . Influenza,trivalent, recombinat, inj, PF 10/02/2014    Diabetes  She presents for her initial diabetic visit. She has type 2 diabetes mellitus. Onset time: she was diagnosed at approx age of 54 years. Her disease course has been worsening. There are no hypoglycemic associated symptoms. Pertinent negatives for hypoglycemia include no confusion, headaches, pallor or seizures. Associated symptoms include blurred vision, fatigue, polydipsia and polyuria. Pertinent negatives for diabetes include no chest pain and no polyphagia. There  are no hypoglycemic complications. Symptoms are worsening. Diabetic complications include heart disease. Risk factors for coronary artery disease include diabetes mellitus, obesity, sedentary lifestyle and post-menopausal. Current diabetic treatment includes insulin injections and oral agent (monotherapy). Her weight is stable. She is following a generally unhealthy diet. When asked about meal planning, she reported none. She has not had a previous visit with a dietitian. She never participates in exercise. Her home blood glucose trend is increasing steadily. Her overall blood glucose range is >200 mg/dl. (She brought a log showing persistently high BG readings, her recent a1c was 10.2%. Prior a1c s 10.2% and 11.8%. ) An ACE inhibitor/angiotensin II receptor blocker is not being taken. She does not see a podiatrist.Eye exam is not current.  Hypertension  This is a chronic problem. The current episode started more than 1 year ago. Associated symptoms include blurred vision. Pertinent negatives include no chest pain, headaches, palpitations or shortness of breath. Risk factors for coronary artery disease include diabetes mellitus, obesity, sedentary lifestyle and family history. Past treatments include diuretics. Hypertensive end-organ damage includes CAD/MI.      Review of Systems  Constitutional: Positive for fatigue. Negative for chills, fever and unexpected weight change.  HENT: Negative for trouble swallowing and voice change.   Eyes: Positive for blurred vision. Negative for visual disturbance.  Respiratory: Negative for cough, shortness of breath and wheezing.   Cardiovascular: Negative for chest pain, palpitations and leg swelling.  Gastrointestinal: Negative for diarrhea, nausea and vomiting.  Endocrine: Positive for polydipsia and polyuria. Negative for cold intolerance, heat intolerance and polyphagia.  Musculoskeletal: Positive for gait problem. Negative for arthralgias and myalgias.        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 Uses a cane to ambulate.  Skin: Negative for color change, pallor, rash and wound.  Neurological: Negative for seizures and headaches.  Psychiatric/Behavioral: Negative for confusion and suicidal ideas.    Objective:    BP 137/82   Pulse 80   Ht 5' (1.524 m)   Wt 207 lb (93.9 kg)   BMI 40.43 kg/m   Wt Readings from Last 3 Encounters:  03/14/17 207 lb (93.9 kg)  01/28/17 213 lb 14.4 oz (97 kg)  10/19/16 213 lb 3.2 oz (96.7 kg)     Physical Exam  Constitutional: She is oriented to person, place, and time. She appears well-developed.  Uses  A cane to walk  HENT:  Head: Normocephalic and atraumatic.  Eyes: EOM are normal.  Neck: Normal range of motion. Neck  supple. No tracheal deviation present. No thyromegaly present.  Cardiovascular: Normal rate and regular rhythm.  Pulmonary/Chest: Effort normal and breath sounds normal.  Abdominal: Soft. Bowel sounds are normal. There is no tenderness. There is no guarding.  Musculoskeletal: Normal range of motion. She exhibits deformity. She exhibits no edema.  Polio effect on Rt LE  Neurological: She is alert and oriented to person, place, and time. She has normal reflexes. No cranial nerve deficit. Coordination normal.  Skin: Skin is warm and dry. No rash noted. No erythema. No pallor.  Psychiatric: She has a normal mood and affect. Judgment normal.     CMP ( most recent) CMP     Component Value Date/Time   NA 136 01/28/2017 1609   K 3.6 01/28/2017 1609   CL 100 (L) 01/28/2017 1609   CO2 25 01/28/2017 1609   GLUCOSE 323 (H) 01/28/2017 1609   BUN 12 01/28/2017 1609   CREATININE 0.78 01/28/2017 1609   CALCIUM 9.1 01/28/2017 1609   PROT 7.9 12/29/2012 1400   ALBUMIN 3.9 12/29/2012 1400   AST 45 (H) 12/29/2012 1400   ALT 34 12/29/2012 1400   ALKPHOS 124 (H) 12/29/2012 1400   BILITOT 0.5 12/29/2012 1400   GFRNONAA >60 01/28/2017 1609   GFRAA >60 01/28/2017 1609     Diabetic Labs  (most recent): Lab Results  Component Value Date   HGBA1C 10.2 01/09/2017   HGBA1C 11.8 11/16/2016   HGBA1C 11.8 (H) 11/29/2014      Assessment & Plan:   1. Uncontrolled type 2 diabetes mellitus Complicated by CAD:  - Zamia Luxe Cuadros has currently uncontrolled symptomatic type 2 DM since  62 years of age,  with most recent A1c of 10.2 %. Recent labs reviewed.  -her diabetes is complicated by CAD  and Sara Stuart remains at a high risk for more acute and chronic complications which include CAD, CVA, CKD, retinopathy, and neuropathy. These are all discussed in detail with the patient.  - I have counseled her on diet management and weight loss, by adopting a carbohydrate restricted/protein rich diet.  - Suggestion is made for her to avoid simple carbohydrates  from her diet including Cakes, Sweet Desserts, Ice Cream, Soda (diet and regular), Sweet Tea, Candies, Chips, Cookies, Store Bought Juices, Alcohol in Excess of  1-2 drinks a day, Artificial Sweeteners, and "Sugar-free" Products. This will help patient to have stable blood glucose profile and potentially avoid unintended weight gain.  - I encouraged her to switch to  unprocessed or minimally processed complex starch and increased protein intake (animal or plant source), fruits, and vegetables.  - she is advised to stick to a routine mealtimes to eat 3 meals  a day and avoid unnecessary snacks ( to snack only to correct hypoglycemia).   - she will be scheduled with Norm Salt, RDN, CDE for individualized diabetes education.  - I have approached her with the following individualized plan to manage diabetes and patient agrees:   - She is being considered for elective left knee surgery.  - My recommendation is to postpone her elective surgery until after she controls her diabetes to target, EAG of <180 and /or a1c of  <8%. - She has some supplies of Humulin 70/30, and wishes to use it up before she switches to insulin  analogs. - she would benefit from insulin analogs if she has coverage. - in the meantime, she is approached  Increase Humulin 70 /30 to 50 units with breakfast and supper when  BG readings are above 90 mg/dl. She will monitor BG 4 times a day - before meals and at bed time.  - Patient is warned not to take insulin without proper monitoring per orders. -Adjustment parameters are given for hypo and hyperglycemia in writing. -Patient is encouraged to call clinic for blood glucose levels less than 70 or above 300 mg /dl. - I will continue metformin 1000mg  po BID, therapeutically suitable for patient .  - she will be considered for incretin therapy as appropriate next visit. - Patient specific target  A1c;  LDL, HDL, Triglycerides, and  Waist Circumference were discussed in detail.  2) BP/HTN: Her BP is controlled to target. She is on Triam/HCTZ, she is advised to continue.  3) Lipids/HPL:  No recent fasting lipid panel to review. She is not on statins. She will be considered for fasting lipid panel onsubsequent visits.  4)  Weight/Diet: CDE Consult has been initiated , exercise, and detailed carbohydrates information provided.  5) Chronic Care/Health Maintenance:  -she  Is not  on ACEI/ARB and Statin medications and  is encouraged to continue to follow up with Ophthalmology, Dentist,  Podiatrist at least yearly or according to recommendations, and advised to  stay away from smoking. I have recommended yearly flu vaccine and pneumonia vaccination at least every 5 years; moderate intensity exercise for up to 150 minutes weekly; and  sleep for at least 7 hours a day. She is advised to delay her elective knee surgery.  - I advised patient to maintain close follow up with Louie Bostonapper, David B., MD for primary care needs.  - Time spent with the patient: 45 minutes, of which >50% was spent in obtaining information about her symptoms, reviewing her previous labs, evaluations, and treatments, counseling her  about her  Currently uncontrolled, complicated type 2 DM, HTN , and developing a plan to confirm the diagnosis and long term treatment as necessary.  Sara BolognaGracie [name] Keeler participated in the discussions, expressed understanding, and voiced agreement with the above plans.  All questions were answered to her satisfaction. she is encouraged to contact clinic should she have any questions or concerns prior to her return visit.  Follow up plan: - Return in about 1 week (around 03/21/2017) for follow up with meter and logs- no labs.  Marquis LunchGebre Taegan Haider, MD [facility] [address] [place], [place] [address] Phone: [phone]  Fax: [phone]    03/14/2017, 8:14 PM  This note was partially dictated with voice recognition software. Similar sounding words can be transcribed inadequately or may not  be corrected upon review.

## 2017-03-20 ENCOUNTER — Ambulatory Visit: Payer: Commercial Managed Care - PPO | Admitting: Nutrition

## 2017-03-21 ENCOUNTER — Ambulatory Visit: Payer: Commercial Managed Care - PPO | Admitting: "Endocrinology

## 2017-03-21 ENCOUNTER — Encounter: Payer: Self-pay | Admitting: "Endocrinology

## 2017-03-21 VITALS — BP 128/82 | HR 74 | Ht 60.0 in | Wt 209.0 lb

## 2017-03-21 DIAGNOSIS — I10 Essential (primary) hypertension: Secondary | ICD-10-CM

## 2017-03-21 DIAGNOSIS — E1159 Type 2 diabetes mellitus with other circulatory complications: Secondary | ICD-10-CM | POA: Diagnosis not present

## 2017-03-21 DIAGNOSIS — E119 Type 2 diabetes mellitus without complications: Secondary | ICD-10-CM | POA: Insufficient documentation

## 2017-03-21 MED ORDER — GLUCOSE BLOOD VI STRP: 1.0000 | ORAL_STRIP | Freq: Four times a day (QID) | 5 refills | Status: DC

## 2017-03-21 MED ORDER — INSULIN PEN NEEDLE 31G X 8 MM MISC: 1.0000 | 3 refills | Status: AC

## 2017-03-21 MED ORDER — INSULIN LISPRO 100 UNIT/ML (KWIKPEN): 10.0000 [IU] | PEN_INJECTOR | Freq: Three times a day (TID) | SUBCUTANEOUS | 2 refills | Status: DC

## 2017-03-21 MED ORDER — INSULIN DEGLUDEC 100 UNIT/ML ~~LOC~~ SOPN: 30.0000 [IU] | PEN_INJECTOR | Freq: Every day | SUBCUTANEOUS | 2 refills | Status: DC

## 2017-03-21 NOTE — Patient Instructions (Signed)

## 2017-03-21 NOTE — Progress Notes (Signed)
Endocrinology follow-up  Note       03/21/2017, 6:03 PM   Subjective:    Patient ID: Sara Stuart, female    DOB: 1955/06/13.  Sara Stuart is being seen in follow-up for management of currently uncontrolled symptomatic diabetes requested by  Louie Boston., MD.   Past Medical History:  Diagnosis Date  . Arthritis   . Chronic back pain    spondylolishthesis  . DDD (degenerative disc disease), lumbosacral   . Diabetes mellitus without complication (HCC) diagnosed in 02/2014   takes Metformin and Glipizide daily  . GERD (gastroesophageal reflux disease)    takes Omeprazole daily  . Gout    takes Colchicine daily as needed  . Hypertension    takes Dyazide and Diltiazem daily  . Nocturia   . Polio   . PONV (postoperative nausea and vomiting)   . Wears glasses    Past Surgical History:  Procedure Laterality Date  . ABDOMINAL HYSTERECTOMY     TUBAL PREGNANCY  . BACK SURGERY  07/2012  . bladder tacked     . COLONOSCOPY    . FOOT SURGERY     more than 5 surgeries on right foot related to polio  . surgery for polio     states x 5   . TUBAL LIGATION     Social History   Socioeconomic History  . Marital status: Married    Spouse name: Not on file  . Number of children: Not on file  . Years of education: Not on file  . Highest education level: Not on file  Occupational History  . Not on file  Social Needs  . Financial resource strain: Not on file  . Food insecurity:    Worry: Not on file    Inability: Not on file  . Transportation needs:    Medical: Not on file    Non-medical: Not on file  Tobacco Use  . Smoking status: Never Smoker  . Smokeless tobacco: Never Used  Substance and Sexual Activity  . Alcohol use: No  . Drug use: No  . Sexual activity: Yes    Birth control/protection: Surgical  Lifestyle  . Physical activity:    Days per week: Not on file    Minutes per  session: Not on file  . Stress: Not on file  Relationships  . Social connections:    Talks on phone: Not on file    Gets together: Not on file    Attends religious service: Not on file    Active member of club or organization: Not on file    Attends meetings of clubs or organizations: Not on file    Relationship status: Not on file  Other Topics Concern  . Not on file  Social History Narrative  . Not on file   Outpatient Encounter Medications as of 03/21/2017  Medication Sig  . aspirin EC 81 MG tablet Take 1 tablet (81 mg total) by mouth daily.  . colchicine 0.6 MG tablet Take 0.6 mg by mouth daily as needed (for gout pain).   Marland Kitchen diltiazem (DILACOR XR) 120 MG 24 hr capsule Take  120 mg by mouth daily.  Marland Kitchen. glucose blood test strip 1 each by Other route 4 (four) times daily. Use as instructed qid E11.65 Relion glucometer  . ibuprofen (ADVIL,MOTRIN) 800 MG tablet Take 800 mg by mouth every 8 (eight) hours as needed for moderate pain.  Marland Kitchen. insulin degludec (TRESIBA FLEXTOUCH) 100 UNIT/ML SOPN FlexTouch Pen Inject 0.3 mLs (30 Units total) into the skin daily at 10 pm.  . insulin lispro (HUMALOG KWIKPEN) 100 UNIT/ML KiwkPen Inject 0.1-0.16 mLs (10-16 Units total) into the skin 3 (three) times daily before meals.  . Insulin Pen Needle (B-D ULTRAFINE III SHORT PEN) 31G X 8 MM MISC 1 each by Does not apply route as directed.  . loperamide (IMODIUM) 2 MG capsule Take 2 mg by mouth as needed for diarrhea or loose stools.  . metFORMIN (GLUCOPHAGE) 500 MG tablet Take 1,000 mg by mouth 2 (two) times daily with a meal.   . omeprazole (PRILOSEC) 40 MG capsule Take 40 mg by mouth daily.  . potassium chloride (K-DUR) 10 MEQ tablet Take 10 mEq by mouth daily.  Marland Kitchen. triamterene-hydrochlorothiazide (DYAZIDE) 37.5-25 MG per capsule Take 0.5 capsules by mouth daily.   . [DISCONTINUED] glucose blood test strip 1 each by Other route 4 (four) times daily. Use as instructed qid E11.65 Relion glucometer  . [DISCONTINUED]  insulin NPH-regular Human (HUMULIN 70/30) (70-30) 100 UNIT/ML injection Inject 40 Units into the skin 2 (two) times daily before a meal.    No facility-administered encounter medications on file as of 03/21/2017.     ALLERGIES: No Known Allergies  VACCINATION STATUS: Immunization History  Administered Date(s) Administered  . Influenza,trivalent, recombinat, inj, PF 10/02/2014    Diabetes  She presents for her follow-up diabetic visit. She has type 2 diabetes mellitus. Onset time: she was diagnosed at approx age of 62 years. Her disease course has been worsening. There are no hypoglycemic associated symptoms. Pertinent negatives for hypoglycemia include no confusion, headaches, pallor or seizures. Associated symptoms include blurred vision, fatigue, polydipsia and polyuria. Pertinent negatives for diabetes include no chest pain and no polyphagia. There are no hypoglycemic complications. Symptoms are worsening. Diabetic complications include heart disease. Risk factors for coronary artery disease include diabetes mellitus, obesity, sedentary lifestyle and post-menopausal. Current diabetic treatment includes insulin injections and oral agent (monotherapy). Her weight is stable. She is following a generally unhealthy diet. When asked about meal planning, she reported none. She has not had a previous visit with a dietitian. She never participates in exercise. Her home blood glucose trend is fluctuating minimally. Her breakfast blood glucose range is generally >200 mg/dl. Her lunch blood glucose range is generally 140-180 mg/dl. Her dinner blood glucose range is generally >200 mg/dl. Her bedtime blood glucose range is generally >200 mg/dl. Her overall blood glucose range is >200 mg/dl. (She brought a log showing significantly fluctuating blood glucose profile including some mild hypoglycemia overnight.   Her recent a1c was 10.2%. Prior a1c s 10.2% and 11.8%. ) An ACE inhibitor/angiotensin II receptor  blocker is not being taken. She does not see a podiatrist.Eye exam is not current.  Hypertension  This is a chronic problem. The current episode started more than 1 year ago. Associated symptoms include blurred vision. Pertinent negatives include no chest pain, headaches, palpitations or shortness of breath. Risk factors for coronary artery disease include diabetes mellitus, obesity, sedentary lifestyle and family history. Past treatments include diuretics. Hypertensive end-organ damage includes CAD/MI.     Review of Systems  Constitutional: Positive for  fatigue. Negative for chills, fever and unexpected weight change.  HENT: Negative for trouble swallowing and voice change.   Eyes: Positive for blurred vision. Negative for visual disturbance.  Respiratory: Negative for cough, shortness of breath and wheezing.   Cardiovascular: Negative for chest pain, palpitations and leg swelling.  Gastrointestinal: Negative for diarrhea, nausea and vomiting.  Endocrine: Positive for polydipsia and polyuria. Negative for cold intolerance, heat intolerance and polyphagia.  Musculoskeletal: Positive for gait problem. Negative for arthralgias and myalgias.       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 Uses a cane to ambulate.  Skin: Negative for color change, pallor, rash and wound.  Neurological: Negative for seizures and headaches.  Psychiatric/Behavioral: Negative for confusion and suicidal ideas.    Objective:    BP 128/82   Pulse 74   Ht 5' (1.524 m)   Wt 209 lb (94.8 kg)   BMI 40.82 kg/m   Wt Readings from Last 3 Encounters:  03/21/17 209 lb (94.8 kg)  03/14/17 207 lb (93.9 kg)  01/28/17 213 lb 14.4 oz (97 kg)     Physical Exam  Constitutional: She is oriented to person, place, and time. She appears well-developed.  Uses  A cane to walk  HENT:  Head: Normocephalic and atraumatic.  Eyes: EOM are normal.  Neck: Normal range of motion. Neck supple. No tracheal deviation present. No  thyromegaly present.  Cardiovascular: Normal rate and regular rhythm.  Pulmonary/Chest: Effort normal and breath sounds normal.  Abdominal: Soft. Bowel sounds are normal. There is no tenderness. There is no guarding.  Musculoskeletal: Normal range of motion. She exhibits deformity. She exhibits no edema.  Polio effect on Rt LE  Neurological: She is alert and oriented to person, place, and time. She has normal reflexes. No cranial nerve deficit. Coordination normal.  Skin: Skin is warm and dry. No rash noted. No erythema. No pallor.  Psychiatric: She has a normal mood and affect. Judgment normal.     CMP ( most recent) CMP     Component Value Date/Time   NA 136 01/28/2017 1609   K 3.6 01/28/2017 1609   CL 100 (L) 01/28/2017 1609   CO2 25 01/28/2017 1609   GLUCOSE 323 (H) 01/28/2017 1609   BUN 12 01/28/2017 1609   CREATININE 0.78 01/28/2017 1609   CALCIUM 9.1 01/28/2017 1609   PROT 7.9 12/29/2012 1400   ALBUMIN 3.9 12/29/2012 1400   AST 45 (H) 12/29/2012 1400   ALT 34 12/29/2012 1400   ALKPHOS 124 (H) 12/29/2012 1400   BILITOT 0.5 12/29/2012 1400   GFRNONAA >60 01/28/2017 1609   GFRAA >60 01/28/2017 1609     Diabetic Labs (most recent): Lab Results  Component Value Date   HGBA1C 10.2 01/09/2017   HGBA1C 11.8 11/16/2016   HGBA1C 11.8 (H) 11/29/2014      Assessment & Plan:   1. Uncontrolled type 2 diabetes mellitus Complicated by CAD:  - Sara Stuart has currently uncontrolled symptomatic type 2 DM since  63 years of age. -She came with significantly fluctuating blood glucose profile including some mild hypoglycemic episodes on premixed insulin. -Her recent A1c was high at 10. 2 percent.  - Recent labs reviewed.  -her diabetes is complicated by CAD  and Sara Stuart remains at a high risk for more acute and chronic complications which include CAD, CVA, CKD, retinopathy, and neuropathy. These are all discussed in detail with the patient.  - I have  counseled her on  diet management and weight loss, by adopting a carbohydrate restricted/protein rich diet.  -  Suggestion is made for her to avoid simple carbohydrates  from her diet including Cakes, Sweet Desserts / Pastries, Ice Cream, Soda (diet and regular), Sweet Tea, Candies, Chips, Cookies, Store Bought Juices, Alcohol in Excess of  1-2 drinks a day, Artificial Sweeteners, and "Sugar-free" Products. This will help patient to have stable blood glucose profile and potentially avoid unintended weight gain.   - I encouraged her to switch to  unprocessed or minimally processed complex starch and increased protein intake (animal or plant source), fruits, and vegetables.  - she is advised to stick to a routine mealtimes to eat 3 meals  a day and avoid unnecessary snacks ( to snack only to correct hypoglycemia).   - she will be scheduled with Norm Salt, RDN, CDE for individualized diabetes education.  - I have approached her with the following individualized plan to manage diabetes and patient agrees:   -Given her current glycemic burden she would require intensive treatment with basal/bolus insulin.   She is being considered for elective left knee surgery.  - My recommendation is to postpone her elective surgery until after she controls her diabetes to target, EAG of <180 and /or a1c of  <8%. -She did not tolerate higher dose of Humulin 70/30 causing significant fluctuation in her blood glucose profile. -She will be switched to Tresiba 30 units nightly, Humalog 10 units 3 times daily before meals for pre-meal blood glucose of 90 mg/dL or above associated with strict monitoring of blood glucose 4 times a day-before meals and at bedtime.  Samples of these regiments were provided from clinic. I advised her to discontinue Humulin 70/30.    She will monitor BG 4 times a day - before meals and at bed time.  - Patient is warned not to take insulin without proper monitoring per  orders. -Adjustment parameters are given for hypo and hyperglycemia in writing. -Patient is encouraged to call clinic for blood glucose levels less than 70 or above 300 mg /dl. - I will continue metformin 1000mg  p.o. twice daily with meals, therapeutically suitable for patient .  - she will be considered for incretin therapy as appropriate next visit. - Patient specific target  A1c;  LDL, HDL, Triglycerides, and  Waist Circumference were discussed in detail.  2) BP/HTN: Her blood pressure is controlled to target.  She is on Triam/HCTZ, she is advised to continue.  3) Lipids/HPL:  No recent fasting lipid panel to review. She is not on statins. She will be considered for fasting lipid panel onsubsequent visits.  4)  Weight/Diet: CDE Consult has been initiated , exercise, and detailed carbohydrates information provided.  5) Chronic Care/Health Maintenance:  -she  Is not  on ACEI/ARB and Statin medications and  is encouraged to continue to follow up with Ophthalmology, Dentist,  Podiatrist at least yearly or according to recommendations, and advised to  stay away from smoking. I have recommended yearly flu vaccine and pneumonia vaccination at least every 5 years; moderate intensity exercise for up to 150 minutes weekly; and  sleep for at least 7 hours a day. She is advised to delay her elective knee surgery.  - I advised patient to maintain close follow up with Louie Boston., MD for primary care needs.  - Time spent with the patient: 25 min, of which >50% was spent in reviewing her blood glucose logs , discussing her hypo- and hyper-glycemic episodes, reviewing her current  and  previous labs and insulin doses and developing a plan to avoid hypo- and hyper-glycemia. Please refer to Patient Instructions for Blood Glucose Monitoring and Insulin/Medications Dosing Guide"  in media tab for additional information. Sara Stuart participated in the discussions, expressed understanding, and voiced  agreement with the above plans.  All questions were answered to her satisfaction. she is encouraged to contact clinic should she have any questions or concerns prior to her return visit.   Follow up plan: - Return in about 5 years (around 03/22/2022) for meter, and logs.  Marquis Lunch, MD [facility] [address] [place], [place] [address] Phone: [phone]  Fax: [phone]    03/21/2017, 6:03 PM  This note was partially dictated with voice recognition software. Similar sounding words can be transcribed inadequately or may not  be corrected upon review.

## 2017-03-22 ENCOUNTER — Other Ambulatory Visit: Payer: Self-pay

## 2017-03-22 ENCOUNTER — Telehealth: Payer: Self-pay | Admitting: "Endocrinology

## 2017-03-22 MED ORDER — INSULIN GLARGINE 100 UNIT/ML SOLOSTAR PEN: 30.0000 [IU] | PEN_INJECTOR | Freq: Every day | SUBCUTANEOUS | 2 refills | Status: DC

## 2017-03-22 MED ORDER — INSULIN DETEMIR 100 UNIT/ML FLEXPEN: 30.0000 [IU] | PEN_INJECTOR | Freq: Every day | SUBCUTANEOUS | 2 refills | Status: DC

## 2017-03-22 MED ORDER — BLOOD GLUCOSE MONITOR KIT: PACK | 2 refills | Status: DC

## 2017-03-22 NOTE — Telephone Encounter (Signed)
Thank you :)

## 2017-03-22 NOTE — Telephone Encounter (Signed)
Sara Stuart is calling stating that the new insulin that Dr. Fransico HimNida put her on her insurance does not pay for it, she states that her insurance will no longer take care of her current test strips so she needs new meter & test strips

## 2017-03-22 NOTE — Telephone Encounter (Signed)
Called pt. Left VM. Insulin and new meter sent to pharmacy

## 2017-04-01 ENCOUNTER — Encounter: Payer: Commercial Managed Care - PPO | Attending: Family Medicine | Admitting: Nutrition

## 2017-04-01 VITALS — Ht 60.0 in | Wt 209.6 lb

## 2017-04-01 DIAGNOSIS — E669 Obesity, unspecified: Secondary | ICD-10-CM

## 2017-04-01 DIAGNOSIS — E118 Type 2 diabetes mellitus with unspecified complications: Secondary | ICD-10-CM

## 2017-04-01 DIAGNOSIS — E1165 Type 2 diabetes mellitus with hyperglycemia: Secondary | ICD-10-CM

## 2017-04-01 DIAGNOSIS — Z713 Dietary counseling and surveillance: Secondary | ICD-10-CM | POA: Insufficient documentation

## 2017-04-01 DIAGNOSIS — IMO0002 Reserved for concepts with insufficient information to code with codable children: Secondary | ICD-10-CM

## 2017-04-01 NOTE — Patient Instructions (Addendum)
Goals 1. Follow My Plate 2. Eat 2-3 carb choices per meal 3. Increase vegetables with lunch and dinner 4. Keep drinking water  Prevent low blood sugars. Get A1C A1C 7%

## 2017-04-01 NOTE — Progress Notes (Signed)
Diabetes Self-Management Education  Visit Type: First/Initial  Appt. Start Time: 1000Appt. End Time: 1130 04/10/2017  [name], identified by name and date of birth, is a 62 y.o. female with a diagnosis of Diabetes: Type 2. PMH Polio and 2 back surgeries and needs knee surgery.   ASSESSMENT  Height 5' (1.524 m), weight 209 lb 9.6 oz (95.1 kg). Body mass index is 40.93 kg/m.  Diabetes Self-Management Education - 04/01/17 1039      Visit Information   Visit Type  First/Initial      Initial Visit   Diabetes Type  Type 2    Are you currently following a meal plan?  No    Are you taking your medications as prescribed?  Yes    Date Diagnosed  2015      Health Coping   How would you rate your overall health?  Fair      Psychosocial Assessment   Patient Belief/Attitude about Diabetes  Motivated to manage diabetes    Self-care barriers  Unsteady gait/risk for falls    Self-management support  Family;Doctor's office    Patient Concerns  Nutrition/Meal planning;Medication;Monitoring;Weight Control;Healthy Lifestyle    Special Needs  Simplified materials    Preferred Learning Style  Hands on;Auditory    Learning Readiness  Ready    How often do you need to have someone help you when you read instructions, pamphlets, or other written materials from your doctor or pharmacy?  4 - Often    What is the last grade level you completed in school?  9      Pre-Education Assessment   Patient understands the diabetes disease and treatment process.  Needs Instruction    Patient understands incorporating nutritional management into lifestyle.  Needs Instruction    Patient undertands incorporating physical activity into lifestyle.  Needs Instruction    Patient understands using medications safely.  Needs Instruction    Patient understands monitoring blood glucose, interpreting and using results  Needs Instruction    Patient understands prevention, detection, and treatment of acute  complications.  Needs Instruction    Patient understands prevention, detection, and treatment of chronic complications.  Needs Instruction    Patient understands how to develop strategies to address psychosocial issues.  Needs Instruction    Patient understands how to develop strategies to promote health/change behavior.  Needs Instruction      Complications   Last HgB A1C per patient/outside source  10.6 %    How often do you check your blood sugar?  3-4 times/day    Fasting Blood glucose range (mg/dL)  [phone]    Postprandial Blood glucose range (mg/dL)  >578>200    Number of hypoglycemic episodes per month  2    Can you tell when your blood sugar is low?  Yes    What do you do if your blood sugar is low?  GLucose tablets    Number of hyperglycemic episodes per week  15    Can you tell when your blood sugar is high?  No    What do you do if your blood sugar is high?  nothing    Have you had a dilated eye exam in the past 12 months?  No    Have you had a dental exam in the past 12 months?  No    Are you checking your feet?  Yes    How many days per week are you checking your feet?  7      Dietary  Intake   Breakfast  Scr eggs, bacon, water    Lunch  Fried bologna and cheese sandwich on wheat bread and some grapes, wate    Dinner  [employer] with ham and cheese,, green beans, water    Beverage(s)  water      Exercise   Exercise Type  ADL's      Patient Education   Disease state   Definition of diabetes, type 1 and 2, and the diagnosis of diabetes;Factors that contribute to the development of diabetes    Nutrition management   Role of diet in the treatment of diabetes and the relationship between the three main macronutrients and blood glucose level    Medications  Reviewed patients medication for diabetes, action, purpose, timing of dose and side effects.;Reviewed medication adjustment guidelines for hyperglycemia and sick days.    Monitoring  Purpose and frequency of SMBG.    Acute  complications  Taught treatment of hypoglycemia - the 15 rule.;Discussed and identified patients' treatment of hyperglycemia.    Chronic complications  Relationship between chronic complications and blood glucose control;Assessed and discussed foot care and prevention of foot problems;Retinopathy and reason for yearly dilated eye exams;Lipid levels, blood glucose control and heart disease;Dental care;Nephropathy, what it is, prevention of, the use of ACE, ARB's and early detection of through urine microalbumia.    Psychosocial adjustment  Worked with patient to identify barriers to care and solutions      Individualized Goals (developed by patient)   Nutrition  Follow meal plan discussed;General guidelines for healthy choices and portions discussed    Physical Activity  Exercise 1-2 times per week;15 minutes per day    Medications  take my medication as prescribed    Monitoring   test my blood glucose as discussed    Reducing Risk  examine blood glucose patterns      Post-Education Assessment   Patient understands the diabetes disease and treatment process.  Needs Review    Patient understands incorporating nutritional management into lifestyle.  Needs Review    Patient undertands incorporating physical activity into lifestyle.  Needs Review    Patient understands using medications safely.  Needs Review    Patient understands monitoring blood glucose, interpreting and using results  Needs Review    Patient understands prevention, detection, and treatment of acute complications.  Needs Review    Patient understands prevention, detection, and treatment of chronic complications.  Needs Review    Patient understands how to develop strategies to address psychosocial issues.  Needs Review    Patient understands how to develop strategies to promote health/change behavior.  Needs Review      Outcomes   Expected Outcomes  Demonstrated interest in learning. Expect positive outcomes    Future DMSE  4-6  wks    Program Status  Completed       Individualized Plan for Diabetes Self-Management Training:   Learning Objective:  Patient will have a greater understanding of diabetes self-management. Patient education plan is to attend individual and/or group sessions per assessed needs and concerns.   Plan:   Patient Instructions  Goals 1. Follow My Plate 2. Eat 2-3 carb choices per meal 3. Increase vegetables with lunch and dinner 4. Keep drinking water  Prevent low blood sugars. Get A1C A1C 7%   Expected Outcomes:  Demonstrated interest in learning. Expect positive outcomes  Education material provided: Living Well with Diabetes, A1C conversion sheet, My Plate and Carbohydrate counting sheet  If problems or questions, patient  to contact team via:  Phone and Email  Future DSME appointment: 4-6 wks

## 2017-04-10 ENCOUNTER — Encounter: Payer: Self-pay | Admitting: Nutrition

## 2017-05-02 ENCOUNTER — Ambulatory Visit (INDEPENDENT_AMBULATORY_CARE_PROVIDER_SITE_OTHER): Payer: Commercial Managed Care - PPO | Admitting: "Endocrinology

## 2017-05-02 ENCOUNTER — Ambulatory Visit: Payer: Commercial Managed Care - PPO | Admitting: Nutrition

## 2017-05-02 ENCOUNTER — Encounter: Payer: Self-pay | Admitting: "Endocrinology

## 2017-05-02 VITALS — BP 111/76 | HR 74 | Ht 60.0 in | Wt 205.0 lb

## 2017-05-02 DIAGNOSIS — I10 Essential (primary) hypertension: Secondary | ICD-10-CM

## 2017-05-02 DIAGNOSIS — E1159 Type 2 diabetes mellitus with other circulatory complications: Secondary | ICD-10-CM

## 2017-05-02 LAB — COMPLETE METABOLIC PANEL WITH GFR
AG Ratio: 1.6 (calc) (ref 1.0–2.5)
ALT: 16 U/L (ref 6–29)
AST: 16 U/L (ref 10–35)
Albumin: 4.1 g/dL (ref 3.6–5.1)
Alkaline phosphatase (APISO): 89 U/L (ref 33–130)
BUN: 15 mg/dL (ref 7–25)
CO2: 28 mmol/L (ref 20–32)
Calcium: 9.4 mg/dL (ref 8.6–10.4)
Chloride: 101 mmol/L (ref 98–110)
Creat: 0.81 mg/dL (ref 0.50–0.99)
GFR, Est African American: 91 mL/min/{1.73_m2} (ref >=60)
GFR, Est Non African American: 78 mL/min/{1.73_m2} (ref >=60)
Globulin: 2.6 g/dL (calc) (ref 1.9–3.7)
Glucose, Bld: 151 mg/dL — ABNORMAL HIGH (ref 65–139)
Potassium: 3.8 mmol/L (ref 3.5–5.3)
Sodium: 138 mmol/L (ref 135–146)
Total Bilirubin: 0.3 mg/dL (ref 0.2–1.2)
Total Protein: 6.7 g/dL (ref 6.1–8.1)

## 2017-05-02 LAB — LIPID PANEL
Cholesterol: 197 mg/dL (ref <200)
HDL: 52 mg/dL (ref >50)
LDL Cholesterol (Calc): 111 mg/dL (calc) — ABNORMAL HIGH
Non-HDL Cholesterol (Calc): 145 mg/dL (calc) — ABNORMAL HIGH (ref <130)
Total CHOL/HDL Ratio: 3.8 (calc) (ref <5.0)
Triglycerides: 217 mg/dL — ABNORMAL HIGH (ref <150)

## 2017-05-02 MED ORDER — INSULIN GLARGINE 100 UNIT/ML SOLOSTAR PEN: 50.0000 [IU] | PEN_INJECTOR | Freq: Every day | SUBCUTANEOUS | 2 refills | Status: DC

## 2017-05-02 NOTE — Progress Notes (Signed)
Endocrinology follow-up  Note       05/02/2017, 5:11 PM   Subjective:    Patient ID: Sara Stuart, female    DOB: Sep 24, 1955.  Sara Stuart is being seen in follow-up for management of currently uncontrolled symptomatic diabetes requested by  Deloria Lair., MD.   Past Medical History:  Diagnosis Date  . Arthritis   . Chronic back pain    spondylolishthesis  . DDD (degenerative disc disease), lumbosacral   . Diabetes mellitus without complication ([place]) diagnosed in 02/2014   takes Metformin and Glipizide daily  . GERD (gastroesophageal reflux disease)    takes Omeprazole daily  . Gout    takes Colchicine daily as needed  . Hypertension    takes Dyazide and Diltiazem daily  . Nocturia   . Polio   . PONV (postoperative nausea and vomiting)   . Wears glasses    Past Surgical History:  Procedure Laterality Date  . ABDOMINAL HYSTERECTOMY     TUBAL PREGNANCY  . BACK SURGERY  07/2012  . bladder tacked     . COLONOSCOPY    . FOOT SURGERY     more than 5 surgeries on right foot related to polio  . surgery for polio     states x 5   . TUBAL LIGATION     Social History   Socioeconomic History  . Marital status: Married    Spouse name: Not on file  . Number of children: Not on file  . Years of education: Not on file  . Highest education level: Not on file  Occupational History  . Not on file  Social Needs  . Financial resource strain: Not on file  . Food insecurity:    Worry: Not on file    Inability: Not on file  . Transportation needs:    Medical: Not on file    Non-medical: Not on file  Tobacco Use  . Smoking status: Never Smoker  . Smokeless tobacco: Never Used  Substance and Sexual Activity  . Alcohol use: No  . Drug use: No  . Sexual activity: Yes    Birth control/protection: Surgical  Lifestyle  . Physical activity:    Days per week: Not on file    Minutes per  session: Not on file  . Stress: Not on file  Relationships  . Social connections:    Talks on phone: Not on file    Gets together: Not on file    Attends religious service: Not on file    Active member of club or organization: Not on file    Attends meetings of clubs or organizations: Not on file    Relationship status: Not on file  Other Topics Concern  . Not on file  Social History Narrative  . Not on file   Outpatient Encounter Medications as of 05/02/2017  Medication Sig  . aspirin EC 81 MG tablet Take 1 tablet (81 mg total) by mouth daily.  . blood glucose meter kit and supplies KIT Dispense based on patient and insurance preference. Use up to four times daily as directed. (FOR ICD-10 E11.65)  .  colchicine 0.6 MG tablet Take 0.6 mg by mouth daily as needed (for gout pain).   Marland Kitchen diltiazem (DILACOR XR) 120 MG 24 hr capsule Take 120 mg by mouth daily.  Marland Kitchen glucose blood test strip 1 each by Other route 4 (four) times daily. Use as instructed qid E11.65 Relion glucometer  . ibuprofen (ADVIL,MOTRIN) 800 MG tablet Take 800 mg by mouth every 8 (eight) hours as needed for moderate pain.  . Insulin Glargine (LANTUS) 100 UNIT/ML Solostar Pen Inject 50 Units into the skin at bedtime.  . insulin lispro (HUMALOG KWIKPEN) 100 UNIT/ML KiwkPen Inject 0.1-0.16 mLs (10-16 Units total) into the skin 3 (three) times daily before meals.  . Insulin Pen Needle (B-D ULTRAFINE III SHORT PEN) 31G X 8 MM MISC 1 each by Does not apply route as directed.  . loperamide (IMODIUM) 2 MG capsule Take 2 mg by mouth as needed for diarrhea or loose stools.  . metFORMIN (GLUCOPHAGE) 500 MG tablet Take 1,000 mg by mouth 2 (two) times daily with a meal.   . omeprazole (PRILOSEC) 40 MG capsule Take 40 mg by mouth daily.  . potassium chloride (K-DUR) 10 MEQ tablet Take 10 mEq by mouth daily.  Marland Kitchen triamterene-hydrochlorothiazide (DYAZIDE) 37.5-25 MG per capsule Take 0.5 capsules by mouth daily.   . [DISCONTINUED] insulin degludec  (TRESIBA FLEXTOUCH) 100 UNIT/ML SOPN FlexTouch Pen Inject 0.3 mLs (30 Units total) into the skin daily at 10 pm.  . [DISCONTINUED] Insulin Detemir (LEVEMIR) 100 UNIT/ML Pen Inject 30 Units into the skin at bedtime.  . [DISCONTINUED] Insulin Glargine (LANTUS) 100 UNIT/ML Solostar Pen Inject 30 Units into the skin at bedtime.   No facility-administered encounter medications on file as of 05/02/2017.     ALLERGIES: No Known Allergies  VACCINATION STATUS: Immunization History  Administered Date(s) Administered  . Influenza,trivalent, recombinat, inj, PF 10/02/2014    Diabetes  She presents for her follow-up diabetic visit. She has type 2 diabetes mellitus. Onset time: she was diagnosed at approx age of 62 years. Her disease course has been improving. There are no hypoglycemic associated symptoms. Pertinent negatives for hypoglycemia include no confusion, headaches, pallor or seizures. Associated symptoms include polydipsia and polyuria. Pertinent negatives for diabetes include no blurred vision, no chest pain, no fatigue and no polyphagia. There are no hypoglycemic complications. Symptoms are improving. Diabetic complications include heart disease. Risk factors for coronary artery disease include diabetes mellitus, obesity, sedentary lifestyle and post-menopausal. Current diabetic treatment includes insulin injections and oral agent (monotherapy). Her weight is fluctuating minimally. She is following a generally unhealthy diet. When asked about meal planning, she reported none. She has not had a previous visit with a dietitian. She never participates in exercise. Her home blood glucose trend is fluctuating minimally. Her breakfast blood glucose range is generally >200 mg/dl. Her lunch blood glucose range is generally 180-200 mg/dl. Her dinner blood glucose range is generally 180-200 mg/dl. Her bedtime blood glucose range is generally 180-200 mg/dl. Her overall blood glucose range is >200 mg/dl. ( ) An  ACE inhibitor/angiotensin II receptor blocker is not being taken. She does not see a podiatrist.Eye exam is not current.  Hypertension  This is a chronic problem. The current episode started more than 1 year ago. Pertinent negatives include no blurred vision, chest pain, headaches, palpitations or shortness of breath. Risk factors for coronary artery disease include diabetes mellitus, obesity, sedentary lifestyle and family history. Past treatments include diuretics. Hypertensive end-organ damage includes CAD/MI.  Hyperlipidemia  This is a chronic  problem. The current episode started more than 1 year ago. The problem is uncontrolled. Exacerbating diseases include diabetes and obesity. Pertinent negatives include no chest pain, myalgias or shortness of breath. Risk factors for coronary artery disease include diabetes mellitus, dyslipidemia, hypertension, post-menopausal, obesity and a sedentary lifestyle.     Review of Systems  Constitutional: Negative for chills, fatigue, fever and unexpected weight change.  HENT: Negative for trouble swallowing and voice change.   Eyes: Negative for blurred vision and visual disturbance.  Respiratory: Negative for cough, shortness of breath and wheezing.   Cardiovascular: Negative for chest pain, palpitations and leg swelling.  Gastrointestinal: Negative for diarrhea, nausea and vomiting.  Endocrine: Positive for polydipsia and polyuria. Negative for cold intolerance, heat intolerance and polyphagia.  Musculoskeletal: Positive for gait problem. Negative for arthralgias and myalgias.       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 Uses a cane to ambulate.  Skin: Negative for color change, pallor, rash and wound.  Neurological: Negative for seizures and headaches.  Psychiatric/Behavioral: Negative for confusion and suicidal ideas.    Objective:    BP 111/76   Pulse 74   Ht 5' (1.524 m)   Wt 205 lb (93 kg)   BMI 40.04 kg/m   Wt Readings from Last 3  Encounters:  05/02/17 205 lb (93 kg)  04/01/17 209 lb 9.6 oz (95.1 kg)  03/21/17 209 lb (94.8 kg)     Physical Exam  Constitutional: She is oriented to person, place, and time. She appears well-developed.  Uses  A cane to walk  HENT:  Head: Normocephalic and atraumatic.  Eyes: EOM are normal.  Neck: Normal range of motion. Neck supple. No tracheal deviation present. No thyromegaly present.  Cardiovascular: Normal rate.  Pulmonary/Chest: Effort normal.  Abdominal: There is no tenderness. There is no guarding.  Musculoskeletal: Normal range of motion. She exhibits deformity. She exhibits no edema.  Polio effect on Rt LE  Neurological: She is alert and oriented to person, place, and time. She has normal reflexes. No cranial nerve deficit. Coordination normal.  Skin: Skin is warm and dry. No rash noted. No erythema. No pallor.  Psychiatric: She has a normal mood and affect. Judgment normal.    CMP ( most recent) CMP     Component Value Date/Time   NA 136 01/28/2017 1609   K 3.6 01/28/2017 1609   CL 100 (L) 01/28/2017 1609   CO2 25 01/28/2017 1609   GLUCOSE 323 (H) 01/28/2017 1609   BUN 12 01/28/2017 1609   CREATININE 0.78 01/28/2017 1609   CALCIUM 9.1 01/28/2017 1609   PROT 7.9 12/29/2012 1400   ALBUMIN 3.9 12/29/2012 1400   AST 45 (H) 12/29/2012 1400   ALT 34 12/29/2012 1400   ALKPHOS 124 (H) 12/29/2012 1400   BILITOT 0.5 12/29/2012 1400   GFRNONAA >60 01/28/2017 1609   GFRAA >60 01/28/2017 1609     Diabetic Labs (most recent): Lab Results  Component Value Date   HGBA1C 10.2 01/09/2017   HGBA1C 11.8 11/16/2016   HGBA1C 11.8 (H) 11/29/2014     Assessment & Plan:   1. Uncontrolled type 2 diabetes mellitus Complicated by CAD:  - Sara Stuart has currently uncontrolled symptomatic type 2 DM since  62 years of age. -She came with significantly above target  Fasting blood glucose profile I, better postprandial glucose profile. -she did not perform her previsit  labs. Her recent A1c was high at 10. 2 percent.  - Recent  labs reviewed.  -her diabetes is complicated by CAD  and Sara Stuart remains at a high risk for more acute and chronic complications which include CAD, CVA, CKD, retinopathy, and neuropathy. These are all discussed in detail with the patient.  - I have counseled her on diet management and weight loss, by adopting a carbohydrate restricted/protein rich diet.  -  Suggestion is made for her to avoid simple carbohydrates  from her diet including Cakes, Sweet Desserts / Pastries, Ice Cream, Soda (diet and regular), Sweet Tea, Candies, Chips, Cookies, Store Bought Juices, Alcohol in Excess of  1-2 drinks a day, Artificial Sweeteners, and "Sugar-free" Products. This will help patient to have stable blood glucose profile and potentially avoid unintended weight gain.  - I encouraged her to switch to  unprocessed or minimally processed complex starch and increased protein intake (animal or plant source), fruits, and vegetables.  - she is advised to stick to a routine mealtimes to eat 3 meals  a day and avoid unnecessary snacks ( to snack only to correct hypoglycemia).   - I have approached her with the following individualized plan to manage diabetes and patient agrees:     She is being considered for elective left knee surgery.  - My recommendation is for her  to postpone her elective surgery until after she controls her diabetes to target, EAG of <180 and /or a1c of  <8%. -She did not tolerate higher dose of Humulin 70/30 causing significant fluctuation in her blood glucose profile. -She is advised to increase her Lantus to 50 units nightly,  Continue Humalog 10 units 3 times daily before meals for pre-meal blood glucose of 90 mg/dL or above associated with strict monitoring of blood glucose 4 times a day-before meals and at bedtime.  Samples of these regiments were provided from clinic.  - Patient is warned not to take insulin without  proper monitoring per orders. -Adjustment parameters are given for hypo and hyperglycemia in writing. -Patient is encouraged to call clinic for blood glucose levels less than 70 or above 300 mg /dl. - I will continue metformin 1000 mg p.o. twice daily with meals, therapeutically suitable for patient .  - she will be considered for incretin therapy as appropriate next visit. - Patient specific target  A1c;  LDL, HDL, Triglycerides, and  Waist Circumference were discussed in detail.  2) BP/HTN: Her blood pressure is controlled to target.  She is on Triam/HCTZ, she is advised to continue.  3) Lipids/HPL:  No recent fasting lipid panel to review. She is not on statins. She will be have  lipid panel today and will discuss results on next visit.   4)  Weight/Diet: CDE Consult has been initiated , exercise, and detailed carbohydrates information provided.  5) Chronic Care/Health Maintenance:  -she  Is not  on ACEI/ARB and Statin medications and  is encouraged to continue to follow up with Ophthalmology, Dentist,  Podiatrist at least yearly or according to recommendations, and advised to  stay away from smoking. I have recommended yearly flu vaccine and pneumonia vaccination at least every 5 years; moderate intensity exercise for up to 150 minutes weekly; and  sleep for at least 7 hours a day. She is advised to delay her elective knee surgery.  - I advised patient to maintain close follow up with Deloria Lair., MD for primary care needs.  - Time spent with the patient: 25 min, of which >50% was spent in reviewing her blood glucose logs ,  discussing her hypo- and hyper-glycemic episodes, reviewing her current and  previous labs and insulin doses and developing a plan to avoid hypo- and hyper-glycemia. Please refer to Patient Instructions for Blood Glucose Monitoring and Insulin/Medications Dosing Guide"  in media tab for additional information. Sara Stuart participated in the discussions,  expressed understanding, and voiced agreement with the above plans.  All questions were answered to her satisfaction. she is encouraged to contact clinic should she have any questions or concerns prior to her return visit.  Follow up plan: - Return in about 10 days (around 05/12/2017) for labs today, follow up with pre-visit labs, meter, and logs.  Glade Lloyd, MD [facility] [address] [place], [place] [address] Phone: [phone]  Fax: [phone]    05/02/2017, 5:11 PM  This note was partially dictated with voice recognition software. Similar sounding words can be transcribed inadequately or may not  be corrected upon review.

## 2017-05-02 NOTE — Patient Instructions (Signed)

## 2017-05-03 LAB — HEMOGLOBIN A1C
Hgb A1c MFr Bld: 9.3 % of total Hgb — ABNORMAL HIGH (ref <5.7)
Mean Plasma Glucose: 220 (calc)
eAG (mmol/L): 12.2 (calc)

## 2017-05-03 LAB — MICROALBUMIN / CREATININE URINE RATIO
Creatinine, Urine: 107 mg/dL (ref 20–275)
Microalb Creat Ratio: 17 mcg/mg creat (ref <30)
Microalb, Ur: 1.8 mg/dL

## 2017-05-03 LAB — VITAMIN D 25 HYDROXY (VIT D DEFICIENCY, FRACTURES): Vit D, 25-Hydroxy: 22 ng/mL — ABNORMAL LOW (ref 30–100)

## 2017-05-03 LAB — T4, FREE: Free T4: 1.1 ng/dL (ref 0.8–1.8)

## 2017-05-03 LAB — TSH: TSH: 1.41 mIU/L (ref 0.40–4.50)

## 2017-05-20 ENCOUNTER — Encounter: Payer: Self-pay | Admitting: "Endocrinology

## 2017-05-20 ENCOUNTER — Ambulatory Visit (INDEPENDENT_AMBULATORY_CARE_PROVIDER_SITE_OTHER): Payer: Commercial Managed Care - PPO | Admitting: "Endocrinology

## 2017-05-20 VITALS — BP 131/81 | HR 76 | Ht 60.0 in | Wt 208.0 lb

## 2017-05-20 DIAGNOSIS — E1159 Type 2 diabetes mellitus with other circulatory complications: Secondary | ICD-10-CM | POA: Diagnosis not present

## 2017-05-20 DIAGNOSIS — I10 Essential (primary) hypertension: Secondary | ICD-10-CM | POA: Diagnosis not present

## 2017-05-20 MED ORDER — INSULIN GLARGINE 100 UNIT/ML SOLOSTAR PEN: 60.0000 [IU] | PEN_INJECTOR | Freq: Every day | SUBCUTANEOUS | 2 refills | Status: DC

## 2017-05-20 NOTE — Progress Notes (Signed)
Endocrinology follow-up  Note       05/20/2017, 3:43 PM   Subjective:    Patient ID: Sara Stuart, female    DOB: 06/20/1955.  Sara Stuart is being seen in follow-up for management of currently uncontrolled symptomatic diabetes requested by  Deloria Lair., MD.   Past Medical History:  Diagnosis Date  . Arthritis   . Chronic back pain    spondylolishthesis  . DDD (degenerative disc disease), lumbosacral   . Diabetes mellitus without complication ([place]) diagnosed in 02/2014   takes Metformin and Glipizide daily  . GERD (gastroesophageal reflux disease)    takes Omeprazole daily  . Gout    takes Colchicine daily as needed  . Hypertension    takes Dyazide and Diltiazem daily  . Nocturia   . Polio   . PONV (postoperative nausea and vomiting)   . Wears glasses    Past Surgical History:  Procedure Laterality Date  . ABDOMINAL HYSTERECTOMY     TUBAL PREGNANCY  . BACK SURGERY  07/2012  . bladder tacked     . COLONOSCOPY    . FOOT SURGERY     more than 5 surgeries on right foot related to polio  . surgery for polio     states x 5   . TUBAL LIGATION     Social History   Socioeconomic History  . Marital status: Married    Spouse name: Not on file  . Number of children: Not on file  . Years of education: Not on file  . Highest education level: Not on file  Occupational History  . Not on file  Social Needs  . Financial resource strain: Not on file  . Food insecurity:    Worry: Not on file    Inability: Not on file  . Transportation needs:    Medical: Not on file    Non-medical: Not on file  Tobacco Use  . Smoking status: Never Smoker  . Smokeless tobacco: Never Used  Substance and Sexual Activity  . Alcohol use: No  . Drug use: No  . Sexual activity: Yes    Birth control/protection: Surgical  Lifestyle  . Physical activity:    Days per week: Not on file    Minutes per  session: Not on file  . Stress: Not on file  Relationships  . Social connections:    Talks on phone: Not on file    Gets together: Not on file    Attends religious service: Not on file    Active member of club or organization: Not on file    Attends meetings of clubs or organizations: Not on file    Relationship status: Not on file  Other Topics Concern  . Not on file  Social History Narrative  . Not on file   Outpatient Encounter Medications as of 05/20/2017  Medication Sig  . aspirin EC 81 MG tablet Take 1 tablet (81 mg total) by mouth daily.  . blood glucose meter kit and supplies KIT Dispense based on patient and insurance preference. Use up to four times daily as directed. (FOR ICD-10 E11.65)  .  colchicine 0.6 MG tablet Take 0.6 mg by mouth daily as needed (for gout pain).   Marland Kitchen diltiazem (DILACOR XR) 120 MG 24 hr capsule Take 120 mg by mouth daily.  Marland Kitchen glucose blood test strip 1 each by Other route 4 (four) times daily. Use as instructed qid E11.65 Relion glucometer  . ibuprofen (ADVIL,MOTRIN) 800 MG tablet Take 800 mg by mouth every 8 (eight) hours as needed for moderate pain.  . Insulin Glargine (LANTUS) 100 UNIT/ML Solostar Pen Inject 60 Units into the skin at bedtime.  . insulin lispro (HUMALOG KWIKPEN) 100 UNIT/ML KiwkPen Inject 0.1-0.16 mLs (10-16 Units total) into the skin 3 (three) times daily before meals.  . Insulin Pen Needle (B-D ULTRAFINE III SHORT PEN) 31G X 8 MM MISC 1 each by Does not apply route as directed.  . loperamide (IMODIUM) 2 MG capsule Take 2 mg by mouth as needed for diarrhea or loose stools.  . metFORMIN (GLUCOPHAGE) 500 MG tablet Take 1,000 mg by mouth 2 (two) times daily with a meal.   . omeprazole (PRILOSEC) 40 MG capsule Take 40 mg by mouth daily.  . potassium chloride (K-DUR) 10 MEQ tablet Take 10 mEq by mouth daily.  Marland Kitchen triamterene-hydrochlorothiazide (DYAZIDE) 37.5-25 MG per capsule Take 0.5 capsules by mouth daily.   . [DISCONTINUED] Insulin  Glargine (LANTUS) 100 UNIT/ML Solostar Pen Inject 50 Units into the skin at bedtime.   No facility-administered encounter medications on file as of 05/20/2017.     ALLERGIES: No Known Allergies  VACCINATION STATUS: Immunization History  Administered Date(s) Administered  . Influenza,trivalent, recombinat, inj, PF 10/02/2014    Diabetes  She presents for her follow-up diabetic visit. She has type 2 diabetes mellitus. Onset time: she was diagnosed at approx age of 62 years. Her disease course has been improving. There are no hypoglycemic associated symptoms. Pertinent negatives for hypoglycemia include no confusion, headaches, pallor or seizures. Pertinent negatives for diabetes include no blurred vision, no chest pain, no fatigue, no polydipsia, no polyphagia and no polyuria. There are no hypoglycemic complications. Symptoms are improving. Diabetic complications include heart disease. Risk factors for coronary artery disease include diabetes mellitus, obesity, sedentary lifestyle and post-menopausal. Current diabetic treatment includes insulin injections and oral agent (monotherapy). Her weight is increasing steadily. She is following a generally unhealthy diet. When asked about meal planning, she reported none. She has not had a previous visit with a dietitian. She never participates in exercise. Her home blood glucose trend is fluctuating minimally. Her breakfast blood glucose range is generally >200 mg/dl. Her lunch blood glucose range is generally 180-200 mg/dl. Her dinner blood glucose range is generally 180-200 mg/dl. Her bedtime blood glucose range is generally 180-200 mg/dl. Her overall blood glucose range is >200 mg/dl. ( ) An ACE inhibitor/angiotensin II receptor blocker is not being taken. She does not see a podiatrist.Eye exam is not current.  Hypertension  This is a chronic problem. The current episode started more than 1 year ago. Pertinent negatives include no blurred vision, chest  pain, headaches, palpitations or shortness of breath. Risk factors for coronary artery disease include diabetes mellitus, obesity, sedentary lifestyle and family history. Past treatments include diuretics. Hypertensive end-organ damage includes CAD/MI.  Hyperlipidemia  This is a chronic problem. The current episode started more than 1 year ago. The problem is uncontrolled. Exacerbating diseases include diabetes and obesity. Pertinent negatives include no chest pain, myalgias or shortness of breath. Risk factors for coronary artery disease include diabetes mellitus, dyslipidemia, hypertension, post-menopausal, obesity  and a sedentary lifestyle.     Review of Systems  Constitutional: Negative for chills, fatigue, fever and unexpected weight change.  HENT: Negative for trouble swallowing and voice change.   Eyes: Negative for blurred vision and visual disturbance.  Respiratory: Negative for cough, shortness of breath and wheezing.   Cardiovascular: Negative for chest pain, palpitations and leg swelling.  Gastrointestinal: Negative for diarrhea, nausea and vomiting.  Endocrine: Negative for cold intolerance, heat intolerance, polydipsia, polyphagia and polyuria.  Musculoskeletal: Positive for gait problem. Negative for arthralgias and myalgias.       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 Uses a cane to ambulate.  Skin: Negative for color change, pallor, rash and wound.  Neurological: Negative for seizures and headaches.  Psychiatric/Behavioral: Negative for confusion and suicidal ideas.    Objective:    BP 131/81   Pulse 76   Ht 5' (1.524 m)   Wt 208 lb (94.3 kg)   BMI 40.62 kg/m   Wt Readings from Last 3 Encounters:  05/20/17 208 lb (94.3 kg)  05/02/17 205 lb (93 kg)  04/01/17 209 lb 9.6 oz (95.1 kg)     Physical Exam  Constitutional: She is oriented to person, place, and time. She appears well-developed.  Uses  A cane to walk  HENT:  Head: Normocephalic and atraumatic.   Eyes: EOM are normal.  Neck: Normal range of motion. Neck supple. No tracheal deviation present. No thyromegaly present.  Cardiovascular: Normal rate.  Pulmonary/Chest: Effort normal.  Abdominal: There is no tenderness. There is no guarding.  Musculoskeletal: Normal range of motion. She exhibits deformity. She exhibits no edema.  Polio effect on Rt LE  Neurological: She is alert and oriented to person, place, and time. She has normal reflexes. No cranial nerve deficit. Coordination normal.  Skin: Skin is warm and dry. No rash noted. No erythema. No pallor.  Psychiatric: She has a normal mood and affect. Judgment normal.    CMP ( most recent) CMP     Component Value Date/Time   NA 138 05/02/2017 1617   K 3.8 05/02/2017 1617   CL 101 05/02/2017 1617   CO2 28 05/02/2017 1617   GLUCOSE 151 (H) 05/02/2017 1617   BUN 15 05/02/2017 1617   CREATININE 0.81 05/02/2017 1617   CALCIUM 9.4 05/02/2017 1617   PROT 6.7 05/02/2017 1617   ALBUMIN 3.9 12/29/2012 1400   AST 16 05/02/2017 1617   ALT 16 05/02/2017 1617   ALKPHOS 124 (H) 12/29/2012 1400   BILITOT 0.3 05/02/2017 1617   GFRNONAA 78 05/02/2017 1617   GFRAA 91 05/02/2017 1617     Diabetic Labs (most recent): Lab Results  Component Value Date   HGBA1C 9.3 (H) 05/02/2017   HGBA1C 10.2 01/09/2017   HGBA1C 11.8 11/16/2016   Lipid Panel     Component Value Date/Time   CHOL 197 05/02/2017 1617   TRIG 217 (H) 05/02/2017 1617   HDL 52 05/02/2017 1617   CHOLHDL 3.8 05/02/2017 1617   LDLCALC 111 (H) 05/02/2017 1617      Assessment & Plan:   1. Uncontrolled type 2 diabetes mellitus Complicated by CAD:  - Sara Stuart has currently uncontrolled symptomatic type 2 DM since  62 years of age. -She came with improved A1c of 9.3%, slowly improving from 11.8%..  She did better on basal/bolus insulin compared to when she was on premixed insulin.   - Recent labs reviewed.  -her diabetes is complicated by CAD  and  Sara Stuart remains at a high risk for more acute and chronic complications which include CAD, CVA, CKD, retinopathy, and neuropathy. These are all discussed in detail with the patient.  - I have counseled her on diet management and weight loss, by adopting a carbohydrate restricted/protein rich diet.  -  Suggestion is made for her to avoid simple carbohydrates  from her diet including Cakes, Sweet Desserts / Pastries, Ice Cream, Soda (diet and regular), Sweet Tea, Candies, Chips, Cookies, Store Bought Juices, Alcohol in Excess of  1-2 drinks a day, Artificial Sweeteners, and "Sugar-free" Products. This will help patient to have stable blood glucose profile and potentially avoid unintended weight gain.   - I encouraged her to switch to  unprocessed or minimally processed complex starch and increased protein intake (animal or plant source), fruits, and vegetables.  - she is advised to stick to a routine mealtimes to eat 3 meals  a day and avoid unnecessary snacks ( to snack only to correct hypoglycemia).   - I have approached her with the following individualized plan to manage diabetes and patient agrees:     She is being considered for elective left knee surgery.  - My recommendation is for her  to postpone her elective surgery until after she controls her diabetes to target, EAG of <180 and /or a1c of  <8%.  -She is advised to increase her Lantus to 60 units nightly,  Continue Humalog 10 units 3 times daily before meals for pre-meal blood glucose of 90 mg/dL or above associated with strict monitoring of blood glucose 4 times a day-before meals and at bedtime.    - Patient is warned not to take insulin without proper monitoring per orders. -Adjustment parameters are given for hypo and hyperglycemia in writing. -Patient is encouraged to call clinic for blood glucose levels less than 70 or above 300 mg /dl. - I will continue metformin 1000 mg p.o. twice daily with meals, therapeutically suitable for  patient .  - she will be considered for incretin therapy as appropriate next visit. - Patient specific target  A1c;  LDL, HDL, Triglycerides, and  Waist Circumference were discussed in detail.  2) BP/HTN: Her blood pressure is controlled to target.   She is on Triam/HCTZ, she is advised to continue.  3) Lipids/HPL: Her recent lipid panel showed uncontrolled LDL at 111, she is not on statins.  She will be considered for low-dose statin therapy for next visit.   4)  Weight/Diet: CDE Consult has been initiated , exercise, and detailed carbohydrates information provided.  5) Chronic Care/Health Maintenance:  -she  Is not  on ACEI/ARB and Statin medications and  is encouraged to continue to follow up with Ophthalmology, Dentist,  Podiatrist at least yearly or according to recommendations, and advised to  stay away from smoking. I have recommended yearly flu vaccine and pneumonia vaccination at least every 5 years; moderate intensity exercise for up to 150 minutes weekly; and  sleep for at least 7 hours a day. She is advised to delay her elective knee surgery.  - I advised patient to maintain close follow up with Deloria Lair., MD for primary care needs.  - Time spent with the patient: 25 min, of which >50% was spent in reviewing her blood glucose logs , discussing her hypo- and hyper-glycemic episodes, reviewing her current and  previous labs and insulin doses and developing a plan to avoid hypo- and hyper-glycemia. Please refer to Patient Instructions for Blood Glucose  Monitoring and Insulin/Medications Dosing Guide"  in media tab for additional information. Sara Stuart participated in the discussions, expressed understanding, and voiced agreement with the above plans.  All questions were answered to her satisfaction. she is encouraged to contact clinic should she have any questions or concerns prior to her return visit.   Follow up plan: - Return in about 3 months (around 08/20/2017) for  follow up with pre-visit labs, meter, and logs.  Glade Lloyd, MD [facility] [address] [place], [place] [address] Phone: [phone]  Fax: [phone]    05/20/2017, 3:43 PM  This note was partially dictated with voice recognition software. Similar sounding words can be transcribed inadequately or may not  be corrected upon review.

## 2017-05-20 NOTE — Patient Instructions (Signed)

## 2017-07-24 ENCOUNTER — Other Ambulatory Visit: Payer: Self-pay | Admitting: "Endocrinology

## 2017-08-20 ENCOUNTER — Ambulatory Visit: Payer: Commercial Managed Care - PPO | Admitting: "Endocrinology

## 2017-08-27 ENCOUNTER — Other Ambulatory Visit: Payer: Self-pay | Admitting: "Endocrinology

## 2017-09-04 LAB — BASIC METABOLIC PANEL
BUN: 20 (ref 4–21)
Creatinine: 0.8 (ref 0.5–1.1)

## 2017-09-04 LAB — HEMOGLOBIN A1C: Hgb A1c MFr Bld: 9.2 — AB (ref 4.0–6.0)

## 2017-09-05 ENCOUNTER — Ambulatory Visit: Payer: Commercial Managed Care - PPO | Admitting: "Endocrinology

## 2017-10-10 ENCOUNTER — Encounter: Payer: Self-pay | Admitting: "Endocrinology

## 2017-10-10 ENCOUNTER — Ambulatory Visit (INDEPENDENT_AMBULATORY_CARE_PROVIDER_SITE_OTHER): Payer: Commercial Managed Care - PPO | Admitting: "Endocrinology

## 2017-10-10 VITALS — BP 132/85 | HR 81 | Ht 60.0 in | Wt 219.0 lb

## 2017-10-10 DIAGNOSIS — E1159 Type 2 diabetes mellitus with other circulatory complications: Secondary | ICD-10-CM | POA: Diagnosis not present

## 2017-10-10 DIAGNOSIS — I10 Essential (primary) hypertension: Secondary | ICD-10-CM

## 2017-10-10 DIAGNOSIS — E782 Mixed hyperlipidemia: Secondary | ICD-10-CM | POA: Diagnosis not present

## 2017-10-10 MED ORDER — INSULIN LISPRO 100 UNIT/ML (KWIKPEN): 15.0000 [IU] | PEN_INJECTOR | Freq: Three times a day (TID) | SUBCUTANEOUS | 2 refills | Status: DC

## 2017-10-10 MED ORDER — INSULIN GLARGINE 100 UNIT/ML SOLOSTAR PEN: 60.0000 [IU] | PEN_INJECTOR | Freq: Every day | SUBCUTANEOUS | 2 refills | Status: DC

## 2017-10-10 MED ORDER — ATORVASTATIN CALCIUM 20 MG PO TABS: 20.0000 mg | ORAL_TABLET | Freq: Every day | ORAL | 2 refills | Status: DC

## 2017-10-10 NOTE — Progress Notes (Signed)
Endocrinology follow-up  Note       10/10/2017, 4:53 PM   Subjective:    Patient ID: Sara Stuart, female    DOB: 02-Apr-1955.  Sara Stuart is being seen in follow-up for management of currently uncontrolled symptomatic type 2 diabetes, hypertension, hyperlipidemia. PMD:   Deloria Lair., MD.   Past Medical History:  Diagnosis Date  . Arthritis   . Chronic back pain    spondylolishthesis  . DDD (degenerative disc disease), lumbosacral   . Diabetes mellitus without complication ([facility]) diagnosed in 02/2014   takes Metformin and Glipizide daily  . GERD (gastroesophageal reflux disease)    takes Omeprazole daily  . Gout    takes Colchicine daily as needed  . Hypertension    takes Dyazide and Diltiazem daily  . Nocturia   . Polio   . PONV (postoperative nausea and vomiting)   . Wears glasses    Past Surgical History:  Procedure Laterality Date  . ABDOMINAL HYSTERECTOMY     TUBAL PREGNANCY  . BACK SURGERY  07/2012  . bladder tacked     . COLONOSCOPY    . FOOT SURGERY     more than 5 surgeries on right foot related to polio  . surgery for polio     states x 5   . TUBAL LIGATION     Social History   Socioeconomic History  . Marital status: Married    Spouse name: Not on file  . Number of children: Not on file  . Years of education: Not on file  . Highest education level: Not on file  Occupational History  . Not on file  Social Needs  . Financial resource strain: Not on file  . Food insecurity:    Worry: Not on file    Inability: Not on file  . Transportation needs:    Medical: Not on file    Non-medical: Not on file  Tobacco Use  . Smoking status: Never Smoker  . Smokeless tobacco: Never Used  Substance and Sexual Activity  . Alcohol use: No  . Drug use: No  . Sexual activity: Yes    Birth control/protection: Surgical  Lifestyle  . Physical activity:    Days per  week: Not on file    Minutes per session: Not on file  . Stress: Not on file  Relationships  . Social connections:    Talks on phone: Not on file    Gets together: Not on file    Attends religious service: Not on file    Active member of club or organization: Not on file    Attends meetings of clubs or organizations: Not on file    Relationship status: Not on file  Other Topics Concern  . Not on file  Social History Narrative  . Not on file   Outpatient Encounter Medications as of 10/10/2017  Medication Sig  . aspirin EC 81 MG tablet Take 1 tablet (81 mg total) by mouth daily.  . blood glucose meter kit and supplies KIT Dispense based on patient and insurance preference. Use up to four times daily as directed. (FOR  ICD-10 E11.65)  . colchicine 0.6 MG tablet Take 0.6 mg by mouth daily as needed (for gout pain).   Marland Kitchen diltiazem (DILACOR XR) 120 MG 24 hr capsule Take 120 mg by mouth daily.  Marland Kitchen glucose blood test strip 1 each by Other route 4 (four) times daily. Use as instructed qid E11.65 Relion glucometer  . ibuprofen (ADVIL,MOTRIN) 800 MG tablet Take 800 mg by mouth every 8 (eight) hours as needed for moderate pain.  . Insulin Glargine (LANTUS) 100 UNIT/ML Solostar Pen Inject 60 Units into the skin at bedtime.  . insulin lispro (HUMALOG KWIKPEN) 100 UNIT/ML KiwkPen Inject 0.15-0.21 mLs (15-21 Units total) into the skin 3 (three) times daily.  . Insulin Pen Needle (B-D ULTRAFINE III SHORT PEN) 31G X 8 MM MISC 1 each by Does not apply route as directed.  Marland Kitchen lisinopril (PRINIVIL,ZESTRIL) 2.5 MG tablet Take 2.5 mg by mouth daily.  Marland Kitchen loperamide (IMODIUM) 2 MG capsule Take 2 mg by mouth as needed for diarrhea or loose stools.  . metFORMIN (GLUCOPHAGE) 500 MG tablet Take 1,000 mg by mouth 2 (two) times daily with a meal.   . omeprazole (PRILOSEC) 40 MG capsule Take 40 mg by mouth daily.  . potassium chloride (K-DUR) 10 MEQ tablet Take 10 mEq by mouth daily.  Marland Kitchen triamterene-hydrochlorothiazide  (DYAZIDE) 37.5-25 MG per capsule Take 0.5 capsules by mouth daily.   . [DISCONTINUED] HUMALOG KWIKPEN 100 UNIT/ML KiwkPen INJECT 10-16 UNITS INTO THE SKIN THREE TIMES DAILY BEFORE MEALS  . [DISCONTINUED] Insulin Glargine (LANTUS) 100 UNIT/ML Solostar Pen Inject 60 Units into the skin at bedtime. (Patient taking differently: Inject 50-60 Units into the skin at bedtime. )  . [DISCONTINUED] LANTUS SOLOSTAR 100 UNIT/ML Solostar Pen INJECT 50 UNITS INTO THE SKIN AT BEDTIME   No facility-administered encounter medications on file as of 10/10/2017.     ALLERGIES: No Known Allergies  VACCINATION STATUS: Immunization History  Administered Date(s) Administered  . Influenza,trivalent, recombinat, inj, PF 10/02/2014    Diabetes  She presents for her follow-up diabetic visit. She has type 2 diabetes mellitus. Onset time: she was diagnosed at approx age of 79 years. Her disease course has been improving. There are no hypoglycemic associated symptoms. Pertinent negatives for hypoglycemia include no confusion, headaches, pallor or seizures. Pertinent negatives for diabetes include no blurred vision, no chest pain, no fatigue, no polydipsia, no polyphagia and no polyuria. There are no hypoglycemic complications. Symptoms are improving. Diabetic complications include heart disease. Risk factors for coronary artery disease include diabetes mellitus, obesity, sedentary lifestyle and post-menopausal. Current diabetic treatment includes insulin injections and oral agent (monotherapy). Her weight is increasing steadily. She is following a generally unhealthy diet. When asked about meal planning, she reported none. She has not had a previous visit with a dietitian. She never participates in exercise. Her home blood glucose trend is fluctuating minimally. Her breakfast blood glucose range is generally 180-200 mg/dl. Her lunch blood glucose range is generally >200 mg/dl. Her dinner blood glucose range is generally >200  mg/dl. Her bedtime blood glucose range is generally >200 mg/dl. Her overall blood glucose range is >200 mg/dl. An ACE inhibitor/angiotensin II receptor blocker is not being taken. She does not see a podiatrist.Eye exam is not current.  Hypertension  This is a chronic problem. The current episode started more than 1 year ago. Pertinent negatives include no blurred vision, chest pain, headaches, palpitations or shortness of breath. Risk factors for coronary artery disease include diabetes mellitus, obesity, sedentary lifestyle and family  history. Past treatments include diuretics. Hypertensive end-organ damage includes CAD/MI.  Hyperlipidemia  This is a chronic problem. The current episode started more than 1 year ago. The problem is uncontrolled. Exacerbating diseases include diabetes and obesity. Pertinent negatives include no chest pain, myalgias or shortness of breath. Risk factors for coronary artery disease include diabetes mellitus, dyslipidemia, hypertension, post-menopausal, obesity and a sedentary lifestyle.     Review of Systems  Constitutional: Negative for chills, fatigue, fever and unexpected weight change.  HENT: Negative for trouble swallowing and voice change.   Eyes: Negative for blurred vision and visual disturbance.  Respiratory: Negative for cough, shortness of breath and wheezing.   Cardiovascular: Negative for chest pain, palpitations and leg swelling.  Gastrointestinal: Negative for diarrhea, nausea and vomiting.  Endocrine: Negative for cold intolerance, heat intolerance, polydipsia, polyphagia and polyuria.  Musculoskeletal: Positive for gait problem. Negative for arthralgias and myalgias.       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 Uses a cane to ambulate.  Skin: Negative for color change, pallor, rash and wound.  Neurological: Negative for seizures and headaches.  Psychiatric/Behavioral: Negative for confusion and suicidal ideas.    Objective:    BP 132/85    Pulse 81   Ht 5' (1.524 m)   Wt 219 lb (99.3 kg)   BMI 42.77 kg/m   Wt Readings from Last 3 Encounters:  10/10/17 219 lb (99.3 kg)  05/20/17 208 lb (94.3 kg)  05/02/17 205 lb (93 kg)     Physical Exam  Constitutional: She is oriented to person, place, and time. She appears well-developed.  Uses  A cane to walk  HENT:  Head: Normocephalic and atraumatic.  Eyes: EOM are normal.  Neck: Normal range of motion. Neck supple. No tracheal deviation present. No thyromegaly present.  Cardiovascular: Normal rate.  Pulmonary/Chest: Effort normal.  Abdominal: There is no tenderness. There is no guarding.  Musculoskeletal: Normal range of motion. She exhibits deformity. She exhibits no edema.  Polio effect on Rt LE  Neurological: She is alert and oriented to person, place, and time. She has normal reflexes. No cranial nerve deficit. Coordination normal.  Skin: Skin is warm and dry. No rash noted. No erythema. No pallor.  Psychiatric: She has a normal mood and affect. Judgment normal.    CMP ( most recent) CMP     Component Value Date/Time   NA 138 05/02/2017 1617   K 3.8 05/02/2017 1617   CL 101 05/02/2017 1617   CO2 28 05/02/2017 1617   GLUCOSE 151 (H) 05/02/2017 1617   BUN 20 09/04/2017   CREATININE 0.8 09/04/2017   CREATININE 0.81 05/02/2017 1617   CALCIUM 9.4 05/02/2017 1617   PROT 6.7 05/02/2017 1617   ALBUMIN 3.9 12/29/2012 1400   AST 16 05/02/2017 1617   ALT 16 05/02/2017 1617   ALKPHOS 124 (H) 12/29/2012 1400   BILITOT 0.3 05/02/2017 1617   GFRNONAA 78 05/02/2017 1617   GFRAA 91 05/02/2017 1617     Diabetic Labs (most recent): Lab Results  Component Value Date   HGBA1C 9.2 (A) 09/04/2017   HGBA1C 9.3 (H) 05/02/2017   HGBA1C 10.2 01/09/2017   Lipid Panel     Component Value Date/Time   CHOL 197 05/02/2017 1617   TRIG 217 (H) 05/02/2017 1617   HDL 52 05/02/2017 1617   CHOLHDL 3.8 05/02/2017 1617   LDLCALC 111 (H) 05/02/2017 1617      Assessment & Plan:    1. Uncontrolled type  2 diabetes mellitus Complicated by CAD:  - Sara Stuart has currently uncontrolled symptomatic type 2 DM since  62 years of age. -She came with still significantly above target postprandial glycemic profile and A1c of 9.2%, slowly improving from 10.8%.    -her diabetes is complicated by CAD  and Sara Stuart remains at a high risk for more acute and chronic complications which include CAD, CVA, CKD, retinopathy, and neuropathy. These are all discussed in detail with the patient.  - I have counseled her on diet management and weight loss, by adopting a carbohydrate restricted/protein rich diet. -She still admits to dietary indiscretions including consumption of sweets and sweetened beverages. -  Suggestion is made for her to avoid simple carbohydrates  from her diet including Cakes, Sweet Desserts / Pastries, Ice Cream, Soda (diet and regular), Sweet Tea, Candies, Chips, Cookies, Store Bought Juices, Alcohol in Excess of  1-2 drinks a day, Artificial Sweeteners, and "Sugar-free" Products. This will help patient to have stable blood glucose profile and potentially avoid unintended weight gain.  - I encouraged her to switch to  unprocessed or minimally processed complex starch and increased protein intake (animal or plant source), fruits, and vegetables.  - she is advised to stick to a routine mealtimes to eat 3 meals  a day and avoid unnecessary snacks ( to snack only to correct hypoglycemia).   - I have approached her with the following individualized plan to manage diabetes and patient agrees:     - She is being considered for elective left knee surgery.  - My recommendation is for her  to postpone her elective surgery until after she controls her diabetes to target, EAG of <180 and /or a1c of  <8%.  -She is advised to renew Lantus at 60 units nightly, increase Humalog to 15 units 3 times daily before meals for pre-meal blood glucose of 90 mg/dL or above  associated with strict monitoring of blood glucose 4 times a day-before meals and at bedtime.    - Patient is warned not to take insulin without proper monitoring per orders. -Adjustment parameters are given for hypo and hyperglycemia in writing. -Patient is encouraged to call clinic for blood glucose levels less than 70 or above 300 mg /dl. -She is advised to continue metformin 1000 mg p.o. twice daily-daily after breakfast and after supper,  therapeutically suitable for patient .  - she will be considered for incretin therapy as appropriate next visit. - Patient specific target  A1c;  LDL, HDL, Triglycerides, and  Waist Circumference were discussed in detail.  2) BP/HTN: Her blood pressure is controlled to target.   She is on Triam/HCTZ, she is advised to continue.  3) Lipids/HPL: Her recent lipid panel showed uncontrolled LDL at 111, she is not on statins.  She will be considered for atorvastatin 20 mg p.o. nightly.     4)  Weight/Diet: CDE Consult has been initiated , exercise, and detailed carbohydrates information provided.  5) Chronic Care/Health Maintenance:  -she  Is on  statin medications and  is encouraged to continue to follow up with Ophthalmology, Dentist,  Podiatrist at least yearly or according to recommendations, and advised to  stay away from smoking. I have recommended yearly flu vaccine and pneumonia vaccination at least every 5 years; moderate intensity exercise for up to 150 minutes weekly; and  sleep for at least 7 hours a day. She is advised to delay her elective knee surgery.  - I advised patient to maintain  close follow up with Deloria Lair., MD for primary care needs.  - Time spent with the patient: 25 min, of which >50% was spent in reviewing her blood glucose logs , discussing her hypo- and hyper-glycemic episodes, reviewing her current and  previous labs and insulin doses and developing a plan to avoid hypo- and hyper-glycemia. Please refer to Patient  Instructions for Blood Glucose Monitoring and Insulin/Medications Dosing Guide"  in media tab for additional information. Sara Stuart participated in the discussions, expressed understanding, and voiced agreement with the above plans.  All questions were answered to her satisfaction. she is encouraged to contact clinic should she have any questions or concerns prior to her return visit.  Follow up plan: - Return in about 3 months (around 01/10/2018) for Follow up with Pre-visit Labs, Meter, and Logs.  Glade Lloyd, MD [facility] [address] [place], [place] [address] Phone: [phone]  Fax: [phone]    10/10/2017, 4:53 PM  This note was partially dictated with voice recognition software. Similar sounding words can be transcribed inadequately or may not  be corrected upon review.

## 2017-10-10 NOTE — Patient Instructions (Signed)

## 2017-11-11 LAB — HM HEPATITIS C SCREENING LAB: HM Hepatitis Screen: NEGATIVE

## 2017-11-15 ENCOUNTER — Other Ambulatory Visit: Payer: Self-pay | Admitting: "Endocrinology

## 2018-01-13 ENCOUNTER — Ambulatory Visit: Payer: Commercial Managed Care - PPO | Admitting: "Endocrinology

## 2018-05-23 ENCOUNTER — Other Ambulatory Visit: Payer: Self-pay | Admitting: "Endocrinology

## 2018-06-04 ENCOUNTER — Observation Stay (HOSPITAL_COMMUNITY)
Admission: EM | Admit: 2018-06-04 | Discharge: 2018-06-05 | Disposition: A | Discharge status: Home / Self Care | Payer: Commercial Managed Care - PPO | Attending: Internal Medicine | Admitting: Internal Medicine

## 2018-06-04 ENCOUNTER — Other Ambulatory Visit: Payer: Self-pay

## 2018-06-04 ENCOUNTER — Emergency Department (HOSPITAL_COMMUNITY): Payer: Commercial Managed Care - PPO

## 2018-06-04 ENCOUNTER — Encounter (HOSPITAL_COMMUNITY): Payer: Self-pay | Admitting: Emergency Medicine

## 2018-06-04 DIAGNOSIS — Z8612 Personal history of poliomyelitis: Secondary | ICD-10-CM | POA: Diagnosis not present

## 2018-06-04 DIAGNOSIS — I10 Essential (primary) hypertension: Secondary | ICD-10-CM | POA: Diagnosis not present

## 2018-06-04 DIAGNOSIS — Z20828 Contact with and (suspected) exposure to other viral communicable diseases: Secondary | ICD-10-CM | POA: Diagnosis not present

## 2018-06-04 DIAGNOSIS — R0789 Other chest pain: Secondary | ICD-10-CM | POA: Diagnosis present

## 2018-06-04 DIAGNOSIS — E1165 Type 2 diabetes mellitus with hyperglycemia: Secondary | ICD-10-CM | POA: Diagnosis not present

## 2018-06-04 DIAGNOSIS — R0602 Shortness of breath: Secondary | ICD-10-CM | POA: Insufficient documentation

## 2018-06-04 DIAGNOSIS — K219 Gastro-esophageal reflux disease without esophagitis: Secondary | ICD-10-CM | POA: Diagnosis not present

## 2018-06-04 DIAGNOSIS — R072 Precordial pain: Secondary | ICD-10-CM

## 2018-06-04 DIAGNOSIS — I200 Unstable angina: Secondary | ICD-10-CM | POA: Diagnosis not present

## 2018-06-04 DIAGNOSIS — R079 Chest pain, unspecified: Secondary | ICD-10-CM | POA: Diagnosis present

## 2018-06-04 DIAGNOSIS — R0609 Other forms of dyspnea: Secondary | ICD-10-CM

## 2018-06-04 LAB — CBC WITH DIFFERENTIAL/PLATELET
Abs Immature Granulocytes: 0.04 10*3/uL (ref 0.00–0.07)
Basophils Absolute: 0.1 10*3/uL (ref 0.0–0.1)
Basophils Relative: 0 %
Eosinophils Absolute: 0.3 10*3/uL (ref 0.0–0.5)
Eosinophils Relative: 3 %
HCT: 38.5 % (ref 36.0–46.0)
Hemoglobin: 12.4 g/dL (ref 12.0–15.0)
Immature Granulocytes: 0 %
Lymphocytes Relative: 22 %
Lymphs Abs: 2.7 10*3/uL (ref 0.7–4.0)
MCH: 26.8 pg (ref 26.0–34.0)
MCHC: 32.2 g/dL (ref 30.0–36.0)
MCV: 83.3 fL (ref 80.0–100.0)
Monocytes Absolute: 0.8 10*3/uL (ref 0.1–1.0)
Monocytes Relative: 6 %
Neutro Abs: 8.3 10*3/uL — ABNORMAL HIGH (ref 1.7–7.7)
Neutrophils Relative %: 69 %
Platelets: 312 10*3/uL (ref 150–400)
RBC: 4.62 MIL/uL (ref 3.87–5.11)
RDW: 13.6 % (ref 11.5–15.5)
WBC: 12.2 10*3/uL — ABNORMAL HIGH (ref 4.0–10.5)
nRBC: 0 % (ref 0.0–0.2)

## 2018-06-04 LAB — COMPREHENSIVE METABOLIC PANEL
ALT: 24 U/L (ref 0–44)
AST: 22 U/L (ref 15–41)
Albumin: 3.8 g/dL (ref 3.5–5.0)
Alkaline Phosphatase: 88 U/L (ref 38–126)
Anion gap: 12 (ref 5–15)
BUN: 19 mg/dL (ref 8–23)
CO2: 23 mmol/L (ref 22–32)
Calcium: 8.9 mg/dL (ref 8.9–10.3)
Chloride: 103 mmol/L (ref 98–111)
Creatinine, Ser: 0.92 mg/dL (ref 0.44–1.00)
GFR calc Af Amer: >60 mL/min (ref >60)
GFR calc non Af Amer: >60 mL/min (ref >60)
Glucose, Bld: 101 mg/dL — ABNORMAL HIGH (ref 70–99)
Potassium: 3.1 mmol/L — ABNORMAL LOW (ref 3.5–5.1)
Sodium: 138 mmol/L (ref 135–145)
Total Bilirubin: 0.4 mg/dL (ref 0.3–1.2)
Total Protein: 7.5 g/dL (ref 6.5–8.1)

## 2018-06-04 LAB — SARS CORONAVIRUS 2 BY RT PCR (HOSPITAL ORDER, PERFORMED IN ~~LOC~~ HOSPITAL LAB): SARS Coronavirus 2: NEGATIVE

## 2018-06-04 LAB — TROPONIN I: Troponin I: <0.03 ng/mL (ref <0.03)

## 2018-06-04 LAB — BRAIN NATRIURETIC PEPTIDE: B Natriuretic Peptide: 24 pg/mL (ref 0.0–100.0)

## 2018-06-04 MED ORDER — INSULIN ASPART 100 UNIT/ML ~~LOC~~ SOLN
0.0000 [IU] | Freq: Three times a day (TID) | SUBCUTANEOUS | Status: DC
  Administered 2018-06-05 (×2): 2 [IU] via SUBCUTANEOUS

## 2018-06-04 MED ORDER — LISINOPRIL 5 MG PO TABS
2.5000 mg | ORAL_TABLET | Freq: Every day | ORAL | Status: DC
  Administered 2018-06-05: 2.5 mg via ORAL
  Filled 2018-06-04: qty 1

## 2018-06-04 MED ORDER — TRAZODONE HCL 50 MG PO TABS
50.0000 mg | ORAL_TABLET | Freq: Every day | ORAL | Status: DC
  Administered 2018-06-04: 50 mg via ORAL
  Filled 2018-06-04: qty 1

## 2018-06-04 MED ORDER — DILTIAZEM HCL ER COATED BEADS 120 MG PO CP24
120.0000 mg | ORAL_CAPSULE | Freq: Every day | ORAL | Status: DC
  Administered 2018-06-05: 120 mg via ORAL
  Filled 2018-06-04 (×3): qty 1

## 2018-06-04 MED ORDER — LOPERAMIDE HCL 2 MG PO CAPS: 2.0000 mg | ORAL_CAPSULE | ORAL | Status: DC | PRN

## 2018-06-04 MED ORDER — PANTOPRAZOLE SODIUM 40 MG PO TBEC
80.0000 mg | DELAYED_RELEASE_TABLET | Freq: Every day | ORAL | Status: DC
  Administered 2018-06-05: 80 mg via ORAL
  Filled 2018-06-04 (×2): qty 2

## 2018-06-04 MED ORDER — COLCHICINE 0.6 MG PO TABS: 0.6000 mg | ORAL_TABLET | Freq: Every day | ORAL | Status: DC | PRN

## 2018-06-04 MED ORDER — INSULIN ASPART 100 UNIT/ML ~~LOC~~ SOLN: 10.0000 [IU] | Freq: Three times a day (TID) | SUBCUTANEOUS | Status: DC

## 2018-06-04 MED ORDER — INSULIN GLARGINE 100 UNIT/ML ~~LOC~~ SOLN
50.0000 [IU] | Freq: Every day | SUBCUTANEOUS | Status: DC
  Administered 2018-06-04: 50 [IU] via SUBCUTANEOUS
  Filled 2018-06-04 (×2): qty 0.5

## 2018-06-04 MED ORDER — TRIAMTERENE-HCTZ 37.5-25 MG PO TABS
1.0000 | ORAL_TABLET | Freq: Every day | ORAL | Status: DC
  Administered 2018-06-05: 1 via ORAL
  Filled 2018-06-04: qty 1

## 2018-06-04 MED ORDER — POTASSIUM CHLORIDE CRYS ER 10 MEQ PO TBCR
10.0000 meq | EXTENDED_RELEASE_TABLET | Freq: Every day | ORAL | Status: DC
  Administered 2018-06-05: 10 meq via ORAL
  Filled 2018-06-04 (×3): qty 1

## 2018-06-04 MED ORDER — ASPIRIN 81 MG PO CHEW
243.0000 mg | CHEWABLE_TABLET | Freq: Once | ORAL | Status: AC
  Administered 2018-06-04: 243 mg via ORAL
  Filled 2018-06-04: qty 3

## 2018-06-04 MED ORDER — ENOXAPARIN SODIUM 60 MG/0.6ML ~~LOC~~ SOLN
50.0000 mg | SUBCUTANEOUS | Status: DC
  Administered 2018-06-04: 50 mg via SUBCUTANEOUS
  Filled 2018-06-04: qty 0.6

## 2018-06-04 MED ORDER — FLUTICASONE PROPIONATE 50 MCG/ACT NA SUSP: 2.0000 | Freq: Every day | NASAL | Status: DC | PRN

## 2018-06-04 MED ORDER — ATORVASTATIN CALCIUM 20 MG PO TABS
20.0000 mg | ORAL_TABLET | Freq: Every day | ORAL | Status: DC
  Administered 2018-06-04 – 2018-06-05 (×2): 20 mg via ORAL
  Filled 2018-06-04 (×2): qty 1

## 2018-06-04 MED ORDER — ASPIRIN EC 81 MG PO TBEC
81.0000 mg | DELAYED_RELEASE_TABLET | Freq: Every day | ORAL | Status: DC
  Administered 2018-06-05: 81 mg via ORAL
  Filled 2018-06-04: qty 1

## 2018-06-04 NOTE — ED Provider Notes (Signed)
Emergency Department Provider Note   I have reviewed the triage vital signs and the nursing notes.   HISTORY  Chief Complaint Chest Pain   HPI Sara Stuart is a 63 y.o. female with PMH of polio, DDD, DM, HLD, elevated BMI, and HTN presents to the emergency department for evaluation of chest pressure and intermittent shortness of breath with worsening pain.  Symptoms have been ongoing intermittently for the past several days.  Patient describes an underlying, constant pressure in the center of her chest.  She has had 2 episodes where she becomes more short of breath, sweaty, and notices worsening pain both in her chest and new pain radiating into her left shoulder.  During the first episode she was working outside doing yard work.  She came inside to rest and pain symptoms improved.  She had another episode 2 days later with walking to the car.  She called her primary care doctor and was encouraged to present to the emergency department immediately for evaluation.  She does have some mild substernal chest discomfort at this time.  Denies shortness of breath.  No palpitations.  No fevers or chills.  No coughing.  She has noticed some swelling in both of her lower extremities.  She tells me that her right leg is affected by polio and is always smaller than the left but that both seem more swollen in the last several days.   Past Medical History:  Diagnosis Date  . Arthritis   . Chronic back pain    spondylolishthesis  . DDD (degenerative disc disease), lumbosacral   . Diabetes mellitus without complication ([facility]) diagnosed in 02/2014   takes Metformin and Glipizide daily  . GERD (gastroesophageal reflux disease)    takes Omeprazole daily  . Gout    takes Colchicine daily as needed  . Hypertension    takes Dyazide and Diltiazem daily  . Nocturia   . Polio   . PONV (postoperative nausea and vomiting)   . Wears glasses     Patient Active Problem List   Diagnosis Date Noted  .  Mixed hyperlipidemia 10/10/2017  . DM type 2 causing vascular disease ([facility]) 03/21/2017  . Uncontrolled type 2 diabetes mellitus with hyperglycemia ([facility]) 03/14/2017  . Essential hypertension, benign 03/14/2017  . Chest pain 12/29/2012  . GERD (gastroesophageal reflux disease) 12/29/2012  . History of gout 12/29/2012  . HTN (hypertension) 12/29/2012  . 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 Morbid obesity ([facility]) 12/29/2012  . Degenerative spondylolisthesis 07/08/2012  . 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 ABDOMINAL HYSTERECTOMY     TUBAL PREGNANCY  . BACK SURGERY  07/2012  . bladder tacked     . COLONOSCOPY    . FOOT SURGERY     more than 5 surgeries on right foot related to polio  . surgery for polio     states x 5   . TUBAL LIGATION      Allergies Patient has no known allergies.  Family History  Problem Relation Age of Onset  . Cancer Mother   . Diabetes Father   . Thyroid disease Father   . Heart attack Father     Social History Social History   Tobacco Use  . Smoking status: Never Smoker  . Smokeless tobacco: Never Used  Substance Use Topics  . Alcohol use: No  . Drug use: No    Review of Systems  Constitutional: No fever/chills Eyes: No visual changes. ENT: No sore throat. Cardiovascular: Positive chest pain. Respiratory: Positive shortness of breath. Gastrointestinal: No abdominal pain.  No nausea, no vomiting.  No diarrhea.  No constipation. Genitourinary: Negative for dysuria. Musculoskeletal: Negative for back pain. Intermittent pain radiating to the left shoulder.  Skin: Negative for rash. Positive diaphoresis.  Neurological: Negative for headaches, focal weakness or numbness.  10-point ROS otherwise negative.  ____________________________________________   PHYSICAL EXAM:  VITAL SIGNS: ED Triage Vitals  Enc Vitals Group     BP 06/04/18 1714  100/72     Pulse Rate 06/04/18 1714 96     Resp 06/04/18 1714 18     Temp 06/04/18 1714 98 F (36.7 C)     Temp Source 06/04/18 1714 Oral     SpO2 06/04/18 1714 96 %     Weight 06/04/18 1715 230 lb (104.3 kg)     Height 06/04/18 1715 5' (1.524 m)     Pain Score 06/04/18 1715 6   Constitutional: Alert and oriented. Well appearing and in no acute distress. Eyes: Conjunctivae are normal. Head: Atraumatic. Nose: No congestion/rhinnorhea. Mouth/Throat: Mucous membranes are moist.  Neck: No stridor.  Cardiovascular: Normal rate, regular rhythm. Good peripheral circulation. Grossly normal heart sounds.   Respiratory: Normal respiratory effort.  No retractions. Lungs CTAB. Gastrointestinal: Soft and nontender. No distention.  Musculoskeletal: LLE larger than RLE (baseline) but 1+ pitting edema noted bilaterally.  Neurologic:  Normal speech and language.  Skin:  Skin is warm, dry and intact. No rash noted.   ____________________________________________   LABS (all labs ordered are listed, but only abnormal results are displayed)  Labs Reviewed  COMPREHENSIVE METABOLIC PANEL - Abnormal; Notable for the following components:      Result Value   Potassium 3.1 (*)    Glucose, Bld 101 (*)    All other components within normal limits  CBC WITH DIFFERENTIAL/PLATELET - Abnormal; Notable for the following components:   WBC 12.2 (*)    Neutro Abs 8.3 (*)    All other components within normal limits  SARS CORONAVIRUS 2 (HOSPITAL ORDER, PERFORMED IN [facility])  BRAIN NATRIURETIC PEPTIDE  TROPONIN I   ____________________________________________  EKG   EKG Interpretation  Date/Time:  Wednesday June 04 2018 18:55:08 EDT Ventricular Rate:  88 PR Interval:    QRS Duration: 92 QT Interval:  384 QTC Calculation: 465 R Axis:   -29 Text Interpretation:  Sinus rhythm Borderline left axis deviation Low voltage, precordial leads Consider anterior infarct No STEMI  Confirmed by  Alona BeneLong, Sabino Denning [phone]([id number]) on 06/04/2018 6:58:31 PM       ____________________________________________  RADIOLOGY  Dg Chest Portable 1 View  Result Date: 06/04/2018 CLINICAL DATA:  Left-sided chest pain and shortness of breath on exertion. EXAM: PORTABLE CHEST 1 VIEW COMPARISON:  12/29/2012 FINDINGS: The cardiac silhouette, mediastinal and hilar contours are within normal limits. The lungs are clear. No pleural effusion. The bony thorax is intact. IMPRESSION: No acute cardiopulmonary findings. Electronically Signed   By: Rudie MeyerP.  Gallerani M.D.   On: 06/04/2018 18:03    ____________________________________________   PROCEDURES  Procedure(s) performed:   Procedures  None  ____________________________________________   INITIAL IMPRESSION / ASSESSMENT AND PLAN / ED COURSE  Pertinent labs & imaging results that were available during my care of the patient were reviewed by me and considered in my medical decision making (see chart for details).   Patient presents to the emergency department with chest pain, intermittent shortness  of breath with diaphoresis and left arm pain.  No infection type symptoms.  Low suspicion for PE with otherwise normal vital signs.  Patient continues to have some mild substernal chest discomfort on my evaluation but blood pressure on arrival is somewhat soft.  Will hold on nitro but give aspirin. HEART score 5. EKG reviewed with no acute ischemic findings but not transmitting to chart.   06:45 PM  Discussed patient's case with Hospitalist to request admission. Patient and family (if present) updated with plan. Care transferred to Hospitalist service.  I reviewed all nursing notes, vitals, pertinent old records, EKGs, labs, imaging (as available).  Patient is chest-pain free on my re-evaluation.   ____________________________________________  FINAL CLINICAL IMPRESSION(S) / ED DIAGNOSES  Final diagnoses:  Precordial chest pain    MEDICATIONS GIVEN DURING THIS  VISIT:  Medications  aspirin chewable tablet 243 mg (243 mg Oral Given 06/04/18 1802)     Note:  This document was prepared using Dragon voice recognition software and may include unintentional dictation errors.  Alona Bene, MD Emergency Medicine    Gerlad Pelzel, Arlyss Repress, MD 06/04/18 [phone]

## 2018-06-04 NOTE — H&P (Signed)
History and Physical:    Sara Stuart   XIP:[id number] DOB: 04-Mar-1955 DOA: 06/04/2018  Referring MD/provider: Dr. Laverta Baltimore PCP: Deloria Lair., MD   Patient coming from: Home  Chief Complaint: Chest pain  History of Present Illness:   Sara Stuart is an 63 y.o. female with past medical history significant for hypertension, diabetes, hyperlipidemia, morbid obesity status post polio who was in her usual state of health until 1 week ago when she noted progressive dyspnea on exertion.  Patient noted that she was getting short of breath just shopping which is unusual for her.  4 days prior to admission patient had an episode of profound shortness of breath associated with diaphoresis and chest pressure radiating down her left arm while she was weed eating.  She initially thought this was secondary to heat and hypoglycemia however her sugar checked normal and symptoms did resolve with going inside.  The day before admission patient had another episode of substernal chest pressure associated with diaphoresis and shortness of breath when shopping in [place].  Today patient states that she got profoundly short of breath just walking to her car after work.  She said that she was so tired she almost did not make it to her car.  He does admit that there was some diaphoresis and perhaps some chest pressure associated with this as well.  At present patient states that chest pressure has resolved.  She is also comfortable while she is at rest. Review of systems is negative for fevers but does admit to chills and sweats as noted above.  No new cough.  No new phlegm production.  Patient does not smoke.  No nausea or vomiting.  Patient does have chronic diarrhea for which she takes loperamide.  No hemoptysis.  No pleuritic chest pain.  Patient does feel that she has had increased lower extremity edema that she has had previously.  ED Course:  The patient was noted to have initially a low blood pressure  so was not treated with nitroglycerin and chest pain resolved spontaneously.  She was given aspirin 325 in the ED.  Her EKG and troponin were within normal limits.  She is admitted for unstable angina.  ROS:   ROS   Review of Systems: Skin: No rashes, lesions, wounds GI: No nausea, vomiting,  constipation GU: No dysuria, increased frequency CNS: No numbness, dizziness, headache Blood/lymphatics: No easy bruising, bleeding Mood/affect: No anxiety/depression    Past Medical History:   Past Medical History:  Diagnosis Date  . Arthritis   . Chronic back pain    spondylolishthesis  . DDD (degenerative disc disease), lumbosacral   . Diabetes mellitus without complication ([place]) diagnosed in 02/2014   takes Metformin and Glipizide daily  . GERD (gastroesophageal reflux disease)    takes Omeprazole daily  . Gout    takes Colchicine daily as needed  . Hypertension    takes Dyazide and Diltiazem daily  . Nocturia   . Polio   . PONV (postoperative nausea and vomiting)   . Wears glasses     Past Surgical History:   Past Surgical History:  Procedure Laterality Date  . ABDOMINAL HYSTERECTOMY     TUBAL PREGNANCY  . BACK SURGERY  07/2012  . bladder tacked     . COLONOSCOPY    . FOOT SURGERY     more than 5 surgeries on right foot related to polio  . surgery for polio     states x 5   .  TUBAL LIGATION      Social History:   Social History   Socioeconomic History  . Marital status: Married    Spouse name: Not on file  . Number of children: Not on file  . Years of education: Not on file  . Highest education level: Not on file  Occupational History  . Not on file  Social Needs  . Financial resource strain: Not on file  . Food insecurity:    Worry: Not on file    Inability: Not on file  . Transportation needs:    Medical: Not on file    Non-medical: Not on file  Tobacco Use  . Smoking status: Never Smoker  . Smokeless tobacco: Never Used  Substance and Sexual  Activity  . Alcohol use: No  . Drug use: No  . Sexual activity: Yes    Birth control/protection: Surgical  Lifestyle  . Physical activity:    Days per week: Not on file    Minutes per session: Not on file  . Stress: Not on file  Relationships  . Social connections:    Talks on phone: Not on file    Gets together: Not on file    Attends religious service: Not on file    Active member of club or organization: Not on file    Attends meetings of clubs or organizations: Not on file    Relationship status: Not on file  . Intimate partner violence:    Fear of current or ex partner: Not on file    Emotionally abused: Not on file    Physically abused: Not on file    Forced sexual activity: Not on file  Other Topics Concern  . Not on file  Social History Narrative  . Not on file    Allergies   Patient has no known allergies.  Family history:   Family History  Problem Relation Age of Onset  . Cancer Mother   . Diabetes Father   . Thyroid disease Father   . Heart attack Father     Current Medications:   Prior to Admission medications   Medication Sig Start Date End Date Taking? Authorizing Provider  aspirin EC 81 MG tablet Take 1 tablet (81 mg total) by mouth daily. 12/30/12  Yes Tat, Shanon Brow, MD  atorvastatin (LIPITOR) 20 MG tablet Take 1 tablet (20 mg total) by mouth daily. 10/10/17  Yes Nida, Marella Chimes, MD  blood glucose meter kit and supplies KIT Dispense based on patient and insurance preference. Use up to four times daily as directed. (FOR ICD-10 E11.65) 03/22/17  Yes Nida, Marella Chimes, MD  colchicine 0.6 MG tablet Take 0.6 mg by mouth daily as needed (for gout pain).    Yes [provider]  diltiazem (DILACOR XR) 120 MG 24 hr capsule Take 120 mg by mouth daily.   Yes [provider]  fluticasone (FLONASE) 50 MCG/ACT nasal spray Place 2 sprays into both nostrils daily as needed.  06/11/17  Yes [provider]  glucose blood test strip 1  each by Other route 4 (four) times daily. Use as instructed qid E11.65 Relion glucometer 03/21/17  Yes Nida, Marella Chimes, MD  ibuprofen (ADVIL,MOTRIN) 800 MG tablet Take 800 mg by mouth every 8 (eight) hours as needed for moderate pain.   Yes [provider]  Insulin Glargine (LANTUS) 100 UNIT/ML Solostar Pen Inject 60 Units into the skin at bedtime. 10/10/17  Yes Nida, Marella Chimes, MD  insulin lispro (HUMALOG KWIKPEN) 100  UNIT/ML KiwkPen Inject 0.15-0.21 mLs (15-21 Units total) into the skin 3 (three) times daily. 10/10/17  Yes Nida, Marella Chimes, MD  Insulin Pen Needle (B-D ULTRAFINE III SHORT PEN) 31G X 8 MM MISC 1 each by Does not apply route as directed. 03/21/17  Yes Nida, Marella Chimes, MD  lisinopril (PRINIVIL,ZESTRIL) 2.5 MG tablet Take 2.5 mg by mouth daily. 10/04/17  Yes [provider]  loperamide (IMODIUM) 2 MG capsule Take 2 mg by mouth as needed for diarrhea or loose stools.   Yes [provider]  metFORMIN (GLUCOPHAGE-XR) 500 MG 24 hr tablet Take 1,000 mg by mouth 2 (two) times daily. 05/30/18  Yes [provider]  omeprazole (PRILOSEC) 40 MG capsule Take 40 mg by mouth daily.   Yes [provider]  potassium chloride (K-DUR) 10 MEQ tablet Take 10 mEq by mouth daily.   Yes [provider]  traZODone (DESYREL) 50 MG tablet Take 50 mg by mouth at bedtime.  05/15/18  Yes [provider]  triamterene-hydrochlorothiazide (MAXZIDE-25) 37.5-25 MG tablet Take 1 tablet by mouth daily. 11/11/17  Yes [provider]    Physical Exam:   Vitals:   06/04/18 1714 06/04/18 1715 06/04/18 1800  BP: 100/72  125/88  Pulse: 96  88  Resp: 18  16  Temp: 98 F (36.7 C)    TempSrc: Oral    SpO2: 96%  95%  Weight:  104.3 kg   Height:  5' (1.524 m)      Physical Exam: Blood pressure 125/88, pulse 88, temperature 98 F (36.7 C), temperature source Oral, resp. rate 16, height 5' (1.524 m), weight 104.3 kg, SpO2 95 %.  Gen: Obese female looking older than stated age lying at 10 degrees in no acute distress. Eyes: Sclerae anicteric. Conjunctiva mildly injected. Neck: Supple, no jugular venous distention. Chest: Moderately good air entry bilaterally with no adventitious sounds.  CV: Distant, regular, no audible murmurs. Abdomen: NABS, soft, nondistended, nontender. No tenderness to light or deep palpation. No rebound, no guarding. Extremities: Trace edema to ankles bilaterally.   Neuro: Alert and oriented times 3; grossly nonfocal. Psych: Patient is cooperative, logical and coherent with appropriate mood and affect.  Data Review:    Labs: Basic Metabolic Panel: Recent Labs  Lab 06/04/18 1732  NA 138  K 3.1*  CL 103  CO2 23  GLUCOSE 101*  BUN 19  CREATININE 0.92  CALCIUM 8.9   Liver Function Tests: Recent Labs  Lab 06/04/18 1732  AST 22  ALT 24  ALKPHOS 88  BILITOT 0.4  PROT 7.5  ALBUMIN 3.8   No results for input(s): LIPASE, AMYLASE in the last 168 hours. No results for input(s): AMMONIA in the last 168 hours. CBC: Recent Labs  Lab 06/04/18 1732  WBC 12.2*  NEUTROABS 8.3*  HGB 12.4  HCT 38.5  MCV 83.3  PLT 312   Cardiac Enzymes: Recent Labs  Lab 06/04/18 1732  TROPONINI <0.03    BNP (last 3 results) No results for input(s): PROBNP in the last 8760 hours. CBG: No results for input(s): GLUCAP in the last 168 hours.  Urinalysis    Component Value Date/Time   COLORURINE YELLOW 12/18/2011 1120   APPEARANCEUR CLEAR 12/18/2011 1120   LABSPEC 1.015 12/18/2011 1120   PHURINE 6.0 12/18/2011 1120   GLUCOSEU NEGATIVE 12/18/2011 1120   HGBUR TRACE (A) 12/18/2011 1120   BILIRUBINUR NEGATIVE 12/18/2011 [place] 12/18/2011 1120   PROTEINUR NEGATIVE 12/18/2011 1120   UROBILINOGEN  0.2 12/18/2011 1120   NITRITE NEGATIVE 12/18/2011 1120   LEUKOCYTESUR NEGATIVE 12/18/2011 1120      Radiographic Studies: Dg Chest Portable 1 View  Result Date: 06/04/2018  CLINICAL DATA:  Left-sided chest pain and shortness of breath on exertion. EXAM: PORTABLE CHEST 1 VIEW COMPARISON:  12/29/2012 FINDINGS: The cardiac silhouette, mediastinal and hilar contours are within normal limits. The lungs are clear. No pleural effusion. The bony thorax is intact. IMPRESSION: No acute cardiopulmonary findings. Electronically Signed   By: Marijo Sanes M.D.   On: 06/04/2018 18:03    EKG:  NSR at 90, normal intervals, normal axis, no acute st tw changes.    Assessment/Plan:   Principal Problem:   Chest pain Active Problems:   GERD (gastroesophageal reflux disease)   History of post-polio syndrome   Uncontrolled type 2 diabetes mellitus with hyperglycemia (HCC)   Essential hypertension, benign  63 year old female with multiple risk factors presents with exertional chest pain associated with diaphoresis and radiation to her left arm over the past week.  [place] and EKG are reassuringly normal at present.  Patient was treated with aspirin in the ED and is presently chest pain-free.   CHEST PAIN Given somewhat lower blood pressure and that she is chest pain-free, will hold off on starting beta-blocker. Continue ASA 81 and atorvastatin mg daily. Follow troponins, continue telemetry monitoring. Stress echocardiogram requested for the morning.  LE EDEMA And feels that she has significant lower extremity edema however I was only able to find trace edema on examination.  Stress echocardiogram should be helpful in assessing whether there is a cardiac cause versus venous stasis disease.  DM2 Decrease Glargine from 60u to 50 u HS Decrease meal coverage from 15u tid to 10u tid CBG ACHS with low dose SSI Hold metformin Carb controlled diet, globin A1c ordered.  HTN Continue diltiazem, lisinopril and maxzide per home doses   HL Continue atorvastatin   GERD Continue PPI  GOUT Continue colchicine      Other information:   DVT prophylaxis: Lovenox ordered. Code  Status: Full code. Family Communication: Patient states her family knows she is in house Disposition Plan: Home Consults called: No consults called however stress test requested Admission status: Observation   The medical decision making is of moderate complexity, therefore this is a level 2 visit.  Dewaine Oats Tublu [place] [facility]  If 7PM-7AM, please contact night-coverage [email or web address] Password [facility] 06/04/2018, 6:48 PM

## 2018-06-04 NOTE — ED Triage Notes (Signed)
PT c/o left sided chest tightness with SOB on exertion and lower leg swelling intermittently x4 days with some pain down the left arm.

## 2018-06-05 ENCOUNTER — Observation Stay (HOSPITAL_BASED_OUTPATIENT_CLINIC_OR_DEPARTMENT_OTHER): Payer: Commercial Managed Care - PPO

## 2018-06-05 ENCOUNTER — Observation Stay (HOSPITAL_COMMUNITY): Payer: Commercial Managed Care - PPO

## 2018-06-05 DIAGNOSIS — R072 Precordial pain: Secondary | ICD-10-CM | POA: Diagnosis not present

## 2018-06-05 DIAGNOSIS — I200 Unstable angina: Secondary | ICD-10-CM | POA: Diagnosis not present

## 2018-06-05 LAB — NM MYOCAR MULTI W/SPECT W/WALL MOTION / EF
LV dias vol: 74 mL (ref 46–106)
LV sys vol: 25 mL
Peak HR: 100 {beats}/min
RATE: 0.53
Rest HR: 74 {beats}/min
SDS: 0
SRS: 0
SSS: 0
TID: 1.42

## 2018-06-05 LAB — CBC
HCT: 36.7 % (ref 36.0–46.0)
Hemoglobin: 11.4 g/dL — ABNORMAL LOW (ref 12.0–15.0)
MCH: 27 pg (ref 26.0–34.0)
MCHC: 31.1 g/dL (ref 30.0–36.0)
MCV: 87 fL (ref 80.0–100.0)
Platelets: 269 10*3/uL (ref 150–400)
RBC: 4.22 MIL/uL (ref 3.87–5.11)
RDW: 13.6 % (ref 11.5–15.5)
WBC: 7.8 10*3/uL (ref 4.0–10.5)
nRBC: 0 % (ref 0.0–0.2)

## 2018-06-05 LAB — HEMOGLOBIN A1C
Hgb A1c MFr Bld: 9.2 % — ABNORMAL HIGH (ref 4.8–5.6)
Mean Plasma Glucose: 217.34 mg/dL

## 2018-06-05 LAB — BASIC METABOLIC PANEL
Anion gap: 10 (ref 5–15)
BUN: 18 mg/dL (ref 8–23)
CO2: 25 mmol/L (ref 22–32)
Calcium: 8.5 mg/dL — ABNORMAL LOW (ref 8.9–10.3)
Chloride: 102 mmol/L (ref 98–111)
Creatinine, Ser: 0.71 mg/dL (ref 0.44–1.00)
GFR calc Af Amer: >60 mL/min (ref >60)
GFR calc non Af Amer: >60 mL/min (ref >60)
Glucose, Bld: 223 mg/dL — ABNORMAL HIGH (ref 70–99)
Potassium: 3.2 mmol/L — ABNORMAL LOW (ref 3.5–5.1)
Sodium: 137 mmol/L (ref 135–145)

## 2018-06-05 LAB — GLUCOSE, CAPILLARY
Glucose-Capillary: 187 mg/dL — ABNORMAL HIGH (ref 70–99)
Glucose-Capillary: 193 mg/dL — ABNORMAL HIGH (ref 70–99)

## 2018-06-05 LAB — TROPONIN I: Troponin I: <0.03 ng/mL (ref <0.03)

## 2018-06-05 MED ORDER — SODIUM CHLORIDE 0.9% FLUSH
INTRAVENOUS | Status: AC
  Administered 2018-06-05: 10 mL via INTRAVENOUS
  Filled 2018-06-05: qty 10

## 2018-06-05 MED ORDER — POTASSIUM CHLORIDE CRYS ER 20 MEQ PO TBCR
40.0000 meq | EXTENDED_RELEASE_TABLET | Freq: Once | ORAL | Status: AC
  Administered 2018-06-05: 40 meq via ORAL
  Filled 2018-06-05: qty 2

## 2018-06-05 MED ORDER — REGADENOSON 0.4 MG/5ML IV SOLN
INTRAVENOUS | Status: AC
  Administered 2018-06-05: 0.4 mg via INTRAVENOUS
  Filled 2018-06-05: qty 5

## 2018-06-05 MED ORDER — TECHNETIUM TC 99M TETROFOSMIN IV KIT
10.0000 | PACK | Freq: Once | INTRAVENOUS | Status: AC | PRN
  Administered 2018-06-05: 9.03 via INTRAVENOUS

## 2018-06-05 MED ORDER — TECHNETIUM TC 99M TETROFOSMIN IV KIT
30.0000 | PACK | Freq: Once | INTRAVENOUS | Status: AC | PRN
  Administered 2018-06-05: 28.3 via INTRAVENOUS

## 2018-06-05 NOTE — Plan of Care (Signed)

## 2018-06-05 NOTE — Discharge Summary (Signed)
Physician Discharge Summary  Sara Stuart KKX:[id number] DOB: 1955/09/17 DOA: 06/04/2018  PCP: Deloria Lair., MD  Admit date: 06/04/2018  Discharge date: 06/05/2018  Admitted From: Home  Disposition: Home  Recommendations for Outpatient Follow-up:  1. Follow up with PCP in 1-2 weeks with referral to outpatient cardiology as needed 2. Continue prior home medications  Home Health: None  Equipment/Devices: None  Discharge Condition: Stable  CODE STATUS: Full  Diet recommendation: Heart Healthy/carb modified  Brief/Interim Summary: Per HPI: Sara Stuart is an 63 y.o. female with past medical history significant for hypertension, diabetes, hyperlipidemia, morbid obesity status post polio who was in her usual state of health until 1 week ago when she noted progressive dyspnea on exertion.  Patient noted that she was getting short of breath just shopping which is unusual for her.  4 days prior to admission patient had an episode of profound shortness of breath associated with diaphoresis and chest pressure radiating down her left arm while she was weed eating.  She initially thought this was secondary to heat and hypoglycemia however her sugar checked normal and symptoms did resolve with going inside.  The day before admission patient had another episode of substernal chest pressure associated with diaphoresis and shortness of breath when shopping in [place].  Today patient states that she got profoundly short of breath just walking to her car after work.  She said that she was so tired she almost did not make it to her car.  He does admit that there was some diaphoresis and perhaps some chest pressure associated with this as well.  At present patient states that chest pressure has resolved.  She is also comfortable while she is at rest. Review of systems is negative for fevers but does admit to chills and sweats as noted above.  No new cough.  No new phlegm production.  Patient does  not smoke.  No nausea or vomiting.  Patient does have chronic diarrhea for which she takes loperamide.  No hemoptysis.  No pleuritic chest pain.  Patient does feel that she has had increased lower extremity edema that she has had previously.  Patient was admitted with chest pain that seemed fairly typical.  She fortunately did not have any signs of MI as troponins have remained negative and EKG with no other findings.  She has undergone Lexiscan stress study which has turned out low risk with no abnormal findings.  EF noted to be 66%.  Patient is otherwise stable for discharge and has no further symptomatology and will follow up with her PCP and consider outpatient cardiology follow-up as needed.  She does relate that she has been working much more with her upper extremities recently and may have some muscular tenderness that may be contributing to her symptoms as she has to reach forward frequently for her work.  She is also noted to have some mild chest wall tenderness to the left side that is greater than the right side.  Discharge Diagnoses:  Principal Problem:   Chest pain Active Problems:   GERD (gastroesophageal reflux disease)   History of post-polio syndrome   Uncontrolled type 2 diabetes mellitus with hyperglycemia (HCC)   Essential hypertension, benign  Principal discharge diagnosis: Atypical chest pain likely secondary to musculoskeletal cause.  Discharge Instructions  Discharge Instructions    Diet - low sodium heart healthy   Complete by:  As directed    Increase activity slowly   Complete by:  As directed  Allergies as of 06/05/2018   No Known Allergies     Medication List    TAKE these medications   aspirin EC 81 MG tablet Take 1 tablet (81 mg total) by mouth daily.   atorvastatin 20 MG tablet Commonly known as:  LIPITOR Take 1 tablet (20 mg total) by mouth daily.   blood glucose meter kit and supplies Kit Dispense based on patient and insurance preference.  Use up to four times daily as directed. (FOR ICD-10 E11.65)   colchicine 0.6 MG tablet Take 0.6 mg by mouth daily as needed (for gout pain).   diltiazem 120 MG 24 hr capsule Commonly known as:  DILACOR XR Take 120 mg by mouth daily.   fluticasone 50 MCG/ACT nasal spray Commonly known as:  FLONASE Place 2 sprays into both nostrils daily as needed.   glucose blood test strip 1 each by Other route 4 (four) times daily. Use as instructed qid E11.65 Relion glucometer   ibuprofen 800 MG tablet Commonly known as:  ADVIL Take 800 mg by mouth every 8 (eight) hours as needed for moderate pain.   Insulin Glargine 100 UNIT/ML Solostar Pen Commonly known as:  LANTUS Inject 60 Units into the skin at bedtime.   insulin lispro 100 UNIT/ML KiwkPen Commonly known as:  HumaLOG KwikPen Inject 0.15-0.21 mLs (15-21 Units total) into the skin 3 (three) times daily.   Insulin Pen Needle 31G X 8 MM Misc Commonly known as:  B-D ULTRAFINE III SHORT PEN 1 each by Does not apply route as directed.   lisinopril 2.5 MG tablet Commonly known as:  ZESTRIL Take 2.5 mg by mouth daily.   loperamide 2 MG capsule Commonly known as:  IMODIUM Take 2 mg by mouth as needed for diarrhea or loose stools.   metFORMIN 500 MG 24 hr tablet Commonly known as:  GLUCOPHAGE-XR Take 1,000 mg by mouth 2 (two) times daily.   omeprazole 40 MG capsule Commonly known as:  PRILOSEC Take 40 mg by mouth daily.   potassium chloride 10 MEQ tablet Commonly known as:  K-DUR Take 10 mEq by mouth daily.   traZODone 50 MG tablet Commonly known as:  DESYREL Take 50 mg by mouth at bedtime.   triamterene-hydrochlorothiazide 37.5-25 MG tablet Commonly known as:  MAXZIDE-25 Take 1 tablet by mouth daily.      Follow-up Information    Deloria Lair., MD Follow up in 1 week(s).   Specialty:  Family Medicine Contact information: [place] [address] [phone]          No Known  Allergies  Consultations:  None   Procedures/Studies: Nm Myocar Multi W/spect W/wall Motion / Ef  Result Date: 06/05/2018  There was no ST segment deviation noted during stress.  The study is normal.  This is a low risk study.  Nuclear stress EF: 66%.    Dg Chest Portable 1 View  Result Date: 06/04/2018 CLINICAL DATA:  Left-sided chest pain and shortness of breath on exertion. EXAM: PORTABLE CHEST 1 VIEW COMPARISON:  12/29/2012 FINDINGS: The cardiac silhouette, mediastinal and hilar contours are within normal limits. The lungs are clear. No pleural effusion. The bony thorax is intact. IMPRESSION: No acute cardiopulmonary findings. Electronically Signed   By: Marijo Sanes M.D.   On: 06/04/2018 18:03     Discharge Exam: Vitals:   06/04/18 2210 06/05/18 0510  BP: (!) 106/94 125/78  Pulse: 78 77  Resp: 17 16  Temp: 98.5 F (36.9 C) 97.6  F (36.4 C)  SpO2: 96% 96%   Vitals:   06/04/18 2030 06/04/18 2100 06/04/18 2210 06/05/18 0510  BP: 103/62 104/66 (!) 106/94 125/78  Pulse: 83 79 78 77  Resp: '18 17 17 16  ' Temp:   98.5 F (36.9 C) 97.6 F (36.4 C)  TempSrc:   Oral Oral  SpO2: 94% 94% 96% 96%  Weight:   105.1 kg   Height:        General: Pt is alert, awake, not in acute distress Cardiovascular: RRR, S1/S2 +, no rubs, no gallops Respiratory: CTA bilaterally, no wheezing, no rhonchi Abdominal: Soft, NT, ND, bowel sounds + Extremities: no edema, no cyanosis    The results of significant diagnostics from this hospitalization (including imaging, microbiology, ancillary and laboratory) are listed below for reference.     Microbiology: Recent Results (from the past 240 hour(s))  SARS Coronavirus 2 (CEPHEID - Performed in [place] hospital lab), Hosp Order     Status: None   Collection Time: 06/04/18  6:05 PM  Result Value Ref Range Status   SARS Coronavirus 2 NEGATIVE NEGATIVE Final    Comment: (NOTE) If result is NEGATIVE SARS-CoV-2 target nucleic acids are NOT  DETECTED. The SARS-CoV-2 RNA is generally detectable in upper and lower  respiratory specimens during the acute phase of infection. The lowest  concentration of SARS-CoV-2 viral copies this assay can detect is 250  copies / mL. A negative result does not preclude SARS-CoV-2 infection  and should not be used as the sole basis for treatment or other  patient management decisions.  A negative result may occur with  improper specimen collection / handling, submission of specimen other  than nasopharyngeal swab, presence of viral mutation(s) within the  areas targeted by this assay, and inadequate number of viral copies  (<250 copies / mL). A negative result must be combined with clinical  observations, patient history, and epidemiological information. If result is POSITIVE SARS-CoV-2 target nucleic acids are DETECTED. The SARS-CoV-2 RNA is generally detectable in upper and lower  respiratory specimens dur ing the acute phase of infection.  Positive  results are indicative of active infection with SARS-CoV-2.  Clinical  correlation with patient history and other diagnostic information is  necessary to determine patient infection status.  Positive results do  not rule out bacterial infection or co-infection with other viruses. If result is PRESUMPTIVE POSTIVE SARS-CoV-2 nucleic acids MAY BE PRESENT.   A presumptive positive result was obtained on the submitted specimen  and confirmed on repeat testing.  While 2019 novel coronavirus  (SARS-CoV-2) nucleic acids may be present in the submitted sample  additional confirmatory testing may be necessary for epidemiological  and / or clinical management purposes  to differentiate between  SARS-CoV-2 and other Sarbecovirus currently known to infect humans.  If clinically indicated additional testing with an alternate test  methodology [phone]) is advised. The SARS-CoV-2 RNA is generally  detectable in upper and lower respiratory sp ecimens during  the acute  phase of infection. The expected result is Negative. Fact Sheet for Patients:  StrictlyIdeas.no Fact Sheet for Healthcare Providers: BankingDealers.co.za This test is not yet approved or cleared by the [place] FDA and has been authorized for detection and/or diagnosis of SARS-CoV-2 by FDA under an Emergency Use Authorization (EUA).  This EUA will remain in effect (meaning this test can be used) for the duration of the COVID-19 declaration under Section 564(b)(1) of the Act, 21 U.S.C. section 360bbb-3(b)(1), unless the authorization is terminated  or revoked sooner. Performed at [facility], [address], [place], [place] [address]      Labs: BNP (last 3 results) Recent Labs    06/04/18 1724  BNP 06.3   Basic Metabolic Panel: Recent Labs  Lab 06/04/18 1732 06/05/18 0602  NA 138 137  K 3.1* 3.2*  CL 103 102  CO2 23 25  GLUCOSE 101* 223*  BUN 19 18  CREATININE 0.92 0.71  CALCIUM 8.9 8.5*   Liver Function Tests: Recent Labs  Lab 06/04/18 1732  AST 22  ALT 24  ALKPHOS 88  BILITOT 0.4  PROT 7.5  ALBUMIN 3.8   No results for input(s): LIPASE, AMYLASE in the last 168 hours. No results for input(s): AMMONIA in the last 168 hours. CBC: Recent Labs  Lab 06/04/18 1732 06/05/18 0602  WBC 12.2* 7.8  NEUTROABS 8.3*  --   HGB 12.4 11.4*  HCT 38.5 36.7  MCV 83.3 87.0  PLT 312 269   Cardiac Enzymes: Recent Labs  Lab 06/04/18 1732 06/05/18 0934  TROPONINI <0.03 <0.03   BNP: Invalid input(s): POCBNP CBG: Recent Labs  Lab 06/05/18 0738 06/05/18 1144  GLUCAP 187* 193*   D-Dimer No results for input(s): DDIMER in the last 72 hours. Hgb A1c No results for input(s): HGBA1C in the last 72 hours. Lipid Profile No results for input(s): CHOL, HDL, LDLCALC, TRIG, CHOLHDL, LDLDIRECT in the last 72 hours. Thyroid function studies No results for input(s): TSH, T4TOTAL, T3FREE, THYROIDAB in the last 72  hours.  Invalid input(s): FREET3 Anemia work up No results for input(s): VITAMINB12, FOLATE, FERRITIN, TIBC, IRON, RETICCTPCT in the last 72 hours. Urinalysis    Component Value Date/Time   COLORURINE YELLOW 12/18/2011 1120   APPEARANCEUR CLEAR 12/18/2011 1120   LABSPEC 1.015 12/18/2011 1120   PHURINE 6.0 12/18/2011 1120   GLUCOSEU NEGATIVE 12/18/2011 1120   HGBUR TRACE (A) 12/18/2011 1120   BILIRUBINUR NEGATIVE 12/18/2011 1120   KETONESUR NEGATIVE 12/18/2011 1120   PROTEINUR NEGATIVE 12/18/2011 1120   UROBILINOGEN 0.2 12/18/2011 1120   NITRITE NEGATIVE 12/18/2011 1120   LEUKOCYTESUR NEGATIVE 12/18/2011 1120   Sepsis Labs Invalid input(s): PROCALCITONIN,  WBC,  LACTICIDVEN Microbiology Recent Results (from the past 240 hour(s))  SARS Coronavirus 2 (CEPHEID - Performed in [place] hospital lab), Hosp Order     Status: None   Collection Time: 06/04/18  6:05 PM  Result Value Ref Range Status   SARS Coronavirus 2 NEGATIVE NEGATIVE Final    Comment: (NOTE) If result is NEGATIVE SARS-CoV-2 target nucleic acids are NOT DETECTED. The SARS-CoV-2 RNA is generally detectable in upper and lower  respiratory specimens during the acute phase of infection. The lowest  concentration of SARS-CoV-2 viral copies this assay can detect is 250  copies / mL. A negative result does not preclude SARS-CoV-2 infection  and should not be used as the sole basis for treatment or other  patient management decisions.  A negative result may occur with  improper specimen collection / handling, submission of specimen other  than nasopharyngeal swab, presence of viral mutation(s) within the  areas targeted by this assay, and inadequate number of viral copies  (<250 copies / mL). A negative result must be combined with clinical  observations, patient history, and epidemiological information. If result is POSITIVE SARS-CoV-2 target nucleic acids are DETECTED. The SARS-CoV-2 RNA is generally detectable in  upper and lower  respiratory specimens dur ing the acute phase of infection.  Positive  results are indicative of active  infection with SARS-CoV-2.  Clinical  correlation with patient history and other diagnostic information is  necessary to determine patient infection status.  Positive results do  not rule out bacterial infection or co-infection with other viruses. If result is PRESUMPTIVE POSTIVE SARS-CoV-2 nucleic acids MAY BE PRESENT.   A presumptive positive result was obtained on the submitted specimen  and confirmed on repeat testing.  While 2019 novel coronavirus  (SARS-CoV-2) nucleic acids may be present in the submitted sample  additional confirmatory testing may be necessary for epidemiological  and / or clinical management purposes  to differentiate between  SARS-CoV-2 and other Sarbecovirus currently known to infect humans.  If clinically indicated additional testing with an alternate test  methodology [phone]) is advised. The SARS-CoV-2 RNA is generally  detectable in upper and lower respiratory sp ecimens during the acute  phase of infection. The expected result is Negative. Fact Sheet for Patients:  StrictlyIdeas.no Fact Sheet for Healthcare Providers: BankingDealers.co.za This test is not yet approved or cleared by the [place] FDA and has been authorized for detection and/or diagnosis of SARS-CoV-2 by FDA under an Emergency Use Authorization (EUA).  This EUA will remain in effect (meaning this test can be used) for the duration of the COVID-19 declaration under Section 564(b)(1) of the Act, 21 U.S.C. section 360bbb-3(b)(1), unless the authorization is terminated or revoked sooner. Performed at [facility], [address] [place], [place] [address]      Time coordinating discharge: 35 minutes  SIGNED:   Rodena Goldmann, DO [facility] 06/05/2018, 12:06 PM  If 7PM-7AM, please contact  night-coverage [email or web address] Password [email or web address]

## 2018-06-06 LAB — HIV ANTIBODY (ROUTINE TESTING W REFLEX): HIV Screen 4th Generation wRfx: NONREACTIVE

## 2018-06-14 ENCOUNTER — Other Ambulatory Visit: Payer: Self-pay | Admitting: "Endocrinology

## 2018-08-13 ENCOUNTER — Other Ambulatory Visit: Payer: Self-pay | Admitting: Neurosurgery

## 2018-08-13 DIAGNOSIS — M4316 Spondylolisthesis, lumbar region: Secondary | ICD-10-CM

## 2018-08-21 ENCOUNTER — Other Ambulatory Visit: Payer: Self-pay | Admitting: "Endocrinology

## 2018-09-11 ENCOUNTER — Other Ambulatory Visit: Payer: Commercial Managed Care - PPO

## 2018-09-26 ENCOUNTER — Other Ambulatory Visit: Payer: Self-pay | Admitting: "Endocrinology

## 2018-09-30 ENCOUNTER — Other Ambulatory Visit: Payer: Self-pay | Admitting: "Endocrinology

## 2019-02-03 DIAGNOSIS — I2510 Atherosclerotic heart disease of native coronary artery without angina pectoris: Secondary | ICD-10-CM | POA: Insufficient documentation

## 2019-03-10 ENCOUNTER — Encounter (INDEPENDENT_AMBULATORY_CARE_PROVIDER_SITE_OTHER): Payer: Self-pay | Admitting: Gastroenterology

## 2019-04-19 ENCOUNTER — Other Ambulatory Visit: Payer: Self-pay | Admitting: "Endocrinology

## 2019-06-15 ENCOUNTER — Encounter (HOSPITAL_COMMUNITY): Payer: Self-pay

## 2019-06-15 ENCOUNTER — Emergency Department (HOSPITAL_COMMUNITY)
Admission: EM | Admit: 2019-06-15 | Discharge: 2019-06-15 | Disposition: A | Discharge status: Home / Self Care | Payer: Commercial Managed Care - PPO | Attending: Emergency Medicine | Admitting: Emergency Medicine

## 2019-06-15 ENCOUNTER — Other Ambulatory Visit: Payer: Self-pay

## 2019-06-15 ENCOUNTER — Emergency Department (HOSPITAL_COMMUNITY): Payer: Commercial Managed Care - PPO

## 2019-06-15 DIAGNOSIS — Z7982 Long term (current) use of aspirin: Secondary | ICD-10-CM | POA: Diagnosis not present

## 2019-06-15 DIAGNOSIS — R61 Generalized hyperhidrosis: Secondary | ICD-10-CM | POA: Insufficient documentation

## 2019-06-15 DIAGNOSIS — I10 Essential (primary) hypertension: Secondary | ICD-10-CM | POA: Insufficient documentation

## 2019-06-15 DIAGNOSIS — R0789 Other chest pain: Secondary | ICD-10-CM | POA: Insufficient documentation

## 2019-06-15 DIAGNOSIS — E1165 Type 2 diabetes mellitus with hyperglycemia: Secondary | ICD-10-CM | POA: Diagnosis not present

## 2019-06-15 DIAGNOSIS — Z794 Long term (current) use of insulin: Secondary | ICD-10-CM | POA: Insufficient documentation

## 2019-06-15 DIAGNOSIS — R0602 Shortness of breath: Secondary | ICD-10-CM | POA: Diagnosis not present

## 2019-06-15 LAB — CBC
HCT: 38.7 % (ref 36.0–46.0)
Hemoglobin: 12.4 g/dL (ref 12.0–15.0)
MCH: 26.8 pg (ref 26.0–34.0)
MCHC: 32 g/dL (ref 30.0–36.0)
MCV: 83.6 fL (ref 80.0–100.0)
Platelets: 269 10*3/uL (ref 150–400)
RBC: 4.63 MIL/uL (ref 3.87–5.11)
RDW: 14.1 % (ref 11.5–15.5)
WBC: 4.2 10*3/uL (ref 4.0–10.5)
nRBC: 0 % (ref 0.0–0.2)

## 2019-06-15 LAB — BASIC METABOLIC PANEL
Anion gap: 12 (ref 5–15)
BUN: 18 mg/dL (ref 8–23)
CO2: 24 mmol/L (ref 22–32)
Calcium: 8.5 mg/dL — ABNORMAL LOW (ref 8.9–10.3)
Chloride: 100 mmol/L (ref 98–111)
Creatinine, Ser: 1.06 mg/dL — ABNORMAL HIGH (ref 0.44–1.00)
GFR calc Af Amer: >60 mL/min (ref >60)
GFR calc non Af Amer: 56 mL/min — ABNORMAL LOW (ref >60)
Glucose, Bld: 297 mg/dL — ABNORMAL HIGH (ref 70–99)
Potassium: 4.4 mmol/L (ref 3.5–5.1)
Sodium: 136 mmol/L (ref 135–145)

## 2019-06-15 LAB — TROPONIN I (HIGH SENSITIVITY)
Troponin I (High Sensitivity): 3 ng/L (ref <18)
Troponin I (High Sensitivity): 3 ng/L (ref <18)

## 2019-06-15 MED ORDER — SODIUM CHLORIDE 0.9% FLUSH: 3.0000 mL | Freq: Once | INTRAVENOUS | Status: DC

## 2019-06-15 MED ORDER — NITROGLYCERIN 0.4 MG SL SUBL
0.4000 mg | SUBLINGUAL_TABLET | SUBLINGUAL | Status: DC | PRN
  Administered 2019-06-15: 0.4 mg via SUBLINGUAL
  Filled 2019-06-15 (×2): qty 1

## 2019-06-15 MED ORDER — ASPIRIN 81 MG PO CHEW
162.0000 mg | CHEWABLE_TABLET | Freq: Once | ORAL | Status: AC
  Administered 2019-06-15: 162 mg via ORAL
  Filled 2019-06-15: qty 2

## 2019-06-15 NOTE — ED Notes (Signed)
NT came to RN reporting that pt was wanting to leave. RN went in to assess pt. Pt expressed concern to leave and remove her IV. RN offered to speak with MD after removing IV. Pt agreed to wait until MD was consulted. IV removed. MD had discharge paperwork done and pt was discharged. Pt refused vitals upon discharge. Denies pain. Pt ambulatory upon discharge.

## 2019-06-15 NOTE — ED Triage Notes (Signed)
Pt c/o chest pain and sob x 1 week.  Reports pain is in center of chest and radiates to her back.  Reports pain started as a a pins and needle sensation and now describes as pressure.

## 2019-06-15 NOTE — ED Provider Notes (Signed)
Dr. Tenny Craw, cardiology will see the pt 10 am Sara Sinclair, MD 06/15/19 [phone]

## 2019-06-15 NOTE — ED Provider Notes (Signed)
[facility] EMERGENCY DEPARTMENT Provider Note   CSN: [id number] Arrival date & time: 06/15/19  1005     History Chief Complaint  Patient presents with  . Chest Pain    Sara Stuart is a 64 y.o. female.  HPI  HPI: A 64 year old patient with a history of treated diabetes, hypertension, hypercholesterolemia and obesity presents for evaluation of chest pain. Initial onset of pain was more than 6 hours ago. The patient's chest pain is described as heaviness/pressure/tightness and is worse with exertion. The patient reports some diaphoresis. The patient's chest pain is middle- or left-sided, is not well-localized, is not sharp and does radiate to the arms/jaw/neck. The patient does not complain of nausea. The patient has no history of stroke, has no history of peripheral artery disease, has not smoked in the past 90 days and has no relevant family history of coronary artery disease (first degree relative at less than age 33).   Past Medical History:  Diagnosis Date  . Arthritis   . Chronic back pain    spondylolishthesis  . DDD (degenerative disc disease), lumbosacral   . Diabetes mellitus without complication ([place]) diagnosed in 02/2014   takes Metformin and Glipizide daily  . GERD (gastroesophageal reflux disease)    takes Omeprazole daily  . Gout    takes Colchicine daily as needed  . Hypertension    takes Dyazide and Diltiazem daily  . Nocturia   . Polio   . PONV (postoperative nausea and vomiting)   . Wears glasses     Patient Active Problem List   Diagnosis Date Noted  . Mixed hyperlipidemia 10/10/2017  . DM type 2 causing vascular disease ([place]) 03/21/2017  . Uncontrolled type 2 diabetes mellitus with hyperglycemia ([place]) 03/14/2017  . Essential hypertension, benign 03/14/2017  . Chest pain 12/29/2012  . GERD (gastroesophageal reflux disease) 12/29/2012  . History of gout 12/29/2012  . HTN (hypertension) 12/29/2012  . 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 Morbid  obesity ([place]) 12/29/2012  . Degenerative spondylolisthesis 07/08/2012  . 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 ABDOMINAL HYSTERECTOMY     TUBAL PREGNANCY  . BACK SURGERY  07/2012  . bladder tacked     . COLONOSCOPY    . FOOT SURGERY     more than 5 surgeries on right foot related to polio  . surgery for polio     states x 5   . TUBAL LIGATION       OB History   No obstetric history on file.     Family History  Problem Relation Age of Onset  . Cancer Mother   . Diabetes Father   . Thyroid disease Father   . Heart attack Father     Social History   Tobacco Use  . Smoking status: Never Smoker  . Smokeless tobacco: Never Used  Vaping Use  . Vaping Use: Never used  Substance Use Topics  . Alcohol use: No  . Drug use: No    Home Medications Prior to Admission medications   Medication Sig Start Date End Date Taking? Authorizing Provider  aspirin EC 81 MG tablet Take 1 tablet (81 mg total) by mouth daily. 12/30/12  Yes Tat, Shanon Brow, MD  atorvastatin (LIPITOR) 20 MG tablet Take 1 tablet (20 mg total) by mouth daily. 10/10/17  Yes Nida, Marella Chimes, MD  blood glucose meter kit and supplies KIT Dispense based on patient and insurance  preference. Use up to four times daily as directed. (FOR ICD-10 E11.65) 03/22/17  Yes Nida, Marella Chimes, MD  diltiazem (DILACOR XR) 120 MG 24 hr capsule Take 120 mg by mouth daily.   Yes [provider]  fluticasone (FLONASE) 50 MCG/ACT nasal spray Place 2 sprays into both nostrils daily as needed.  06/11/17  Yes [provider]  glucose blood test strip 1 each by Other route 4 (four) times daily. Use as instructed qid E11.65 Relion glucometer 03/21/17  Yes Nida, Marella Chimes, MD  ibuprofen (ADVIL,MOTRIN) 800 MG tablet Take 800 mg by mouth every 8 (eight) hours as needed for moderate pain.   Yes [provider]  Insulin  Glargine (LANTUS) 100 UNIT/ML Solostar Pen Inject 60 Units into the skin at bedtime. 10/10/17  Yes Nida, Marella Chimes, MD  insulin lispro (HUMALOG KWIKPEN) 100 UNIT/ML KiwkPen Inject 0.15-0.21 mLs (15-21 Units total) into the skin 3 (three) times daily. 10/10/17  Yes Nida, Marella Chimes, MD  Insulin Pen Needle (B-D ULTRAFINE III SHORT PEN) 31G X 8 MM MISC 1 each by Does not apply route as directed. 03/21/17  Yes Nida, Marella Chimes, MD  lisinopril (PRINIVIL,ZESTRIL) 2.5 MG tablet Take 2.5 mg by mouth daily. 10/04/17  Yes [provider]  loperamide (IMODIUM) 2 MG capsule Take 2 mg by mouth as needed for diarrhea or loose stools.   Yes [provider]  metFORMIN (GLUCOPHAGE-XR) 500 MG 24 hr tablet Take 1,000 mg by mouth 2 (two) times daily. 05/30/18  Yes [provider]  metoprolol tartrate (LOPRESSOR) 25 MG tablet Take 5 tablets by mouth 2 (two) times daily. 06/12/19  Yes [provider]  omeprazole (PRILOSEC) 40 MG capsule Take 40 mg by mouth daily.   Yes [provider]  potassium chloride (K-DUR) 10 MEQ tablet Take 10 mEq by mouth daily.   Yes [provider]  traZODone (DESYREL) 50 MG tablet Take 50 mg by mouth at bedtime.  05/15/18  Yes [provider]  triamterene-hydrochlorothiazide (MAXZIDE-25) 37.5-25 MG tablet Take 1 tablet by mouth daily. 11/11/17  Yes [provider]    Allergies    Patient has no known allergies.  Review of Systems   Review of Systems  Constitutional: Positive for activity change.  Respiratory: Positive for shortness of breath.   Cardiovascular: Positive for chest pain.  Hematological: Does not bruise/bleed easily.  All other systems reviewed and are negative.   Physical Exam Updated Vital Signs BP 109/78   Pulse 66   Temp 98.2 F (36.8 C) (Oral)   Resp 19   Ht 5' (1.524 m)   Wt 99.8 kg   SpO2 97%   BMI 42.97 kg/m   Physical Exam Vitals and nursing note reviewed.    Constitutional:      Appearance: She is well-developed.  HENT:     Head: Normocephalic and atraumatic.  Cardiovascular:     Rate and Rhythm: Normal rate.     Heart sounds: Normal heart sounds.  Pulmonary:     Effort: Pulmonary effort is normal.  Abdominal:     General: Bowel sounds are normal.  Musculoskeletal:     Cervical back: Normal range of motion and neck supple.  Skin:    General: Skin is warm and dry.  Neurological:     Mental Status: She is alert and oriented to person, place, and time.     ED Results / Procedures / Treatments   Labs (all labs ordered are listed, but only abnormal  results are displayed) Labs Reviewed  BASIC METABOLIC PANEL - Abnormal; Notable for the following components:      Result Value   Glucose, Bld 297 (*)    Creatinine, Ser 1.06 (*)    Calcium 8.5 (*)    GFR calc non Af Amer 56 (*)    All other components within normal limits  CBC  TROPONIN I (HIGH SENSITIVITY)  TROPONIN I (HIGH SENSITIVITY)    EKG EKG Interpretation  Date/Time:  Monday June 15 2019 10:17:31 EDT Ventricular Rate:  69 PR Interval:    QRS Duration: 93 QT Interval:  394 QTC Calculation: 423 R Axis:   -36 Text Interpretation: Sinus tachycardia Ventricular tachycardia, unsustained Inferior infarct, old Probable anteroseptal infarct, old Lateral leads are also involved Interpretation limited secondary to artifact Reconfirmed by Varney Biles [phone]) on 06/15/2019 3:52:56 PM   Radiology DG Chest 2 View  Result Date: 06/15/2019 CLINICAL DATA:  Chest pain, shortness of breath EXAM: CHEST - 2 VIEW COMPARISON:  06/04/2018 FINDINGS: The heart size and mediastinal contours are within normal limits. Streaky interstitial opacity within the left lung base. No pleural effusion or pneumothorax. The visualized skeletal structures are unremarkable. IMPRESSION: Streaky interstitial opacity within the left lung base, which may reflect atelectasis versus developing infiltrate.  Electronically Signed   By: Davina Poke D.O.   On: 06/15/2019 11:11    Procedures Procedures (including critical care time)  Medications Ordered in ED Medications  nitroGLYCERIN (NITROSTAT) SL tablet 0.4 mg (0.4 mg Sublingual Given 06/15/19 1154)  aspirin chewable tablet 162 mg (162 mg Oral Given 06/15/19 1153)    ED Course  I have reviewed the triage vital signs and the nursing notes.  Pertinent labs & imaging results that were available during my care of the patient were reviewed by me and considered in my medical decision making (see chart for details).    MDM Rules/Calculators/A&P HEAR Score: 47                        64 year old female comes in a chief complaint of left-sided chest pain radiating to the back.  She was given nitro and her pain is improved.  Her hear score is 5.  EKG does not show any acute changes. High-sensitivity troponin is negative x2.  Patient is feeling a lot better.  Based on history and exam it does not appear that the pain is musculoskeletal in nature or pleuritic in nature.  I have requested that patient be seen in the clinic for stress test soon.  Dr. Roderic Palau to follow.  Final Clinical Impression(s) / ED Diagnoses Final diagnoses:  None    Rx / DC Orders ED Discharge Orders    None       Varney Biles, MD 06/15/19 1637

## 2019-06-15 NOTE — Discharge Instructions (Signed)
Follow up with Dr. Tenny Craw at 10 am Friday here at [facility]  Return if any problem

## 2019-06-18 ENCOUNTER — Ambulatory Visit (INDEPENDENT_AMBULATORY_CARE_PROVIDER_SITE_OTHER): Payer: Commercial Managed Care - PPO | Admitting: Gastroenterology

## 2019-06-19 ENCOUNTER — Ambulatory Visit: Payer: Commercial Managed Care - PPO | Admitting: Internal Medicine

## 2019-06-19 ENCOUNTER — Encounter: Payer: Self-pay | Admitting: Internal Medicine

## 2019-06-19 ENCOUNTER — Other Ambulatory Visit: Payer: Self-pay

## 2019-06-19 VITALS — BP 112/70 | HR 76 | Ht 60.0 in | Wt 226.6 lb

## 2019-06-19 DIAGNOSIS — I259 Chronic ischemic heart disease, unspecified: Secondary | ICD-10-CM | POA: Diagnosis not present

## 2019-06-19 DIAGNOSIS — I209 Angina pectoris, unspecified: Secondary | ICD-10-CM

## 2019-06-19 MED ORDER — METOPROLOL TARTRATE 100 MG PO TABS: ORAL_TABLET | ORAL | 0 refills | Status: DC

## 2019-06-19 MED ORDER — NITROGLYCERIN 0.4 MG SL SUBL: 0.4000 mg | SUBLINGUAL_TABLET | SUBLINGUAL | 3 refills | Status: DC | PRN

## 2019-06-19 NOTE — Progress Notes (Signed)
Cardiology Office Note   Date:  06/19/2019   ID:  Sara Stuart, DOB 21-Jun-1955, MRN [id number]  PCP:  [facility]  Cardiologist:   Dorris Carnes, MD   Pt referred by PCP for evaluation of CP     History of Present Illness: Sara Stuart is a 64 y.o. female with a history of DM, HTN, HL and obesity   She has had a hx of CP   Has been seen by [facility] in past  Last visit in 2021    Pt had myovue in June 2020 which was normal  Seen in ED on 6/14 for CP   Pain started 6 hours prior   Heavy, pressure   Worse with exertion.   + diaphoresis.   Trop neg x 2   Pt's symptoms eased  She was sent home    Patient describes her CP as 2 types 1.  Needles  Stabbing  Last seconds       2  Pressure that stays in chest all the time    Not associated with activity.  Monday she says she  had both types    Had stabbing   Alos had pressure that stayed there   Says the pressure went to back  Descrived a little SOB   She says she can wake up with it     No PND  No LE edema       Current Meds  Medication Sig  . aspirin EC 81 MG tablet Take 1 tablet (81 mg total) by mouth daily.  Marland Kitchen atorvastatin (LIPITOR) 20 MG tablet Take 1 tablet (20 mg total) by mouth daily.  . blood glucose meter kit and supplies KIT Dispense based on patient and insurance preference. Use up to four times daily as directed. (FOR ICD-10 E11.65)  . diltiazem (DILACOR XR) 120 MG 24 hr capsule Take 120 mg by mouth daily.  . fluticasone (FLONASE) 50 MCG/ACT nasal spray Place 2 sprays into both nostrils daily as needed.   Marland Kitchen glucose blood test strip 1 each by Other route 4 (four) times daily. Use as instructed qid E11.65 Relion glucometer  . ibuprofen (ADVIL,MOTRIN) 800 MG tablet Take 800 mg by mouth every 8 (eight) hours as needed for moderate pain.  . Insulin Glargine (LANTUS) 100 UNIT/ML Solostar Pen Inject 60 Units into the skin at bedtime.  . insulin lispro (HUMALOG KWIKPEN) 100 UNIT/ML KiwkPen Inject 0.15-0.21 mLs (15-21 Units  total) into the skin 3 (three) times daily.  . Insulin Pen Needle (B-D ULTRAFINE III SHORT PEN) 31G X 8 MM MISC 1 each by Does not apply route as directed.  Marland Kitchen lisinopril (PRINIVIL,ZESTRIL) 2.5 MG tablet Take 2.5 mg by mouth daily.  Marland Kitchen loperamide (IMODIUM) 2 MG capsule Take 2 mg by mouth as needed for diarrhea or loose stools.  . metFORMIN (GLUCOPHAGE-XR) 500 MG 24 hr tablet Take 1,000 mg by mouth 2 (two) times daily.  . metoprolol tartrate (LOPRESSOR) 25 MG tablet Take 5 tablets by mouth 2 (two) times daily.  Marland Kitchen omeprazole (PRILOSEC) 40 MG capsule Take 40 mg by mouth daily.  . potassium chloride (K-DUR) 10 MEQ tablet Take 10 mEq by mouth daily.  . traZODone (DESYREL) 50 MG tablet Take 50 mg by mouth at bedtime.   . triamterene-hydrochlorothiazide (MAXZIDE-25) 37.5-25 MG tablet Take 1 tablet by mouth daily.     Allergies:   Patient has no known allergies.   Past Medical History:  Diagnosis Date  . Arthritis   .  Chronic back pain    spondylolishthesis  . DDD (degenerative disc disease), lumbosacral   . Diabetes mellitus without complication ([place]) diagnosed in 02/2014   takes Metformin and Glipizide daily  . GERD (gastroesophageal reflux disease)    takes Omeprazole daily  . Gout    takes Colchicine daily as needed  . Hypertension    takes Dyazide and Diltiazem daily  . Nocturia   . Polio   . PONV (postoperative nausea and vomiting)   . Wears glasses     Past Surgical History:  Procedure Laterality Date  . ABDOMINAL HYSTERECTOMY     TUBAL PREGNANCY  . BACK SURGERY  07/2012  . bladder tacked     . COLONOSCOPY    . FOOT SURGERY     more than 5 surgeries on right foot related to polio  . surgery for polio     states x 5   . TUBAL LIGATION       Social History:  The patient  reports that she has never smoked. She has never used smokeless tobacco. She reports that she does not drink alcohol and does not use drugs.   Family History:  The patient's family history includes Cancer  in her mother; Diabetes in her father; Heart attack in her father; Thyroid disease in her father.    ROS:  Please see the history of present illness. All other systems are reviewed and  Negative to the above problem except as noted.    PHYSICAL EXAM: VS:  Ht 5' (1.524 m)   BMI 42.97 kg/m   GEN: Well nourished, well developed, in no acute distress  HEENT: normal  Neck: no JVD, carotid bruits Chest:  nontender  Cardiac: RRR; no murmurs, rubs, or gallops,no edema  Respiratory:  clear to auscultation bilaterally, normal work of breathing GI: soft, nontender, nondistended, + BS  No hepatomegaly  MS: no deformity Moving all extremities   Skin: warm and dry, no rash Neuro:  Strength and sensation are intact Psych: euthymic mood, full affect   EKG:  EKG is ordered today.   Lipid Panel    Component Value Date/Time   CHOL 197 05/02/2017 1617   TRIG 217 (H) 05/02/2017 1617   HDL 52 05/02/2017 1617   CHOLHDL 3.8 05/02/2017 1617   LDLCALC 111 (H) 05/02/2017 1617      Wt Readings from Last 3 Encounters:  06/15/19 220 lb (99.8 kg)  06/04/18 231 lb 11.3 oz (105.1 kg)  10/10/17 219 lb (99.3 kg)      ASSESSMENT AND PLAN:  1  Chest pain.   2 types   Stabbling sensation atypical   I think more musculoskeletal in origin  Pressure also atypical at times   But, concerning.    Denies GI symptoms (GERD or difficulty swallowing which could explain.    Would set up for a coronary CT angiogram esp given risk factors   Take activity as tolerated   Rx NTG as needed   2  HTN  BP is controlled   3   HL   Willn eed to be followed   On statin  4  DM  COntinue meds   FOllow up based on test results    Current medicines are reviewed at length with the patient today.  The patient does not have concerns regarding medicines.  Signed, Dorris Carnes, MD  06/19/2019 8:47 AM    [address], [place], [place]  [address] Phone: [phone];  Fax: [phone]

## 2019-06-19 NOTE — Patient Instructions (Addendum)
Medication Instructions:  Your physician recommends that you continue on your current medications as directed. Please refer to the Current Medication list given to you today.  Take nitroglycerin as directed for chest pain.  *If you need a refill on your cardiac medications before your next appointment, please call your pharmacy*   Lab Work: None today  If you have labs (blood work) drawn today and your tests are completely normal, you will receive your results only by: Marland Kitchen MyChart Message (if you have MyChart) OR . A paper copy in the mail If you have any lab test that is abnormal or we need to change your treatment, we will call you to review the results.   Testing/Procedures: Your physician has requested that you have cardiac CT. Cardiac computed tomography (CT) is a painless test that uses an x-ray machine to take clear, detailed pictures of your heart. For further information please visit [email or web address] Please follow instruction sheet as given.     Follow-Up: At [facility], you and your health needs are our priority.  As part of our continuing mission to provide you with exceptional heart care, we have created designated Provider Care Teams.  These Care Teams include your primary Cardiologist (physician) and Advanced Practice Providers (APPs -  Physician Assistants and Nurse Practitioners) who all work together to provide you with the care you need, when you need it.  We recommend signing up for the patient portal called "MyChart".  Sign up information is provided on this After Visit Summary.  MyChart is used to connect with patients for Virtual Visits (Telemedicine).  Patients are able to view lab/test results, encounter notes, upcoming appointments, etc.  Non-urgent messages can be sent to your provider as well.   To learn more about what you can do with MyChart, go to [email or web address].    Your next appointment:  Your follow up will be determined after your  CT     Thank you for choosing [facility] !

## 2019-07-27 ENCOUNTER — Other Ambulatory Visit: Payer: Self-pay | Admitting: Internal Medicine

## 2019-07-27 NOTE — Telephone Encounter (Signed)
Called and spoke with patient and asked her if she had requested metoprolol tartrate 100 mg from her pharmacy, She stated she did. I explained it was just one tablet before CT scan sent in last month and asked if she had meant to have the metoprolol tartrate 25 mg filled and she stated she did not. She stated that her pharmacy gave her a whole bottle of metoprolol tartrate 100 mg and she has been taking it and not the 25 mg. Called her pharmacy [facility] and on June 11th 2021 she had metoprolol tartrate 25 mg filled and on June 18th she had the metoprolol tartrate 100 mg filled. Will forward to RN to call patient.

## 2019-07-30 NOTE — Telephone Encounter (Signed)
I spoke with the patient. She confirms she has a bottle of metoprolol tartrate 100 mg and said there were 30 pills in it. And that the label says to take 1 tablet two hours prior to CT. She has been cutting these tablets into quarters and taking one every day.  Then I called the [facility].  Spoke with pharmacist, Joe. He said her directions for metoprolol tartrate current script are take 12.5 mg bid and then they have the 100 mg prescription.    He is aware I asked the patient to bring the bottle of metoprolol to the pharmacy and ask them to give her a new supply with correct label/directions of the 25 mg and also a new bottle/label for the ONE 100 mg tablet for the cardiac CT.  He is in agreement w this plan. I will forward to Dr. Charlott Rakes nurse in [place] to inform, follow up.

## 2019-07-31 ENCOUNTER — Other Ambulatory Visit: Payer: Self-pay | Admitting: *Deleted

## 2019-08-18 ENCOUNTER — Other Ambulatory Visit: Payer: Self-pay

## 2019-08-18 DIAGNOSIS — Z01818 Encounter for other preprocedural examination: Secondary | ICD-10-CM

## 2019-08-24 ENCOUNTER — Telehealth: Payer: Self-pay | Admitting: Internal Medicine

## 2019-08-24 ENCOUNTER — Other Ambulatory Visit (HOSPITAL_COMMUNITY)
Admission: RE | Admit: 2019-08-24 | Discharge: 2019-08-24 | Disposition: A | Discharge status: Home / Self Care | Payer: 59 | Source: Ambulatory Visit | Attending: Internal Medicine | Admitting: Internal Medicine

## 2019-08-24 DIAGNOSIS — Z01818 Encounter for other preprocedural examination: Secondary | ICD-10-CM | POA: Diagnosis present

## 2019-08-24 LAB — BASIC METABOLIC PANEL
Anion gap: 9 (ref 5–15)
BUN: 18 mg/dL (ref 8–23)
CO2: 24 mmol/L (ref 22–32)
Calcium: 8.9 mg/dL (ref 8.9–10.3)
Chloride: 102 mmol/L (ref 98–111)
Creatinine, Ser: 0.76 mg/dL (ref 0.44–1.00)
GFR calc Af Amer: >60 mL/min (ref >60)
GFR calc non Af Amer: >60 mL/min (ref >60)
Glucose, Bld: 149 mg/dL — ABNORMAL HIGH (ref 70–99)
Potassium: 3.3 mmol/L — ABNORMAL LOW (ref 3.5–5.1)
Sodium: 135 mmol/L (ref 135–145)

## 2019-08-24 NOTE — Telephone Encounter (Signed)
Lab sheets printed and placed at front desk for pt pick up. Pt notified and instructed on where to go for labs.

## 2019-08-24 NOTE — Telephone Encounter (Signed)
Pt not sure if she has paper to get lab work before CT scheduled next Tuesday.   Please call [phone]   Thanks renee

## 2019-08-28 ENCOUNTER — Telehealth: Payer: Self-pay

## 2019-08-28 DIAGNOSIS — I10 Essential (primary) hypertension: Secondary | ICD-10-CM

## 2019-08-28 MED ORDER — SPIRONOLACTONE 25 MG PO TABS
12.5000 mg | ORAL_TABLET | Freq: Every day | ORAL | 3 refills | Status: DC
Start: 2019-08-28 — End: 2019-09-09

## 2019-08-28 NOTE — Telephone Encounter (Signed)
Pt made aware per Dr. Tenny Craw to stop potassium and Maxide and start Spironolactone 12.5 mg daily. Pt will come in for labs next Thursday. Pt verbalized understanding.

## 2019-08-31 ENCOUNTER — Telehealth (HOSPITAL_COMMUNITY): Payer: Self-pay | Admitting: Emergency Medicine

## 2019-08-31 NOTE — Telephone Encounter (Signed)
Reaching out to patient to offer assistance regarding upcoming cardiac imaging study; pt verbalizes understanding of appt date/time, parking situation and where to check in, pre-test NPO status and medications ordered, and verified current allergies; name and call back number provided for further questions should they arise Kalese Ensz RN Navigator Cardiac Imaging [facility] [phone] office [phone] cell 

## 2019-09-01 ENCOUNTER — Ambulatory Visit (HOSPITAL_COMMUNITY)
Admission: RE | Admit: 2019-09-01 | Discharge: 2019-09-01 | Disposition: A | Discharge status: Home / Self Care | Payer: 59 | Source: Ambulatory Visit | Attending: Internal Medicine | Admitting: Internal Medicine

## 2019-09-01 DIAGNOSIS — I209 Angina pectoris, unspecified: Secondary | ICD-10-CM | POA: Insufficient documentation

## 2019-09-01 DIAGNOSIS — I259 Chronic ischemic heart disease, unspecified: Secondary | ICD-10-CM | POA: Diagnosis present

## 2019-09-01 MED ORDER — NITROGLYCERIN 0.4 MG SL SUBL
SUBLINGUAL_TABLET | SUBLINGUAL | Status: AC
  Filled 2019-09-01: qty 2

## 2019-09-01 MED ORDER — IOHEXOL 350 MG/ML SOLN
80.0000 mL | Freq: Once | INTRAVENOUS | Status: AC | PRN
  Administered 2019-09-01: 80 mL via INTRAVENOUS

## 2019-09-01 MED ORDER — NITROGLYCERIN 0.4 MG SL SUBL
0.8000 mg | SUBLINGUAL_TABLET | Freq: Once | SUBLINGUAL | Status: AC
  Administered 2019-09-01: 0.8 mg via SUBLINGUAL

## 2019-09-03 ENCOUNTER — Other Ambulatory Visit: Payer: Self-pay

## 2019-09-03 ENCOUNTER — Other Ambulatory Visit: Payer: 59

## 2019-09-03 DIAGNOSIS — I10 Essential (primary) hypertension: Secondary | ICD-10-CM

## 2019-09-03 LAB — BASIC METABOLIC PANEL
BUN/Creatinine Ratio: 17 (ref 12–28)
BUN: 13 mg/dL (ref 8–27)
CO2: 22 mmol/L (ref 20–29)
Calcium: 8.6 mg/dL — ABNORMAL LOW (ref 8.7–10.3)
Chloride: 103 mmol/L (ref 96–106)
Creatinine, Ser: 0.77 mg/dL (ref 0.57–1.00)
GFR calc Af Amer: 95 mL/min/{1.73_m2} (ref >59)
GFR calc non Af Amer: 82 mL/min/{1.73_m2} (ref >59)
Glucose: 285 mg/dL — ABNORMAL HIGH (ref 65–99)
Potassium: 3.7 mmol/L (ref 3.5–5.2)
Sodium: 140 mmol/L (ref 134–144)

## 2019-09-09 ENCOUNTER — Other Ambulatory Visit: Payer: Self-pay

## 2019-09-09 ENCOUNTER — Encounter: Payer: Self-pay | Admitting: Family Medicine

## 2019-09-09 ENCOUNTER — Ambulatory Visit (INDEPENDENT_AMBULATORY_CARE_PROVIDER_SITE_OTHER): Payer: 59 | Admitting: Family Medicine

## 2019-09-09 VITALS — BP 131/82 | HR 56 | Temp 97.8°F | Ht 61.0 in | Wt 228.2 lb

## 2019-09-09 DIAGNOSIS — I10 Essential (primary) hypertension: Secondary | ICD-10-CM

## 2019-09-09 DIAGNOSIS — E1165 Type 2 diabetes mellitus with hyperglycemia: Secondary | ICD-10-CM | POA: Diagnosis not present

## 2019-09-09 DIAGNOSIS — K219 Gastro-esophageal reflux disease without esophagitis: Secondary | ICD-10-CM | POA: Diagnosis not present

## 2019-09-09 LAB — BAYER DCA HB A1C WAIVED: HB A1C (BAYER DCA - WAIVED): 7.4 % — ABNORMAL HIGH (ref <7.0)

## 2019-09-09 MED ORDER — DILTIAZEM HCL ER 120 MG PO CP24: 120.0000 mg | ORAL_CAPSULE | Freq: Every day | ORAL | 1 refills | Status: DC

## 2019-09-09 MED ORDER — SPIRONOLACTONE 25 MG PO TABS: 12.5000 mg | ORAL_TABLET | Freq: Every day | ORAL | 3 refills | Status: DC

## 2019-09-09 MED ORDER — TRIAMTERENE-HCTZ 37.5-25 MG PO CAPS: 1.0000 | ORAL_CAPSULE | Freq: Every day | ORAL | 1 refills | Status: DC

## 2019-09-09 MED ORDER — TRAZODONE HCL 50 MG PO TABS: 50.0000 mg | ORAL_TABLET | Freq: Every day | ORAL | 1 refills | Status: DC

## 2019-09-09 MED ORDER — POTASSIUM CHLORIDE ER 10 MEQ PO TBCR: 10.0000 meq | EXTENDED_RELEASE_TABLET | Freq: Every day | ORAL | 1 refills | Status: DC

## 2019-09-09 MED ORDER — OMEPRAZOLE 40 MG PO CPDR: 40.0000 mg | DELAYED_RELEASE_CAPSULE | Freq: Every day | ORAL | 1 refills | Status: DC

## 2019-09-09 MED ORDER — LISINOPRIL 2.5 MG PO TABS: 2.5000 mg | ORAL_TABLET | Freq: Every day | ORAL | 1 refills | Status: DC

## 2019-09-09 MED ORDER — METFORMIN HCL ER 500 MG PO TB24: 1000.0000 mg | ORAL_TABLET | Freq: Two times a day (BID) | ORAL | 5 refills | Status: DC

## 2019-09-10 LAB — MICROALBUMIN / CREATININE URINE RATIO
Creatinine, Urine: 51.5 mg/dL
Microalb/Creat Ratio: <6 mg/g creat (ref 0–29)
Microalbumin, Urine: <3 ug/mL

## 2019-09-14 ENCOUNTER — Telehealth: Payer: Self-pay | Admitting: Family Medicine

## 2019-09-14 ENCOUNTER — Encounter: Payer: Self-pay | Admitting: Family Medicine

## 2019-09-14 ENCOUNTER — Other Ambulatory Visit: Payer: Self-pay | Admitting: Family Medicine

## 2019-09-14 DIAGNOSIS — E1165 Type 2 diabetes mellitus with hyperglycemia: Secondary | ICD-10-CM

## 2019-09-14 MED ORDER — FREESTYLE LIBRE 2 SENSOR MISC: 1.0000 | 3 refills | Status: DC

## 2019-09-14 MED ORDER — FREESTYLE LIBRE 2 READER DEVI: 1.0000 | Freq: Four times a day (QID) | 0 refills | Status: DC

## 2019-09-14 MED ORDER — TRULICITY 0.75 MG/0.5ML ~~LOC~~ SOAJ: 0.7500 mg | SUBCUTANEOUS | 0 refills | Status: DC

## 2019-09-14 NOTE — Telephone Encounter (Signed)
Trulicity has been sent in now, pt will call back to  [facility]

## 2019-09-14 NOTE — Progress Notes (Signed)
New Patient Office Visit  Assessment & Plan:  1. Uncontrolled type 2 diabetes mellitus with hyperglycemia (HCC) Lab Results  Component Value Date   HGBA1C 7.4 (H) 09/09/2019   HGBA1C 9.2 (H) 06/04/2018   HGBA1C 9.2 (A) 09/04/2017  - Diabetes is not at goal of A1c < 7. - Medications: continue current medications, will discuss with clinical pharmacist and get back to her. We discussed adding once weekly medication and decreasing some of her insulin. - Home glucose monitoring: continue monitoring QID. - Patient is currently taking a statin. Patient is taking an ACE-inhibitor/ARB.  - Last foot exam: 09/09/2019 - Last diabetic eye exam: unknown - Urine Microalbumin/Creat Ratio: 09/09/2019 - Instruction/counseling given: reminded to get eye exam and discussed foot care - Bayer DCA Hb A1c Waived - Microalbumin / creatinine urine ratio  2. Essential hypertension, benign - Well controlled on current regimen.   3. Gastroesophageal reflux disease without esophagitis - Well controlled on current regimen.    Follow-up: Return in about 3 months (around 12/09/2019) for follow-up of chronic medication conditions.   [name], MSN, APRN, FNP-C [facility]  Subjective:  Patient ID: Sara Stuart, female    DOB: [birth year]  Age: 64 y.o. MRN: [id number]  Patient Care Team: Gwenlyn Fudge, FNP as PCP - General (Family Medicine)  CC:  Chief Complaint  Patient presents with  . New Patient (Initial Visit)    [facility]   . Establish Care  . Diabetes    check up of chronic medical conditions    HPI Sara Stuart presents to establish care. Patient is transferring care due to her insurance.   Diabetes: Patient presents for follow up of diabetes. Current symptoms include: hyperglycemia. Known diabetic complications: cardiovascular disease. Medication compliance: yes - taking metformin 1,000 mg BID, Lantus QHS (40 units 90% of the time, 50 units if she eats  bad), and Humalog 6-15 units with meals. Patient takes 6 units for BG 100-150 if she eats bread, 10-12 units for BG 150-220, and 12-15 units for BG > 220 (which only occurs 1-2 times per week). Current diet: in general, a "healthy" diet  . Current exercise: housecleaning and yard work. Home blood sugar records: checks BG QID - before meals and at bedtime. Is she  on ACE inhibitor or angiotensin II receptor blocker? Yes. Is she on a statin? Yes.   Lab Results  Component Value Date   HGBA1C 7.4 (H) 09/09/2019   HGBA1C 9.2 (H) 06/04/2018   HGBA1C 9.2 (A) 09/04/2017   Lab Results  Component Value Date   MICROALBUR 1.8 05/02/2017   LDLCALC 111 (H) 05/02/2017   CREATININE 0.77 09/03/2019     Review of Systems  Constitutional: Negative for chills, fever, malaise/fatigue and weight loss.  HENT: Negative for congestion, ear discharge, ear pain, nosebleeds, sinus pain, sore throat and tinnitus.   Eyes: Negative for blurred vision, double vision, pain, discharge and redness.  Respiratory: Negative for cough, shortness of breath and wheezing.   Cardiovascular: Negative for chest pain, palpitations and leg swelling.  Gastrointestinal: Negative for abdominal pain, constipation, diarrhea, heartburn, nausea and vomiting.  Genitourinary: Negative for dysuria, frequency and urgency.  Musculoskeletal: Negative for myalgias.  Skin: Negative for rash.  Neurological: Negative for dizziness, seizures, weakness and headaches.  Psychiatric/Behavioral: Negative for depression, substance abuse and suicidal ideas. The patient is not nervous/anxious.     Current Outpatient Medications:  .  aspirin EC 81 MG tablet, Take 1 tablet (81 mg  total) by mouth daily., Disp: 30 tablet, Rfl:  .  baclofen (LIORESAL) 10 MG tablet, Take 10 mg by mouth 3 (three) times daily as needed., Disp: , Rfl:  .  diltiazem (DILACOR XR) 120 MG 24 hr capsule, Take 1 capsule (120 mg total) by mouth daily., Disp: 90 capsule, Rfl: 1 .   fluticasone (FLONASE) 50 MCG/ACT nasal spray, Place 2 sprays into both nostrils daily as needed. , Disp: , Rfl:  .  glucose blood test strip, 1 each by Other route 4 (four) times daily. Use as instructed qid E11.65 Relion glucometer, Disp: 150 each, Rfl: 5 .  ibuprofen (ADVIL,MOTRIN) 800 MG tablet, Take 800 mg by mouth every 8 (eight) hours as needed for moderate pain., Disp: , Rfl:  .  Insulin Glargine (LANTUS) 100 UNIT/ML Solostar Pen, Inject 60 Units into the skin at bedtime., Disp: 10 pen, Rfl: 2 .  insulin lispro (HUMALOG KWIKPEN) 100 UNIT/ML KiwkPen, Inject 0.15-0.21 mLs (15-21 Units total) into the skin 3 (three) times daily., Disp: 10 pen, Rfl: 2 .  Insulin Pen Needle (B-D ULTRAFINE III SHORT PEN) 31G X 8 MM MISC, 1 each by Does not apply route as directed., Disp: 100 each, Rfl: 3 .  lisinopril (ZESTRIL) 2.5 MG tablet, Take 1 tablet (2.5 mg total) by mouth daily., Disp: 90 tablet, Rfl: 1 .  loperamide (IMODIUM) 2 MG capsule, Take 2 mg by mouth as needed for diarrhea or loose stools., Disp: , Rfl:  .  metFORMIN (GLUCOPHAGE-XR) 500 MG 24 hr tablet, Take 2 tablets (1,000 mg total) by mouth 2 (two) times daily., Disp: 120 tablet, Rfl: 5 .  metoprolol tartrate (LOPRESSOR) 25 MG tablet, Take 25 mg by mouth daily. , Disp: , Rfl:  .  nitroGLYCERIN (NITROSTAT) 0.4 MG SL tablet, Place 1 tablet (0.4 mg total) under the tongue every 5 (five) minutes as needed., Disp: 25 tablet, Rfl: 3 .  omeprazole (PRILOSEC) 40 MG capsule, Take 1 capsule (40 mg total) by mouth daily., Disp: 90 capsule, Rfl: 1 .  potassium chloride (KLOR-CON) 10 MEQ tablet, Take 1 tablet (10 mEq total) by mouth daily., Disp: 90 tablet, Rfl: 1 .  spironolactone (ALDACTONE) 25 MG tablet, Take 0.5 tablets (12.5 mg total) by mouth daily., Disp: 45 tablet, Rfl: 3 .  traZODone (DESYREL) 50 MG tablet, Take 1 tablet (50 mg total) by mouth at bedtime., Disp: 90 tablet, Rfl: 1 .  triamterene-hydrochlorothiazide (DYAZIDE) 37.5-25 MG capsule, Take 1  each (1 capsule total) by mouth daily., Disp: 90 capsule, Rfl: 1  No Known Allergies  Past Medical History:  Diagnosis Date  . Arthritis   . Chronic back pain    spondylolishthesis  . DDD (degenerative disc disease), lumbosacral   . Diabetes mellitus without complication (HCC) diagnosed in 02/2014   takes Metformin and Glipizide daily  . GERD (gastroesophageal reflux disease)    takes Omeprazole daily  . Gout    takes Colchicine daily as needed  . Hypertension    takes Dyazide and Diltiazem daily  . Nocturia   . Polio   . PONV (postoperative nausea and vomiting)   . Wears glasses     Past Surgical History:  Procedure Laterality Date  . ABDOMINAL HYSTERECTOMY     TUBAL PREGNANCY  . BACK SURGERY  07/2012  . bladder tacked     . COLONOSCOPY    . FOOT SURGERY     more than 5 surgeries on right foot related to polio  . surgery for polio  states x 5   . TUBAL LIGATION      Family History  Problem Relation Age of Onset  . Colon cancer Mother   . Diabetes Father   . Thyroid disease Father   . Heart attack Father     Social History   Socioeconomic History  . Marital status: Married    Spouse name: Not on file  . Number of children: Not on file  . Years of education: Not on file  . Highest education level: Not on file  Occupational History  . Not on file  Tobacco Use  . Smoking status: Never Smoker  . Smokeless tobacco: Never Used  Vaping Use  . Vaping Use: Never used  Substance and Sexual Activity  . Alcohol use: No  . Drug use: No  . Sexual activity: Yes    Birth control/protection: Surgical  Other Topics Concern  . Not on file  Social History Narrative  . Not on file   Social Determinants of Health   Financial Resource Strain:   . Difficulty of Paying Living Expenses: Not on file  Food Insecurity:   . Worried About Programme researcher, broadcasting/film/video in the Last Year: Not on file  . Ran Out of Food in the Last Year: Not on file  Transportation Needs:   . Lack  of Transportation (Medical): Not on file  . Lack of Transportation (Non-Medical): Not on file  Physical Activity:   . Days of Exercise per Week: Not on file  . Minutes of Exercise per Session: Not on file  Stress:   . Feeling of Stress : Not on file  Social Connections:   . Frequency of Communication with Friends and Family: Not on file  . Frequency of Social Gatherings with Friends and Family: Not on file  . Attends Religious Services: Not on file  . Active Member of Clubs or Organizations: Not on file  . Attends Banker Meetings: Not on file  . Marital Status: Not on file  Intimate Partner Violence:   . Fear of Current or Ex-Partner: Not on file  . Emotionally Abused: Not on file  . Physically Abused: Not on file  . Sexually Abused: Not on file    Objective:   Today's Vitals: BP 131/82   Pulse (!) 56   Temp 97.8 F (36.6 C) (Temporal)   Ht 5\' 1"  (1.549 m)   Wt 228 lb 3.2 oz (103.5 kg)   SpO2 95%   BMI 43.12 kg/m   Physical Exam Vitals reviewed.  Constitutional:      General: She is not in acute distress.    Appearance: Normal appearance. She is morbidly obese. She is not ill-appearing, toxic-appearing or diaphoretic.  HENT:     Head: Normocephalic and atraumatic.  Eyes:     General: No scleral icterus.       Right eye: No discharge.        Left eye: No discharge.     Conjunctiva/sclera: Conjunctivae normal.  Cardiovascular:     Rate and Rhythm: Normal rate and regular rhythm.     Heart sounds: Normal heart sounds. No murmur heard.  No friction rub. No gallop.   Pulmonary:     Effort: Pulmonary effort is normal. No respiratory distress.     Breath sounds: Normal breath sounds. No stridor. No wheezing, rhonchi or rales.  Musculoskeletal:        General: Normal range of motion.     Cervical back: Normal range of motion.  Skin:    General: Skin is warm and dry.     Capillary Refill: Capillary refill takes less than 2 seconds.  Neurological:      General: No focal deficit present.     Mental Status: She is alert and oriented to person, place, and time. Mental status is at baseline.  Psychiatric:        Mood and Affect: Mood normal.        Behavior: Behavior normal.        Thought Content: Thought content normal.        Judgment: Judgment normal.    Diabetic Foot Exam - Simple   Simple Foot Form Diabetic Foot exam was performed with the following findings: Yes 09/09/2019  1:15 PM  Visual Inspection No deformities, no ulcerations, no other skin breakdown bilaterally: Yes Sensation Testing See comments: Yes Pulse Check Posterior Tibialis and Dorsalis pulse intact bilaterally: Yes Comments Patient only felt monofilament on great toe of right foot. She could feel the monofilament on the left foot everywhere except the heel.

## 2019-09-24 ENCOUNTER — Other Ambulatory Visit: Payer: Self-pay | Admitting: *Deleted

## 2019-09-24 NOTE — Telephone Encounter (Signed)
Fax from [facility] Re: Humalog Kwikpen 100u/ml Note from pharmacy: Not covered by ins, please change to Novolog Flex  RF request for Lantus also

## 2019-09-28 MED ORDER — NOVOLOG FLEXPEN 100 UNIT/ML ~~LOC~~ SOPN: 15.0000 [IU] | PEN_INJECTOR | Freq: Three times a day (TID) | SUBCUTANEOUS | 2 refills | Status: DC

## 2019-09-28 MED ORDER — INSULIN GLARGINE 100 UNIT/ML SOLOSTAR PEN: 60.0000 [IU] | PEN_INJECTOR | Freq: Every day | SUBCUTANEOUS | 1 refills | Status: DC

## 2019-09-29 ENCOUNTER — Telehealth: Payer: Self-pay | Admitting: Family Medicine

## 2019-09-29 MED ORDER — INSULIN GLARGINE 100 UNIT/ML SOLOSTAR PEN: 60.0000 [IU] | PEN_INJECTOR | Freq: Every day | SUBCUTANEOUS | 1 refills | Status: DC

## 2019-09-29 NOTE — Telephone Encounter (Signed)
Please let patient know:  Called [facility] pharmacy They did not receive lantus RX--resent, there's a copay card available online, as low are $0 copay; patient must fill out  Novolog (meal time insulin) was covered on insurance for $90/for 23 day supply w  Patient has a high deductible plan  She can call her insurance to see if anything is cheaper

## 2019-09-29 NOTE — Telephone Encounter (Signed)
lmtcb

## 2019-09-29 NOTE — Telephone Encounter (Signed)
Which one? I just changed the prescription yesterday as requested.

## 2019-10-02 ENCOUNTER — Telehealth: Payer: Self-pay

## 2019-10-02 MED ORDER — NOVOLIN 70/30 (70-30) 100 UNIT/ML ~~LOC~~ SUSP: 30.0000 [IU] | Freq: Two times a day (BID) | SUBCUTANEOUS | 2 refills | Status: DC

## 2019-10-02 NOTE — Telephone Encounter (Signed)
Pt called stating that she spoke with someone from [facility] who said that her insurance is not covering her Lantus Insulin Rx and pt cannot afford to pay for it. Needs other options. Please call.

## 2019-10-02 NOTE — Telephone Encounter (Signed)
Novolog and Lantus D/C'd. Novolin 70/30 30 units BID prescribed. Please make sure patient is aware of changes.

## 2019-10-02 NOTE — Telephone Encounter (Signed)
Patient aware and verbalizes understanding.  States that she did try to use the co pay card and when she talked to her insurance they told her that Novolin 70/30 would be covered.

## 2019-10-02 NOTE — Telephone Encounter (Signed)
lmtcb

## 2019-10-02 NOTE — Telephone Encounter (Signed)
Please let patient know:  -Patient has a high deductible plan--this means any insulin will be pricey.  Did she try to use copay card as previously suggested?  -She needs to call her insurance to see if anything is cheaper--we have already tried several types of insulin  We may have to do Novolin 70/30 BID (availble at [facility] for $30/vial)

## 2019-10-08 NOTE — Telephone Encounter (Signed)
Patient aware.

## 2019-11-30 ENCOUNTER — Telehealth: Payer: Self-pay

## 2019-11-30 NOTE — Telephone Encounter (Signed)
Pt called requesting refill order on her blood glucose test strips (Relion). Pt wants order sent to [facility] pharmacy in [place].

## 2019-12-01 MED ORDER — GLUCOSE BLOOD VI STRP: ORAL_STRIP | 3 refills | Status: DC

## 2019-12-01 NOTE — Telephone Encounter (Signed)
Pt aware refill sent to [facility] 

## 2019-12-03 ENCOUNTER — Encounter: Payer: Self-pay | Admitting: *Deleted

## 2019-12-10 ENCOUNTER — Ambulatory Visit: Payer: 59 | Admitting: Family

## 2019-12-18 ENCOUNTER — Ambulatory Visit (INDEPENDENT_AMBULATORY_CARE_PROVIDER_SITE_OTHER): Payer: 59 | Admitting: Family

## 2019-12-18 ENCOUNTER — Encounter: Payer: Self-pay | Admitting: Family

## 2019-12-18 ENCOUNTER — Other Ambulatory Visit: Payer: Self-pay

## 2019-12-18 VITALS — BP 150/78 | HR 65 | Temp 97.2°F | Ht 61.0 in | Wt 222.2 lb

## 2019-12-18 DIAGNOSIS — K219 Gastro-esophageal reflux disease without esophagitis: Secondary | ICD-10-CM

## 2019-12-18 DIAGNOSIS — E1159 Type 2 diabetes mellitus with other circulatory complications: Secondary | ICD-10-CM

## 2019-12-18 DIAGNOSIS — G4700 Insomnia, unspecified: Secondary | ICD-10-CM | POA: Insufficient documentation

## 2019-12-18 DIAGNOSIS — E1165 Type 2 diabetes mellitus with hyperglycemia: Secondary | ICD-10-CM

## 2019-12-18 DIAGNOSIS — I2510 Atherosclerotic heart disease of native coronary artery without angina pectoris: Secondary | ICD-10-CM | POA: Diagnosis not present

## 2019-12-18 DIAGNOSIS — I10 Essential (primary) hypertension: Secondary | ICD-10-CM | POA: Diagnosis not present

## 2019-12-18 DIAGNOSIS — E782 Mixed hyperlipidemia: Secondary | ICD-10-CM

## 2019-12-18 LAB — CMP14+EGFR
ALT: 20 IU/L (ref 0–32)
AST: 18 IU/L (ref 0–40)
Albumin/Globulin Ratio: 1.6 (ref 1.2–2.2)
Albumin: 4.2 g/dL (ref 3.8–4.8)
Alkaline Phosphatase: 95 IU/L (ref 44–121)
BUN/Creatinine Ratio: 16 (ref 12–28)
BUN: 15 mg/dL (ref 8–27)
Bilirubin Total: 0.3 mg/dL (ref 0.0–1.2)
CO2: 20 mmol/L (ref 20–29)
Calcium: 9.1 mg/dL (ref 8.7–10.3)
Chloride: 97 mmol/L (ref 96–106)
Creatinine, Ser: 0.94 mg/dL (ref 0.57–1.00)
GFR calc Af Amer: 75 mL/min/{1.73_m2} (ref >59)
GFR calc non Af Amer: 65 mL/min/{1.73_m2} (ref >59)
Globulin, Total: 2.6 g/dL (ref 1.5–4.5)
Glucose: 327 mg/dL — ABNORMAL HIGH (ref 65–99)
Potassium: 4 mmol/L (ref 3.5–5.2)
Sodium: 136 mmol/L (ref 134–144)
Total Protein: 6.8 g/dL (ref 6.0–8.5)

## 2019-12-18 LAB — CBC WITH DIFFERENTIAL/PLATELET
Basophils Absolute: 0.1 10*3/uL (ref 0.0–0.2)
Basos: 1 %
EOS (ABSOLUTE): 0.3 10*3/uL (ref 0.0–0.4)
Eos: 3 %
Hematocrit: 38.8 % (ref 34.0–46.6)
Hemoglobin: 12.6 g/dL (ref 11.1–15.9)
Immature Grans (Abs): 0 10*3/uL (ref 0.0–0.1)
Immature Granulocytes: 0 %
Lymphocytes Absolute: 2.2 10*3/uL (ref 0.7–3.1)
Lymphs: 25 %
MCH: 27.3 pg (ref 26.6–33.0)
MCHC: 32.5 g/dL (ref 31.5–35.7)
MCV: 84 fL (ref 79–97)
Monocytes Absolute: 0.5 10*3/uL (ref 0.1–0.9)
Monocytes: 6 %
Neutrophils Absolute: 5.7 10*3/uL (ref 1.4–7.0)
Neutrophils: 65 %
Platelets: 288 10*3/uL (ref 150–450)
RBC: 4.61 x10E6/uL (ref 3.77–5.28)
RDW: 14 % (ref 11.7–15.4)
WBC: 8.9 10*3/uL (ref 3.4–10.8)

## 2019-12-18 LAB — BAYER DCA HB A1C WAIVED: HB A1C (BAYER DCA - WAIVED): 8.2 % — ABNORMAL HIGH (ref <7.0)

## 2019-12-18 MED ORDER — ROSUVASTATIN CALCIUM 10 MG PO TABS: 10.0000 mg | ORAL_TABLET | Freq: Every day | ORAL | 3 refills | Status: DC

## 2019-12-18 NOTE — Patient Instructions (Addendum)
Diabetes Mellitus and Nutrition, Adult When you have diabetes (diabetes mellitus), it is very important to have healthy eating habits because your blood sugar (glucose) levels are greatly affected by what you eat and drink. Eating healthy foods in the appropriate amounts, at about the same times every day, can help you:  Control your blood glucose.  Lower your risk of heart disease.  Improve your blood pressure.  Reach or maintain a healthy weight. Every person with diabetes is different, and each person has different needs for a meal plan. Your health care provider may recommend that you work with a diet and nutrition specialist (dietitian) to make a meal plan that is best for you. Your meal plan may vary depending on factors such as:  The calories you need.  The medicines you take.  Your weight.  Your blood glucose, blood pressure, and cholesterol levels.  Your activity level.  Other health conditions you have, such as heart or kidney disease. How do carbohydrates affect me? Carbohydrates, also called carbs, affect your blood glucose level more than any other type of food. Eating carbs naturally raises the amount of glucose in your blood. Carb counting is a method for keeping track of how many carbs you eat. Counting carbs is important to keep your blood glucose at a healthy level, especially if you use insulin or take certain oral diabetes medicines. It is important to know how many carbs you can safely have in each meal. This is different for every person. Your dietitian can help you calculate how many carbs you should have at each meal and for each snack. Foods that contain carbs include:  Bread, cereal, rice, pasta, and crackers.  Potatoes and corn.  Peas, beans, and lentils.  Milk and yogurt.  Fruit and juice.  Desserts, such as cakes, cookies, ice cream, and candy. How does alcohol affect me? Alcohol can cause a sudden decrease in blood glucose (hypoglycemia),  especially if you use insulin or take certain oral diabetes medicines. Hypoglycemia can be a life-threatening condition. Symptoms of hypoglycemia (sleepiness, dizziness, and confusion) are similar to symptoms of having too much alcohol. If your health care provider says that alcohol is safe for you, follow these guidelines:  Limit alcohol intake to no more than 1 drink per day for nonpregnant women and 2 drinks per day for men. One drink equals 12 oz of beer, 5 oz of wine, or 1 oz of hard liquor.  Do not drink on an empty stomach.  Keep yourself hydrated with water, diet soda, or unsweetened iced tea.  Keep in mind that regular soda, juice, and other mixers may contain a lot of sugar and must be counted as carbs. What are tips for following this plan?  Reading food labels  Start by checking the serving size on the "Nutrition Facts" label of packaged foods and drinks. The amount of calories, carbs, fats, and other nutrients listed on the label is based on one serving of the item. Many items contain more than one serving per package.  Check the total grams (g) of carbs in one serving. You can calculate the number of servings of carbs in one serving by dividing the total carbs by 15. For example, if a food has 30 g of total carbs, it would be equal to 2 servings of carbs.  Check the number of grams (g) of saturated and trans fats in one serving. Choose foods that have low or no amount of these fats.  Check the number of   milligrams (mg) of salt (sodium) in one serving. Most people should limit total sodium intake to less than 2,300 mg per day.  Always check the nutrition information of foods labeled as "low-fat" or "nonfat". These foods may be higher in added sugar or refined carbs and should be avoided.  Talk to your dietitian to identify your daily goals for nutrients listed on the label. Shopping  Avoid buying canned, premade, or processed foods. These foods tend to be high in fat, sodium,  and added sugar.  Shop around the outside edge of the grocery store. This includes fresh fruits and vegetables, bulk grains, fresh meats, and fresh dairy. Cooking  Use low-heat cooking methods, such as baking, instead of high-heat cooking methods like deep frying.  Cook using healthy oils, such as olive, canola, or sunflower oil.  Avoid cooking with butter, cream, or high-fat meats. Meal planning  Eat meals and snacks regularly, preferably at the same times every day. Avoid going long periods of time without eating.  Eat foods high in fiber, such as fresh fruits, vegetables, beans, and whole grains. Talk to your dietitian about how many servings of carbs you can eat at each meal.  Eat 4-6 ounces (oz) of lean protein each day, such as lean meat, chicken, fish, eggs, or tofu. One oz of lean protein is equal to: ? 1 oz of meat, chicken, or fish. ? 1 egg. ?  cup of tofu.  Eat some foods each day that contain healthy fats, such as avocado, nuts, seeds, and fish. Lifestyle  Check your blood glucose regularly.  Exercise regularly as told by your health care provider. This may include: ? 150 minutes of moderate-intensity or vigorous-intensity exercise each week. This could be brisk walking, biking, or water aerobics. ? Stretching and doing strength exercises, such as yoga or weightlifting, at least 2 times a week.  Take medicines as told by your health care provider.  Do not use any products that contain nicotine or tobacco, such as cigarettes and e-cigarettes. If you need help quitting, ask your health care provider.  Work with a counselor or diabetes educator to identify strategies to manage stress and any emotional and social challenges. Questions to ask a health care provider  Do I need to meet with a diabetes educator?  Do I need to meet with a dietitian?  What number can I call if I have questions?  When are the best times to check my blood glucose? Where to find more  information:  American Diabetes Association: diabetes.org  Academy of Nutrition and Dietetics: www.eatright.org  National Institute of Diabetes and Digestive and Kidney Diseases (NIH): www.niddk.nih.gov Summary  A healthy meal plan will help you control your blood glucose and maintain a healthy lifestyle.  Working with a diet and nutrition specialist (dietitian) can help you make a meal plan that is best for you.  Keep in mind that carbohydrates (carbs) and alcohol have immediate effects on your blood glucose levels. It is important to count carbs and to use alcohol carefully. This information is not intended to replace advice given to you by your health care provider. Make sure you discuss any questions you have with your health care provider. Document Revised: 11/30/2016 Document Reviewed: 01/23/2016 Elsevier Patient Education  2020 Elsevier Inc. Hypertension, Adult High blood pressure (hypertension) is when the force of blood pumping through the arteries is too strong. The arteries are the blood vessels that carry blood from the heart throughout the body. Hypertension forces the heart to   work harder to pump blood and may cause arteries to become narrow or stiff. Untreated or uncontrolled hypertension can cause a heart attack, heart failure, a stroke, kidney disease, and other problems. A blood pressure reading consists of a higher number over a lower number. Ideally, your blood pressure should be below 120/80. The first ("top") number is called the systolic pressure. It is a measure of the pressure in your arteries as your heart beats. The second ("bottom") number is called the diastolic pressure. It is a measure of the pressure in your arteries as the heart relaxes. What are the causes? The exact cause of this condition is not known. There are some conditions that result in or are related to high blood pressure. What increases the risk? Some risk factors for high blood pressure are under  your control. The following factors may make you more likely to develop this condition:  Smoking.  Having type 2 diabetes mellitus, high cholesterol, or both.  Not getting enough exercise or physical activity.  Being overweight.  Having too much fat, sugar, calories, or salt (sodium) in your diet.  Drinking too much alcohol. Some risk factors for high blood pressure may be difficult or impossible to change. Some of these factors include:  Having chronic kidney disease.  Having a family history of high blood pressure.  Age. Risk increases with age.  Race. You may be at higher risk if you are African American.  Gender. Men are at higher risk than women before age 45. After age 65, women are at higher risk than men.  Having obstructive sleep apnea.  Stress. What are the signs or symptoms? High blood pressure may not cause symptoms. Very high blood pressure (hypertensive crisis) may cause:  Headache.  Anxiety.  Shortness of breath.  Nosebleed.  Nausea and vomiting.  Vision changes.  Severe chest pain.  Seizures. How is this diagnosed? This condition is diagnosed by measuring your blood pressure while you are seated, with your arm resting on a flat surface, your legs uncrossed, and your feet flat on the floor. The cuff of the blood pressure monitor will be placed directly against the skin of your upper arm at the level of your heart. It should be measured at least twice using the same arm. Certain conditions can cause a difference in blood pressure between your right and left arms. Certain factors can cause blood pressure readings to be lower or higher than normal for a short period of time:  When your blood pressure is higher when you are in a health care provider's office than when you are at home, this is called white coat hypertension. Most people with this condition do not need medicines.  When your blood pressure is higher at home than when you are in a health  care provider's office, this is called masked hypertension. Most people with this condition may need medicines to control blood pressure. If you have a high blood pressure reading during one visit or you have normal blood pressure with other risk factors, you may be asked to:  Return on a different day to have your blood pressure checked again.  Monitor your blood pressure at home for 1 week or longer. If you are diagnosed with hypertension, you may have other blood or imaging tests to help your health care provider understand your overall risk for other conditions. How is this treated? This condition is treated by making healthy lifestyle changes, such as eating healthy foods, exercising more, and reducing your   alcohol intake. Your health care provider may prescribe medicine if lifestyle changes are not enough to get your blood pressure under control, and if:  Your systolic blood pressure is above 130.  Your diastolic blood pressure is above 80. Your personal target blood pressure may vary depending on your medical conditions, your age, and other factors. Follow these instructions at home: Eating and drinking   Eat a diet that is high in fiber and potassium, and low in sodium, added sugar, and fat. An example eating plan is called the DASH (Dietary Approaches to Stop Hypertension) diet. To eat this way: ? Eat plenty of fresh fruits and vegetables. Try to fill one half of your plate at each meal with fruits and vegetables. ? Eat whole grains, such as whole-wheat pasta, brown rice, or whole-grain bread. Fill about one fourth of your plate with whole grains. ? Eat or drink low-fat dairy products, such as skim milk or low-fat yogurt. ? Avoid fatty cuts of meat, processed or cured meats, and poultry with skin. Fill about one fourth of your plate with lean proteins, such as fish, chicken without skin, beans, eggs, or tofu. ? Avoid pre-made and processed foods. These tend to be higher in sodium,  added sugar, and fat.  Reduce your daily sodium intake. Most people with hypertension should eat less than 1,500 mg of sodium a day.  Do not drink alcohol if: ? Your health care provider tells you not to drink. ? You are pregnant, may be pregnant, or are planning to become pregnant.  If you drink alcohol: ? Limit how much you use to:  0-1 drink a day for women.  0-2 drinks a day for men. ? Be aware of how much alcohol is in your drink. In the [place] one drink equals one 12 oz bottle of beer (355 mL), one 5 oz glass of wine (148 mL), or one 1 oz glass of hard liquor (44 mL). Lifestyle   Work with your health care provider to maintain a healthy body weight or to lose weight. Ask what an ideal weight is for you.  Get at least 30 minutes of exercise most days of the week. Activities may include walking, swimming, or biking.  Include exercise to strengthen your muscles (resistance exercise), such as Pilates or lifting weights, as part of your weekly exercise routine. Try to do these types of exercises for 30 minutes at least 3 days a week.  Do not use any products that contain nicotine or tobacco, such as cigarettes, e-cigarettes, and chewing tobacco. If you need help quitting, ask your health care provider.  Monitor your blood pressure at home as told by your health care provider.  Keep all follow-up visits as told by your health care provider. This is important. Medicines  Take over-the-counter and prescription medicines only as told by your health care provider. Follow directions carefully. Blood pressure medicines must be taken as prescribed.  Do not skip doses of blood pressure medicine. Doing this puts you at risk for problems and can make the medicine less effective.  Ask your health care provider about side effects or reactions to medicines that you should watch for. Contact a health care provider if you:  Think you are having a reaction to a medicine you are taking.  Have  headaches that keep coming back (recurring).  Feel dizzy.  Have swelling in your ankles.  Have trouble with your vision. Get help right away if you:  Develop a severe headache or confusion.    Have unusual weakness or numbness.  Feel faint.  Have severe pain in your chest or abdomen.  Vomit repeatedly.  Have trouble breathing. Summary  Hypertension is when the force of blood pumping through your arteries is too strong. If this condition is not controlled, it may put you at risk for serious complications.  Your personal target blood pressure may vary depending on your medical conditions, your age, and other factors. For most people, a normal blood pressure is less than 120/80.  Hypertension is treated with lifestyle changes, medicines, or a combination of both. Lifestyle changes include losing weight, eating a healthy, low-sodium diet, exercising more, and limiting alcohol. This information is not intended to replace advice given to you by your health care provider. Make sure you discuss any questions you have with your health care provider. Document Revised: 08/28/2017 Document Reviewed: 08/28/2017 Elsevier Patient Education  2020 Elsevier Inc.  

## 2019-12-18 NOTE — Progress Notes (Signed)
Subjective:    Patient ID: Sara Stuart, female    DOB: 09/02/1955, 64 y.o.   MRN: [id number]  Chief Complaint  Patient presents with  . Medical Management of Chronic Issues  . Diabetes   PT presents to the office today for chronic follow up. She is followed by Cardiologists for CAD.  Diabetes She presents for her follow-up diabetic visit. She has type 2 diabetes mellitus. Her disease course has been stable. There are no hypoglycemic associated symptoms. Pertinent negatives for diabetes include no blurred vision and no foot paresthesias. Symptoms are stable. Diabetic complications include heart disease. Pertinent negatives for diabetic complications include no nephropathy. Risk factors for coronary artery disease include dyslipidemia, diabetes mellitus, hypertension, sedentary lifestyle and post-menopausal. She is following a generally unhealthy diet. Her overall blood glucose range is 180-200 mg/dl. An ACE inhibitor/angiotensin II receptor blocker is being taken.  Hypertension This is a chronic problem. The current episode started more than 1 year ago. The problem has been waxing and waning since onset. The problem is uncontrolled. Associated symptoms include malaise/fatigue and peripheral edema. Pertinent negatives include no blurred vision or shortness of breath. Risk factors for coronary artery disease include dyslipidemia, diabetes mellitus, obesity and sedentary lifestyle. The current treatment provides moderate improvement.  Hyperlipidemia This is a chronic problem. The current episode started more than 1 year ago. Exacerbating diseases include obesity. Pertinent negatives include no shortness of breath. Current antihyperlipidemic treatment includes statins. The current treatment provides moderate improvement of lipids. Risk factors for coronary artery disease include dyslipidemia, hypertension, a sedentary lifestyle and post-menopausal.  Gastroesophageal Reflux She complains of belching and  heartburn. This is a chronic problem. The current episode started more than 1 year ago. The problem occurs rarely. The problem has been resolved. Risk factors include obesity. She has tried a PPI for the symptoms. The treatment provided mild relief.  Insomnia Primary symptoms: difficulty falling asleep, frequent awakening, malaise/fatigue.  The current episode started more than one year. The onset quality is gradual. The problem occurs intermittently. The problem has been waxing and waning since onset.      Review of Systems  Constitutional: Positive for malaise/fatigue.  Eyes: Negative for blurred vision.  Respiratory: Negative for shortness of breath.   Gastrointestinal: Positive for heartburn.  Psychiatric/Behavioral: The patient has insomnia.   All other systems reviewed and are negative.      Objective:   Physical Exam Vitals reviewed.  Constitutional:      General: She is not in acute distress.    Appearance: She is well-developed and well-nourished. She is obese.  HENT:     Head: Normocephalic and atraumatic.     Right Ear: Tympanic membrane normal.     Left Ear: Tympanic membrane normal.     Mouth/Throat:     Mouth: Oropharynx is clear and moist.  Eyes:     Pupils: Pupils are equal, round, and reactive to light.  Neck:     Thyroid: No thyromegaly.  Cardiovascular:     Rate and Rhythm: Normal rate and regular rhythm.     Pulses: Intact distal pulses.     Heart sounds: Normal heart sounds. No murmur heard.   Pulmonary:     Effort: Pulmonary effort is normal. No respiratory distress.     Breath sounds: Normal breath sounds. No wheezing.  Abdominal:     General: Bowel sounds are normal. There is no distension.     Palpations: Abdomen is soft.     Tenderness: There is  no abdominal tenderness.  Musculoskeletal:        General: No tenderness or edema. Normal range of motion.     Cervical back: Normal range of motion and neck supple.  Skin:    General: Skin is warm  and dry.  Neurological:     Mental Status: She is alert and oriented to person, place, and time.     Cranial Nerves: No cranial nerve deficit.     Deep Tendon Reflexes: Reflexes are normal and symmetric.  Psychiatric:        Mood and Affect: Mood and affect normal.        Behavior: Behavior normal.        Thought Content: Thought content normal.        Judgment: Judgment normal.       BP (!) 150/78   Pulse 65   Temp (!) 97.2 F (36.2 C) (Temporal)   Ht '5\' 1"'  (1.549 m)   Wt 222 lb 3.2 oz (100.8 kg)   SpO2 95%   BMI 41.98 kg/m      Assessment & Plan:  [name] comes in today with chief complaint of Medical Management of Chronic Issues and Diabetes   Diagnosis and orders addressed:  1. Essential hypertension, benign -Will monitor BP at home RTO in 1 month  - CMP14+EGFR - CBC with Differential/Platelet  2. Coronary artery disease involving native coronary artery of native heart without angina pectoris - CMP14+EGFR - CBC with Differential/Platelet - rosuvastatin (CRESTOR) 10 MG tablet; Take 1 tablet (10 mg total) by mouth daily.  Dispense: 90 tablet; Refill: 3  3. DM type 2 causing vascular disease ([place]) - CMP14+EGFR - CBC with Differential/Platelet - Bayer DCA Hb A1c Waived  4. Gastroesophageal reflux disease without esophagitis - CMP14+EGFR - CBC with Differential/Platelet  5. Uncontrolled type 2 diabetes mellitus with hyperglycemia ([facility]) - CMP14+EGFR - CBC with Differential/Platelet  6. Mixed hyperlipidemia - CMP14+EGFR - CBC with Differential/Platelet - rosuvastatin (CRESTOR) 10 MG tablet; Take 1 tablet (10 mg total) by mouth daily.  Dispense: 90 tablet; Refill: 3  7. Morbid obesity ([place]) - CMP14+EGFR - CBC with Differential/Platelet  8. Insomnia, unspecified type    Labs pending Long discussion on all medications. We went through each medication and what they are for.  Health Maintenance reviewed Diet and exercise encouraged  Follow up  plan: 1 month to recheck HTN and needs appt with Almyra Free for DM education.    Evelina Dun, FNP

## 2019-12-21 ENCOUNTER — Other Ambulatory Visit: Payer: Self-pay | Admitting: Family

## 2019-12-21 MED ORDER — EMPAGLIFLOZIN 10 MG PO TABS: 10.0000 mg | ORAL_TABLET | Freq: Every day | ORAL | 1 refills | Status: DC

## 2019-12-31 ENCOUNTER — Ambulatory Visit (INDEPENDENT_AMBULATORY_CARE_PROVIDER_SITE_OTHER): Payer: 59 | Admitting: Pharmacist

## 2019-12-31 ENCOUNTER — Other Ambulatory Visit: Payer: Self-pay

## 2019-12-31 ENCOUNTER — Other Ambulatory Visit: Payer: Self-pay | Admitting: Family

## 2019-12-31 ENCOUNTER — Encounter: Payer: Self-pay | Admitting: Pharmacist

## 2019-12-31 DIAGNOSIS — E119 Type 2 diabetes mellitus without complications: Secondary | ICD-10-CM

## 2019-12-31 MED ORDER — DAPAGLIFLOZIN PROPANEDIOL 10 MG PO TABS: 10.0000 mg | ORAL_TABLET | Freq: Every day | ORAL | 3 refills | Status: DC

## 2019-12-31 MED ORDER — LANTUS SOLOSTAR 100 UNIT/ML ~~LOC~~ SOPN: 30.0000 [IU] | PEN_INJECTOR | Freq: Two times a day (BID) | SUBCUTANEOUS | 11 refills | Status: DC

## 2019-12-31 NOTE — Progress Notes (Signed)
    12/31/2019 Name: Sara Stuart MRN: [id number] DOB: 23-Dec-1955   S:  64 yoF Presents for diabetes evaluation, education, and management Patient was referred and last seen by Primary Care Provider on 12/18/19.  Insurance coverage/medication affordability: [employer]  Patient reports adherence with medications. . Current diabetes medications include: novolin 70/30, metformin . Current hypertension medications include: dilt, metop, lisinopril, triam/hctz, spiro Goal 130/80 . Current hyperlipidemia medications include: rosuvastatin  Patient denies hypoglycemic events.   Patient reported dietary habits: Eats 2 meals/day Discussed meal planning options and Plate method for healthy eating . Avoid sugary drinks and desserts . Incorporate balanced protein, non starchy veggies, 1 serving of carbohydrate with each meal . Increase water intake . Increase physical activity as able  Patient-reported exercise habits: n/a  O:  Lab Results  Component Value Date   HGBA1C 8.2 (H) 12/18/2019    Lipid Panel     Component Value Date/Time   CHOL 197 05/02/2017 1617   TRIG 217 (H) 05/02/2017 1617   HDL 52 05/02/2017 1617   CHOLHDL 3.8 05/02/2017 1617   LDLCALC 111 (H) 05/02/2017 1617   Home fasting blood sugars: 200-400  2 hour post-meal/random blood sugars: n/a.   Clinical Atherosclerotic Cardiovascular Disease (ASCVD): No   The 10-year ASCVD risk score Denman George DC Jr., et al., 2013) is: 12.5%   Values used to calculate the score:     Age: 64 years     Sex: Female     Is Non-Hispanic African American: No     Diabetic: Yes     Tobacco smoker: No     Systolic Blood Pressure: 150 mmHg     Is BP treated: Yes     HDL Cholesterol: 51 mg/dL     Total Cholesterol: 133 mg/dL    A/P:  Diabetes N8MV currently uncontrolled.  Patient is adherent with medication. Control is suboptimal due to cost/insurance  -Increase Novolin 70/30 to 30 units in the AM and 32-34 units in the PM before  dinner.  Patient states she runs higher in the evening time.  She was better controlled on Lantus, but has had insurance difficulties.  Will attempt to get basal covered  -Start Farxiga 10mg  daily, GFR60, encouraged increased hydration (water)  -Counseled Sick day rules: if sick, vomiting, have diarrhea, or  cannot drink enough fluids, you should stop taking SGLT-2 inhibitors until your symptoms go away. In rare cases, these medicines can cause diabetic ketoacidosis (DKA). DKA is acid buildup in the blood.  -Free 30 day card given  -$0copay card given, f/u on insurance coverage   -Patient did not tolerate Trulicity due to GI distress  -Extensively discussed pathophysiology of diabetes, recommended lifestyle interventions, dietary effects on blood sugar control  -Counseled on s/sx of and management of hypoglycemia  -Next A1C anticipated 01/21/20  Written patient instructions provided.  Total time in face to face counseling 25 minutes.   Follow up PCP Clinic Visit ON 01/21/20.  Follow up PharmD on 01/11/20   03/10/20, PharmD, BCPS Clinical Pharmacist, [facility]  II Phone [phone]

## 2020-01-07 ENCOUNTER — Telehealth: Payer: Self-pay | Admitting: Family Medicine

## 2020-01-07 NOTE — Telephone Encounter (Signed)
lantus copay card left up front for patient Also called in to [facility] but patient needs to present new insurance card to pharmacy

## 2020-01-07 NOTE — Telephone Encounter (Signed)
Pt wanted to inform Raynelle Fanning that the prescription was $100 and she is not able to pay for it at this time

## 2020-01-11 ENCOUNTER — Ambulatory Visit: Payer: 59 | Admitting: Pharmacist

## 2020-01-21 ENCOUNTER — Ambulatory Visit (INDEPENDENT_AMBULATORY_CARE_PROVIDER_SITE_OTHER): Payer: 59 | Admitting: Family Medicine

## 2020-01-21 ENCOUNTER — Encounter: Payer: Self-pay | Admitting: Family Medicine

## 2020-01-21 ENCOUNTER — Other Ambulatory Visit: Payer: Self-pay

## 2020-01-21 VITALS — BP 120/79 | HR 71 | Temp 97.0°F | Ht 61.0 in | Wt 217.4 lb

## 2020-01-21 DIAGNOSIS — E1165 Type 2 diabetes mellitus with hyperglycemia: Secondary | ICD-10-CM | POA: Diagnosis not present

## 2020-01-21 DIAGNOSIS — I10 Essential (primary) hypertension: Secondary | ICD-10-CM | POA: Diagnosis not present

## 2020-01-21 MED ORDER — JANUMET XR 50-1000 MG PO TB24: 1.0000 | ORAL_TABLET | Freq: Two times a day (BID) | ORAL | 2 refills | Status: DC

## 2020-01-21 NOTE — Addendum Note (Signed)
Addended by: Gwenlyn Fudge on: 01/21/2020 03:08 PM   Modules accepted: Orders

## 2020-01-21 NOTE — Progress Notes (Addendum)
Assessment & Plan:  1. Essential hypertension, benign - Well controlled on current regimen.  Education provided on the DASH diet.  Encouraged exercise and to continue low-salt.  2. Uncontrolled type 2 diabetes mellitus with hyperglycemia (HCC) - Working with clinical pharmacist on diabetic regimen.  Patient is to call when her insurance company sends her a list of what they prefer.  Addendum: Janumet XR sent to pharmacy to see about cost.  Patient will follow-up with clinical pharmacist tomorrow.  Return in about 3 months (around 04/20/2020) for DM.  Sara Boston, MSN, APRN, FNP-C [facility]  Subjective:    Patient ID: Sara Stuart, female    DOB: [birth year], 65 y.o.   MRN: [id number]  Patient Care Team: Gwenlyn Fudge, FNP as PCP - General (Family Medicine)   Chief Complaint:  Chief Complaint  Patient presents with  . Hypertension    1 month follow up     HPI: Sara Stuart is a 65 y.o. female presenting on 01/21/2020 for Hypertension (1 month follow up/)  Hypertension: When patient was seen a month ago her blood pressure was elevated.  She was encouraged to keep a log of her blood pressure at home which she has been doing twice daily.  Her systolic ranges 110-155 with 12/60 readings 140-149 and 4/60 > 150.  She has cut salt out of her diet.  New complaints: Patient reports she has been unable to get Lantus from the pharmacy due to cost even with the co-pay card that was given to her by the clinical pharmacist.  She states when she went down there they told her it was going to be $500.  She is currently using Novolin 70/30 30 units 3 times a day in addition to [place] 10 mg once daily and metformin 1000 mg twice daily.   Social history:  Relevant past medical, surgical, family and social history reviewed and updated as indicated. Interim medical history since our last visit reviewed.  Allergies and medications reviewed and updated.  DATA REVIEWED:  CHART IN EPIC  ROS: Negative unless specifically indicated above in HPI.    Current Outpatient Medications:  .  aspirin EC 81 MG tablet, Take 1 tablet (81 mg total) by mouth daily., Disp: 30 tablet, Rfl:  .  baclofen (LIORESAL) 10 MG tablet, Take 10 mg by mouth 3 (three) times daily as needed., Disp: , Rfl:  .  dapagliflozin propanediol (FARXIGA) 10 MG TABS tablet, Take 1 tablet (10 mg total) by mouth daily before breakfast., Disp: 30 tablet, Rfl: 3 .  diltiazem (DILACOR XR) 120 MG 24 hr capsule, Take 1 capsule (120 mg total) by mouth daily., Disp: 90 capsule, Rfl: 1 .  fluticasone (FLONASE) 50 MCG/ACT nasal spray, Place 2 sprays into both nostrils daily as needed., Disp: , Rfl:  .  glucose blood test strip, Test BS four times daily Dx E11.65, Disp: 400 each, Rfl: 3 .  ibuprofen (ADVIL,MOTRIN) 800 MG tablet, Take 800 mg by mouth every 8 (eight) hours as needed for moderate pain., Disp: , Rfl:  .  insulin glargine (LANTUS SOLOSTAR) 100 UNIT/ML Solostar Pen, Inject 30 Units into the skin 2 (two) times daily., Disp: 15 mL, Rfl: 11 .  insulin NPH-regular Human (NOVOLIN 70/30) (70-30) 100 UNIT/ML injection, Inject 30 Units into the skin 2 (two) times daily with a meal., Disp: 20 mL, Rfl: 2 .  Insulin Pen Needle (B-D ULTRAFINE III SHORT PEN) 31G X 8 MM MISC, 1 each by Does not apply  route as directed., Disp: 100 each, Rfl: 3 .  lisinopril (ZESTRIL) 2.5 MG tablet, Take 1 tablet (2.5 mg total) by mouth daily., Disp: 90 tablet, Rfl: 1 .  metFORMIN (GLUCOPHAGE-XR) 500 MG 24 hr tablet, Take 2 tablets (1,000 mg total) by mouth 2 (two) times daily., Disp: 120 tablet, Rfl: 5 .  metoprolol tartrate (LOPRESSOR) 25 MG tablet, Take 25 mg by mouth daily. , Disp: , Rfl:  .  nitroGLYCERIN (NITROSTAT) 0.4 MG SL tablet, Place 1 tablet (0.4 mg total) under the tongue every 5 (five) minutes as needed., Disp: 25 tablet, Rfl: 3 .  omeprazole (PRILOSEC) 40 MG capsule, Take 1 capsule (40 mg total) by mouth daily., Disp: 90  capsule, Rfl: 1 .  rosuvastatin (CRESTOR) 10 MG tablet, Take 1 tablet (10 mg total) by mouth daily., Disp: 90 tablet, Rfl: 3 .  spironolactone (ALDACTONE) 25 MG tablet, Take 0.5 tablets (12.5 mg total) by mouth daily., Disp: 45 tablet, Rfl: 3 .  traZODone (DESYREL) 50 MG tablet, Take 1 tablet (50 mg total) by mouth at bedtime., Disp: 90 tablet, Rfl: 1 .  triamterene-hydrochlorothiazide (DYAZIDE) 37.5-25 MG capsule, Take 1 each (1 capsule total) by mouth daily., Disp: 90 capsule, Rfl: 1   Allergies  Allergen Reactions  . Trulicity [Dulaglutide]     GI intolerance   Past Medical History:  Diagnosis Date  . Arthritis   . Chronic back pain    spondylolishthesis  . DDD (degenerative disc disease), lumbosacral   . Diabetes mellitus without complication (HCC) diagnosed in 02/2014   takes Metformin and Glipizide daily  . GERD (gastroesophageal reflux disease)    takes Omeprazole daily  . Gout    takes Colchicine daily as needed  . Hypertension    takes Dyazide and Diltiazem daily  . Nocturia   . Polio   . PONV (postoperative nausea and vomiting)   . Wears glasses     Past Surgical History:  Procedure Laterality Date  . ABDOMINAL HYSTERECTOMY  2002   TUBAL PREGNANCY  . BACK SURGERY  07/2012  . bladder tacked     . COLONOSCOPY    . FOOT SURGERY     more than 5 surgeries on right foot related to polio  . surgery for polio     states x 5   . TUBAL LIGATION      Social History   Socioeconomic History  . Marital status: Married    Spouse name: Not on file  . Number of children: Not on file  . Years of education: Not on file  . Highest education level: Not on file  Occupational History  . Not on file  Tobacco Use  . Smoking status: Never Smoker  . Smokeless tobacco: Never Used  Vaping Use  . Vaping Use: Never used  Substance and Sexual Activity  . Alcohol use: No  . Drug use: No  . Sexual activity: Yes    Birth control/protection: Surgical  Other Topics Concern  . Not  on file  Social History Narrative  . Not on file   Social Determinants of Health   Financial Resource Strain: Not on file  Food Insecurity: Not on file  Transportation Needs: Not on file  Physical Activity: Not on file  Stress: Not on file  Social Connections: Not on file  Intimate Partner Violence: Not on file        Objective:    BP 120/79   Pulse 71   Temp (!) 97 F (36.1 C) (Temporal)  Ht 5\' 1"  (1.549 m)   Wt 217 lb 6.4 oz (98.6 kg)   SpO2 92%   BMI 41.08 kg/m   Wt Readings from Last 3 Encounters:  01/21/20 217 lb 6.4 oz (98.6 kg)  12/18/19 222 lb 3.2 oz (100.8 kg)  09/09/19 228 lb 3.2 oz (103.5 kg)    Physical Exam Vitals reviewed.  Constitutional:      General: She is not in acute distress.    Appearance: Normal appearance. She is morbidly obese. She is not ill-appearing, toxic-appearing or diaphoretic.  HENT:     Head: Normocephalic and atraumatic.  Eyes:     General: No scleral icterus.       Right eye: No discharge.        Left eye: No discharge.     Conjunctiva/sclera: Conjunctivae normal.  Cardiovascular:     Rate and Rhythm: Normal rate and regular rhythm.     Heart sounds: Normal heart sounds. No murmur heard. No friction rub. No gallop.   Pulmonary:     Effort: Pulmonary effort is normal. No respiratory distress.     Breath sounds: Normal breath sounds. No stridor. No wheezing, rhonchi or rales.  Musculoskeletal:        General: Normal range of motion.     Cervical back: Normal range of motion.  Skin:    General: Skin is warm and dry.     Capillary Refill: Capillary refill takes less than 2 seconds.  Neurological:     General: No focal deficit present.     Mental Status: She is alert and oriented to person, place, and time. Mental status is at baseline.     Gait: Gait abnormal (ambulates with cane).  Psychiatric:        Mood and Affect: Mood normal.        Behavior: Behavior normal.        Thought Content: Thought content normal.         Judgment: Judgment normal.     Lab Results  Component Value Date   TSH 1.41 05/02/2017   Lab Results  Component Value Date   WBC 8.9 12/18/2019   HGB 12.6 12/18/2019   HCT 38.8 12/18/2019   MCV 84 12/18/2019   PLT 288 12/18/2019   Lab Results  Component Value Date   NA 136 12/18/2019   K 4.0 12/18/2019   CO2 20 12/18/2019   GLUCOSE 327 (H) 12/18/2019   BUN 15 12/18/2019   CREATININE 0.94 12/18/2019   BILITOT 0.3 12/18/2019   ALKPHOS 95 12/18/2019   AST 18 12/18/2019   ALT 20 12/18/2019   PROT 6.8 12/18/2019   ALBUMIN 4.2 12/18/2019   CALCIUM 9.1 12/18/2019   ANIONGAP 9 08/24/2019   Lab Results  Component Value Date   CHOL 197 05/02/2017   Lab Results  Component Value Date   HDL 52 05/02/2017   Lab Results  Component Value Date   LDLCALC 111 (H) 05/02/2017   Lab Results  Component Value Date   TRIG 217 (H) 05/02/2017   Lab Results  Component Value Date   CHOLHDL 3.8 05/02/2017   Lab Results  Component Value Date   HGBA1C 8.2 (H) 12/18/2019

## 2020-01-21 NOTE — Patient Instructions (Signed)

## 2020-01-22 ENCOUNTER — Ambulatory Visit: Payer: 59 | Admitting: Pharmacist

## 2020-01-28 ENCOUNTER — Telehealth: Payer: Self-pay | Admitting: *Deleted

## 2020-01-28 DIAGNOSIS — E119 Type 2 diabetes mellitus without complications: Secondary | ICD-10-CM

## 2020-01-28 NOTE — Telephone Encounter (Signed)
The pharmacy claim for Janumet XR 50-1000MG  er tablets has been rejected by insurance  PA started   Key: [id number]  Sent to Plan today

## 2020-02-03 MED ORDER — SITAGLIPTIN PHOSPHATE 100 MG PO TABS: 100.0000 mg | ORAL_TABLET | Freq: Every day | ORAL | 2 refills | Status: DC

## 2020-02-03 NOTE — Telephone Encounter (Signed)
Denied on February 1 This request has not been approved. We were asked to cover a larger amount of the drug listed at the top of this letter than allowed under our quantity limit rule. In order for your request to be approved, your provider would need to show that you have met the guideline rules below.The guideline named GENERAL QUANTITY EXCEPTION CRITERIA (reviewed for JANUMET XR 50-1,000 MG TABLET), if the requested dose is not higher than the FDA (Food and Drug Administration) or drug manufacturer daily maximum recommendations for your age, requires the following criteria (rule) be met for approval:- You have tried and failed the requested strength within the allowable formulary quantity limits and it did not work for you.Your doctor requested a quantity of 2 tablets per day, but your plan allows for a quantity of 1 tablet per day. Your doctor told us that you have a diagnosis of type 2 diabetes (too much sugar in your blood). We do not have information showing you have tried Janumet XR 50-1081m at a quantity of 1 tablet per day and it did not work for you. This is why your request is denied. Please work with your doctor to use a different medication or get [employer] information if it will allow [employer] approve this request. A written notification letter will follow with additional details.

## 2020-02-03 NOTE — Telephone Encounter (Signed)
Pt aware of med changes

## 2020-02-03 NOTE — Telephone Encounter (Signed)
Please make patient aware that insurance is not going to cover Janumet.  She can continue Metformin 1,000 mg twice daily and I sent in a prescription for Januvia 100 mg once daily separately.

## 2020-02-12 ENCOUNTER — Other Ambulatory Visit: Payer: Self-pay

## 2020-02-12 MED ORDER — IBUPROFEN 800 MG PO TABS: 800.0000 mg | ORAL_TABLET | Freq: Three times a day (TID) | ORAL | 1 refills | Status: DC | PRN

## 2020-02-12 NOTE — Telephone Encounter (Signed)
Originally prescribed by previous PCP.  Last OV with [name], FNP 01/21/20

## 2020-02-12 NOTE — Telephone Encounter (Signed)
  Prescription Request  02/12/2020  What is the name of the medication or equipment? IBUPROFEN 800MG    Have you contacted your pharmacy to request a refill? (if applicable) yes, Pt prior PCP to coming here would prescribed this for her  Which pharmacy would you like this sent to? [facility] [place]   Patient notified that their request is being sent to the clinical staff for review and that they should receive a response within 2 business days.

## 2020-02-23 DIAGNOSIS — Z029 Encounter for administrative examinations, unspecified: Secondary | ICD-10-CM

## 2020-03-14 ENCOUNTER — Other Ambulatory Visit: Payer: Self-pay | Admitting: Family Medicine

## 2020-03-16 ENCOUNTER — Other Ambulatory Visit: Payer: Self-pay | Admitting: Family Medicine

## 2020-03-24 ENCOUNTER — Other Ambulatory Visit: Payer: Self-pay | Admitting: Family Medicine

## 2020-04-17 ENCOUNTER — Other Ambulatory Visit: Payer: Self-pay | Admitting: Family Medicine

## 2020-04-20 ENCOUNTER — Ambulatory Visit: Payer: 59 | Admitting: Family Medicine

## 2020-04-29 ENCOUNTER — Ambulatory Visit (INDEPENDENT_AMBULATORY_CARE_PROVIDER_SITE_OTHER): Payer: 59 | Admitting: Family Medicine

## 2020-04-29 ENCOUNTER — Other Ambulatory Visit: Payer: Self-pay

## 2020-04-29 ENCOUNTER — Encounter: Payer: Self-pay | Admitting: Family Medicine

## 2020-04-29 VITALS — BP 122/82 | HR 70 | Temp 97.4°F | Ht 61.0 in | Wt 209.8 lb

## 2020-04-29 DIAGNOSIS — K5792 Diverticulitis of intestine, part unspecified, without perforation or abscess without bleeding: Secondary | ICD-10-CM

## 2020-04-29 DIAGNOSIS — E1165 Type 2 diabetes mellitus with hyperglycemia: Secondary | ICD-10-CM

## 2020-04-29 DIAGNOSIS — I10 Essential (primary) hypertension: Secondary | ICD-10-CM | POA: Diagnosis not present

## 2020-04-29 DIAGNOSIS — E782 Mixed hyperlipidemia: Secondary | ICD-10-CM | POA: Diagnosis not present

## 2020-04-29 LAB — BAYER DCA HB A1C WAIVED: HB A1C (BAYER DCA - WAIVED): 8.4 % — ABNORMAL HIGH (ref <7.0)

## 2020-04-29 MED ORDER — DAPAGLIFLOZIN PROPANEDIOL 10 MG PO TABS
10.0000 mg | ORAL_TABLET | Freq: Every day | ORAL | 1 refills | Status: DC
Start: 2020-04-29 — End: 2020-11-01

## 2020-04-29 MED ORDER — SPIRONOLACTONE 25 MG PO TABS: 12.5000 mg | ORAL_TABLET | Freq: Every day | ORAL | 1 refills | Status: DC

## 2020-04-29 MED ORDER — AMOXICILLIN-POT CLAVULANATE 875-125 MG PO TABS: 1.0000 | ORAL_TABLET | Freq: Two times a day (BID) | ORAL | 0 refills | Status: AC

## 2020-04-29 MED ORDER — METOPROLOL TARTRATE 25 MG PO TABS: 25.0000 mg | ORAL_TABLET | Freq: Every day | ORAL | 1 refills | Status: DC

## 2020-04-29 MED ORDER — TRIAMTERENE-HCTZ 37.5-25 MG PO CAPS: 1.0000 | ORAL_CAPSULE | Freq: Every day | ORAL | 1 refills | Status: DC

## 2020-04-29 MED ORDER — SITAGLIPTIN PHOSPHATE 100 MG PO TABS: 100.0000 mg | ORAL_TABLET | Freq: Every day | ORAL | 1 refills | Status: DC

## 2020-04-29 MED ORDER — NOVOLIN 70/30 (70-30) 100 UNIT/ML ~~LOC~~ SUSP: 30.0000 [IU] | Freq: Two times a day (BID) | SUBCUTANEOUS | 2 refills | Status: DC

## 2020-04-29 NOTE — Patient Instructions (Signed)
Diabetes Mellitus and Nutrition, Adult When you have diabetes, or diabetes mellitus, it is very important to have healthy eating habits because your blood sugar (glucose) levels are greatly affected by what you eat and drink. Eating healthy foods in the right amounts, at about the same times every day, can help you:  Control your blood glucose.  Lower your risk of heart disease.  Improve your blood pressure.  Reach or maintain a healthy weight. What can affect my meal plan? Every person with diabetes is different, and each person has different needs for a meal plan. Your health care provider may recommend that you work with a dietitian to make a meal plan that is best for you. Your meal plan may vary depending on factors such as:  The calories you need.  The medicines you take.  Your weight.  Your blood glucose, blood pressure, and cholesterol levels.  Your activity level.  Other health conditions you have, such as heart or kidney disease. How do carbohydrates affect me? Carbohydrates, also called carbs, affect your blood glucose level more than any other type of food. Eating carbs naturally raises the amount of glucose in your blood. Carb counting is a method for keeping track of how many carbs you eat. Counting carbs is important to keep your blood glucose at a healthy level, especially if you use insulin or take certain oral diabetes medicines. It is important to know how many carbs you can safely have in each meal. This is different for every person. Your dietitian can help you calculate how many carbs you should have at each meal and for each snack. How does alcohol affect me? Alcohol can cause a sudden decrease in blood glucose (hypoglycemia), especially if you use insulin or take certain oral diabetes medicines. Hypoglycemia can be a life-threatening condition. Symptoms of hypoglycemia, such as sleepiness, dizziness, and confusion, are similar to symptoms of having too much  alcohol.  Do not drink alcohol if: ? Your health care provider tells you not to drink. ? You are pregnant, may be pregnant, or are planning to become pregnant.  If you drink alcohol: ? Do not drink on an empty stomach. ? Limit how much you use to:  0-1 drink a day for women.  0-2 drinks a day for men. ? Be aware of how much alcohol is in your drink. In the [place] one drink equals one 12 oz bottle of beer (355 mL), one 5 oz glass of wine (148 mL), or one 1 oz glass of hard liquor (44 mL). ? Keep yourself hydrated with water, diet soda, or unsweetened iced tea.  Keep in mind that regular soda, juice, and other mixers may contain a lot of sugar and must be counted as carbs. What are tips for following this plan? Reading food labels  Start by checking the serving size on the "Nutrition Facts" label of packaged foods and drinks. The amount of calories, carbs, fats, and other nutrients listed on the label is based on one serving of the item. Many items contain more than one serving per package.  Check the total grams (g) of carbs in one serving. You can calculate the number of servings of carbs in one serving by dividing the total carbs by 15. For example, if a food has 30 g of total carbs per serving, it would be equal to 2 servings of carbs.  Check the number of grams (g) of saturated fats and trans fats in one serving. Choose foods that have   a low amount or none of these fats.  Check the number of milligrams (mg) of salt (sodium) in one serving. Most people should limit total sodium intake to less than 2,300 mg per day.  Always check the nutrition information of foods labeled as "low-fat" or "nonfat." These foods may be higher in added sugar or refined carbs and should be avoided.  Talk to your dietitian to identify your daily goals for nutrients listed on the label. Shopping  Avoid buying canned, pre-made, or processed foods. These foods tend to be high in fat, sodium, and added  sugar.  Shop around the outside edge of the grocery store. This is where you will most often find fresh fruits and vegetables, bulk grains, fresh meats, and fresh dairy. Cooking  Use low-heat cooking methods, such as baking, instead of high-heat cooking methods like deep frying.  Cook using healthy oils, such as olive, canola, or sunflower oil.  Avoid cooking with butter, cream, or high-fat meats. Meal planning  Eat meals and snacks regularly, preferably at the same times every day. Avoid going long periods of time without eating.  Eat foods that are high in fiber, such as fresh fruits, vegetables, beans, and whole grains. Talk with your dietitian about how many servings of carbs you can eat at each meal.  Eat 4-6 oz (112-168 g) of lean protein each day, such as lean meat, chicken, fish, eggs, or tofu. One ounce (oz) of lean protein is equal to: ? 1 oz (28 g) of meat, chicken, or fish. ? 1 egg. ?  cup (62 g) of tofu.  Eat some foods each day that contain healthy fats, such as avocado, nuts, seeds, and fish.   What foods should I eat? Fruits Berries. Apples. Oranges. Peaches. Apricots. Plums. Grapes. Mango. Papaya. Pomegranate. Kiwi. Cherries. Vegetables Lettuce. Spinach. Leafy greens, including kale, chard, collard greens, and mustard greens. Beets. Cauliflower. Cabbage. Broccoli. Carrots. Green beans. Tomatoes. Peppers. Onions. Cucumbers. Brussels sprouts. Grains Whole grains, such as whole-wheat or whole-grain bread, crackers, tortillas, cereal, and pasta. Unsweetened oatmeal. Quinoa. Brown or wild rice. Meats and other proteins Seafood. Poultry without skin. Lean cuts of poultry and beef. Tofu. Nuts. Seeds. Dairy Low-fat or fat-free dairy products such as milk, yogurt, and cheese. The items listed above may not be a complete list of foods and beverages you can eat. Contact a dietitian for more information. What foods should I avoid? Fruits Fruits canned with  syrup. Vegetables Canned vegetables. Frozen vegetables with butter or cream sauce. Grains Refined white flour and flour products such as bread, pasta, snack foods, and cereals. Avoid all processed foods. Meats and other proteins Fatty cuts of meat. Poultry with skin. Breaded or fried meats. Processed meat. Avoid saturated fats. Dairy Full-fat yogurt, cheese, or milk. Beverages Sweetened drinks, such as soda or iced tea. The items listed above may not be a complete list of foods and beverages you should avoid. Contact a dietitian for more information. Questions to ask a health care provider  Do I need to meet with a diabetes educator?  Do I need to meet with a dietitian?  What number can I call if I have questions?  When are the best times to check my blood glucose? Where to find more information:  American Diabetes Association: diabetes.org  Academy of Nutrition and Dietetics: www.eatright.org  National Institute of Diabetes and Digestive and Kidney Diseases: www.niddk.nih.gov  Association of Diabetes Care and Education Specialists: www.diabeteseducator.org Summary  It is important to have healthy eating   habits because your blood sugar (glucose) levels are greatly affected by what you eat and drink.  A healthy meal plan will help you control your blood glucose and maintain a healthy lifestyle.  Your health care provider may recommend that you work with a dietitian to make a meal plan that is best for you.  Keep in mind that carbohydrates (carbs) and alcohol have immediate effects on your blood glucose levels. It is important to count carbs and to use alcohol carefully. This information is not intended to replace advice given to you by your health care provider. Make sure you discuss any questions you have with your health care provider. Document Revised: 11/25/2018 Document Reviewed: 11/25/2018 Elsevier Patient Education  2021 [employer]  

## 2020-04-29 NOTE — Progress Notes (Signed)
Assessment & Plan:  1. Uncontrolled type 2 diabetes mellitus with hyperglycemia (HCC) Lab Results  Component Value Date   HGBA1C 8.4 (H) 04/29/2020   HGBA1C 8.2 (H) 12/18/2019   HGBA1C 7.4 (H) 09/09/2019    - Diabetes is not at goal of A1c < 7. - Medications: Continue metformin, Farxiga, and Novolin 70/30.  A co-pay card was printed off and given to patient for Januvia.  She was advised to let me know if she is still unable to afford the medication. - Home glucose monitoring: Continue monitoring - Patient is currently taking a statin. Patient is taking an ACE-inhibitor/ARB.  - Instruction/counseling given: discussed diet and provided printed educational material  Diabetes Health Maintenance Due  Topic Date Due  . OPHTHALMOLOGY EXAM  04/29/2020 (Originally 10/24/2018)  . FOOT EXAM  09/08/2020  . HEMOGLOBIN A1C  10/29/2020    - Lipid panel - CBC with Differential/Platelet - CMP14+EGFR - Bayer DCA Hb A1c Waived - Vitamin B12 level - dapagliflozin propanediol (FARXIGA) 10 MG TABS tablet; Take 1 tablet (10 mg total) by mouth daily before breakfast.  Dispense: 90 tablet; Refill: 1 - insulin NPH-regular Human (NOVOLIN 70/30) (70-30) 100 UNIT/ML injection; Inject 30 Units into the skin 2 (two) times daily with a meal.  Dispense: 20 mL; Refill: 2 - sitaGLIPtin (JANUVIA) 100 MG tablet; Take 1 tablet (100 mg total) by mouth daily.  Dispense: 90 tablet; Refill: 1  2. Essential hypertension, benign Well controlled on current regimen.  - CBC with Differential/Platelet - CMP14+EGFR - spironolactone (ALDACTONE) 25 MG tablet; Take 0.5 tablets (12.5 mg total) by mouth daily.  Dispense: 45 tablet; Refill: 1 - triamterene-hydrochlorothiazide (DYAZIDE) 37.5-25 MG capsule; Take 1 each (1 capsule total) by mouth daily.  Dispense: 90 capsule; Refill: 1 - metoprolol tartrate (LOPRESSOR) 25 MG tablet; Take 1 tablet (25 mg total) by mouth daily.  Dispense: 90 tablet; Refill: 1  3. Mixed  hyperlipidemia Labs to assess. - Lipid panel - CMP14+EGFR  4. Acute diverticulitis - amoxicillin-clavulanate (AUGMENTIN) 875-125 MG tablet; Take 1 tablet by mouth 2 (two) times daily for 7 days.  Dispense: 14 tablet; Refill: 0   Return in about 6 weeks (around 06/10/2020) for DM with Almyra Free; 3 months DM with me.  Hendricks Limes, MSN, APRN, FNP-C [facility]  Subjective:    Patient ID: Sara Stuart, female    DOB: [birth year], 65 y.o.   MRN: [id number]  Patient Care Team: Loman Brooklyn, FNP as PCP - General (Family Medicine) Lavera Guise, [facility] as Pharmacist (Family Medicine)   Chief Complaint:  Chief Complaint  Patient presents with  . Diabetes    3 month follow up  . Abdominal Pain    Patient states she has been having lower abd pain x 1 week. Patient states that her stool has not been the same week.     HPI: Sara Stuart is a 65 y.o. female presenting on 04/29/2020 for Diabetes (3 month follow up) and Abdominal Pain (Patient states she has been having lower abd pain x 1 week. Patient states that her stool has not been the same week. )  Diabetes: Patient presents for follow up of diabetes. Current symptoms include: hyperglycemia and hypoglycemia . Known diabetic complications: none. Medication compliance: She has been taking metformin and Farxiga as prescribed.  She takes Novolin 70/30 30 units 3 times a day and lasts her blood glucose is low, then she bases it off of what she eats.  She never picked up the  Januvia that was prescribed at our last visit due to cost. Current diet: in general, an "unhealthy" diet. Current exercise: walking. Home blood sugar records: symptomatic hypoglycemia occurs randomly but infrequently, additionally she has episodes of hyperglycemia. Is she  on ACE inhibitor or angiotensin II receptor blocker? Yes. Is she on a statin? Yes.   New complaints: Patient reports left lower quadrant abdominal pain x1 week.  She describes this pain as  stabbing.  She reports for 15 to 20 years she has had diarrhea every day.  Since this pain has been present her stools are formed.  She has been having horrible gas.  She denies any nausea, vomiting, or fever.  She had a colonoscopy completed last year which did show diverticulosis.   Social history:  Relevant past medical, surgical, family and social history reviewed and updated as indicated. Interim medical history since our last visit reviewed.  Allergies and medications reviewed and updated.  DATA REVIEWED: CHART IN EPIC  ROS: Negative unless specifically indicated above in HPI.    Current Outpatient Medications:  .  amoxicillin-clavulanate (AUGMENTIN) 875-125 MG tablet, Take 1 tablet by mouth 2 (two) times daily for 7 days., Disp: 14 tablet, Rfl: 0 .  aspirin EC 81 MG tablet, Take 1 tablet (81 mg total) by mouth daily., Disp: 30 tablet, Rfl:  .  baclofen (LIORESAL) 10 MG tablet, Take 10 mg by mouth 3 (three) times daily as needed., Disp: , Rfl:  .  DILT-XR 120 MG 24 hr capsule, Take 1 capsule by mouth once daily, Disp: 90 capsule, Rfl: 0 .  fluticasone (FLONASE) 50 MCG/ACT nasal spray, Place 2 sprays into both nostrils daily as needed., Disp: , Rfl:  .  glucose blood test strip, Test BS four times daily Dx E11.65, Disp: 400 each, Rfl: 3 .  ibuprofen (ADVIL) 800 MG tablet, Take 1 tablet (800 mg total) by mouth every 8 (eight) hours as needed for moderate pain., Disp: 90 tablet, Rfl: 1 .  insulin glargine (LANTUS SOLOSTAR) 100 UNIT/ML Solostar Pen, Inject 30 Units into the skin 2 (two) times daily., Disp: 15 mL, Rfl: 11 .  Insulin Pen Needle (B-D ULTRAFINE III SHORT PEN) 31G X 8 MM MISC, 1 each by Does not apply route as directed., Disp: 100 each, Rfl: 3 .  lisinopril (ZESTRIL) 2.5 MG tablet, Take 1 tablet by mouth once daily, Disp: 90 tablet, Rfl: 0 .  metFORMIN (GLUCOPHAGE-XR) 500 MG 24 hr tablet, Take 2 tablets (1,000 mg total) by mouth 2 (two) times daily. (NEEDS TO BE SEEN BEFORE  NEXT REFILL), Disp: 120 tablet, Rfl: 0 .  nitroGLYCERIN (NITROSTAT) 0.4 MG SL tablet, Place 1 tablet (0.4 mg total) under the tongue every 5 (five) minutes as needed., Disp: 25 tablet, Rfl: 3 .  omeprazole (PRILOSEC) 40 MG capsule, Take 1 capsule by mouth once daily, Disp: 90 capsule, Rfl: 0 .  rosuvastatin (CRESTOR) 10 MG tablet, Take 1 tablet (10 mg total) by mouth daily., Disp: 90 tablet, Rfl: 3 .  traZODone (DESYREL) 50 MG tablet, TAKE 1 TABLET BY MOUTH AT BEDTIME, Disp: 90 tablet, Rfl: 1 .  dapagliflozin propanediol (FARXIGA) 10 MG TABS tablet, Take 1 tablet (10 mg total) by mouth daily before breakfast., Disp: 90 tablet, Rfl: 1 .  insulin NPH-regular Human (NOVOLIN 70/30) (70-30) 100 UNIT/ML injection, Inject 30 Units into the skin 2 (two) times daily with a meal., Disp: 20 mL, Rfl: 2 .  metoprolol tartrate (LOPRESSOR) 25 MG tablet, Take 1 tablet (25 mg total)  by mouth daily., Disp: 90 tablet, Rfl: 1 .  sitaGLIPtin (JANUVIA) 100 MG tablet, Take 1 tablet (100 mg total) by mouth daily., Disp: 90 tablet, Rfl: 1 .  spironolactone (ALDACTONE) 25 MG tablet, Take 0.5 tablets (12.5 mg total) by mouth daily., Disp: 45 tablet, Rfl: 1 .  triamterene-hydrochlorothiazide (DYAZIDE) 37.5-25 MG capsule, Take 1 each (1 capsule total) by mouth daily., Disp: 90 capsule, Rfl: 1   Allergies  Allergen Reactions  . Trulicity [Dulaglutide]     GI intolerance   Past Medical History:  Diagnosis Date  . Arthritis   . Chronic back pain    spondylolishthesis  . DDD (degenerative disc disease), lumbosacral   . Diabetes mellitus without complication ([place]) diagnosed in 02/2014   takes Metformin and Glipizide daily  . GERD (gastroesophageal reflux disease)    takes Omeprazole daily  . Gout    takes Colchicine daily as needed  . Hypertension    takes Dyazide and Diltiazem daily  . Nocturia   . Polio   . PONV (postoperative nausea and vomiting)   . Wears glasses     Past Surgical History:  Procedure  Laterality Date  . ABDOMINAL HYSTERECTOMY  2002   TUBAL PREGNANCY  . BACK SURGERY  07/2012  . bladder tacked     . COLONOSCOPY    . FOOT SURGERY     more than 5 surgeries on right foot related to polio  . surgery for polio     states x 5   . TUBAL LIGATION      Social History   Socioeconomic History  . Marital status: Married    Spouse name: Not on file  . Number of children: Not on file  . Years of education: Not on file  . Highest education level: Not on file  Occupational History  . Not on file  Tobacco Use  . Smoking status: Never Smoker  . Smokeless tobacco: Never Used  Vaping Use  . Vaping Use: Never used  Substance and Sexual Activity  . Alcohol use: No  . Drug use: No  . Sexual activity: Yes    Birth control/protection: Surgical  Other Topics Concern  . Not on file  Social History Narrative  . Not on file   Social Determinants of Health   Financial Resource Strain: Not on file  Food Insecurity: Not on file  Transportation Needs: Not on file  Physical Activity: Not on file  Stress: Not on file  Social Connections: Not on file  Intimate Partner Violence: Not on file        Objective:    BP 122/82   Pulse 70   Temp (!) 97.4 F (36.3 C) (Temporal)   Ht $R'5\' 1"'Ap$  (1.549 m)   Wt 209 lb 12.8 oz (95.2 kg)   SpO2 96%   BMI 39.64 kg/m   Wt Readings from Last 3 Encounters:  04/29/20 209 lb 12.8 oz (95.2 kg)  01/21/20 217 lb 6.4 oz (98.6 kg)  12/18/19 222 lb 3.2 oz (100.8 kg)    Physical Exam Vitals reviewed.  Constitutional:      General: She is not in acute distress.    Appearance: Normal appearance. She is morbidly obese. She is not ill-appearing, toxic-appearing or diaphoretic.  HENT:     Head: Normocephalic and atraumatic.  Eyes:     General: No scleral icterus.       Right eye: No discharge.        Left eye: No discharge.  Conjunctiva/sclera: Conjunctivae normal.  Cardiovascular:     Rate and Rhythm: Normal rate and regular rhythm.      Heart sounds: Normal heart sounds. No murmur heard. No friction rub. No gallop.   Pulmonary:     Effort: Pulmonary effort is normal. No respiratory distress.     Breath sounds: Normal breath sounds. No stridor. No wheezing, rhonchi or rales.  Abdominal:     General: Abdomen is flat. Bowel sounds are normal. There is no distension or abdominal bruit. There are no signs of injury.     Palpations: There is mass (LLQ). There is no shifting dullness, hepatomegaly, splenomegaly or pulsatile mass.     Tenderness: There is abdominal tenderness in the left lower quadrant.  Musculoskeletal:        General: Normal range of motion.     Cervical back: Normal range of motion.  Skin:    General: Skin is warm and dry.     Capillary Refill: Capillary refill takes less than 2 seconds.  Neurological:     General: No focal deficit present.     Mental Status: She is alert and oriented to person, place, and time. Mental status is at baseline.  Psychiatric:        Mood and Affect: Mood normal.        Behavior: Behavior normal.        Thought Content: Thought content normal.        Judgment: Judgment normal.     Lab Results  Component Value Date   TSH 1.41 05/02/2017   Lab Results  Component Value Date   WBC 8.9 12/18/2019   HGB 12.6 12/18/2019   HCT 38.8 12/18/2019   MCV 84 12/18/2019   PLT 288 12/18/2019   Lab Results  Component Value Date   NA 136 12/18/2019   K 4.0 12/18/2019   CO2 20 12/18/2019   GLUCOSE 327 (H) 12/18/2019   BUN 15 12/18/2019   CREATININE 0.94 12/18/2019   BILITOT 0.3 12/18/2019   ALKPHOS 95 12/18/2019   AST 18 12/18/2019   ALT 20 12/18/2019   PROT 6.8 12/18/2019   ALBUMIN 4.2 12/18/2019   CALCIUM 9.1 12/18/2019   ANIONGAP 9 08/24/2019   Lab Results  Component Value Date   CHOL 197 05/02/2017   Lab Results  Component Value Date   HDL 52 05/02/2017   Lab Results  Component Value Date   LDLCALC 111 (H) 05/02/2017   Lab Results  Component Value Date    TRIG 217 (H) 05/02/2017   Lab Results  Component Value Date   CHOLHDL 3.8 05/02/2017   Lab Results  Component Value Date   HGBA1C 8.4 (H) 04/29/2020

## 2020-04-30 LAB — CBC WITH DIFFERENTIAL/PLATELET
Basophils Absolute: 0.1 10*3/uL (ref 0.0–0.2)
Basos: 1 %
EOS (ABSOLUTE): 0.3 10*3/uL (ref 0.0–0.4)
Eos: 3 %
Hematocrit: 41.8 % (ref 34.0–46.6)
Hemoglobin: 13.3 g/dL (ref 11.1–15.9)
Immature Grans (Abs): 0 10*3/uL (ref 0.0–0.1)
Immature Granulocytes: 0 %
Lymphocytes Absolute: 3.4 10*3/uL — ABNORMAL HIGH (ref 0.7–3.1)
Lymphs: 34 %
MCH: 26.4 pg — ABNORMAL LOW (ref 26.6–33.0)
MCHC: 31.8 g/dL (ref 31.5–35.7)
MCV: 83 fL (ref 79–97)
Monocytes Absolute: 0.8 10*3/uL (ref 0.1–0.9)
Monocytes: 8 %
Neutrophils Absolute: 5.6 10*3/uL (ref 1.4–7.0)
Neutrophils: 54 %
Platelets: 256 10*3/uL (ref 150–450)
RBC: 5.03 x10E6/uL (ref 3.77–5.28)
RDW: 13.1 % (ref 11.7–15.4)
WBC: 10.3 10*3/uL (ref 3.4–10.8)

## 2020-04-30 LAB — CMP14+EGFR
ALT: 14 IU/L (ref 0–32)
AST: 14 IU/L (ref 0–40)
Albumin/Globulin Ratio: 1.3 (ref 1.2–2.2)
Albumin: 3.9 g/dL (ref 3.8–4.8)
Alkaline Phosphatase: 102 IU/L (ref 44–121)
BUN/Creatinine Ratio: 19 (ref 12–28)
BUN: 17 mg/dL (ref 8–27)
Bilirubin Total: 0.3 mg/dL (ref 0.0–1.2)
CO2: 20 mmol/L (ref 20–29)
Calcium: 9.5 mg/dL (ref 8.7–10.3)
Chloride: 104 mmol/L (ref 96–106)
Creatinine, Ser: 0.89 mg/dL (ref 0.57–1.00)
Globulin, Total: 3 g/dL (ref 1.5–4.5)
Glucose: 97 mg/dL (ref 65–99)
Potassium: 3.8 mmol/L (ref 3.5–5.2)
Sodium: 142 mmol/L (ref 134–144)
Total Protein: 6.9 g/dL (ref 6.0–8.5)
eGFR: 72 mL/min/{1.73_m2} (ref >59)

## 2020-04-30 LAB — LIPID PANEL
Chol/HDL Ratio: 2.8 ratio (ref 0.0–4.4)
Cholesterol, Total: 144 mg/dL (ref 100–199)
HDL: 52 mg/dL (ref >39)
LDL Chol Calc (NIH): 62 mg/dL (ref 0–99)
Triglycerides: 181 mg/dL — ABNORMAL HIGH (ref 0–149)
VLDL Cholesterol Cal: 30 mg/dL (ref 5–40)

## 2020-05-02 ENCOUNTER — Encounter: Payer: Self-pay | Admitting: Family Medicine

## 2020-05-03 LAB — VITAMIN B12: Vitamin B-12: 260 pg/mL (ref 232–1245)

## 2020-05-03 LAB — SPECIMEN STATUS REPORT

## 2020-05-20 ENCOUNTER — Ambulatory Visit: Payer: 59 | Admitting: Pharmacist

## 2020-05-23 ENCOUNTER — Encounter: Payer: Self-pay | Admitting: Family Medicine

## 2020-05-25 ENCOUNTER — Other Ambulatory Visit: Payer: Self-pay | Admitting: Family Medicine

## 2020-05-31 ENCOUNTER — Other Ambulatory Visit: Payer: Self-pay

## 2020-05-31 ENCOUNTER — Ambulatory Visit (INDEPENDENT_AMBULATORY_CARE_PROVIDER_SITE_OTHER): Payer: 59 | Admitting: Pharmacist

## 2020-05-31 DIAGNOSIS — E1165 Type 2 diabetes mellitus with hyperglycemia: Secondary | ICD-10-CM | POA: Diagnosis not present

## 2020-05-31 NOTE — Progress Notes (Signed)
    05/31/2020 Name: Sara Stuart MRN: [id number] DOB: 1955/02/18   S:  71 yoF Presents for diabetes evaluation, education, and management Patient was referred and last seen by Primary Care Provider on 04/27/20.  Insurance coverage/medication affordability: [employer]  Patient reports adherence with medications.  Current diabetes medications include: novolin 70/30, metformin, JANUVIA, FARXIGA  Current hypertension medications include:dilt, metop, lisinopril, triam/hctz, spiro Goal 130/80 . Current hyperlipidemia medications include: rosuvastatin  Patient denies hypoglycemic events.   Patient reported dietary habits: Eats 2-3 meals/day Discussed meal planning options and Plate method for healthy eating . Avoid sugary drinks and desserts . Incorporate balanced protein, non starchy veggies, 1 serving of carbohydrate with each meal . Increase water intake . Increase physical activity as able  Patient-reported exercise habits: uses cane, needs knee replacement  O:  Lab Results  Component Value Date   HGBA1C 8.4 (H) 04/29/2020   Lipid Panel     Component Value Date/Time   CHOL 144 04/29/2020 1401   TRIG 181 (H) 04/29/2020 1401   HDL 52 04/29/2020 1401   CHOLHDL 2.8 04/29/2020 1401   CHOLHDL 3.8 05/02/2017 1617   LDLCALC 62 04/29/2020 1401   LDLCALC 111 (H) 05/02/2017 1617    Home fasting blood sugars: 200-300s  2 hour post-meal/random blood sugars: 300s.   Clinical Atherosclerotic Cardiovascular Disease (ASCVD): Yes   The 10-year ASCVD risk score Denman George DC Jr., et al., 2013) is: 9.8%   Values used to calculate the score:     Age: 65 years     Sex: Female     Is Non-Hispanic African American: No     Diabetic: Yes     Tobacco smoker: No     Systolic Blood Pressure: 122 mmHg     Is BP treated: Yes     HDL Cholesterol: 52 mg/dL     Total Cholesterol: 144 mg/dL    A/P:  Diabetes D5HG currently uncontrolled.  Patient is adherent with medication. Control is  suboptimal due to cost/insurance/diet/lifestyle  -Continue Novolin 70/30 to 15-20 units in the AM and PM with meals.    -Samples of Levemir given to patient for basal control--we are limited with medications due to insurance.   Starting 10 units tonight  Will attempt to transition to basal/bolus coverage as able through samples until medicare kicks in next year  Patient to call me Friday to make adjustments as needed  -CONTINUE Farxiga 10mg  daily, GFR60, encouraged increased hydration (water)             -CounseledSick day rules: ifsick, vomiting, have diarrhea, or  cannot drink enough fluids, you should stop taking SGLT-2 inhibitors until your symptoms go away. In rare cases, these medicines can cause diabetic ketoacidosis (DKA). DKA is acid buildup in the blood.             -Free 30 day card given             -$0copay card given, f/u on insurance coverage   -Continue Januvia/metformin; GFR 72  -Patient did not tolerate Trulicity due to GI distress  -CGM not covered on insurance  -Extensively discussed pathophysiology of diabetes, recommended lifestyle interventions, dietary effects on blood sugar control  -Counseled on s/sx of and management of hypoglycemia   Written patient instructions provided.  Total time in face to face counseling 25 minutes.   , PharmD, BCPS Clinical Pharmacist, [facility]  II Phone [phone]

## 2020-06-03 ENCOUNTER — Telehealth: Payer: Self-pay | Admitting: Family Medicine

## 2020-06-03 ENCOUNTER — Other Ambulatory Visit: Payer: Self-pay | Admitting: Family Medicine

## 2020-06-03 NOTE — Telephone Encounter (Signed)
Increase levemir 15 units nightly--pt is forgetting to take metformin, etc FBG 256

## 2020-06-03 NOTE — Telephone Encounter (Signed)
Lmtcb.

## 2020-06-07 NOTE — Telephone Encounter (Signed)
Patient aware and verbalizes understanding. 

## 2020-06-22 ENCOUNTER — Other Ambulatory Visit: Payer: Self-pay | Admitting: Family Medicine

## 2020-06-22 DIAGNOSIS — Z1231 Encounter for screening mammogram for malignant neoplasm of breast: Secondary | ICD-10-CM

## 2020-06-23 LAB — HM DIABETES EYE EXAM

## 2020-06-27 ENCOUNTER — Other Ambulatory Visit: Payer: Self-pay | Admitting: Family Medicine

## 2020-06-28 ENCOUNTER — Ambulatory Visit (INDEPENDENT_AMBULATORY_CARE_PROVIDER_SITE_OTHER): Payer: 59 | Admitting: Nurse Practitioner

## 2020-06-28 ENCOUNTER — Other Ambulatory Visit: Payer: Self-pay

## 2020-06-28 ENCOUNTER — Encounter: Payer: Self-pay | Admitting: Nurse Practitioner

## 2020-06-28 ENCOUNTER — Ambulatory Visit (INDEPENDENT_AMBULATORY_CARE_PROVIDER_SITE_OTHER): Payer: 59

## 2020-06-28 VITALS — BP 99/66 | HR 62 | Temp 97.8°F | Ht 61.0 in | Wt 206.0 lb

## 2020-06-28 DIAGNOSIS — M79601 Pain in right arm: Secondary | ICD-10-CM

## 2020-06-28 NOTE — Assessment & Plan Note (Signed)
Patient injured right upper arm while trying to pull cart of groceries in the last few days.  Symptoms not well controlled.  Completed right upper arm x-ray results pending.  Encourage patient to continue ice pack, elevate arm, anti-inflammatory as prescribed.  Follow-up with worsening or unresolved symptoms.  Patient verbalized understanding

## 2020-06-28 NOTE — Progress Notes (Signed)
Acute Office Visit  Subjective:    Patient ID: Sara Stuart, female    DOB: [birth year], 65 y.o.   MRN: [id number]  Chief Complaint  Patient presents with   Arm Pain    HPI Patient is in today for Pain  She reports new onset Right upper arm pain. was an injury that may have caused the pain. The pain started a few days ago and is staying constant. The pain does not radiate aching dull pain with movement. The pain is described as aching and soreness, is moderate in intensity, occurring intermittently. Symptoms are worse in the: mid-day  Aggravating factors: lifting arm above head  Relieving factors: none.  She has tried application of ice with no relief.   ---------------------------------------------------------------------------------------------------   Past Medical History:  Diagnosis Date   Arthritis    Chronic back pain    spondylolishthesis   DDD (degenerative disc disease), lumbosacral    Diabetes mellitus without complication ([place]) diagnosed in 02/2014   takes Metformin and Glipizide daily   GERD (gastroesophageal reflux disease)    takes Omeprazole daily   Gout    takes Colchicine daily as needed   Hypertension    takes Dyazide and Diltiazem daily   Nocturia    Polio    PONV (postoperative nausea and vomiting)    Wears glasses     Past Surgical History:  Procedure Laterality Date   BACK SURGERY  07/2012   bladder tacked      COLONOSCOPY     FOOT SURGERY     more than 5 surgeries on right foot related to polio   surgery for polio     states x 5    TOTAL ABDOMINAL HYSTERECTOMY  2002   TUBAL PREGNANCY   TUBAL LIGATION      Family History  Problem Relation Age of Onset   Colon cancer Mother    Diabetes Father    Thyroid disease Father    Heart attack Father     Social History   Socioeconomic History   Marital status: Married    Spouse name: Not on file   Number of children: Not on file   Years of education: Not on file   Highest education level:  Not on file  Occupational History   Not on file  Tobacco Use   Smoking status: Never   Smokeless tobacco: Never  Vaping Use   Vaping Use: Never used  Substance and Sexual Activity   Alcohol use: No   Drug use: No   Sexual activity: Yes    Birth control/protection: Surgical  Other Topics Concern   Not on file  Social History Narrative   Not on file   Social Determinants of Health   Financial Resource Strain: Not on file  Food Insecurity: Not on file  Transportation Needs: Not on file  Physical Activity: Not on file  Stress: Not on file  Social Connections: Not on file  Intimate Partner Violence: Not on file    Outpatient Medications Prior to Visit  Medication Sig Dispense Refill   aspirin EC 81 MG tablet Take 1 tablet (81 mg total) by mouth daily. 30 tablet    baclofen (LIORESAL) 10 MG tablet Take 10 mg by mouth 3 (three) times daily as needed.     dapagliflozin propanediol (FARXIGA) 10 MG TABS tablet Take 1 tablet (10 mg total) by mouth daily before breakfast. 90 tablet 1   DILT-XR 120 MG 24 hr capsule Take 1 capsule by mouth once  daily 90 capsule 0   fluticasone (FLONASE) 50 MCG/ACT nasal spray Place 2 sprays into both nostrils daily as needed.     glucose blood test strip Test BS four times daily Dx E11.65 400 each 3   ibuprofen (ADVIL) 800 MG tablet Take 1 tablet (800 mg total) by mouth every 8 (eight) hours as needed for moderate pain. 90 tablet 1   insulin NPH-regular Human (NOVOLIN 70/30) (70-30) 100 UNIT/ML injection Inject 30 Units into the skin 2 (two) times daily with a meal. 20 mL 2   Insulin Pen Needle (B-D ULTRAFINE III SHORT PEN) 31G X 8 MM MISC 1 each by Does not apply route as directed. 100 each 3   lisinopril (ZESTRIL) 2.5 MG tablet Take 1 tablet by mouth once daily 90 tablet 0   metFORMIN (GLUCOPHAGE-XR) 500 MG 24 hr tablet Take 2 tablets (1,000 mg total) by mouth 2 (two) times daily. 120 tablet 2   metoprolol tartrate (LOPRESSOR) 25 MG tablet Take 1 tablet  (25 mg total) by mouth daily. 90 tablet 1   nitroGLYCERIN (NITROSTAT) 0.4 MG SL tablet Place 1 tablet (0.4 mg total) under the tongue every 5 (five) minutes as needed. 25 tablet 3   omeprazole (PRILOSEC) 40 MG capsule Take 1 capsule by mouth once daily 90 capsule 0   rosuvastatin (CRESTOR) 10 MG tablet Take 1 tablet (10 mg total) by mouth daily. 90 tablet 3   sitaGLIPtin (JANUVIA) 100 MG tablet Take 1 tablet (100 mg total) by mouth daily. 90 tablet 1   spironolactone (ALDACTONE) 25 MG tablet Take 0.5 tablets (12.5 mg total) by mouth daily. 45 tablet 1   traZODone (DESYREL) 50 MG tablet TAKE 1 TABLET BY MOUTH AT BEDTIME 90 tablet 1   triamterene-hydrochlorothiazide (DYAZIDE) 37.5-25 MG capsule Take 1 each (1 capsule total) by mouth daily. 90 capsule 1   No facility-administered medications prior to visit.    Allergies  Allergen Reactions   Trulicity [Dulaglutide]     GI intolerance    Review of Systems  Constitutional: Negative.   HENT: Negative.    Respiratory: Negative.    Cardiovascular: Negative.   Gastrointestinal: Negative.   Musculoskeletal:  Positive for joint swelling.  Skin:  Negative for rash.  All other systems reviewed and are negative.     Objective:    Physical Exam Vitals reviewed.  Constitutional:      Appearance: Normal appearance.  HENT:     Head: Normocephalic.     Nose: Nose normal.  Eyes:     Conjunctiva/sclera: Conjunctivae normal.  Cardiovascular:     Rate and Rhythm: Normal rate.     Pulses: Normal pulses.     Heart sounds: Normal heart sounds.  Pulmonary:     Effort: Pulmonary effort is normal.     Breath sounds: Normal breath sounds.  Abdominal:     General: Bowel sounds are normal.  Musculoskeletal:     Right upper arm: Swelling and tenderness present. No edema or lacerations.       Arms:  Skin:    Findings: No rash.  Neurological:     Mental Status: She is alert and oriented to person, place, and time.  Psychiatric:         Behavior: Behavior normal.    BP 99/66   Pulse 62   Temp 97.8 F (36.6 C) (Temporal)   Ht _0  (1.549 m)   Wt 206 lb (93.4 kg)   SpO2 97%   BMI 38.92 kg/m  Wt Readings from Last 3 Encounters:  06/28/20 206 lb (93.4 kg)  04/29/20 209 lb 12.8 oz (95.2 kg)  01/21/20 217 lb 6.4 oz (98.6 kg)    Health Maintenance Due  Topic Date Due   Zoster Vaccines- Shingrix (1 of 2) Never done   OPHTHALMOLOGY EXAM  10/24/2018   COVID-19 Vaccine (3 - Booster for Moderna series) 05/02/2020    There are no preventive care reminders to display for this patient.   Lab Results  Component Value Date   TSH 1.41 05/02/2017   Lab Results  Component Value Date   WBC 10.3 04/29/2020   HGB 13.3 04/29/2020   HCT 41.8 04/29/2020   MCV 83 04/29/2020   PLT 256 04/29/2020   Lab Results  Component Value Date   NA 142 04/29/2020   K 3.8 04/29/2020   CO2 20 04/29/2020   GLUCOSE 97 04/29/2020   BUN 17 04/29/2020   CREATININE 0.89 04/29/2020   BILITOT 0.3 04/29/2020   ALKPHOS 102 04/29/2020   AST 14 04/29/2020   ALT 14 04/29/2020   PROT 6.9 04/29/2020   ALBUMIN 3.9 04/29/2020   CALCIUM 9.5 04/29/2020   ANIONGAP 9 08/24/2019   EGFR 72 04/29/2020   Lab Results  Component Value Date   CHOL 144 04/29/2020   Lab Results  Component Value Date   HDL 52 04/29/2020   Lab Results  Component Value Date   LDLCALC 62 04/29/2020   Lab Results  Component Value Date   TRIG 181 (H) 04/29/2020   Lab Results  Component Value Date   CHOLHDL 2.8 04/29/2020   Lab Results  Component Value Date   HGBA1C 8.4 (H) 04/29/2020       Assessment & Plan:   Problem List Items Addressed This Visit       Other   Pain of right upper extremity - Primary    Patient injured right upper arm while trying to pull cart of groceries in the last few days.  Symptoms not well controlled.  Completed right upper arm x-ray results pending.  Encourage patient to continue ice pack, elevate arm, anti-inflammatory as  prescribed.  Follow-up with worsening or unresolved symptoms.  Patient verbalized understanding       Relevant Orders   DG Humerus Right     No orders of the defined types were placed in this encounter.    Ivy Lynn, NP

## 2020-06-28 NOTE — Patient Instructions (Signed)
Hand Pain Many things can cause hand pain. Some common causes are: An injury. Repeating the same movement with your hand over and over (overuse). Osteoporosis. Arthritis. Lumps in the tendons or joints of the hand and wrist (ganglion cysts). Nerve compression syndromes (carpal tunnel syndrome). Inflammation of the tendons (tendinitis). Infection. Follow these instructions at home: Pay attention to any changes in your symptoms. Take these actions to help withyour discomfort: Managing pain, stiffness, and swelling  Take over-the-counter and prescription medicines only as told by your health care provider. Wear a hand splint or support as told by your health care provider. If directed, put ice on the affected area: Put ice in a plastic bag. Place a towel between your skin and the bag. Leave the ice on for 20 minutes, 2-3 times a day.  Activity Take breaks from repetitive activity often. Avoid activities that make your pain worse. Minimize stress on your hands and wrists as much as possible. Do stretches or exercises as told by your health care provider. Do not do activities that make your pain worse. Contact a health care provider if: Your pain does not get better after a few days of self-care. Your pain gets worse. Your pain affects your ability to do your daily activities. Get help right away if: Your hand becomes warm, red, or swollen. Your hand is numb or tingling. Your hand is extremely swollen or deformed. Your hand or fingers turn white or blue. You cannot move your hand, wrist, or fingers. Summary Many things can cause hand pain. Contact your health care provider if your pain does not get better after a few days of self care. Minimize stress on your hands and wrists as much as possible. Do not do activities that make your pain worse. This information is not intended to replace advice given to you by your health care provider. Make sure you discuss any questions you have  with your healthcare provider. Document Revised: 09/13/2017 Document Reviewed: 09/13/2017 Elsevier Patient Education  2022 [employer]vier Inc.  

## 2020-07-12 ENCOUNTER — Telehealth: Payer: Self-pay | Admitting: Family Medicine

## 2020-07-12 DIAGNOSIS — M79601 Pain in right arm: Secondary | ICD-10-CM

## 2020-07-12 MED ORDER — OMEPRAZOLE 40 MG PO CPDR: 40.0000 mg | DELAYED_RELEASE_CAPSULE | Freq: Every day | ORAL | 1 refills | Status: DC

## 2020-07-12 NOTE — Telephone Encounter (Signed)
REFERRAL REQUEST Telephone Note  Have you been seen at our office for this problem? Yes about a week ago (Advise that they may need an appointment with their PCP before a referral can be done)  Reason for Referral: pt want mri of her arm Referral discussed with patient: yes Best contact number of patient for referral team:   [phone] Has patient been seen by a specialist for this issue before: no Patient provider preference for referral: na Patient location preference for referral: na   Patient notified that referrals can take up to a week or longer to process. If they haven't heard anything within a week they should call back and speak with the referral department.

## 2020-07-12 NOTE — Telephone Encounter (Signed)
Patient seen Sara Stuart on 6/28  Treating physician OFF Will send to PCP to advise

## 2020-07-12 NOTE — Telephone Encounter (Signed)
I can offer a referral to physical therapy.  Insurance typically does not cover an MRI until there has been 6 weeks of physical therapy and an x-ray completed.

## 2020-07-12 NOTE — Telephone Encounter (Signed)
Patient would like to go ahead with the referral for physical therapy.

## 2020-07-13 NOTE — Telephone Encounter (Signed)
Referral placed.

## 2020-07-20 ENCOUNTER — Other Ambulatory Visit: Payer: Self-pay

## 2020-07-20 ENCOUNTER — Ambulatory Visit: Payer: 59 | Attending: Family Medicine | Admitting: Physical Therapy

## 2020-07-20 ENCOUNTER — Encounter: Payer: Self-pay | Admitting: Physical Therapy

## 2020-07-20 DIAGNOSIS — M25511 Pain in right shoulder: Secondary | ICD-10-CM | POA: Insufficient documentation

## 2020-07-20 NOTE — Therapy (Signed)
[facility] [address] [place], [place], [address] Phone: [phone]   Fax:  [phone]  Physical Therapy Evaluation  Patient Details  Name: Sara Stuart MRN: [id number] Date of Birth: [birth year] Referring Provider (PT): [name]   Encounter Date: 07/20/2020   PT End of Session - 07/20/20 1105     Visit Number 1    Number of Visits 6    Date for PT Re-Evaluation 08/31/20    PT Start Time 1040    PT Stop Time 1123    PT Time Calculation (min) 43 min    Activity Tolerance Patient tolerated treatment well    Behavior During Therapy [facility] for tasks assessed/performed             Past Medical History:  Diagnosis Date   Arthritis    Chronic back pain    spondylolishthesis   DDD (degenerative disc disease), lumbosacral    Diabetes mellitus without complication (HCC) diagnosed in 02/2014   takes Metformin and Glipizide daily   GERD (gastroesophageal reflux disease)    takes Omeprazole daily   Gout    takes Colchicine daily as needed   Hypertension    takes Dyazide and Diltiazem daily   Nocturia    Polio    PONV (postoperative nausea and vomiting)    Wears glasses     Past Surgical History:  Procedure Laterality Date   BACK SURGERY  07/2012   bladder tacked      COLONOSCOPY     FOOT SURGERY     more than 5 surgeries on right foot related to polio   surgery for polio     states x 5    TOTAL ABDOMINAL HYSTERECTOMY  2002   TUBAL PREGNANCY   TUBAL LIGATION      There were no vitals filed for this visit.    Subjective Assessment - 07/20/20 1112     Subjective COVID-19 screen performed prior to patient entering clinic.  The patient presents to the clinic today with c/o right shoulder pain due to pulling a heavy cart on 06/29/20 in which she felt a pop.  She rates her pain at a 6/10 today and can rises  to higher levels with certain movements. She has no c/o neck pain.  She cannot sleep on her right shoulder.  Heat  and rest decreases pain.    Pertinent History DM, GERD, HTN, DDD, back and foot surgery.    Patient Stated Goals Use right UE without pain.    Currently in Pain? Yes    Pain Score 6     Pain Location Shoulder    Pain Orientation Right    Pain Descriptors / Indicators Aching;Throbbing    Pain Type Acute pain    Pain Onset 1 to 4 weeks ago    Pain Frequency Constant    Aggravating Factors  See above.    Pain Relieving Factors See above.                [facility] PT Assessment - 07/20/20 0001       Assessment   Medical Diagnosis Pain of right upper extremity.    Referring Provider (PT) [name]    Onset Date/Surgical Date 06/29/20      Precautions   Precautions None      Restrictions   Weight Bearing Restrictions No      Balance Screen   Has the patient fallen in the past 6 months No    Has the  patient had a decrease in activity level because of a fear of falling?  No    Is the patient reluctant to leave their home because of a fear of falling?  No      Home Environment   Living Environment Private residence      Prior Function   Level of Independence Independent      Posture/Postural Control   Posture/Postural Control Postural limitations    Postural Limitations Rounded Shoulders;Forward head      Deep Tendon Reflexes   DTR Assessment Site Biceps;Brachioradialis;Triceps    Biceps DTR 2+    Brachioradialis DTR 2+    Triceps DTR 2+      ROM / Strength   AROM / PROM / Strength AROM;Strength      AROM   Overall AROM Comments Full active right shoulder range of motion.      Strength   Overall Strength Comments Patient has no notable right shoulder strength deficits.      Palpation   Palpation comment Tender to palpation over patient's right middled eltoid and especially near its humeral attachment.      Special Tests   Other special tests (-) Drop Arm test. (-) right shoulder Impingement testing.      Ambulation/Gait   Gait Comments Ambuation with  straight cane.                        Objective measurements completed on examination: See above findings.       [facility] Adult PT Treatment/Exercise - 07/20/20 0001       Modalities   Modalities Electrical Stimulation;Moist Heat      Moist Heat Therapy   Number Minutes Moist Heat 20 Minutes    Moist Heat Location --   Rigth shoulder.     Programme researcher, broadcasting/film/video Location Right middle deltoid region.    Electrical Stimulation Action IFC at 80-150 Hz.    Electrical Stimulation Parameters 40% scan x 20 minutes.                         PT Long Term Goals - 07/20/20 1152       PT LONG TERM GOAL #1   Title Independent with a HEP.    Time 6    Period Weeks    Status New      PT LONG TERM GOAL #2   Title Perform ADL's with pain not > 2-3/10.    Time 6    Period Weeks    Status New      PT LONG TERM GOAL #3   Title Sleep undisturbed on right shoulder.    Time 6    Period Weeks    Status New                    Plan - 07/20/20 1136     Clinical Impression Statement The patient presents to OPPT with c/o right shoulder pain due to a pulling injury on 06/29/20.  She continues to have pain and cannot sleep on her right shoulder.  She exhibits full right [place] active range of motion and her strength is essentially normal.  UE DTR's are intact.  She has no c/o neck pain.  Special testing is negative.  She is tender to palpation over her right middle deltoid especially near the humeral attachment.    Personal Factors and Comorbidities Comorbidity 1;Comorbidity 2  Comorbidities DM, GERD, HTN, DDD, back and foot surgery.    Examination-Activity Limitations Other;Sleep    Examination-Participation Restrictions Other    Stability/Clinical Decision Making Stable/Uncomplicated    Clinical Decision Making Low    Rehab Potential Excellent    PT Frequency 1x / week    PT Duration 6 weeks   Patient plans to limit visits  due to a high co-pay.   PT Treatment/Interventions ADLs/Self Care Home Management;Cryotherapy;Electrical Stimulation;Ultrasound;Moist Heat;Iontophoresis 4mg /ml Dexamethasone;Therapeutic activities;Therapeutic exercise;Manual techniques;Patient/family education;Passive range of motion;Dry needling;Vasopneumatic Device    PT Next Visit Plan Combo e'stim/US to patient's right middle deltoid region, STW/M which can include IASTM, HMP/E'stim.  .    Consulted and Agree with Plan of Care Patient             Patient will benefit from skilled therapeutic intervention in order to improve the following deficits and impairments:  Pain, Increased muscle spasms, Decreased activity tolerance  Visit Diagnosis: Acute pain of right shoulder - Plan: PT plan of care cert/re-cert     Problem List Patient Active Problem List   Diagnosis Date Noted   Pain of right upper extremity 06/28/2020   Insomnia 12/18/2019   Coronary artery disease involving native coronary artery of native heart without angina pectoris 02/03/2019   Mixed hyperlipidemia 10/10/2017   DM type 2 causing vascular disease (HCC) 03/21/2017   Uncontrolled type 2 diabetes mellitus with hyperglycemia (HCC) 03/14/2017   Essential hypertension, benign 03/14/2017   Chest pain 12/29/2012   GERD (gastroesophageal reflux disease) 12/29/2012   History of gout 12/29/2012   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 [place], [place], [place] Phone: [phone]   Fax:  [phone]  Name: Sara Stuart MRN: Mallie Mussel Date of Birth: 03-22-1955

## 2020-07-28 ENCOUNTER — Ambulatory Visit: Payer: 59 | Admitting: Physical Therapy

## 2020-07-29 ENCOUNTER — Ambulatory Visit (INDEPENDENT_AMBULATORY_CARE_PROVIDER_SITE_OTHER): Payer: 59 | Admitting: Family Medicine

## 2020-07-29 ENCOUNTER — Encounter: Payer: Self-pay | Admitting: Family Medicine

## 2020-07-29 ENCOUNTER — Telehealth: Payer: Self-pay | Admitting: Family Medicine

## 2020-07-29 ENCOUNTER — Other Ambulatory Visit: Payer: Self-pay

## 2020-07-29 VITALS — BP 107/65 | HR 61 | Temp 97.4°F | Resp 20 | Ht 61.0 in | Wt 206.0 lb

## 2020-07-29 DIAGNOSIS — E1165 Type 2 diabetes mellitus with hyperglycemia: Secondary | ICD-10-CM

## 2020-07-29 DIAGNOSIS — I10 Essential (primary) hypertension: Secondary | ICD-10-CM

## 2020-07-29 DIAGNOSIS — E782 Mixed hyperlipidemia: Secondary | ICD-10-CM

## 2020-07-29 LAB — BAYER DCA HB A1C WAIVED: HB A1C (BAYER DCA - WAIVED): 8.1 % — ABNORMAL HIGH (ref <7.0)

## 2020-07-29 MED ORDER — NOVOLIN 70/30 (70-30) 100 UNIT/ML ~~LOC~~ SUSP: 32.0000 [IU] | Freq: Three times a day (TID) | SUBCUTANEOUS | 2 refills | Status: DC

## 2020-07-29 NOTE — Progress Notes (Signed)
Assessment & Plan:  1. Uncontrolled type 2 diabetes mellitus with hyperglycemia (HCC) Lab Results  Component Value Date   HGBA1C 8.1 (H) 07/29/2020   HGBA1C 8.4 (H) 04/29/2020   HGBA1C 8.2 (H) 12/18/2019    - Diabetes is not at goal of A1c < 7, but is improving. - Medications:  increase Novolin from 30 units to 32 units, increase Levemir from 15 units to 20 units. Discussed not skipping Levemir.  - Home glucose monitoring: continue monitoring - Patient is currently taking a statin. Patient is taking an ACE-inhibitor/ARB.  - Instruction/counseling given: discussed diet and provided printed educational material  Diabetes Health Maintenance Due  Topic Date Due   OPHTHALMOLOGY EXAM  10/24/2018   FOOT EXAM  09/08/2020   HEMOGLOBIN A1C  01/29/2021    - Bayer DCA Hb A1c Waived - insulin detemir (LEVEMIR) 100 UNIT/ML injection; Inject 20 Units into the skin at bedtime. - insulin NPH-regular Human (NOVOLIN 70/30) (70-30) 100 UNIT/ML injection; Inject 32 Units into the skin 3 (three) times daily with meals.  Dispense: 20 mL; Refill: 2  2. Essential hypertension, benign Well controlled on current regimen.    Return in about 6 weeks (around 09/09/2020) for DM with Almyra Free, then 3 months with me DM.  Hendricks Limes, MSN, APRN, FNP-C [facility]  Subjective:    Patient ID: Sara Stuart, female    DOB: 1955-06-13, 65 y.o.   MRN: [id number]  Patient Care Team: Loman Brooklyn, FNP as PCP - General (Family Medicine) Lavera Guise, [facility] as Pharmacist (Family Medicine)   Chief Complaint:  Chief Complaint  Patient presents with   Medical Management of Chronic Issues    3 mo    HPI: Sara Stuart is a 65 y.o. female presenting on 07/29/2020 for Medical Management of Chronic Issues (3 mo)  Diabetes: Patient presents for follow up of diabetes. Current symptoms include: hyperglycemia. Known diabetic complications: none. Medication compliance: Yes, but decreases her  Levemir to 8-10 units if her blood sugar is 100-150. Current diet: in general, an "unhealthy" diet. Current exercise: walking. Home blood sugar records:  she is checking fasting, before lunch, before dinner, and at bedtime. Fasting ranges 117-396 with 4/71 <150; before lunch ranges 125-569 with 3/66 <180; before dinner ranges 67-538 with 36/70 <180 (of the 36, 8 were <100 and only 1 of 8 was <70); bedtime ranges 59-325 with 37/68 <180 (8 of the 37 were <100 and 2 of the 8 were less than 70; the lowest of 59 was after 50 units of Novolin when her dinner blood sugar wsa 538) . Is she  on ACE inhibitor or angiotensin II receptor blocker? Yes. Is she on a statin? Yes.   New complaints: None   Social history:  Relevant past medical, surgical, family and social history reviewed and updated as indicated. Interim medical history since our last visit reviewed.  Allergies and medications reviewed and updated.  DATA REVIEWED: CHART IN EPIC  ROS: Negative unless specifically indicated above in HPI.    Current Outpatient Medications:    aspirin EC 81 MG tablet, Take 1 tablet (81 mg total) by mouth daily., Disp: 30 tablet, Rfl:    baclofen (LIORESAL) 10 MG tablet, Take 10 mg by mouth 3 (three) times daily as needed., Disp: , Rfl:    dapagliflozin propanediol (FARXIGA) 10 MG TABS tablet, Take 1 tablet (10 mg total) by mouth daily before breakfast., Disp: 90 tablet, Rfl: 1   DILT-XR 120 MG 24 hr capsule,  Take 1 capsule by mouth once daily, Disp: 90 capsule, Rfl: 0   glucose blood test strip, Test BS four times daily Dx E11.65, Disp: 400 each, Rfl: 3   insulin detemir (LEVEMIR) 100 UNIT/ML injection, Inject 15 Units into the skin at bedtime., Disp: , Rfl:    insulin NPH-regular Human (NOVOLIN 70/30) (70-30) 100 UNIT/ML injection, Inject 30 Units into the skin 2 (two) times daily with a meal., Disp: 20 mL, Rfl: 2   Insulin Pen Needle (B-D ULTRAFINE III SHORT PEN) 31G X 8 MM MISC, 1 each by Does not apply  route as directed., Disp: 100 each, Rfl: 3   lisinopril (ZESTRIL) 2.5 MG tablet, Take 1 tablet by mouth once daily, Disp: 90 tablet, Rfl: 0   metFORMIN (GLUCOPHAGE-XR) 500 MG 24 hr tablet, Take 2 tablets (1,000 mg total) by mouth 2 (two) times daily., Disp: 120 tablet, Rfl: 2   metoprolol tartrate (LOPRESSOR) 25 MG tablet, Take 1 tablet (25 mg total) by mouth daily., Disp: 90 tablet, Rfl: 1   omeprazole (PRILOSEC) 40 MG capsule, Take 1 capsule (40 mg total) by mouth daily., Disp: 90 capsule, Rfl: 1   rosuvastatin (CRESTOR) 10 MG tablet, Take 1 tablet (10 mg total) by mouth daily., Disp: 90 tablet, Rfl: 3   sitaGLIPtin (JANUVIA) 100 MG tablet, Take 1 tablet (100 mg total) by mouth daily., Disp: 90 tablet, Rfl: 1   spironolactone (ALDACTONE) 25 MG tablet, Take 0.5 tablets (12.5 mg total) by mouth daily., Disp: 45 tablet, Rfl: 1   traZODone (DESYREL) 50 MG tablet, TAKE 1 TABLET BY MOUTH AT BEDTIME, Disp: 90 tablet, Rfl: 1   triamterene-hydrochlorothiazide (DYAZIDE) 37.5-25 MG capsule, Take 1 each (1 capsule total) by mouth daily., Disp: 90 capsule, Rfl: 1   fluticasone (FLONASE) 50 MCG/ACT nasal spray, Place 2 sprays into both nostrils daily as needed. (Patient not taking: Reported on 07/29/2020), Disp: , Rfl:    ibuprofen (ADVIL) 800 MG tablet, Take 1 tablet (800 mg total) by mouth every 8 (eight) hours as needed for moderate pain. (Patient not taking: Reported on 07/29/2020), Disp: 90 tablet, Rfl: 1   nitroGLYCERIN (NITROSTAT) 0.4 MG SL tablet, Place 1 tablet (0.4 mg total) under the tongue every 5 (five) minutes as needed. (Patient not taking: Reported on 07/29/2020), Disp: 25 tablet, Rfl: 3   Allergies  Allergen Reactions   Trulicity [Dulaglutide]     GI intolerance   Past Medical History:  Diagnosis Date   Arthritis    Chronic back pain    spondylolishthesis   DDD (degenerative disc disease), lumbosacral    Diabetes mellitus without complication ([place]) diagnosed in 02/2014   takes Metformin  and Glipizide daily   GERD (gastroesophageal reflux disease)    takes Omeprazole daily   Gout    takes Colchicine daily as needed   Hypertension    takes Dyazide and Diltiazem daily   Nocturia    Polio    PONV (postoperative nausea and vomiting)    Wears glasses     Past Surgical History:  Procedure Laterality Date   BACK SURGERY  07/2012   bladder tacked      COLONOSCOPY     FOOT SURGERY     more than 5 surgeries on right foot related to polio   surgery for polio     states x 5    TOTAL ABDOMINAL HYSTERECTOMY  2002   TUBAL PREGNANCY   TUBAL LIGATION      Social History   Socioeconomic History  Marital status: Married    Spouse name: Not on file   Number of children: Not on file   Years of education: Not on file   Highest education level: Not on file  Occupational History   Not on file  Tobacco Use   Smoking status: Never   Smokeless tobacco: Never  Vaping Use   Vaping Use: Never used  Substance and Sexual Activity   Alcohol use: No   Drug use: No   Sexual activity: Yes    Birth control/protection: Surgical  Other Topics Concern   Not on file  Social History Narrative   Not on file   Social Determinants of Health   Financial Resource Strain: Not on file  Food Insecurity: Not on file  Transportation Needs: Not on file  Physical Activity: Not on file  Stress: Not on file  Social Connections: Not on file  Intimate Partner Violence: Not on file        Objective:    BP 107/65   Pulse 61   Temp (!) 97.4 F (36.3 C)   Resp 20   Ht '5\' 1"'  (1.549 m)   Wt 206 lb (93.4 kg)   SpO2 98%   BMI 38.92 kg/m   Wt Readings from Last 3 Encounters:  07/29/20 206 lb (93.4 kg)  06/28/20 206 lb (93.4 kg)  04/29/20 209 lb 12.8 oz (95.2 kg)    Physical Exam Vitals reviewed.  Constitutional:      General: She is not in acute distress.    Appearance: Normal appearance. She is morbidly obese. She is not ill-appearing, toxic-appearing or diaphoretic.  HENT:      Head: Normocephalic and atraumatic.  Eyes:     General: No scleral icterus.       Right eye: No discharge.        Left eye: No discharge.     Conjunctiva/sclera: Conjunctivae normal.  Cardiovascular:     Rate and Rhythm: Normal rate and regular rhythm.     Heart sounds: Normal heart sounds. No murmur heard.   No friction rub. No gallop.  Pulmonary:     Effort: Pulmonary effort is normal. No respiratory distress.     Breath sounds: Normal breath sounds. No stridor. No wheezing, rhonchi or rales.  Musculoskeletal:        General: Normal range of motion.     Cervical back: Normal range of motion.  Skin:    General: Skin is warm and dry.     Capillary Refill: Capillary refill takes less than 2 seconds.  Neurological:     General: No focal deficit present.     Mental Status: She is alert and oriented to person, place, and time. Mental status is at baseline.  Psychiatric:        Mood and Affect: Mood normal.        Behavior: Behavior normal.        Thought Content: Thought content normal.        Judgment: Judgment normal.    Lab Results  Component Value Date   TSH 1.41 05/02/2017   Lab Results  Component Value Date   WBC 10.3 04/29/2020   HGB 13.3 04/29/2020   HCT 41.8 04/29/2020   MCV 83 04/29/2020   PLT 256 04/29/2020   Lab Results  Component Value Date   NA 142 04/29/2020   K 3.8 04/29/2020   CO2 20 04/29/2020   GLUCOSE 97 04/29/2020   BUN 17 04/29/2020   CREATININE 0.89 04/29/2020  BILITOT 0.3 04/29/2020   ALKPHOS 102 04/29/2020   AST 14 04/29/2020   ALT 14 04/29/2020   PROT 6.9 04/29/2020   ALBUMIN 3.9 04/29/2020   CALCIUM 9.5 04/29/2020   ANIONGAP 9 08/24/2019   EGFR 72 04/29/2020   Lab Results  Component Value Date   CHOL 144 04/29/2020   Lab Results  Component Value Date   HDL 52 04/29/2020   Lab Results  Component Value Date   LDLCALC 62 04/29/2020   Lab Results  Component Value Date   TRIG 181 (H) 04/29/2020   Lab Results  Component  Value Date   CHOLHDL 2.8 04/29/2020   Lab Results  Component Value Date   HGBA1C 8.1 (H) 07/29/2020

## 2020-07-29 NOTE — Telephone Encounter (Signed)
Please schedule 30 min pharmacy clinic appt  She can do telephone vs in person  Thank you!

## 2020-07-29 NOTE — Telephone Encounter (Signed)
Appt made

## 2020-07-31 ENCOUNTER — Encounter: Payer: Self-pay | Admitting: Family Medicine

## 2020-08-02 NOTE — Progress Notes (Signed)
Rr sent

## 2020-08-20 ENCOUNTER — Other Ambulatory Visit: Payer: Self-pay | Admitting: Family Medicine

## 2020-08-23 ENCOUNTER — Other Ambulatory Visit: Payer: Self-pay | Admitting: Family Medicine

## 2020-08-25 ENCOUNTER — Other Ambulatory Visit: Payer: Self-pay | Admitting: Family Medicine

## 2020-08-25 NOTE — Telephone Encounter (Signed)
  Prescription Request  08/25/2020  Is this a "Controlled Substance" medicine?no Have you seen your PCP in the last 2 weeks?last seen 07/29/20 If YES, route message to pool  -  If NO, patient needs to be seen.  What is the name of the medication or equipment?backlofen  Have you contacted your pharmacy to request a refill? no  Which pharmacy would you like this sent to?[facility]   Patient notified that their request is being sent to the clinical staff for review and that they should receive a response within 2 business days.

## 2020-08-26 ENCOUNTER — Other Ambulatory Visit: Payer: Self-pay | Admitting: Family Medicine

## 2020-08-26 DIAGNOSIS — I10 Essential (primary) hypertension: Secondary | ICD-10-CM

## 2020-08-27 MED ORDER — BACLOFEN 10 MG PO TABS: 10.0000 mg | ORAL_TABLET | Freq: Three times a day (TID) | ORAL | 2 refills | Status: DC | PRN

## 2020-08-31 ENCOUNTER — Other Ambulatory Visit: Payer: Self-pay

## 2020-08-31 ENCOUNTER — Ambulatory Visit
Admission: RE | Admit: 2020-08-31 | Discharge: 2020-08-31 | Disposition: A | Discharge status: Home / Self Care | Payer: 59 | Source: Ambulatory Visit | Attending: Family Medicine | Admitting: Family Medicine

## 2020-08-31 DIAGNOSIS — Z1231 Encounter for screening mammogram for malignant neoplasm of breast: Secondary | ICD-10-CM

## 2020-09-08 ENCOUNTER — Ambulatory Visit (INDEPENDENT_AMBULATORY_CARE_PROVIDER_SITE_OTHER): Payer: 59 | Admitting: Pharmacist

## 2020-09-08 ENCOUNTER — Other Ambulatory Visit: Payer: Self-pay

## 2020-09-08 DIAGNOSIS — E1165 Type 2 diabetes mellitus with hyperglycemia: Secondary | ICD-10-CM | POA: Diagnosis not present

## 2020-09-08 NOTE — Progress Notes (Signed)
    09/08/2020 Name: Lyana Asbill MRN: [id number] DOB: [birth year]  S:  44 yoF Presents for diabetes evaluation, education, and management Patient was referred and last seen by Primary Care Provider on 07/11/20   Insurance coverage/medication affordability: [employer]   Patient reports adherence with medications. Current diabetes medications include: novolin 70/30, LEVEMIR, metformin, JANUVIA, FARXIGA Current hypertension medications include:dilt, metop, lisinopril, triam/hctz, spiro Goal 130/80 Current hyperlipidemia medications include: rosuvastatin   Patient denies hypoglycemic events.   Patient reported dietary habits: Eats 2-3 meals/day Discussed meal planning options and Plate method for healthy eating Avoid sugary drinks and desserts Incorporate balanced protein, non starchy veggies, 1 serving of carbohydrate with each meal Increase water intake Increase physical activity as able   Patient-reported exercise habits: uses cane, needs knee replacement   O:  Lab Results  Component Value Date   HGBA1C 8.1 (H) 07/29/2020   Lipid Panel     Component Value Date/Time   CHOL 144 04/29/2020 1401   TRIG 181 (H) 04/29/2020 1401   HDL 52 04/29/2020 1401   CHOLHDL 2.8 04/29/2020 1401   CHOLHDL 3.8 05/02/2017 1617   LDLCALC 62 04/29/2020 1401   LDLCALC 111 (H) 05/02/2017 1617     Home fasting blood sugars: 200s  2 hour post-meal/random blood sugars: 200-300.    Clinical Atherosclerotic Cardiovascular Disease (ASCVD): No   The 10-year ASCVD risk score (Arnett DK, et al., 2019) is: 7.6%   Values used to calculate the score:     Age: 65 years     Sex: Female     Is Non-Hispanic African American: No     Diabetic: Yes     Tobacco smoker: No     Systolic Blood Pressure: 107 mmHg     Is BP treated: Yes     HDL Cholesterol: 52 mg/dL     Total Cholesterol: 144 mg/dL    A/P:  Diabetes V0JJ currently UNCONTROLLED.  Patient is able to verbalize appropriate hypoglycemia  management plan. Patient is adherent with medication. Control is suboptimal due to INSURANCE COVERAGE/DIET  STOP CURRENT INSULIN REGIMEN  START TRESIBA 24 UNITS NIGHTLY (will have to increase--patient nervous about starting high doses, need to balance basal 50% with bolus 50%))  START FIASP 10-15 units 2-3 times daily with meals  Samples given of tresiba and fiasp--high deductible plan but patient to start medicare in JAN 2023  Continue other oral medications as above for diabetes  Will follow up with patient closely  -Extensively discussed pathophysiology of diabetes, recommended lifestyle interventions, dietary effects on blood sugar control  -Counseled on s/sx of and management of hypoglycemia  -Next A1C anticipated 3 months.  Written patient instructions provided.  Total time in face to face counseling 30 minutes.    Kieth Brightly, PharmD, BCPS Clinical Pharmacist, [facility]  II Phone [phone]

## 2020-09-12 ENCOUNTER — Other Ambulatory Visit: Payer: Self-pay | Admitting: Family Medicine

## 2020-09-20 ENCOUNTER — Ambulatory Visit (INDEPENDENT_AMBULATORY_CARE_PROVIDER_SITE_OTHER): Payer: 59 | Admitting: Pharmacist

## 2020-09-20 DIAGNOSIS — E1165 Type 2 diabetes mellitus with hyperglycemia: Secondary | ICD-10-CM

## 2020-09-20 NOTE — Progress Notes (Signed)
     09/20/2020 Name: Sara Stuart MRN: [id number] DOB: 01-09-1955   SS:  29 yoF Presents for diabetes evaluation, education, and management Patient was referred and last seen by Primary Care Provider on 07/11/20   Insurance coverage/medication affordability: [employer]   Patient reports adherence with medications. Current diabetes medications include: novolin 70/30-30 units 3x daily with meals (previous regimen), TRESIBA 24 UNITS qHS, metformin, JANUVIA, FARXIGA Current hypertension medications include:dilt, metop, lisinopril, triam/hctz, spiro Goal 130/80 Current hyperlipidemia medications include: rosuvastatin   Patient denies hypoglycemic events.   Patient reported dietary habits: Eats 2-3 meals/day Discussed meal planning options and Plate method for healthy eating Avoid sugary drinks and desserts Incorporate balanced protein, non starchy veggies, 1 serving of carbohydrate with each meal Increase water intake Increase physical activity as able   Patient-reported exercise habits: uses cane, needs knee replacement      O:  Lab Results  Component Value Date   HGBA1C 8.1 (H) 07/29/2020   Lipid Panel     Component Value Date/Time   CHOL 144 04/29/2020 1401   TRIG 181 (H) 04/29/2020 1401   HDL 52 04/29/2020 1401   CHOLHDL 2.8 04/29/2020 1401   CHOLHDL 3.8 05/02/2017 1617   LDLCALC 62 04/29/2020 1401   LDLCALC 111 (H) 05/02/2017 1617     Home fasting blood sugars: now 120-140--was 400s with new insulin switch  2 hour post-meal/random blood sugars: n/a.    Clinical Atherosclerotic Cardiovascular Disease (ASCVD): No   The 10-year ASCVD risk score (Arnett DK, et al., 2019) is: 7.6%   Values used to calculate the score:     Age: 65 years     Sex: Female     Is Non-Hispanic African American: No     Diabetic: Yes     Tobacco smoker: No     Systolic Blood Pressure: 107 mmHg     Is BP treated: Yes     HDL Cholesterol: 52 mg/dL     Total Cholesterol: 144 mg/dL     A/P:  Diabetes Y0DX currently UNCONTROLLED.  Patient is able to verbalize appropriate hypoglycemia management plan. Patient is adherent with medication. Control is suboptimal due to INSURANCE COVERAGE/DIET  CONTINUE TRESIBA 24 UNITS NIGHTLY (will have to increase--patient nervous about starting high doses, need to balance basal 50% with bolus 50%)  Patient has reverted back to taking Novolin 70/30 --30 units 3x daily with meals.  She reports she is not eating the best  Will attempt to have patient come back in and try GLP1 (Rybelsus)--has GI issues with Trulicity, but retrial could be rewarding  Denies personal and family history of Medullary thyroid cancer (MTC)  Patient has discontinued Fiasp with meal times--she states it did not bring down her blood sugar at all  Samples given of tresiba --high deductible plan but patient to start medicare in JAN 2023   Continue other oral medications as above for diabetes   Will follow up with patient closely     Written patient instructions provided.  Total time in face to face counseling 25 minutes.   Follow up Pharmacist Clinic Visit ON 9/23.   Kieth Brightly, PharmD, BCPS Clinical Pharmacist, [facility]  II Phone [phone]

## 2020-09-27 ENCOUNTER — Other Ambulatory Visit: Payer: Self-pay | Admitting: Family Medicine

## 2020-09-28 ENCOUNTER — Other Ambulatory Visit: Payer: Self-pay | Admitting: Family Medicine

## 2020-10-14 ENCOUNTER — Other Ambulatory Visit: Payer: Self-pay | Admitting: Family Medicine

## 2020-10-14 DIAGNOSIS — E1165 Type 2 diabetes mellitus with hyperglycemia: Secondary | ICD-10-CM

## 2020-10-14 DIAGNOSIS — I10 Essential (primary) hypertension: Secondary | ICD-10-CM

## 2020-10-26 ENCOUNTER — Other Ambulatory Visit: Payer: Self-pay | Admitting: Family Medicine

## 2020-10-26 DIAGNOSIS — I10 Essential (primary) hypertension: Secondary | ICD-10-CM

## 2020-11-01 ENCOUNTER — Other Ambulatory Visit: Payer: Self-pay

## 2020-11-01 ENCOUNTER — Ambulatory Visit (INDEPENDENT_AMBULATORY_CARE_PROVIDER_SITE_OTHER): Payer: 59 | Admitting: Family Medicine

## 2020-11-01 ENCOUNTER — Encounter: Payer: Self-pay | Admitting: Family Medicine

## 2020-11-01 VITALS — BP 117/76 | HR 61 | Temp 97.5°F | Ht 61.0 in | Wt 207.0 lb

## 2020-11-01 DIAGNOSIS — E1165 Type 2 diabetes mellitus with hyperglycemia: Secondary | ICD-10-CM | POA: Diagnosis not present

## 2020-11-01 DIAGNOSIS — M549 Dorsalgia, unspecified: Secondary | ICD-10-CM

## 2020-11-01 DIAGNOSIS — I10 Essential (primary) hypertension: Secondary | ICD-10-CM | POA: Diagnosis not present

## 2020-11-01 DIAGNOSIS — Z23 Encounter for immunization: Secondary | ICD-10-CM | POA: Diagnosis not present

## 2020-11-01 DIAGNOSIS — I700 Atherosclerosis of aorta: Secondary | ICD-10-CM | POA: Insufficient documentation

## 2020-11-01 DIAGNOSIS — G8929 Other chronic pain: Secondary | ICD-10-CM

## 2020-11-01 DIAGNOSIS — M79601 Pain in right arm: Secondary | ICD-10-CM

## 2020-11-01 DIAGNOSIS — E782 Mixed hyperlipidemia: Secondary | ICD-10-CM | POA: Diagnosis not present

## 2020-11-01 DIAGNOSIS — Z794 Long term (current) use of insulin: Secondary | ICD-10-CM

## 2020-11-01 LAB — CBC WITH DIFFERENTIAL/PLATELET
Basophils Absolute: 0.1 10*3/uL (ref 0.0–0.2)
Basos: 1 %
EOS (ABSOLUTE): 0.4 10*3/uL (ref 0.0–0.4)
Eos: 4 %
Hematocrit: 42.3 % (ref 34.0–46.6)
Hemoglobin: 13.3 g/dL (ref 11.1–15.9)
Immature Grans (Abs): 0 10*3/uL (ref 0.0–0.1)
Immature Granulocytes: 0 %
Lymphocytes Absolute: 1.9 10*3/uL (ref 0.7–3.1)
Lymphs: 21 %
MCH: 25.6 pg — ABNORMAL LOW (ref 26.6–33.0)
MCHC: 31.4 g/dL — ABNORMAL LOW (ref 31.5–35.7)
MCV: 82 fL (ref 79–97)
Monocytes Absolute: 0.5 10*3/uL (ref 0.1–0.9)
Monocytes: 6 %
Neutrophils Absolute: 6.1 10*3/uL (ref 1.4–7.0)
Neutrophils: 68 %
Platelets: 306 10*3/uL (ref 150–450)
RBC: 5.19 x10E6/uL (ref 3.77–5.28)
RDW: 13.3 % (ref 11.7–15.4)
WBC: 9 10*3/uL (ref 3.4–10.8)

## 2020-11-01 LAB — CMP14+EGFR
ALT: 15 IU/L (ref 0–32)
AST: 17 IU/L (ref 0–40)
Albumin/Globulin Ratio: 1.5 (ref 1.2–2.2)
Albumin: 4.2 g/dL (ref 3.8–4.8)
Alkaline Phosphatase: 80 IU/L (ref 44–121)
BUN/Creatinine Ratio: 23 (ref 12–28)
BUN: 22 mg/dL (ref 8–27)
Bilirubin Total: 0.3 mg/dL (ref 0.0–1.2)
CO2: 23 mmol/L (ref 20–29)
Calcium: 9.6 mg/dL (ref 8.7–10.3)
Chloride: 100 mmol/L (ref 96–106)
Creatinine, Ser: 0.96 mg/dL (ref 0.57–1.00)
Globulin, Total: 2.8 g/dL (ref 1.5–4.5)
Glucose: 223 mg/dL — ABNORMAL HIGH (ref 70–99)
Potassium: 3.9 mmol/L (ref 3.5–5.2)
Sodium: 138 mmol/L (ref 134–144)
Total Protein: 7 g/dL (ref 6.0–8.5)
eGFR: 66 mL/min/{1.73_m2} (ref >59)

## 2020-11-01 LAB — LIPID PANEL
Chol/HDL Ratio: 2.7 ratio (ref 0.0–4.4)
Cholesterol, Total: 151 mg/dL (ref 100–199)
HDL: 56 mg/dL (ref >39)
LDL Chol Calc (NIH): 74 mg/dL (ref 0–99)
Triglycerides: 121 mg/dL (ref 0–149)
VLDL Cholesterol Cal: 21 mg/dL (ref 5–40)

## 2020-11-01 LAB — BAYER DCA HB A1C WAIVED: HB A1C (BAYER DCA - WAIVED): 7.8 % — ABNORMAL HIGH (ref 4.8–5.6)

## 2020-11-01 MED ORDER — METFORMIN HCL ER 500 MG PO TB24: 1000.0000 mg | ORAL_TABLET | Freq: Two times a day (BID) | ORAL | 1 refills | Status: DC

## 2020-11-01 MED ORDER — LISINOPRIL 2.5 MG PO TABS: 2.5000 mg | ORAL_TABLET | Freq: Every day | ORAL | 1 refills | Status: DC

## 2020-11-01 MED ORDER — BACLOFEN 10 MG PO TABS: 10.0000 mg | ORAL_TABLET | Freq: Three times a day (TID) | ORAL | 2 refills | Status: DC | PRN

## 2020-11-01 MED ORDER — DAPAGLIFLOZIN PROPANEDIOL 10 MG PO TABS: 10.0000 mg | ORAL_TABLET | Freq: Every day | ORAL | 1 refills | Status: DC

## 2020-11-01 NOTE — Progress Notes (Signed)
Assessment & Plan:  1. Uncontrolled type 2 diabetes mellitus with hyperglycemia, with long-term current use of insulin (HCC) Lab Results  Component Value Date   HGBA1C 7.8 (H) 11/01/2020   HGBA1C 8.1 (H) 07/29/2020   HGBA1C 8.4 (H) 04/29/2020    - Diabetes is not at goal of A1c < 7. - Medications: continue current medications with an increase in [place] from 24 to 26 units. She is agreeable to Rybelsus, but will need to be transitioned off Januvia.  - Home glucose monitoring: continue monitoring. Will order a freestyle libre once she is on Medicare. - Patient is currently taking a statin. Patient is taking an ACE-inhibitor/ARB.  - Instruction/counseling given: discussed foot care  Diabetes Health Maintenance Due  Topic Date Due   OPHTHALMOLOGY EXAM  10/24/2018   HEMOGLOBIN A1C  05/01/2021   FOOT EXAM  11/01/2021    Lab Results  Component Value Date   LABMICR <3.0 11/01/2020   LABMICR <3.0 09/09/2019   MICROALBUR 1.8 05/02/2017    - Lipid panel - CBC with Differential/Platelet - CMP14+EGFR - Bayer DCA Hb A1c Waived - dapagliflozin propanediol (FARXIGA) 10 MG TABS tablet; Take 1 tablet (10 mg total) by mouth daily before breakfast.  Dispense: 90 tablet; Refill: 1 - metFORMIN (GLUCOPHAGE-XR) 500 MG 24 hr tablet; Take 2 tablets (1,000 mg total) by mouth 2 (two) times daily.  Dispense: 360 tablet; Refill: 1 - Microalbumin / creatinine urine ratio - Pneumococcal conjugate vaccine 20-valent (Prevnar 20)  2. Essential hypertension, benign Well controlled on current regimen.  - Lipid panel - CBC with Differential/Platelet - CMP14+EGFR - lisinopril (ZESTRIL) 2.5 MG tablet; Take 1 tablet (2.5 mg total) by mouth daily.  Dispense: 90 tablet; Refill: 1  3. Mixed hyperlipidemia Labs to assess. - Lipid panel - CMP14+EGFR  4. Aortic atherosclerosis (HCC) Continue statin. - Lipid panel - CMP14+EGFR  5. Pain of right upper extremity Advised patient to return for pain  management.  - baclofen (LIORESAL) 10 MG tablet; Take 1 tablet (10 mg total) by mouth 3 (three) times daily as needed.  Dispense: 60 each; Refill: 2  6. Chronic bilateral back pain, unspecified back location Advised patient to return for pain management.  - baclofen (LIORESAL) 10 MG tablet; Take 1 tablet (10 mg total) by mouth 3 (three) times daily as needed.  Dispense: 60 each; Refill: 2  7. Immunization due - Pneumococcal conjugate vaccine 20-valent (Prevnar 20)   Return in about 6 weeks (around 12/13/2020) for DM with Almyra Free; as soon patient is able for pain; AND 3 months with me for CPE.  Hendricks Limes, MSN, APRN, FNP-C [facility]  Subjective:    Patient ID: Sara Stuart, female    DOB: [birth year], 65 y.o.   MRN: [id number]  Patient Care Team: Loman Brooklyn, FNP as PCP - General (Family Medicine) Lavera Guise, [facility] as Pharmacist (Family Medicine) Celestia Khat, [place] (Optometry)   Chief Complaint:  Chief Complaint  Patient presents with   Diabetes    3 month follow up     HPI: Sara Stuart is a 64 y.o. female presenting on 11/01/2020 for Diabetes (3 month follow up )  Diabetes with hypertension and hyperlipidemia: Patient presents for follow up of diabetes. Current symptoms include: hyperglycemia. Known diabetic complications: cardiovascular disease. Medication compliance: Yes,patient last saw our clinical pharmacist on 09/20/2020 at which time she mentioned a trial of Rybelsus (GI issues with Trulicity). She was also given a sample of [place]. She will need to be  given samples until she goes on Medicare in January 2023. Current diet: in general, an "unhealthy" diet. Current exercise: walking. Home blood sugar records:  she is checking fasting, before lunch, before dinner, and at bedtime. Fasting ranges 65-325 with 9/35 <150; before lunch ranges 74-430 with 25/34 <180; before dinner ranges 64-322 with 19/33 <180; bedtime ranges 83-366 with 15/33 <180 . Is  she  on ACE inhibitor or angiotensin II receptor blocker? Yes (Lisinopril). Is she on a statin? Yes (Rosuvastatin).   New complaints: Patient reports right arm pain that occurs in her upper arm and radiates down to her hand. This has been going on for 3-4 months now. She has tried taking Baclofen, Ibuprofen, Tylenol, and Biofreeze. She is taking Baclofen at night just to get to sleep due to the pain. She had an x-ray completed of the arm in June, which was negative.  She also has chronic back pain.   Social history:  Relevant past medical, surgical, family and social history reviewed and updated as indicated. Interim medical history since our last visit reviewed.  Allergies and medications reviewed and updated.  DATA REVIEWED: CHART IN EPIC  ROS: Negative unless specifically indicated above in HPI.    Current Outpatient Medications:    aspirin EC 81 MG tablet, Take 1 tablet (81 mg total) by mouth daily., Disp: 30 tablet, Rfl:    baclofen (LIORESAL) 10 MG tablet, Take 1 tablet (10 mg total) by mouth 3 (three) times daily as needed., Disp: 60 each, Rfl: 2   dapagliflozin propanediol (FARXIGA) 10 MG TABS tablet, Take 1 tablet (10 mg total) by mouth daily before breakfast., Disp: 90 tablet, Rfl: 1   DILT-XR 120 MG 24 hr capsule, Take 1 capsule by mouth once daily, Disp: 90 capsule, Rfl: 0   fluticasone (FLONASE) 50 MCG/ACT nasal spray, Place 2 sprays into both nostrils daily as needed., Disp: , Rfl:    glucose blood test strip, Test BS four times daily Dx E11.65, Disp: 400 each, Rfl: 3   ibuprofen (ADVIL) 800 MG tablet, TAKE 1 TABLET BY MOUTH EVERY 8 HOURS AS NEEDED FOR MODERATE PAIN, Disp: 90 tablet, Rfl: 1   insulin degludec (TRESIBA FLEXTOUCH) 100 UNIT/ML FlexTouch Pen, Inject 24 Units into the skin daily., Disp: , Rfl:    insulin NPH-regular Human (70-30) 100 UNIT/ML injection, Inject 30 Units into the skin with breakfast, with lunch, and with evening meal., Disp: , Rfl:    Insulin Pen  Needle (B-D ULTRAFINE III SHORT PEN) 31G X 8 MM MISC, 1 each by Does not apply route as directed., Disp: 100 each, Rfl: 3   JANUVIA 100 MG tablet, Take 1 tablet by mouth once daily, Disp: 90 tablet, Rfl: 0   lisinopril (ZESTRIL) 2.5 MG tablet, Take 1 tablet by mouth once daily, Disp: 90 tablet, Rfl: 0   metFORMIN (GLUCOPHAGE-XR) 500 MG 24 hr tablet, Take 2 tablets by mouth twice daily, Disp: 360 tablet, Rfl: 0   metoprolol tartrate (LOPRESSOR) 25 MG tablet, Take 1 tablet (25 mg total) by mouth daily., Disp: 90 tablet, Rfl: 1   nitroGLYCERIN (NITROSTAT) 0.4 MG SL tablet, Place 1 tablet (0.4 mg total) under the tongue every 5 (five) minutes as needed., Disp: 25 tablet, Rfl: 3   omeprazole (PRILOSEC) 40 MG capsule, Take 1 capsule (40 mg total) by mouth daily., Disp: 90 capsule, Rfl: 1   rosuvastatin (CRESTOR) 10 MG tablet, Take 1 tablet (10 mg total) by mouth daily., Disp: 90 tablet, Rfl: 3   spironolactone (  ALDACTONE) 25 MG tablet, Take 1/2 (one-half) tablet by mouth once daily, Disp: 45 tablet, Rfl: 0   traZODone (DESYREL) 50 MG tablet, TAKE 1 TABLET BY MOUTH AT BEDTIME, Disp: 90 tablet, Rfl: 1   triamterene-hydrochlorothiazide (DYAZIDE) 37.5-25 MG capsule, Take 1 capsule by mouth once daily, Disp: 90 capsule, Rfl: 0   Allergies  Allergen Reactions   Trulicity [Dulaglutide]     GI intolerance   Past Medical History:  Diagnosis Date   Arthritis    Chronic back pain    spondylolishthesis   DDD (degenerative disc disease), lumbosacral    Diabetes mellitus without complication (HCC) diagnosed in 02/2014   takes Metformin and Glipizide daily   GERD (gastroesophageal reflux disease)    takes Omeprazole daily   Gout    takes Colchicine daily as needed   Hypertension    takes Dyazide and Diltiazem daily   Nocturia    Polio    PONV (postoperative nausea and vomiting)    Wears glasses     Past Surgical History:  Procedure Laterality Date   BACK SURGERY  07/2012   bladder tacked       COLONOSCOPY     FOOT SURGERY     more than 5 surgeries on right foot related to polio   surgery for polio     states x 5    TOTAL ABDOMINAL HYSTERECTOMY  2002   TUBAL PREGNANCY   TUBAL LIGATION      Social History   Socioeconomic History   Marital status: Married    Spouse name: Not on file   Number of children: Not on file   Years of education: Not on file   Highest education level: Not on file  Occupational History   Not on file  Tobacco Use   Smoking status: Never   Smokeless tobacco: Never  Vaping Use   Vaping Use: Never used  Substance and Sexual Activity   Alcohol use: No   Drug use: No   Sexual activity: Yes    Birth control/protection: Surgical  Other Topics Concern   Not on file  Social History Narrative   Not on file   Social Determinants of Health   Financial Resource Strain: Not on file  Food Insecurity: Not on file  Transportation Needs: Not on file  Physical Activity: Not on file  Stress: Not on file  Social Connections: Not on file  Intimate Partner Violence: Not on file        Objective:    BP 117/76   Pulse 61   Temp (!) 97.5 F (36.4 C) (Temporal)   Ht _0  (1.549 m)   Wt 207 lb (93.9 kg)   SpO2 93%   BMI 39.11 kg/m   Wt Readings from Last 3 Encounters:  11/01/20 207 lb (93.9 kg)  07/29/20 206 lb (93.4 kg)  06/28/20 206 lb (93.4 kg)    Physical Exam Vitals reviewed.  Constitutional:      General: She is not in acute distress.    Appearance: Normal appearance. She is morbidly obese. She is not ill-appearing, toxic-appearing or diaphoretic.  HENT:     Head: Normocephalic and atraumatic.  Eyes:     General: No scleral icterus.       Right eye: No discharge.        Left eye: No discharge.     Conjunctiva/sclera: Conjunctivae normal.  Cardiovascular:     Rate and Rhythm: Normal rate and regular rhythm.     Heart sounds:  Normal heart sounds. No murmur heard.   No friction rub. No gallop.  Pulmonary:     Effort: Pulmonary  effort is normal. No respiratory distress.     Breath sounds: Normal breath sounds. No stridor. No wheezing, rhonchi or rales.  Musculoskeletal:        General: Normal range of motion.     Cervical back: Normal range of motion.  Skin:    General: Skin is warm and dry.     Capillary Refill: Capillary refill takes less than 2 seconds.  Neurological:     General: No focal deficit present.     Mental Status: She is alert and oriented to person, place, and time. Mental status is at baseline.  Psychiatric:        Mood and Affect: Mood normal.        Behavior: Behavior normal.        Thought Content: Thought content normal.        Judgment: Judgment normal.    Lab Results  Component Value Date   TSH 1.41 05/02/2017   Lab Results  Component Value Date   WBC 10.3 04/29/2020   HGB 13.3 04/29/2020   HCT 41.8 04/29/2020   MCV 83 04/29/2020   PLT 256 04/29/2020   Lab Results  Component Value Date   NA 142 04/29/2020   K 3.8 04/29/2020   CO2 20 04/29/2020   GLUCOSE 97 04/29/2020   BUN 17 04/29/2020   CREATININE 0.89 04/29/2020   BILITOT 0.3 04/29/2020   ALKPHOS 102 04/29/2020   AST 14 04/29/2020   ALT 14 04/29/2020   PROT 6.9 04/29/2020   ALBUMIN 3.9 04/29/2020   CALCIUM 9.5 04/29/2020   ANIONGAP 9 08/24/2019   EGFR 72 04/29/2020   Lab Results  Component Value Date   CHOL 144 04/29/2020   Lab Results  Component Value Date   HDL 52 04/29/2020   Lab Results  Component Value Date   LDLCALC 62 04/29/2020   Lab Results  Component Value Date   TRIG 181 (H) 04/29/2020   Lab Results  Component Value Date   CHOLHDL 2.8 04/29/2020   Lab Results  Component Value Date   HGBA1C 8.1 (H) 07/29/2020

## 2020-11-02 LAB — MICROALBUMIN / CREATININE URINE RATIO
Creatinine, Urine: 34 mg/dL
Microalb/Creat Ratio: <9 mg/g creat (ref 0–29)
Microalbumin, Urine: <3 ug/mL

## 2020-11-07 ENCOUNTER — Encounter: Payer: Self-pay | Admitting: Family Medicine

## 2020-11-08 ENCOUNTER — Telehealth: Payer: Self-pay | Admitting: Family Medicine

## 2020-11-08 NOTE — Telephone Encounter (Signed)
2 pens placed in fridge with name on it - bag  Pt aware

## 2020-11-16 ENCOUNTER — Other Ambulatory Visit: Payer: Self-pay | Admitting: Family Medicine

## 2020-11-16 DIAGNOSIS — I10 Essential (primary) hypertension: Secondary | ICD-10-CM

## 2020-11-21 ENCOUNTER — Other Ambulatory Visit: Payer: Self-pay | Admitting: Family Medicine

## 2020-12-01 ENCOUNTER — Telehealth: Payer: Self-pay | Admitting: Family Medicine

## 2020-12-01 ENCOUNTER — Other Ambulatory Visit: Payer: Self-pay | Admitting: Family

## 2020-12-01 DIAGNOSIS — E782 Mixed hyperlipidemia: Secondary | ICD-10-CM

## 2020-12-01 DIAGNOSIS — I2510 Atherosclerotic heart disease of native coronary artery without angina pectoris: Secondary | ICD-10-CM

## 2020-12-07 ENCOUNTER — Other Ambulatory Visit: Payer: Self-pay | Admitting: Internal Medicine

## 2020-12-07 NOTE — Telephone Encounter (Signed)
This is a [facility] pt.  °

## 2020-12-10 ENCOUNTER — Other Ambulatory Visit: Payer: Self-pay | Admitting: Family

## 2020-12-10 DIAGNOSIS — E782 Mixed hyperlipidemia: Secondary | ICD-10-CM

## 2020-12-10 DIAGNOSIS — I2510 Atherosclerotic heart disease of native coronary artery without angina pectoris: Secondary | ICD-10-CM

## 2020-12-15 ENCOUNTER — Other Ambulatory Visit: Payer: Self-pay | Admitting: Family

## 2020-12-15 ENCOUNTER — Ambulatory Visit (INDEPENDENT_AMBULATORY_CARE_PROVIDER_SITE_OTHER): Payer: 59 | Admitting: Pharmacist

## 2020-12-15 DIAGNOSIS — I2510 Atherosclerotic heart disease of native coronary artery without angina pectoris: Secondary | ICD-10-CM

## 2020-12-15 DIAGNOSIS — Z794 Long term (current) use of insulin: Secondary | ICD-10-CM | POA: Diagnosis not present

## 2020-12-15 DIAGNOSIS — E1165 Type 2 diabetes mellitus with hyperglycemia: Secondary | ICD-10-CM

## 2020-12-15 DIAGNOSIS — E782 Mixed hyperlipidemia: Secondary | ICD-10-CM

## 2020-12-15 NOTE — Progress Notes (Signed)
12/15/2020 Name: Sara Stuart     MRN: [id number]       DOB: 04-10-1955     S:  50 yoF Presents for diabetes evaluation, education, and management Patient was referred and last seen by Primary Care Provider on 07/11/20   Insurance coverage/medication affordability: [employer]   Patient reports adherence with medications. Current diabetes medications include: novolin 70/30-30 units 3x daily with meals (previous regimen), TRESIBA 26 UNITS qHS, metformin, JANUVIA, FARXIGA Current hypertension medications include:dilt, metop, lisinopril, triam/hctz, spiro Goal 130/80 Current hyperlipidemia medications include: rosuvastatin   Patient denies hypoglycemic events.   Patient reported dietary habits: Eats 2-3 meals/day Discussed meal planning options and Plate method for healthy eating Avoid sugary drinks and desserts Incorporate balanced protein, non starchy veggies, 1 serving of carbohydrate with each meal Increase water intake Increase physical activity as able   Patient-reported exercise habits: uses cane, needs knee replacement    O:  Lab Results  Component Value Date   HGBA1C 7.8 (H) 11/01/2020    Lipid Panel     Component Value Date/Time   CHOL 151 11/01/2020 1022   TRIG 121 11/01/2020 1022   HDL 56 11/01/2020 1022   CHOLHDL 2.7 11/01/2020 1022   CHOLHDL 3.8 05/02/2017 1617   LDLCALC 74 11/01/2020 1022   LDLCALC 111 (H) 05/02/2017 1617     Home fasting blood sugars: <200  2 hour post-meal/random blood sugars: 200-300.    Clinical Atherosclerotic Cardiovascular Disease (ASCVD): No   The 10-year ASCVD risk score (Arnett DK, et al., 2019) is: 9%   Values used to calculate the score:     Age: 65 years     Sex: Female     Is Non-Hispanic African American: No     Diabetic: Yes     Tobacco smoker: No     Systolic Blood Pressure: 117 mmHg     Is BP treated: Yes     HDL Cholesterol: 56 mg/dL     Total Cholesterol: 151 mg/dL    A/P: Diabetes J1BJ  currently UNCONTROLLED.  Patient is able to verbalize appropriate hypoglycemia management plan. Patient is adherent with medication. Control is suboptimal due to INSURANCE COVERAGE/DIET   CONTINUE TRESIBA 26 UNITS NIGHTLY (will have to increase--patient nervous about starting high doses, need to balance basal 50% with bolus 50%)   Patient has reverted back to taking Novolin 70/30 --20-30 units 3x daily with meals.  She reports she is not eating the best..  Continue Januvia, Farxiga and metformin as prescribed    -Will switch to [facility] medicare next month 01/01/21 We can complete PAPs at that time if needed Plan to transition patient to tresiba and humalog (d/c 70/30) Combine sgtl2/DPP4 vs GLP1 Will attempt to have patient come back in and try GLP1 (Rybelsus)--has GI issues with Trulicity, but retrial could be rewarding-->Denies personal and family history of Medullary thyroid cancer (MTC)  -Patient interested in [place] 2 CGM Will need diabetes supplies sent to mail order ([facility]) once medicare kicks in   Patient has discontinued Fiasp with meal times--she states it did not bring down her blood sugar at all   Samples given of tresiba --high deductible plan but patient to start medicare in JAN 2023   Continue other oral medications as above for diabetes      Written patient instructions provided.  Total time in face to face counseling 25 minutes.    Follow up Pharmacist Clinic Visit ON 01/04/21.    Raynelle Fanning  Reyes Ivan, PharmD, BCPS Clinical Pharmacist, [place]  II Phone [phone]

## 2021-01-04 ENCOUNTER — Ambulatory Visit: Payer: 59 | Admitting: Pharmacist

## 2021-01-04 VITALS — BP 119/76

## 2021-01-04 DIAGNOSIS — E782 Mixed hyperlipidemia: Secondary | ICD-10-CM

## 2021-01-04 DIAGNOSIS — E1165 Type 2 diabetes mellitus with hyperglycemia: Secondary | ICD-10-CM

## 2021-01-04 MED ORDER — TRESIBA FLEXTOUCH 100 UNIT/ML ~~LOC~~ SOPN: 26.0000 [IU] | PEN_INJECTOR | Freq: Every day | SUBCUTANEOUS | 3 refills | Status: DC

## 2021-01-04 MED ORDER — HUMULIN 70/30 KWIKPEN (70-30) 100 UNIT/ML ~~LOC~~ SUPN: PEN_INJECTOR | SUBCUTANEOUS | 11 refills | Status: DC

## 2021-01-04 NOTE — Progress Notes (Signed)
Chronic Care Management Pharmacy Note  01/04/2021 Name:  Sara Stuart MRN:  [id number] DOB:  06/30/1955  Summary: T2DM, HLD, HTN  Recommendations/Changes made from today's visit:  Diabetes: New goal. Uncontrolled-A1C 7.8%, glucose continues to increase (>200s at most times of day); current treatment: Tresiba 26 units, Humulin 70/30 mix pen (3o units with each meal), Januvia 142m, Farxiga 117m metformin; Patient's regimen was based on financial constraints and bright health insurance-->now on medicare, so we will do complete overhaul of current diabetes regimen Increase Tresiba to 28 units Work on basal/bolus ratio and work to introduce GLP1 to cut meal time insulin as able Continue Farxiga , Januvia, metformin Patient would like to go on Humulin 70/30 mix pen since her husband is on that regimen, however will readdress; not ideal (concern for hypoglycemia/overlap with mix+basal+other meds) Noted intolerance to Trulicity (may consider Rybelsus, low dose ozempic, mounjaro to decrease insulin requirements) Denies personal and family history of Medullary thyroid cancer (MTC)  Current glucose readings: fasting glucose: >200s, post prandial glucose: 200-300s Denies hypoglycemic/hyperglycemic symptoms The patient is asked to make an attempt to improve diet and exercise patterns to aid in medical management of this problem. Discussed meal planning options and Plate method for healthy eating Avoid sugary drinks and desserts Incorporate balanced protein, non starchy veggies, 1 serving of carbohydrate with each meal Increase water intake Increase physical activity as able Current exercise: n/a-->encouraged Recommended heart healthy diet, healthy plate method handout given; work on transition DM regimen Assessed patient finances. Will enroll in patient assistance for Farxiga, insulin, and future GLP1  Hyperlipidemia:  New goal. Uncontrolled-LDL 78 current treatment: rosuvastatin 1065maily;   Medications previously tried: n/a  Current dietary patterns: heart healthy diet, healthy plate method handout given Counseled on diet, may increase statin if not at goal with A1c control and lifestyle changes  Hypertension: Patient at goal <130/80 BP home report-ranges from 110s-130s/65-80 Continue current regimen--on ACEi  Patient Goals/Self-Care Activities patient will:  - take medications as prescribed as evidenced by patient report and record review check glucose DAILY OR IF SYMPTOMATIC, document, and provide at future appointments collaborate with provider on medication access solutions target a minimum of 150 minutes of moderate intensity exercise weekly engage in dietary modifications by FOLLOWING HEALTHY PLATE METHOD/HEART HEALTHY DIET  Plan: F/U 01/12/21 IN PERSON  Subjective: GraAntasia Stuart an [age]. year old female who is a primary patient of JoyLoman BrooklynNP.  The CCM team was consulted for assistance with disease management and care coordination needs.    Engaged with patient by telephone for initial visit in response to provider referral for pharmacy case management and/or care coordination services.   Consent to Services:  The patient was given the following information about Chronic Care Management services today, agreed to services, and gave verbal consent: 1. CCM service includes personalized support from designated clinical staff supervised by the primary care provider, including individualized plan of care and coordination with other care providers 2. 24/7 contact phone numbers for assistance for urgent and routine care needs. 3. Service will only be billed when office clinical staff spend 20 minutes or more in a month to coordinate care. 4. Only one practitioner may furnish and bill the service in a calendar month. 5.The patient may stop CCM services at any time (effective at the end of the month) by phone call to the office staff. 6. The patient will be  responsible for cost sharing (co-pay) of up to 20% of the service  fee (after annual deductible is met). Patient agreed to services and consent obtained.  Patient Care Team: Loman Brooklyn, FNP as PCP - General (Family Medicine) Lavera Guise, [facility] as Pharmacist (Family Medicine) Celestia Khat, OD (Optometry)  Objective:  Lab Results  Component Value Date   CREATININE 0.96 11/01/2020   CREATININE 0.89 04/29/2020   CREATININE 0.94 12/18/2019    Lab Results  Component Value Date   HGBA1C 7.8 (H) 11/01/2020   Last diabetic Eye exam:  Lab Results  Component Value Date/Time   HMDIABEYEEXA No Retinopathy 06/23/2020 12:00 AM    Last diabetic Foot exam: No results found for: HMDIABFOOTEX      Component Value Date/Time   CHOL 151 11/01/2020 1022   TRIG 121 11/01/2020 1022   HDL 56 11/01/2020 1022   CHOLHDL 2.7 11/01/2020 1022   CHOLHDL 3.8 05/02/2017 1617   LDLCALC 74 11/01/2020 1022   LDLCALC 111 (H) 05/02/2017 1617    Hepatic Function Latest Ref Rng & Units 11/01/2020 04/29/2020 12/18/2019  Total Protein 6.0 - 8.5 g/dL 7.0 6.9 6.8  Albumin 3.8 - 4.8 g/dL 4.2 3.9 4.2  AST 0 - 40 IU/L '17 14 18  ' ALT 0 - 32 IU/L '15 14 20  ' Alk Phosphatase 44 - 121 IU/L 80 102 95  Total Bilirubin 0.0 - 1.2 mg/dL 0.3 0.3 0.3  Bilirubin, Direct 0.0 - 0.3 mg/dL - - -    Lab Results  Component Value Date/Time   TSH 1.41 05/02/2017 04:17 PM   FREET4 1.1 05/02/2017 04:17 PM    CBC Latest Ref Rng & Units 11/01/2020 04/29/2020 12/18/2019  WBC 3.4 - 10.8 x10E3/uL 9.0 10.3 8.9  Hemoglobin 11.1 - 15.9 g/dL 13.3 13.3 12.6  Hematocrit 34.0 - 46.6 % 42.3 41.8 38.8  Platelets 150 - 450 x10E3/uL 306 256 288    Lab Results  Component Value Date/Time   VD25OH 22 (L) 05/02/2017 04:17 PM    Clinical ASCVD: No  The 10-year ASCVD risk score (Arnett DK, et al., 2019) is: 9.3%   Values used to calculate the score:     Age: 81 years     Sex: Female     Is Non-Hispanic African American: No      Diabetic: Yes     Tobacco smoker: No     Systolic Blood Pressure: 627 mmHg     Is BP treated: Yes     HDL Cholesterol: 56 mg/dL     Total Cholesterol: 151 mg/dL    Other: (CHADS2VASc if Afib, PHQ9 if depression, MMRC or CAT for COPD, ACT, DEXA)  Social History   Tobacco Use  Smoking Status Never  Smokeless Tobacco Never   BP Readings from Last 3 Encounters:  01/08/21 119/76  11/01/20 117/76  07/29/20 107/65   Pulse Readings from Last 3 Encounters:  11/01/20 61  07/29/20 61  06/28/20 62   Wt Readings from Last 3 Encounters:  11/01/20 207 lb (93.9 kg)  07/29/20 206 lb (93.4 kg)  06/28/20 206 lb (93.4 kg)    Assessment: Review of patient past medical history, allergies, medications, health status, including review of consultants reports, laboratory and other test data, was performed as part of comprehensive evaluation and provision of chronic care management services.   SDOH:  (Social Determinants of Health) assessments and interventions performed:    CCM Care Plan  Allergies  Allergen Reactions   Trulicity [Dulaglutide]     GI intolerance    Medications Reviewed Today     Reviewed  by Lavera Guise, [facility] (Pharmacist) on 01/08/21 at 2237  Med List Status: <None>   Medication Order Taking? Sig Documenting Provider Last Dose Status Informant  aspirin EC 81 MG tablet [id number] No Take 1 tablet (81 mg total) by mouth daily. Orson Eva, MD Taking Active Self           Med Note [facility]   Thu Mar 14, 2017  2:26 PM)    baclofen (LIORESAL) 10 MG tablet [id number]  Take 1 tablet (10 mg total) by mouth 3 (three) times daily as needed. Loman Brooklyn, FNP  Active   dapagliflozin propanediol (FARXIGA) 10 MG TABS tablet [id number]  Take 1 tablet (10 mg total) by mouth daily before breakfast. Loman Brooklyn, FNP  Active   DILT-XR 120 MG 24 hr capsule [id number]  Take 1 capsule by mouth once daily Loman Brooklyn, FNP  Active   fluticasone [facility]) 50 MCG/ACT nasal  spray [id number] No Place 2 sprays into both nostrils daily as needed. [provider] Taking Active   glucose blood test strip [id number] No Test BS four times daily Dx E11.65 Ronnie Doss M, DO Taking Active   ibuprofen (ADVIL) 800 MG tablet [id number] No TAKE 1 TABLET BY MOUTH EVERY 8 HOURS AS NEEDED FOR MODERATE PAIN Loman Brooklyn, FNP Taking Active   insulin degludec (TRESIBA FLEXTOUCH) 100 UNIT/ML FlexTouch Pen [id number]  Inject 26 Units into the skin daily.  Patient taking differently: Inject 28 Units into the skin daily.   Loman Brooklyn, FNP  Active   insulin isophane & regular human KwikPen (HUMULIN 70/30 KWIKPEN) (70-30) 100 UNIT/ML KwikPen [id number] Yes Inject 30 Units into the skin with breakfast, with lunch, and with evening meal. Hendricks Limes F, FNP  Active   Insulin Pen Needle (B-D ULTRAFINE III SHORT PEN) 31G X 8 MM MISC [id number] No 1 each by Does not apply route as directed. Cassandria Anger, MD Taking Active Self  JANUVIA 100 MG tablet [id number]  Take 1 tablet by mouth once daily Loman Brooklyn, FNP  Active   lisinopril (ZESTRIL) 2.5 MG tablet [id number]  Take 1 tablet (2.5 mg total) by mouth daily. Loman Brooklyn, FNP  Active   metFORMIN (GLUCOPHAGE-XR) 500 MG 24 hr tablet [id number]  Take 2 tablets (1,000 mg total) by mouth 2 (two) times daily. Loman Brooklyn, FNP  Active   metoprolol tartrate (LOPRESSOR) 25 MG tablet [id number]  Take 1 tablet by mouth once daily Hendricks Limes F, FNP  Active   nitroGLYCERIN (NITROSTAT) 0.4 MG SL tablet [id number]  DISSOLVE ONE TABLET UNDER THE TONGUE EVERY 5 MINUTES AS NEEDED FOR CHEST PAIN.  DO NOT EXCEED A TOTAL OF 3 DOSES IN 15 MINUTES Fay Records, MD  Active   omeprazole (PRILOSEC) 40 MG capsule [id number]  Take 1 capsule by mouth once daily Loman Brooklyn, FNP  Active   rosuvastatin (CRESTOR) 10 MG tablet [id number]  Take 1 tablet by mouth once daily Loman Brooklyn, FNP  Active   spironolactone (ALDACTONE) 25 MG  tablet [id number]  Take 1/2 (one-half) tablet by mouth once daily Loman Brooklyn, FNP  Active   traZODone (DESYREL) 50 MG tablet [id number] No TAKE 1 TABLET BY MOUTH AT BEDTIME Loman Brooklyn, FNP Taking Active   triamterene-hydrochlorothiazide (DYAZIDE) 37.5-25 MG capsule [id number] No Take 1 capsule by mouth once daily Loman Brooklyn, FNP Taking Active             Patient  Active Problem List   Diagnosis Date Noted   Aortic atherosclerosis ([place]) 11/01/2020   Pain of right upper extremity 06/28/2020   Insomnia 12/18/2019   Coronary artery disease involving native coronary artery of native heart without angina pectoris 02/03/2019   Mixed hyperlipidemia 10/10/2017   Uncontrolled type 2 diabetes mellitus with hyperglycemia, with long-term current use of insulin ([place]) 03/14/2017   Essential hypertension, benign 03/14/2017   GERD (gastroesophageal reflux disease) 12/29/2012   History of gout 12/29/2012   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   Morbid obesity ([place]) 12/29/2012   Degenerative spondylolisthesis 07/08/2012   Spinal stenosis, lumbar region, with neurogenic claudication 07/08/2012    Immunization History  Administered Date(s) Administered   Influenza, High Dose Seasonal PF 10/22/2020   Influenza, Quadrivalent, Recombinant, Inj, Pf 09/18/2018   Influenza,inj,Quad PF,6+ Mos 09/21/2017   Influenza,inj,quad, With Preservative 01/05/2017, 09/21/2017   Influenza,trivalent, recombinat, inj, PF 10/02/2014   Influenza-Unspecified 09/18/2018, 10/02/2019, 10/27/2020   Moderna Sars-Covid-2 Vaccination 11/03/2019, 12/03/2019, 06/16/2020   PNEUMOCOCCAL CONJUGATE-20 11/01/2020   Pneumococcal Polysaccharide-23 09/10/2017   Tdap 01/07/2018    Conditions to be addressed/monitored: HTN, HLD, and DMII  Care Plan : PHARMD MEDICATION MANAGEMENT  Updates made by Lavera Guise, RPH since 01/08/2021 12:00 AM     Problem: DISEASE PROGRESSION PREVENTION      Long-Range Goal: T2DM,  HLD, HTN PHARMD GOAL   This Visit's Progress: Not on track  Priority: High  Note:   Current Barriers:  Unable to independently afford treatment regimen Unable to achieve control of T2DM  Suboptimal therapeutic regimen for T2DM   Pharmacist Clinical Goal(s):  patient will verbalize ability to afford treatment regimen achieve control of T2DM, HLD as evidenced by GOAL A1C, IMPROVED QUALITY OF LIFE, LDL<70 maintain control of T2DM, HLD as evidenced by GOAL A1C, IMPROVED QUALITY OF LIFE, LDL<70  adhere to plan to optimize therapeutic regimen for T2DM, HLD as evidenced by report of adherence to recommended medication management changes through collaboration with PharmD and provider.   Interventions: 1:1 collaboration with Loman Brooklyn, FNP regarding development and update of comprehensive plan of care as evidenced by provider attestation and co-signature Inter-disciplinary care team collaboration (see longitudinal plan of care) Comprehensive medication review performed; medication list updated in electronic medical record  Diabetes: New goal. Uncontrolled-A1C 7.8%, glucose continues to increase (>200s at most times of day); current treatment: Tresiba 26 units, Humulin 70/30 mix pen (3o units with each meal), Januvia 131m, Farxiga 124m metformin; Patient's regimen was based on financial constraints and bright health insurance-->now on medicare, so we will do complete overhaul of current diabetes regimen Increase Tresiba to 28 units Work on basal/bolus ratio and work to introduce GLP1 to cut meal time insulin as able Continue Farxiga , Januvia, metformin Patient would like to go on Humulin 70/30 mix pen since her husband is on that regimen, however will readdress; not ideal (concern for hypoglycemia/overlap with mix+basal+other meds) Noted intolerance to Trulicity (may consider Rybelsus, low dose ozempic, mounjaro to decrease insulin requirements) Denies personal and family history of  Medullary thyroid cancer (MTC)  Current glucose readings: fasting glucose: >200s, post prandial glucose: 200-300s Denies hypoglycemic/hyperglycemic symptoms The patient is asked to make an attempt to improve diet and exercise patterns to aid in medical management of this problem. Discussed meal planning options and Plate method for healthy eating Avoid sugary drinks and desserts Incorporate balanced protein, non starchy veggies, 1 serving of carbohydrate with each meal Increase water intake Increase physical activity as able Current  exercise: n/a-->encouraged Recommended heart healthy diet, healthy plate method handout given; work on transition DM regimen Assessed patient finances. Will enroll in patient assistance for Farxiga, insulin, and future GLP1  Hyperlipidemia:  New goal. Uncontrolled-LDL 78 current treatment: rosuvastatin 72m daily;  Medications previously tried: n/a  Current dietary patterns: heart healthy diet, healthy plate method handout given Counseled on diet, may increase statin if not at goal with A1c control and lifestyle changes  Hypertension: Patient at goal <130/80 BP home report-ranges from 110s-130s/65-80 Continue current regimen--on ACEi  Patient Goals/Self-Care Activities patient will:  - take medications as prescribed as evidenced by patient report and record review check glucose DAILY OR IF SYMPTOMATIC, document, and provide at future appointments collaborate with provider on medication access solutions target a minimum of 150 minutes of moderate intensity exercise weekly engage in dietary modifications by FOLLOWING HEALTHY PLATE METHOD/HEART HEALTHY DIET      Medication Assistance:  PENDING  [facility]  [facility] [address]Phone: [phone]: [phone] Follow Up:  Patient agrees to Care Plan and Follow-up.  Plan: Face to Face appointment  with care management team member scheduled for: 01/12/21  JRegina Eck PharmD, BCPS Clinical Pharmacist, [place] Phone [phone]

## 2021-01-05 ENCOUNTER — Telehealth: Payer: Self-pay | Admitting: Family Medicine

## 2021-01-05 DIAGNOSIS — E1165 Type 2 diabetes mellitus with hyperglycemia: Secondary | ICD-10-CM

## 2021-01-05 DIAGNOSIS — Z794 Long term (current) use of insulin: Secondary | ICD-10-CM

## 2021-01-05 NOTE — Telephone Encounter (Signed)
Left patient a detailed message

## 2021-01-05 NOTE — Telephone Encounter (Signed)
Please let patient know:  Sara Stuart samples left in the fridge for patient  Thanks!

## 2021-01-05 NOTE — Telephone Encounter (Signed)
Samples given of tresiba --high deductible plan but patient to start medicare in JAN 2023  Above was in your last note- is she referring to tresiba?

## 2021-01-06 ENCOUNTER — Other Ambulatory Visit: Payer: Self-pay | Admitting: Family Medicine

## 2021-01-06 DIAGNOSIS — E1165 Type 2 diabetes mellitus with hyperglycemia: Secondary | ICD-10-CM

## 2021-01-08 ENCOUNTER — Encounter: Payer: Self-pay | Admitting: Pharmacist

## 2021-01-08 NOTE — Patient Instructions (Addendum)
Visit Information  Thank you for taking time to visit with me today. Please don't hesitate to contact me if I can be of assistance to you before our next scheduled telephone appointment.  Following are the goals we discussed today:  Current Barriers:  Unable to independently afford treatment regimen Unable to achieve control of T2DM  Suboptimal therapeutic regimen for T2DM   Pharmacist Clinical Goal(s):  patient will verbalize ability to afford treatment regimen achieve control of T2DM, HLD as evidenced by GOAL A1C, IMPROVED QUALITY OF LIFE, LDL<70 maintain control of T2DM, HLD as evidenced by GOAL A1C, IMPROVED QUALITY OF LIFE, LDL<70  adhere to plan to optimize therapeutic regimen for T2DM, HLD as evidenced by report of adherence to recommended medication management changes through collaboration with PharmD and provider.   Interventions: 1:1 collaboration with Gwenlyn Fudge, FNP regarding development and update of comprehensive plan of care as evidenced by provider attestation and co-signature Inter-disciplinary care team collaboration (see longitudinal plan of care) Comprehensive medication review performed; medication list updated in electronic medical record  Diabetes: New goal. Uncontrolled-A1C 7.8%, glucose continues to increase (>200s at most times of day); current treatment: Tresiba 26 units, Humulin 70/30 mix pen (3o units with each meal), Januvia 100mg , Farxiga 10mg , metformin; Patient's regimen was based on financial constraints and bright health insurance-->now on medicare, so we will do complete overhaul of current diabetes regimen Increase Tresiba to 28 units Work on basal/bolus ratio and work to introduce GLP1 to cut meal time insulin as able Continue Farxiga , Januvia, metformin Patient would like to go on Humulin 70/30 mix pen since her husband is on that regimen, however will readdress; not ideal (concern for hypoglycemia/overlap with mix+basal+other meds) Noted  intolerance to Trulicity (may consider Rybelsus, low dose ozempic, mounjaro to decrease insulin requirements) Denies personal and family history of Medullary thyroid cancer (MTC)  Current glucose readings: fasting glucose: >200s, post prandial glucose: 200-300s Denies hypoglycemic/hyperglycemic symptoms The patient is asked to make an attempt to improve diet and exercise patterns to aid in medical management of this problem. Discussed meal planning options and Plate method for healthy eating Avoid sugary drinks and desserts Incorporate balanced protein, non starchy veggies, 1 serving of carbohydrate with each meal Increase water intake Increase physical activity as able Current exercise: n/a-->encouraged Recommended heart healthy diet, healthy plate method handout given; work on transition DM regimen Assessed patient finances. Will enroll in patient assistance for Farxiga, insulin, and future GLP1  Hyperlipidemia:  New goal. Uncontrolled-LDL 78 current treatment: rosuvastatin 10mg  daily;  Medications previously tried: n/a  Current dietary patterns: heart healthy diet, healthy plate method handout given Counseled on diet, may increase statin if not at goal with A1c control and lifestyle changes  Hypertension: Patient at goal <130/80 BP home report-ranges from 110s-130s/65-80 Continue current regimen--on ACEi  Patient Goals/Self-Care Activities patient will:  - take medications as prescribed as evidenced by patient report and record review check glucose DAILY OR IF SYMPTOMATIC, document, and provide at future appointments collaborate with provider on medication access solutions target a minimum of 150 minutes of moderate intensity exercise weekly engage in dietary modifications by FOLLOWING HEALTHY PLATE METHOD/HEART HEALTHY DIET   Please call the care guide team at [phone] if you need to cancel or reschedule your appointment.   The patient verbalized understanding of  instructions, educational materials, and care plan provided today and declined offer to receive copy of patient instructions, educational materials, and care plan.   Signature 02-19-1970, PharmD, BCPS  Clinical Pharmacist, [facility]  [facility] Phone [phone]

## 2021-01-09 NOTE — Telephone Encounter (Signed)
Pt needs samples of insulin isophane & regular human KwikPen (HUMULIN 70/30 KWIKPEN) (70-30) 100 UNIT/ML KwikPen.   She is not able to fill the rx at [facility]. She says the way the rx was sent in pt rx is $70 for one month. She normally pays $35 for one month. Pt aware julie is not here today. Please call back  Pt says that she came by last Friday for [place].

## 2021-01-10 ENCOUNTER — Telehealth: Payer: Self-pay | Admitting: Family Medicine

## 2021-01-10 DIAGNOSIS — E1165 Type 2 diabetes mellitus with hyperglycemia: Secondary | ICD-10-CM

## 2021-01-10 DIAGNOSIS — Z794 Long term (current) use of insulin: Secondary | ICD-10-CM

## 2021-01-10 MED ORDER — HUMULIN 70/30 KWIKPEN (70-30) 100 UNIT/ML ~~LOC~~ SUPN: PEN_INJECTOR | SUBCUTANEOUS | 11 refills | Status: DC

## 2021-01-10 NOTE — Telephone Encounter (Signed)
Pt said that her insurance denied insulin isophane & regular human KwikPen (HUMULIN 70/30 KWIKPEN) (70-30) 100 UNIT/ML KwikPen and she needs to talk to [place]. Please call back and advise.

## 2021-01-10 NOTE — Telephone Encounter (Signed)
[place] called stating that she spoke with pt regarding her Humalin Rx and says pt is saying that the Humalin disp is incorrect because PCP is supposed to be changing her dosage so that she can start taking 35 units.

## 2021-01-10 NOTE — Telephone Encounter (Signed)
I spoke to pt and she says her insurance will not cover the insulin unless the sig says take 35 units. Pt says she had a telephone call with you earlier today and you knew what she was referring to. Please advise or call pt back.

## 2021-01-10 NOTE — Telephone Encounter (Signed)
Corrected rx to 35 units 3x times  daily with meals

## 2021-01-10 NOTE — Telephone Encounter (Signed)
Rx called in for humulin 70/30 pen per patient request Appt scheduled with pharmd for 1/12

## 2021-01-12 ENCOUNTER — Ambulatory Visit (INDEPENDENT_AMBULATORY_CARE_PROVIDER_SITE_OTHER): Payer: Medicare Other | Admitting: Pharmacist

## 2021-01-12 DIAGNOSIS — E1165 Type 2 diabetes mellitus with hyperglycemia: Secondary | ICD-10-CM

## 2021-01-12 DIAGNOSIS — I10 Essential (primary) hypertension: Secondary | ICD-10-CM

## 2021-01-12 DIAGNOSIS — Z794 Long term (current) use of insulin: Secondary | ICD-10-CM

## 2021-01-12 NOTE — Progress Notes (Signed)
Chronic Care Management Pharmacy Note  01/12/2021 Name:  Sara Stuart MRN:  [id number] DOB:  1955/05/20  Summary: T2DM  Recommendations/Changes made from today's visit:  Diabetes: Goal on Track (progressing): YES. Uncontrolled-A1C 7.8%, glucose continues to increase (>200s at most times of day); current treatment: Tresiba 28 units, Humulin 70/30 mix pen (3o units with each meal), Januvia 164m, Farxiga 152m metformin; Patient's regimen was based on financial constraints and bright health insurance-->now on medicare, so we will do complete overhaul of current diabetes regimen Patient would like to go on Humulin 70/30 mix pen since her husband is on that regimen, however will readdress; not ideal (concern for hypoglycemia/overlap with mix+basal+other meds) Noted intolerance to Trulicity (may consider Rybelsus, low dose ozempic, mounjaro to decrease insulin requirements) Denies personal and family history of Medullary thyroid cancer (MTC)  Current glucose readings: fasting glucose: >200s, post prandial glucose: 200-300s Denies hypoglycemic/hyperglycemic symptoms The patient is asked to make an attempt to improve diet and exercise patterns to aid in medical management of this problem. Discussed meal planning options and Plate method for healthy eating Avoid sugary drinks and desserts Incorporate balanced protein, non starchy veggies, 1 serving of carbohydrate with each meal Increase water intake Increase physical activity as able Current exercise: n/a-->encouraged Recommended heart healthy diet, healthy plate method handout given; work on transition DM regimen Assessed patient finances. Will [place]easier to get), insulin  Hyperlipidemia:  Goal on Track (progressing): YES. Uncontrolled-LDL 78 current treatment: rosuvastatin 1056maily;  Medications previously tried: n/a  Current dietary patterns: heart healthy diet, healthy  plate method handout given Counseled on diet, may increase statin if not at goal with A1c control and lifestyle changes  Hypertension: Patient at goal <130/80 BP home report-ranges from 110s-130s/65-80 Continue current regimen--on ACEi  Patient Goals/Self-Care Activities patient will:  - take medications as prescribed as evidenced by patient report and record review check glucose DAILY OR IF SYMPTOMATIC, document, and provide at future appointments collaborate with provider on medication access solutions target a minimum of 150 minutes of moderate intensity exercise weekly engage in dietary modifications by FOLLOWING HEALTHY PLATE METHOD/HEART HEALTHY DIET  Plan: 1 MONTH  Subjective: Sara Stuart an [age]o. year old female who is a primary patient of JoyLoman BrooklynNP.  The CCM team was consulted for assistance with disease management and care coordination needs.    Engaged with patient face to face for follow up visit in response to provider referral for pharmacy case management and/or care coordination services.   Consent to Services:  The patient was given information about Chronic Care Management services, agreed to services, and gave verbal consent prior to initiation of services.  Please see initial visit note for detailed documentation.   Patient Care Team: JoyLoman BrooklynNP as PCP - General (Family Medicine) [facility] Pharmacist (Family Medicine) JohCelestia KhatD (Optometry)  Objective:  Lab Results  Component Value Date   CREATININE 0.96 11/01/2020   CREATININE 0.89 04/29/2020   CREATININE 0.94 12/18/2019    Lab Results  Component Value Date   HGBA1C 7.8 (H) 11/01/2020   Last diabetic Eye exam:  Lab Results  Component Value Date/Time   HMDIABEYEEXA No Retinopathy 06/23/2020 12:00 AM    Last diabetic Foot exam: No results found for: HMDIABFOOTEX      Component Value Date/Time   CHOL 151 11/01/2020 1022   TRIG 121 11/01/2020 1022    HDL  56 11/01/2020 1022   CHOLHDL 2.7 11/01/2020 1022   CHOLHDL 3.8 05/02/2017 1617   LDLCALC 74 11/01/2020 1022   LDLCALC 111 (H) 05/02/2017 1617    Hepatic Function Latest Ref Rng & Units 11/01/2020 04/29/2020 12/18/2019  Total Protein 6.0 - 8.5 g/dL 7.0 6.9 6.8  Albumin 3.8 - 4.8 g/dL 4.2 3.9 4.2  AST 0 - 40 IU/L _0 ALT 0 - 32 IU/L _1 Alk Phosphatase 44 - 121 IU/L 80 102 95  Total Bilirubin 0.0 - 1.2 mg/dL 0.3 0.3 0.3  Bilirubin, Direct 0.0 - 0.3 mg/dL - - -    Lab Results  Component Value Date/Time   TSH 1.41 05/02/2017 04:17 PM   FREET4 1.1 05/02/2017 04:17 PM    CBC Latest Ref Rng & Units 11/01/2020 04/29/2020 12/18/2019  WBC 3.4 - 10.8 x10E3/uL 9.0 10.3 8.9  Hemoglobin 11.1 - 15.9 g/dL 13.3 13.3 12.6  Hematocrit 34.0 - 46.6 % 42.3 41.8 38.8  Platelets 150 - 450 x10E3/uL 306 256 288    Lab Results  Component Value Date/Time   VD25OH 22 (L) 05/02/2017 04:17 PM    Clinical ASCVD: No  The 10-year ASCVD risk score (Arnett DK, et al., 2019) is: 9%   Values used to calculate the score:     Age: 47 years     Sex: Female     Is Non-Hispanic African American: No     Diabetic: Yes     Tobacco smoker: No     Systolic Blood Pressure: 856 mmHg     Is BP treated: Yes     HDL Cholesterol: 56 mg/dL     Total Cholesterol: 151 mg/dL    Other: (CHADS2VASc if Afib, PHQ9 if depression, MMRC or CAT for COPD, ACT, DEXA)  Social History   Tobacco Use  Smoking Status Never  Smokeless Tobacco Never   BP Readings from Last 3 Encounters:  01/08/21 119/76  11/01/20 117/76  07/29/20 107/65   Pulse Readings from Last 3 Encounters:  11/01/20 61  07/29/20 61  06/28/20 62   Wt Readings from Last 3 Encounters:  11/01/20 207 lb (93.9 kg)  07/29/20 206 lb (93.4 kg)  06/28/20 206 lb (93.4 kg)    Assessment: Review of patient past medical history, allergies, medications, health status, including review of consultants reports, laboratory and other test data, was  performed as part of comprehensive evaluation and provision of chronic care management services.   SDOH:  (Social Determinants of Health) assessments and interventions performed:    CCM Care Plan  Allergies  Allergen Reactions   Trulicity [Dulaglutide]     GI intolerance    Medications Reviewed Today     Reviewed by Ladean Raya, CMA (Certified Medical Assistant) on 01/17/21 at [place] List Status: <None>   Medication Order Taking? Sig Documenting Provider Last Dose Status Informant  aspirin EC 81 MG tablet [id number] Yes Take 1 tablet (81 mg total) by mouth daily. Orson Eva, MD Taking Active Self           Med Note [facility]   Thu Mar 14, 2017  2:26 PM)    baclofen (LIORESAL) 10 MG tablet [id number] Yes Take 1 tablet (10 mg total) by mouth 3 (three) times daily as needed. Loman Brooklyn, FNP Taking Active   blood glucose meter kit and supplies KIT [id number] Yes by Does not apply route daily as needed. Dispense based on patient and insurance preference. Use up  to four times daily as directed. [provider] Taking Active            Med Note Blanca Friend, Sherian Maroon Jan 12, 2021  2:56 PM) Ordered via advanced diabetes supply via parach[facility] portal  dapagliflozin propanediol (FARXIGA) 10 MG TABS tablet [id number] Yes Take 1 tablet (10 mg total) by mouth daily before breakfast. Loman Brooklyn, FNP Taking Active   DILT-XR 120 MG 24 hr capsule [id number] Yes Take 1 capsule by mouth once daily Loman Brooklyn, FNP Taking Active   fluticasone (FLONASE) 50 MCG/ACT nasal spray [id number] Yes Place 2 sprays into both nostrils daily as needed. [provider] Taking Active   glucose blood test strip [id number] Yes Test BS four times daily Dx E11.65 Janora Norlander, DO Taking Active   ibuprofen (ADVIL) 800 MG tablet [id number] Yes TAKE 1 TABLET BY MOUTH EVERY 8 HOURS AS NEEDED FOR MODERATE PAIN Loman Brooklyn, FNP Taking Active   insulin degludec (TRESIBA  FLEXTOUCH) 100 UNIT/ML FlexTouch Pen [id number] Yes Inject 26 Units into the skin daily.  Patient taking differently: Inject 28 Units into the skin daily.   Loman Brooklyn, FNP Taking Active   insulin isophane & regular human KwikPen (HUMULIN 70/30 KWIKPEN) (70-30) 100 UNIT/ML KwikPen [id number] Yes Inject 35 Units into the skin with breakfast, with lunch, and with evening meal. Loman Brooklyn, FNP Taking Active   Insulin Pen Needle (B-D ULTRAFINE III SHORT PEN) 31G X 8 MM MISC [id number] Yes 1 each by Does not apply route as directed. Cassandria Anger, MD Taking Active Self  lisinopril (ZESTRIL) 2.5 MG tablet [id number] Yes Take 1 tablet (2.5 mg total) by mouth daily. Loman Brooklyn, FNP Taking Active   metFORMIN (GLUCOPHAGE-XR) 500 MG 24 hr tablet [id number] Yes Take 2 tablets (1,000 mg total) by mouth 2 (two) times daily. Loman Brooklyn, FNP Taking Active   metoprolol tartrate (LOPRESSOR) 25 MG tablet [id number] Yes Take 1 tablet by mouth once daily Hendricks Limes F, FNP Taking Active   nitroGLYCERIN (NITROSTAT) 0.4 MG SL tablet [id number] Yes DISSOLVE ONE TABLET UNDER THE TONGUE EVERY 5 MINUTES AS NEEDED FOR CHEST PAIN.  DO NOT EXCEED A TOTAL OF 3 DOSES IN 15 MINUTES Fay Records, MD Taking Active   omeprazole (PRILOSEC) 40 MG capsule [id number] Yes Take 1 capsule by mouth once daily Loman Brooklyn, FNP Taking Active   rosuvastatin (CRESTOR) 10 MG tablet [id number] Yes Take 1 tablet by mouth once daily Loman Brooklyn, FNP Taking Active   saxagliptin HCl (ONGLYZA) 5 MG TABS tablet [id number] Yes Take 1 tablet (5 mg total) by mouth daily. Loman Brooklyn, FNP Taking Active   spironolactone (ALDACTONE) 25 MG tablet [id number] Yes Take 1/2 (one-half) tablet by mouth once daily Loman Brooklyn, FNP Taking Active   traZODone (DESYREL) 50 MG tablet [id number] Yes TAKE 1 TABLET BY MOUTH AT BEDTIME Loman Brooklyn, FNP Taking Active   triamterene-hydrochlorothiazide (DYAZIDE) 37.5-25 MG capsule  [id number] Yes Take 1 capsule by mouth once daily Loman Brooklyn, FNP Taking Active             Patient Active Problem List   Diagnosis Date Noted   Aortic atherosclerosis ([place]) 11/01/2020   Pain of right upper extremity 06/28/2020   Insomnia 12/18/2019   Coronary artery disease involving native coronary artery of native heart without angina pectoris 02/03/2019   Mixed hyperlipidemia 10/10/2017   Uncontrolled type 2 diabetes mellitus with hyperglycemia,  with long-term current use of insulin ([place]) 03/14/2017   Essential hypertension, benign 03/14/2017   GERD (gastroesophageal reflux disease) 12/29/2012   History of gout 12/29/2012   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   Morbid obesity ([place]) 12/29/2012   Degenerative spondylolisthesis 07/08/2012   Spinal stenosis, lumbar region, with neurogenic claudication 07/08/2012    Immunization History  Administered Date(s) Administered   Influenza, High Dose Seasonal PF 10/22/2020   Influenza, Quadrivalent, Recombinant, Inj, Pf 09/18/2018   Influenza,inj,Quad PF,6+ Mos 09/21/2017   Influenza,inj,quad, With Preservative 01/05/2017, 09/21/2017   Influenza,trivalent, recombinat, inj, PF 10/02/2014   Influenza-Unspecified 09/18/2018, 10/02/2019, 10/27/2020   Moderna Sars-Covid-2 Vaccination 11/03/2019, 12/03/2019, 06/16/2020   PNEUMOCOCCAL CONJUGATE-20 11/01/2020   Pneumococcal Polysaccharide-23 09/10/2017   Tdap 01/07/2018    Conditions to be addressed/monitored: HLD and DMII  Care Plan : PHARMD MEDICATION MANAGEMENT  Updates made by Lavera Guise, RPH since 01/17/2021 12:00 AM     Problem: DISEASE PROGRESSION PREVENTION      Long-Range Goal: T2DM, HLD, HTN PHARMD GOAL   Recent Progress: Not on track  Priority: High  Note:   Current Barriers:  Unable to independently afford treatment regimen Unable to achieve control of T2DM  Suboptimal therapeutic regimen for T2DM  Pharmacist Clinical Goal(s):  patient will  verbalize ability to afford treatment regimen achieve control of T2DM, HLD as evidenced by GOAL A1C, IMPROVED QUALITY OF LIFE, LDL<70 maintain control of T2DM, HLD as evidenced by GOAL A1C, IMPROVED QUALITY OF LIFE, LDL<70  adhere to plan to optimize therapeutic regimen for T2DM, HLD as evidenced by report of adherence to recommended medication management changes through collaboration with PharmD and provider.   Interventions: 1:1 collaboration with Loman Brooklyn, FNP regarding development and update of comprehensive plan of care as evidenced by provider attestation and co-signature Inter-disciplinary care team collaboration (see longitudinal plan of care) Comprehensive medication review performed; medication list updated in electronic medical record  Diabetes: Goal on Track (progressing): YES. Uncontrolled-A1C 7.8%, glucose continues to increase (>200s at most times of day); current treatment: Tresiba 28 units, Humulin 70/30 mix pen (3o units with each meal), Januvia 138m, Farxiga 161m metformin; Patient's regimen was based on financial constraints and bright health insurance-->now on medicare, so we will do complete overhaul of current diabetes regimen Patient would like to go on Humulin 70/30 mix pen since her husband is on that regimen, however will readdress; not ideal (concern for hypoglycemia/overlap with mix+basal+other meds) Noted intolerance to Trulicity (may consider Rybelsus, low dose ozempic, mounjaro to decrease insulin requirements) Denies personal and family history of Medullary thyroid cancer (MTC)  Current glucose readings: fasting glucose: >200s, post prandial glucose: 200-300s Denies hypoglycemic/hyperglycemic symptoms The patient is asked to make an attempt to improve diet and exercise patterns to aid in medical management of this problem. Discussed meal planning options and Plate method for healthy eating Avoid sugary drinks and desserts Incorporate balanced protein,  non starchy veggies, 1 serving of carbohydrate with each meal Increase water intake Increase physical activity as able Current exercise: n/a-->encouraged Recommended heart healthy diet, healthy plate method handout given; work on transition DM regimen Assessed patient finances. Will enroll in patient assistance for [place] for januvia-easier to get), insulin  Hyperlipidemia:  Goal on Track (progressing): YES. Uncontrolled-LDL 78 current treatment: rosuvastatin 1066maily;  Medications previously tried: n/a  Current dietary patterns: heart healthy diet, healthy plate method handout given Counseled on diet, may increase statin if not at goal with A1c control and lifestyle changes  Hypertension: Patient at goal <130/80 BP home report-ranges from 110s-130s/65-80 Continue current regimen--on ACEi  Patient Goals/Self-Care Activities patient will:  - take medications as prescribed as evidenced by patient report and record review check glucose DAILY OR IF SYMPTOMATIC, document, and provide at future appointments collaborate with provider on medication access solutions target a minimum of 150 minutes of moderate intensity exercise weekly engage in dietary modifications by FOLLOWING HEALTHY PLATE METHOD/HEART HEALTHY DIET      Medication Assistance: [place]. in process.  Anticipated assistance start date TBD.  See plan of care for additional detail.  Patient's preferred pharmacy is:  [facility] [address] [place] [address] [place] [address] Phone: [phone] Fax: [phone]  [facility] [place], [place] [address] [place] [address] Phone: [phone] Fax: [phone]   Follow Up:  Patient agrees to Care Plan and Follow-up.  Plan: Telephone follow up appointment with care management team member scheduled for:  1 MONTH    Regina Eck,  PharmD, BCPS Clinical Pharmacist, [place]  II Phone [phone]

## 2021-01-13 MED ORDER — SAXAGLIPTIN HCL 5 MG PO TABS: 5.0000 mg | ORAL_TABLET | Freq: Every day | ORAL | 4 refills | Status: DC

## 2021-01-13 MED ORDER — DAPAGLIFLOZIN PROPANEDIOL 10 MG PO TABS: 10.0000 mg | ORAL_TABLET | Freq: Every day | ORAL | 4 refills | Status: DC

## 2021-01-13 NOTE — Patient Instructions (Addendum)
Visit Information  Thank you for taking time to visit with me today. Please don't hesitate to contact me if I can be of assistance to you before our next scheduled telephone appointment.  Following are the goals we discussed today:  Current Barriers:  Unable to independently afford treatment regimen Unable to achieve control of T2DM  Suboptimal therapeutic regimen for T2DM  Pharmacist Clinical Goal(s):  patient will verbalize ability to afford treatment regimen achieve control of T2DM, HLD as evidenced by GOAL A1C, IMPROVED QUALITY OF LIFE, LDL<70 maintain control of T2DM, HLD as evidenced by GOAL A1C, IMPROVED QUALITY OF LIFE, LDL<70  adhere to plan to optimize therapeutic regimen for T2DM, HLD as evidenced by report of adherence to recommended medication management changes through collaboration with PharmD and provider.   Interventions: 1:1 collaboration with Gwenlyn Fudge, FNP regarding development and update of comprehensive plan of care as evidenced by provider attestation and co-signature Inter-disciplinary care team collaboration (see longitudinal plan of care) Comprehensive medication review performed; medication list updated in electronic medical record  Diabetes: Goal on Track (progressing): YES. Uncontrolled-A1C 7.8%, glucose continues to increase (>200s at most times of day); current treatment: Tresiba 28 units, Humulin 70/30 mix pen (3o units with each meal), Januvia 100mg , Farxiga 10mg , metformin; Patient's regimen was based on financial constraints and bright health insurance-->now on medicare, so we will do complete overhaul of current diabetes regimen Patient would like to go on Humulin 70/30 mix pen since her husband is on that regimen, however will readdress; not ideal (concern for hypoglycemia/overlap with mix+basal+other meds) Noted intolerance to Trulicity (may consider Rybelsus, low dose ozempic, mounjaro to decrease insulin requirements) Denies personal and family  history of Medullary thyroid cancer (MTC)  Current glucose readings: fasting glucose: >200s, post prandial glucose: 200-300s Denies hypoglycemic/hyperglycemic symptoms The patient is asked to make an attempt to improve diet and exercise patterns to aid in medical management of this problem. Discussed meal planning options and Plate method for healthy eating Avoid sugary drinks and desserts Incorporate balanced protein, non starchy veggies, 1 serving of carbohydrate with each meal Increase water intake Increase physical activity as able Current exercise: n/a-->encouraged Recommended heart healthy diet, healthy plate method handout given; work on transition DM regimen Assessed patient finances. Will enroll in patient assistance for Farxiga and Onglyza (sub for januvia-easier to get), insulin  Hyperlipidemia:  Goal on Track (progressing): YES. Uncontrolled-LDL 78 current treatment: rosuvastatin 10mg  daily;  Medications previously tried: n/a  Current dietary patterns: heart healthy diet, healthy plate method handout given Counseled on diet, may increase statin if not at goal with A1c control and lifestyle changes  Hypertension: Patient at goal <130/80 BP home report-ranges from 110s-130s/65-80 Continue current regimen--on ACEi  Patient Goals/Self-Care Activities patient will:  - take medications as prescribed as evidenced by patient report and record review check glucose DAILY OR IF SYMPTOMATIC, document, and provide at future appointments collaborate with provider on medication access solutions target a minimum of 150 minutes of moderate intensity exercise weekly engage in dietary modifications by FOLLOWING HEALTHY PLATE METHOD/HEART HEALTHY DIET    Please call the care guide team at [phone] if you need to cancel or reschedule your appointment.   The patient verbalized understanding of instructions, educational materials, and care plan provided today and agreed to receive a  mailed copy of patient instructions, educational materials, and care plan.   Signature 02-19-1970, PharmD, BCPS Clinical Pharmacist, Family Medicine [facility]  II Phone [phone]

## 2021-01-16 ENCOUNTER — Other Ambulatory Visit: Payer: Self-pay | Admitting: Family Medicine

## 2021-01-17 ENCOUNTER — Ambulatory Visit (INDEPENDENT_AMBULATORY_CARE_PROVIDER_SITE_OTHER): Payer: Medicare Other | Admitting: Family

## 2021-01-17 ENCOUNTER — Encounter: Payer: Self-pay | Admitting: Family

## 2021-01-17 VITALS — BP 117/76 | HR 64 | Temp 97.0°F | Ht 61.0 in | Wt 209.0 lb

## 2021-01-17 DIAGNOSIS — M948X9 Other specified disorders of cartilage, unspecified sites: Secondary | ICD-10-CM | POA: Diagnosis not present

## 2021-01-17 MED ORDER — AMOXICILLIN-POT CLAVULANATE 875-125 MG PO TABS: 1.0000 | ORAL_TABLET | Freq: Two times a day (BID) | ORAL | 0 refills | Status: DC

## 2021-01-17 NOTE — Progress Notes (Signed)
° °  Subjective:    Patient ID: Sara Stuart, female    DOB: [birth year], 66 y.o.   MRN: [id number]  Chief Complaint  Patient presents with   Ear Pain     Left  ear pain since last week     Otalgia  There is pain in the left ear. This is a new problem. The current episode started in the past 7 days (Last Tuesday). The problem has been gradually worsening. There has been no fever. The pain is at a severity of 9/10. The pain is moderate. Pertinent negatives include no coughing, diarrhea, headaches, rhinorrhea or sore throat. Associated symptoms comments: Lymph node swelling. She has tried acetaminophen for the symptoms. The treatment provided mild relief.     Review of Systems  HENT:  Positive for ear pain. Negative for rhinorrhea and sore throat.   Respiratory:  Negative for cough.   Gastrointestinal:  Negative for diarrhea.  Neurological:  Negative for headaches.  All other systems reviewed and are negative.     Objective:   Physical Exam Vitals reviewed.  Constitutional:      General: She is not in acute distress.    Appearance: She is well-developed.  HENT:     Head: Normocephalic and atraumatic.     Right Ear: Tympanic membrane normal.     Ears:      Comments: Pinna erythemas, tender, and warm Eyes:     Pupils: Pupils are equal, round, and reactive to light.  Neck:     Thyroid: No thyromegaly.  Cardiovascular:     Rate and Rhythm: Normal rate and regular rhythm.     Heart sounds: Normal heart sounds. No murmur heard. Pulmonary:     Effort: Pulmonary effort is normal. No respiratory distress.     Breath sounds: Normal breath sounds. No wheezing.  Abdominal:     General: Bowel sounds are normal. There is no distension.     Palpations: Abdomen is soft.     Tenderness: There is no abdominal tenderness.  Musculoskeletal:        General: No tenderness. Normal range of motion.     Cervical back: Normal range of motion and neck supple.  Lymphadenopathy:     Cervical:  Cervical adenopathy present.  Skin:    General: Skin is warm and dry.  Neurological:     Mental Status: She is alert and oriented to person, place, and time.     Cranial Nerves: No cranial nerve deficit.     Deep Tendon Reflexes: Reflexes are normal and symmetric.  Psychiatric:        Behavior: Behavior normal.        Thought Content: Thought content normal.        Judgment: Judgment normal.         BP 117/76    Pulse 64    Temp (!) 97 F (36.1 C) (Temporal)    Ht 5\' 1"  (1.549 m)    Wt 209 lb (94.8 kg)    BMI 39.49 kg/m   Assessment & Plan:  Sara Stuart comes in today with chief complaint of Ear Pain ( Left  ear pain since last week )   Diagnosis and orders addressed:  1. Perichondritis Rest Avoid pressure on ear Tylenol as needed Call office if symptoms worsen or do not improve  - amoxicillin-clavulanate (AUGMENTIN) 875-125 MG tablet; Take 1 tablet by mouth 2 (two) times daily.  Dispense: 14 tablet; Refill: 0   Sara Mussel, FNP

## 2021-01-17 NOTE — Patient Instructions (Signed)
Perichondritis, Adult Perichondritis is an infection of the outer ear (pinna) that is caused by bacteria. The pinna is shaped to direct sound into the inner ear. It gets its shape from a firm, elastic tissue (cartilage). The cartilage is protected by another tissue called the perichondrium, which is just under the skin of the pinna. If you injure your ear, bacteria can get under the skin and infect the perichondrium. The infection can cause swelling and pain. A collection of pus (abscess) may form between the perichondrium and the cartilage. This separates the perichondrium and its blood supply from the cartilage of the ear. A severe or untreated infection can lead to a type of ear deformity called cauliflower ear. What are the causes? This condition is caused by bacteria that enters the perichondrium when the outer ear is injured. The injury often results from an upper ear piercing that goes through cartilage instead of the ear lobe. Other causes include: Injury to the outer ear (trauma). Using needles in or on the ear to treat problems (acupuncture). Insect bite. Ear pimple or boil. Ear surgery. Ear burn. What increases the risk? You are more likely to develop this condition if: You have diabetes. You have a weak body defense system (immune system). What are the signs or symptoms? Symptoms of this condition include: Dull pain that gets worse. Increasing ear swelling. Warm, tender, red skin over the ear. Fever. Swollen lymph nodes near the ear. Pus draining from the ear. How is this diagnosed? This condition may be diagnosed based on: Your signs and symptoms, especially if you had a recent ear injury. A physical exam. The health care provider will check the ear for swelling, redness, or drainage. Lab tests. These may include testing a sample of pus to identify the type of bacteria that is causing the infection (culture). How is this treated? This condition is usually treated with  antibiotic medicine. You will be given antibiotics that work well against most bacteria that cause the condition. If your infection is severe, you may be given antibiotics through an IV in the hospital. Your antibiotics will be changed if culture results show that your symptoms are caused by a specific kind of bacteria. Other treatments for this condition may include: Using warm or cold compresses to help with pain and discomfort. Taking pain medicine. Removing pus and dead cartilage with a surgical knife (incision and drainage) or a needle (aspiration). Injecting antibiotics and anti-inflammatory medicine (corticosteroids) into the affected area. Follow these instructions at home: Medicines Take over-the-counter and prescription medicines only as told by your health care provider. Take your antibiotic medicine as told by your health care provider. Do not stop taking the antibiotic even if you start to feel better. Managing pain and swelling If directed, put ice on the affected area. Put ice in a plastic bag. Place a towel between your skin and the bag. Leave the ice on for 20 minutes, 2-3 times a day. If directed, apply heat to the affected area. Use the heat source that your health care provider recommends, such as a moist heat pack or a heating pad. Place a towel between your skin and the heat source. Leave the heat on for 20-30 minutes. Remove the heat if your skin turns bright red. This is especially important if you are unable to feel pain, heat, or cold. You may have a greater risk of getting burned. General instructions If you had incision and drainage done, follow instructions from your health care provider about how  to take care of your incision. Make sure you: Wash your hands with soap and water before and after you change your bandage (dressing). If soap and water are not available, use hand sanitizer. Change your dressing as told by your health care provider. Check your incision  area every day for more redness, swelling, pain, warmth, pus, or a bad smell. Do not take baths, swim, or use a hot tub until your health care provider approves. Clean the ear as told by your health care provider. Resume your usual activities as told by your health care provider. Ask your health care provider what activities are safe for you. Keep all follow-up visits as told by your health care provider. This is important. Contact a health care provider if you have: New or increasing chills or fever. Any signs of infection that get worse or do not get better. These may include more redness or drainage. A severe headache. Get help right away if you: Develop a sudden increase in symptoms such as pain, fever, and swelling. Summary Perichondritis is a bacterial infection of your outer ear. If you injure your ear, bacteria can get under the skin and infect the perichondrium. The main symptoms of this condition are pain, swelling, and tender, red skin over the ear. This condition can be diagnosed by your signs and symptoms, especially after an ear injury. Perichondritis is usually treated with antibiotic medicine. Other treatments include removing pus and dead cartilage with a surgical knife (incision and drainage) or a needle (aspiration). Follow instructions about taking medicines, managing pain and swelling, and taking care of your incision if you had incision and drainage done. This information is not intended to replace advice given to you by your health care provider. Make sure you discuss any questions you have with your health care provider. Document Revised: 01/06/2018 Document Reviewed: 01/06/2018 Elsevier Patient Education  2022 [employer].

## 2021-01-31 DIAGNOSIS — Z794 Long term (current) use of insulin: Secondary | ICD-10-CM

## 2021-01-31 DIAGNOSIS — E1165 Type 2 diabetes mellitus with hyperglycemia: Secondary | ICD-10-CM | POA: Diagnosis not present

## 2021-01-31 DIAGNOSIS — I10 Essential (primary) hypertension: Secondary | ICD-10-CM

## 2021-02-03 ENCOUNTER — Ambulatory Visit (INDEPENDENT_AMBULATORY_CARE_PROVIDER_SITE_OTHER): Payer: Medicare Other | Admitting: Pharmacist

## 2021-02-03 DIAGNOSIS — E782 Mixed hyperlipidemia: Secondary | ICD-10-CM

## 2021-02-03 DIAGNOSIS — Z794 Long term (current) use of insulin: Secondary | ICD-10-CM

## 2021-02-03 DIAGNOSIS — E1165 Type 2 diabetes mellitus with hyperglycemia: Secondary | ICD-10-CM

## 2021-02-05 NOTE — Patient Instructions (Signed)
Visit Information  Following are the goals we discussed today:  Current Barriers:  Unable to independently afford treatment regimen Unable to achieve control of T2DM  Suboptimal therapeutic regimen for T2DM  Pharmacist Clinical Goal(s):  patient will verbalize ability to afford treatment regimen achieve control of T2DM, HLD as evidenced by GOAL A1C, IMPROVED QUALITY OF LIFE, LDL<70 maintain control of T2DM, HLD as evidenced by GOAL A1C, IMPROVED QUALITY OF LIFE, LDL<70  adhere to plan to optimize therapeutic regimen for T2DM, HLD as evidenced by report of adherence to recommended medication management changes through collaboration with PharmD and provider.   Interventions: 1:1 collaboration with Loman Brooklyn, FNP regarding development and update of comprehensive plan of care as evidenced by provider attestation and co-signature Inter-disciplinary care team collaboration (see longitudinal plan of care) Comprehensive medication review performed; medication list updated in electronic medical record  Diabetes: Goal on Track (progressing): YES. Uncontrolled-A1C 7.8%, glucose has improved a bit; backing down on insulin due to recent overlap of mix insulin; current treatment: Tresiba 25 units, Humulin 70/30 mix pen (3o units with each meal), Onglyza 5mg , Farxiga 10mg , metformin; Patient's regimen was based on financial constraints and bright health insurance-->now on medicare, so we will do complete overhaul of current diabetes regimen Patient would like to go on Humulin 70/30 mix pen since her husband is on that regimen, however will readdress; not ideal (concern for hypoglycemia/overlap with mix+basal+other meds) Noted intolerance to Trulicity (may consider Rybelsus, low dose ozempic, mounjaro to decrease insulin requirements) Denies personal and family history of Medullary thyroid cancer (MTC) Continue tresiba 25 units Decrease mix insulin 70/30 to 20 units twice daily with meals Continue  ongylza (free via [employer] patient assistance program) Continue farxiga (free via [employer] patient assistance program) Continue metformin Current glucose readings: fasting glucose: >200s, post prandial glucose: <200 Reports hypoglycemia The patient is asked to make an attempt to improve diet and exercise patterns to aid in medical management of this problem. Discussed meal planning options and Plate method for healthy eating Avoid sugary drinks and desserts Incorporate balanced protein, non starchy veggies, 1 serving of carbohydrate with each meal Increase water intake Increase physical activity as able Current exercise: n/a-->encouraged Recommended heart healthy diet, healthy plate method handout given; work on transition DM regimen Assessed patient finances. Patient approved for [employer] patient assistance for [place] (sub for januvia-easier to get)--patient approved until 12/31/21, insulin--waiting on [facility] response  Hyperlipidemia:  Goal on Track (progressing): YES. Uncontrolled-LDL 78 current treatment: rosuvastatin 10mg  daily;  Medications previously tried: n/a  Current dietary patterns: heart healthy diet, healthy plate method handout given Counseled on diet, may increase statin if not at goal with A1c control and lifestyle changes  Hypertension: Patient at goal <130/80 BP home report-ranges from 110s-130s/65-80 Continue current regimen--on ACEi  Patient Goals/Self-Care Activities patient will:  - take medications as prescribed as evidenced by patient report and record review check glucose DAILY OR IF SYMPTOMATIC, document, and provide at future appointments collaborate with provider on medication access solutions target a minimum of 150 minutes of moderate intensity exercise weekly engage in dietary modifications by FOLLOWING HEALTHY PLATE METHOD/HEART HEALTHY DIET   Plan: Telephone follow up appointment with care management team member scheduled for:  1  MONTH  Signature  Regina Eck, PharmD, BCPS Clinical Pharmacist, [place]  II Phone [phone]   Please call the care guide team at [phone] if you need to cancel or reschedule your appointment.   The  patient verbalized understanding of instructions, educational materials, and care plan provided today and agreed to receive a mailed copy of patient instructions, educational materials, and care plan.

## 2021-02-05 NOTE — Progress Notes (Signed)
Chronic Care Management Pharmacy Note  02/03/2021 Name:  Sara Stuart MRN:  [id number] DOB:  07/20/1955  Summary: t2dm, hld  Recommendations/Changes made from today's visit  Diabetes: Goal on Track (progressing): YES. Uncontrolled-A1C 7.8%, glucose has improved a bit; backing down on insulin due to recent overlap of mix insulin; current treatment: Tresiba 25 units, Humulin 70/30 mix pen (3o units with each meal), Onglyza 7m, Farxiga 171m metformin--see new regimen below; Patient's regimen was based on financial constraints and bright health insurance-->now on medicare, so we will do complete overhaul of current diabetes regimen Patient would like to go on Humulin 70/30 mix pen since her husband is on that regimen, however will readdress; not ideal (concern for hypoglycemia/overlap with mix+basal+other meds) Noted intolerance to Trulicity (may consider Rybelsus, low dose ozempic, mounjaro to decrease insulin requirements) Denies personal and family history of Medullary thyroid cancer (MTC) Continue tresiba 25 units Decrease mix insulin 70/30 to 20 units twice daily with meals (working to taper off) Continue ongylza (free via az&me patient assistance program) Continue farxiga (free via az&me patient assistance program) Continue metformin Current glucose readings: fasting glucose: >200s, post prandial glucose: <200 Reports hypoglycemia The patient is asked to make an attempt to improve diet and exercise patterns to aid in medical management of this problem. Discussed meal planning options and Plate method for healthy eating Avoid sugary drinks and desserts Incorporate balanced protein, non starchy veggies, 1 serving of carbohydrate with each meal Increase water intake Increase physical activity as able Current exercise: n/a-->encouraged Recommended heart healthy diet, healthy plate method handout given; work on transition DM regimen Assessed patient finances. Patient [place] for Az[employer]  [place]easier to get)--patient approved until 12/31/21, insulin--waiting on lilly [employer] response  Hyperlipidemia:  Goal on Track (progressing): YES. Uncontrolled-LDL 78 current treatment: rosuvastatin 1062maily;  Medications previously tried: n/a  Current dietary patterns: heart healthy diet, healthy plate method handout given Counseled on diet, may increase statin if not at goal with A1c control and lifestyle changes  Hypertension: Patient at goal <130/80 BP home report-ranges from 110s-130s/65-80 Continue current regimen--on ACEi  Patient Goals/Self-Care Activities patient will:  - take medications as prescribed as evidenced by patient report and record review check glucose DAILY OR IF SYMPTOMATIC, document, and provide at future appointments collaborate with provider on medication access solutions target a minimum of 150 minutes of moderate intensity exercise weekly engage in dietary modifications by FOLLOWING HEALTHY PLATE METHOD/HEART HEALTHY DIET  Subjective: [name] an 66 17o. year old female who is a primary patient of JoyLoman BrooklynNP.  The CCM team was consulted for assistance with disease management and care coordination needs.    Engaged with patient by telephone for follow up visit in response to provider referral for pharmacy case management and/or care coordination services.   Consent to Services:  The patient was given information about Chronic Care Management services, agreed to services, and gave verbal consent prior to initiation of services.  Please see initial visit note for detailed documentation.   Patient Care Team: JoyLoman BrooklynNP as PCP - General (Family Medicine) P[facility]vera [facility]isePH[facility] Pharmacist (Family Medicine) JohCelestia KhatD [facility])  Objective:  Lab Results  Component Value Date   CREATININE 0.96 11/01/2020   CREATININE 0.89 04/29/2020   CREATININE 0.94 12/18/2019     Lab Results  Component Value Date   HGBA1C 7.8 (H) 11/01/2020   Last diabetic Eye exam:  Lab  Results  Component Value Date/Time   HMDIABEYEEXA No Retinopathy 06/23/2020 12:00 AM    Last diabetic Foot exam: No results found for: HMDIABFOOTEX      Component Value Date/Time   CHOL 151 11/01/2020 1022   TRIG 121 11/01/2020 1022   HDL 56 11/01/2020 1022   CHOLHDL 2.7 11/01/2020 1022   CHOLHDL 3.8 05/02/2017 1617   LDLCALC 74 11/01/2020 1022   LDLCALC 111 (H) 05/02/2017 1617    Hepatic Function Latest Ref Rng & Units 11/01/2020 04/29/2020 12/18/2019  Total Protein 6.0 - 8.5 g/dL 7.0 6.9 6.8  Albumin 3.8 - 4.8 g/dL 4.2 3.9 4.2  AST 0 - 40 IU/L _0 ALT 0 - 32 IU/L _1 Alk Phosphatase 44 - 121 IU/L 80 102 95  Total Bilirubin 0.0 - 1.2 mg/dL 0.3 0.3 0.3  Bilirubin, Direct 0.0 - 0.3 mg/dL - - -    Lab Results  Component Value Date/Time   TSH 1.41 05/02/2017 04:17 PM   FREET4 1.1 05/02/2017 04:17 PM    CBC Latest Ref Rng & Units 11/01/2020 04/29/2020 12/18/2019  WBC 3.4 - 10.8 x10E3/uL 9.0 10.3 8.9  Hemoglobin 11.1 - 15.9 g/dL 13.3 13.3 12.6  Hematocrit 34.0 - 46.6 % 42.3 41.8 38.8  Platelets 150 - 450 x10E3/uL 306 256 288    Lab Results  Component Value Date/Time   VD25OH 22 (L) 05/02/2017 04:17 PM    Clinical ASCVD: No  The 10-year ASCVD risk score (Arnett DK, et al., 2019) is: 10.1%   Values used to calculate the score:     Age: 77 years     Sex: Female     Is Non-Hispanic African American: No     Diabetic: Yes     Tobacco smoker: No     Systolic Blood Pressure: 939 mmHg     Is BP treated: Yes     HDL Cholesterol: 56 mg/dL     Total Cholesterol: 151 mg/dL    Other: (CHADS2VASc if Afib, PHQ9 if depression, MMRC or CAT for COPD, ACT, DEXA)  Social History   Tobacco Use  Smoking Status Never  Smokeless Tobacco Never   BP Readings from Last 3 Encounters:  01/17/21 117/76  01/08/21 119/76  11/01/20 117/76   Pulse Readings from Last 3  Encounters:  01/17/21 64  11/01/20 61  07/29/20 61   Wt Readings from Last 3 Encounters:  01/17/21 209 lb (94.8 kg)  11/01/20 207 lb (93.9 kg)  07/29/20 206 lb (93.4 kg)    Assessment: Review of patient past medical history, allergies, medications, health status, including review of consultants reports, laboratory and other test data, was performed as part of comprehensive evaluation and provision of chronic care management services.   SDOH:  (Social Determinants of Health) assessments and interventions performed:    CCM Care Plan  Allergies  Allergen Reactions   Trulicity [Dulaglutide]     GI intolerance    Medications Reviewed Today     Reviewed by Sharion Balloon, FNP (Family Nurse Practitioner) on 01/17/21 at [phone]  Med List Status: <None>   Medication Order Taking? Sig Documenting Provider Last Dose Status Informant  amoxicillin-clavulanate (AUGMENTIN) 875-125 MG tablet [id number] Yes Take 1 tablet by mouth 2 (two) times daily. Evelina Dun A, FNP  Active   aspirin EC 81 MG tablet [id number] Yes Take 1 tablet (81 mg total) by mouth daily. Orson Eva, MD Taking Active Self  Med Note [facility], KIMBERLY A   Thu Mar 14, 2017  2:26 PM)    baclofen (LIORESAL) 10 MG tablet [id number] Yes Take 1 tablet (10 mg total) by mouth 3 (three) times daily as needed. Loman Brooklyn, FNP Taking Active   blood glucose meter kit and supplies KIT [id number] Yes by Does not apply route daily as needed. Dispense based on patient and insurance preference. Use up to four times daily as directed. [provider] Taking Active            Med Note Blanca Friend, Sherian Maroon Jan 12, 2021  2:56 PM) Ordered via advanced diabetes supply via [employer] portal  dapagliflozin propanediol (FARXIGA) 10 MG TABS tablet [id number] Yes Take 1 tablet (10 mg total) by mouth daily before breakfast. Loman Brooklyn, FNP Taking Active   DILT-XR 120 MG 24 hr capsule [id number] Yes Take 1 capsule by mouth once  daily Loman Brooklyn, FNP Taking Active   fluticasone (FLONASE) 50 MCG/ACT nasal spray [id number] Yes Place 2 sprays into both nostrils daily as needed. [provider] Taking Active   glucose blood test strip [id number] Yes Test BS four times daily Dx E11.65 Janora Norlander, DO Taking Active   ibuprofen (ADVIL) 800 MG tablet [id number] Yes TAKE 1 TABLET BY MOUTH EVERY 8 HOURS AS NEEDED FOR MODERATE PAIN Loman Brooklyn, FNP Taking Active   insulin degludec (TRESIBA FLEXTOUCH) 100 UNIT/ML FlexTouch Pen [id number] Yes Inject 26 Units into the skin daily.  Patient taking differently: Inject 28 Units into the skin daily.   Loman Brooklyn, FNP Taking Active   insulin isophane & regular human KwikPen (HUMULIN 70/30 KWIKPEN) (70-30) 100 UNIT/ML KwikPen [id number] Yes Inject 35 Units into the skin with breakfast, with lunch, and with evening meal. Loman Brooklyn, FNP Taking Active   Insulin Pen Needle (B-D ULTRAFINE III SHORT PEN) 31G X 8 MM MISC [id number] Yes 1 each by Does not apply route as directed. Cassandria Anger, MD Taking Active Self  lisinopril (ZESTRIL) 2.5 MG tablet [id number] Yes Take 1 tablet (2.5 mg total) by mouth daily. Loman Brooklyn, FNP Taking Active   metFORMIN (GLUCOPHAGE-XR) 500 MG 24 hr tablet [id number] Yes Take 2 tablets (1,000 mg total) by mouth 2 (two) times daily. Loman Brooklyn, FNP Taking Active   metoprolol tartrate (LOPRESSOR) 25 MG tablet [id number] Yes Take 1 tablet by mouth once daily Hendricks Limes F, FNP Taking Active   nitroGLYCERIN (NITROSTAT) 0.4 MG SL tablet [id number] Yes DISSOLVE ONE TABLET UNDER THE TONGUE EVERY 5 MINUTES AS NEEDED FOR CHEST PAIN.  DO NOT EXCEED A TOTAL OF 3 DOSES IN 15 MINUTES Fay Records, MD Taking Active   omeprazole (PRILOSEC) 40 MG capsule [id number] Yes Take 1 capsule by mouth once daily Loman Brooklyn, FNP Taking Active   rosuvastatin (CRESTOR) 10 MG tablet [id number] Yes Take 1 tablet by mouth once daily Loman Brooklyn, FNP Taking Active   saxagliptin HCl (ONGLYZA) 5 MG TABS tablet [id number] Yes Take 1 tablet (5 mg total) by mouth daily. Loman Brooklyn, FNP Taking Active   spironolactone (ALDACTONE) 25 MG tablet [id number] Yes Take 1/2 (one-half) tablet by mouth once daily Loman Brooklyn, FNP Taking Active   traZODone (DESYREL) 50 MG tablet [id number] Yes TAKE 1 TABLET BY MOUTH AT BEDTIME Loman Brooklyn, FNP Taking Active   triamterene-hydrochlorothiazide (DYAZIDE) 37.5-25 MG capsule [id number] Yes Take 1 capsule by mouth once daily Loman Brooklyn,  FNP Taking Active             Patient Active Problem List   Diagnosis Date Noted   Aortic atherosclerosis ([place]) 11/01/2020   Pain of right upper extremity 06/28/2020   Insomnia 12/18/2019   Coronary artery disease involving native coronary artery of native heart without angina pectoris 02/03/2019   Mixed hyperlipidemia 10/10/2017   Uncontrolled type 2 diabetes mellitus with hyperglycemia, with long-term current use of insulin ([place]) 03/14/2017   Essential hypertension, benign 03/14/2017   GERD (gastroesophageal reflux disease) 12/29/2012   History of gout 12/29/2012   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   Morbid obesity ([place]) 12/29/2012   Degenerative spondylolisthesis 07/08/2012   Spinal stenosis, lumbar region, with neurogenic claudication 07/08/2012    Immunization History  Administered Date(s) Administered   Influenza, High Dose Seasonal PF 10/22/2020   Influenza, Quadrivalent, Recombinant, Inj, Pf 09/18/2018   Influenza,inj,Quad PF,6+ Mos 09/21/2017   Influenza,inj,quad, With Preservative 01/05/2017, 09/21/2017   Influenza,trivalent, recombinat, inj, PF 10/02/2014   Influenza-Unspecified 09/18/2018, 10/02/2019, 10/27/2020   Moderna Sars-Covid-2 Vaccination 11/03/2019, 12/03/2019, 06/16/2020   PNEUMOCOCCAL CONJUGATE-20 11/01/2020   Pneumococcal Polysaccharide-23 09/10/2017   Tdap 01/07/2018    Conditions to be  addressed/monitored: HLD and DMII  Care Plan : PHARMD MEDICATION MANAGEMENT  Updates made by Lavera Guise, RPH since 02/05/2021 12:00 AM     Problem: DISEASE PROGRESSION PREVENTION      Long-Range Goal: T2DM, HLD, HTN PHARMD GOAL   Recent Progress: Not on track  Priority: High  Note:   Current Barriers:  Unable to independently afford treatment regimen Unable to achieve control of T2DM  Suboptimal therapeutic regimen for T2DM  Pharmacist Clinical Goal(s):  patient will verbalize ability to afford treatment regimen achieve control of T2DM, HLD as evidenced by GOAL A1C, IMPROVED QUALITY OF LIFE, LDL<70 maintain control of T2DM, HLD as evidenced by GOAL A1C, IMPROVED QUALITY OF LIFE, LDL<70  adhere to plan to optimize therapeutic regimen for T2DM, HLD as evidenced by report of adherence to recommended medication management changes through collaboration with PharmD and provider.   Interventions: 1:1 collaboration with Loman Brooklyn, FNP regarding development and update of comprehensive plan of care as evidenced by provider attestation and co-signature Inter-disciplinary care team collaboration (see longitudinal plan of care) Comprehensive medication review performed; medication list updated in electronic medical record  Diabetes: Goal on Track (progressing): YES. Uncontrolled-A1C 7.8%, glucose has improved a bit; backing down on insulin due to recent overlap of mix insulin; current treatment: Tresiba 25 units, Humulin 70/30 mix pen (3o units with each meal), Onglyza 68m, Farxiga 11m metformin; Patient's regimen was based on financial constraints and bright health insurance-->now on medicare, so we will do complete overhaul of current diabetes regimen Patient would like to go on Humulin 70/30 mix pen since her husband is on that regimen, however will readdress; not ideal (concern for hypoglycemia/overlap with mix+basal+other meds) Noted intolerance to Trulicity (may consider  Rybelsus, low dose ozempic, mounjaro to decrease insulin requirements) Denies personal and family history of Medullary thyroid cancer (MTC) Continue tresiba 25 units Decrease mix insulin 70/30 to 20 units twice daily with meals Continue ongylza (free via az&me patient assistance program) Continue farxiga (free via az&me patient assistance program) Continue metformin Current glucose readings: fasting glucose: >200s, post prandial glucose: <200 Reports hypoglycemia The patient is asked to make an attempt to improve diet and exercise patterns to aid in medical management of this problem. Discussed meal planning options and Plate method for healthy eating  Avoid sugary drinks and desserts Incorporate balanced protein, non starchy veggies, 1 serving of carbohydrate with each meal Increase water intake Increase physical activity as able Current exercise: n/a-->encouraged Recommended heart healthy diet, healthy plate method handout given; work on transition DM regimen Assessed patient finances. Patient approved for Az&me patient assistance for [place] (sub for januvia-easier to get)--patient approved until 12/31/21, insulin --waiting on lilly cares response  Hyperlipidemia:  Goal on Track (progressing): YES. Uncontrolled-LDL 78 current treatment: rosuvastatin 63m daily;  Medications previously tried: n/a  Current dietary patterns: heart healthy diet, healthy plate method handout given Counseled on diet, may increase statin if not at goal with A1c control and lifestyle changes  Hypertension: Patient at goal <130/80 BP home report-ranges from 110s-130s/65-80 Continue current regimen--on ACEi  Patient Goals/Self-Care Activities patient will:  - take medications as prescribed as evidenced by patient report and record review check glucose DAILY OR IF SYMPTOMATIC, document, and provide at future appointments collaborate with provider on medication access solutions target a minimum  of 150 minutes of moderate intensity exercise weekly engage in dietary modifications by FOLLOWING HEALTHY PLATE METHOD/HEART HEALTHY DIET      Medication Assistance: Application for lilly cares  medication assistance program. in process.  Anticipated assistance start date tbd.  See plan of care for additional detail. Approved for az&me patient assistance for onglyza and farxiga  [facility]  [facility] [address]Phone: [phone]: [address] [address] [place] [place]Phone: [phone]: [phone]  Follow Up:  Patient agrees to Care Plan and Follow-up.  Plan: Telephone follow up appointment with care management team member scheduled for:  1 month   JRegina Eck PharmD, BCPS Clinical Pharmacist, [place] Phone [phone]

## 2021-02-09 ENCOUNTER — Encounter: Payer: Self-pay | Admitting: Family Medicine

## 2021-02-09 ENCOUNTER — Ambulatory Visit (INDEPENDENT_AMBULATORY_CARE_PROVIDER_SITE_OTHER): Payer: Medicare Other

## 2021-02-09 ENCOUNTER — Ambulatory Visit (INDEPENDENT_AMBULATORY_CARE_PROVIDER_SITE_OTHER): Payer: Medicare Other | Admitting: Family Medicine

## 2021-02-09 VITALS — BP 115/80 | HR 92 | Temp 97.8°F | Ht 61.0 in | Wt 209.4 lb

## 2021-02-09 DIAGNOSIS — Z0001 Encounter for general adult medical examination with abnormal findings: Secondary | ICD-10-CM | POA: Diagnosis not present

## 2021-02-09 DIAGNOSIS — M79601 Pain in right arm: Secondary | ICD-10-CM

## 2021-02-09 DIAGNOSIS — Z794 Long term (current) use of insulin: Secondary | ICD-10-CM

## 2021-02-09 DIAGNOSIS — I10 Essential (primary) hypertension: Secondary | ICD-10-CM | POA: Diagnosis not present

## 2021-02-09 DIAGNOSIS — E1165 Type 2 diabetes mellitus with hyperglycemia: Secondary | ICD-10-CM

## 2021-02-09 DIAGNOSIS — Z23 Encounter for immunization: Secondary | ICD-10-CM

## 2021-02-09 DIAGNOSIS — E782 Mixed hyperlipidemia: Secondary | ICD-10-CM | POA: Diagnosis not present

## 2021-02-09 DIAGNOSIS — G8929 Other chronic pain: Secondary | ICD-10-CM | POA: Diagnosis not present

## 2021-02-09 DIAGNOSIS — Z7189 Other specified counseling: Secondary | ICD-10-CM

## 2021-02-09 DIAGNOSIS — M25511 Pain in right shoulder: Secondary | ICD-10-CM | POA: Diagnosis not present

## 2021-02-09 DIAGNOSIS — J069 Acute upper respiratory infection, unspecified: Secondary | ICD-10-CM

## 2021-02-09 DIAGNOSIS — Z0000 Encounter for general adult medical examination without abnormal findings: Secondary | ICD-10-CM

## 2021-02-09 LAB — BAYER DCA HB A1C WAIVED: HB A1C (BAYER DCA - WAIVED): 7.6 % — ABNORMAL HIGH (ref 4.8–5.6)

## 2021-02-09 MED ORDER — BACLOFEN 10 MG PO TABS: 10.0000 mg | ORAL_TABLET | Freq: Three times a day (TID) | ORAL | 2 refills | Status: DC | PRN

## 2021-02-09 MED ORDER — TRIAMTERENE-HCTZ 37.5-25 MG PO CAPS: 1.0000 | ORAL_CAPSULE | Freq: Every day | ORAL | 1 refills | Status: DC

## 2021-02-09 MED ORDER — DILTIAZEM HCL ER 120 MG PO CP24: 120.0000 mg | ORAL_CAPSULE | Freq: Every day | ORAL | 1 refills | Status: DC

## 2021-02-09 MED ORDER — METOPROLOL TARTRATE 25 MG PO TABS: 25.0000 mg | ORAL_TABLET | Freq: Every day | ORAL | 1 refills | Status: DC

## 2021-02-09 MED ORDER — METFORMIN HCL ER 500 MG PO TB24: 1000.0000 mg | ORAL_TABLET | Freq: Two times a day (BID) | ORAL | 1 refills | Status: DC

## 2021-02-09 NOTE — Progress Notes (Signed)
Assessment & Plan:  1. Well adult exam Preventive health education provided. - Lipid panel - CBC with Differential/Platelet - CMP14+EGFR  2. ACP (advance care planning) Forms and education provided on advance care planning documents.  3. Uncontrolled type 2 diabetes mellitus with hyperglycemia, with long-term current use of insulin (HCC) Lab Results  Component Value Date   HGBA1C 7.6 (H) 02/09/2021   HGBA1C 7.8 (H) 11/01/2020   HGBA1C 8.1 (H) 07/29/2020    - Diabetes is not at goal of A1c < 7. - Medications: continue current medications.  I discussed starting patient on a GLP such as Rybelsus and discontinuing Onglyza, but she did not want to do this without speaking with our clinical pharmacist since she has been making most of her medication changes and getting prescription assistance for her. - Home glucose monitoring: Continue monitoring - Patient is currently taking a statin. Patient is taking an ACE-inhibitor/ARB.  - Instruction/counseling given: discussed the need for weight loss and discussed diet  Diabetes Health Maintenance Due  Topic Date Due   OPHTHALMOLOGY EXAM  06/23/2021   HEMOGLOBIN A1C  08/09/2021   FOOT EXAM  11/01/2021    Lab Results  Component Value Date   LABMICR <3.0 11/01/2020   LABMICR <3.0 09/09/2019   MICROALBUR 1.8 05/02/2017   - Lipid panel - CBC with Differential/Platelet - CMP14+EGFR - Bayer DCA Hb A1c Waived - metFORMIN (GLUCOPHAGE-XR) 500 MG 24 hr tablet; Take 2 tablets (1,000 mg total) by mouth 2 (two) times daily.  Dispense: 360 tablet; Refill: 1  4. Mixed hyperlipidemia Well controlled on current regimen.  - Lipid panel - CMP14+EGFR  5. Essential hypertension, benign Well controlled on current regimen.  - Lipid panel - CBC with Differential/Platelet - CMP14+EGFR - diltiazem (DILT-XR) 120 MG 24 hr capsule; Take 1 capsule (120 mg total) by mouth daily.  Dispense: 90 capsule; Refill: 1 - metoprolol tartrate (LOPRESSOR) 25 MG  tablet; Take 1 tablet (25 mg total) by mouth daily.  Dispense: 90 tablet; Refill: 1 - triamterene-hydrochlorothiazide (DYAZIDE) 37.5-25 MG capsule; Take 1 each (1 capsule total) by mouth daily.  Dispense: 90 capsule; Refill: 1  6. Viral URI Discussed symptom management.  7. Chronic right shoulder pain - Ambulatory referral to Physical Therapy - DG Shoulder Right  8. Right arm pain - baclofen (LIORESAL) 10 MG tablet; Take 1 tablet (10 mg total) by mouth 3 (three) times daily as needed.  Dispense: 60 each; Refill: 2 - Ambulatory referral to Physical Therapy  9. Immunization due - Varicella-zoster vaccine IM (Shingrix)   Follow-up: Return in about 3 months (around 05/09/2021) for follow-up of chronic medication conditions.   Hendricks Limes, MSN, APRN, FNP-C [facility]  Subjective:  Patient ID: Sara Stuart, female    DOB: Mar 21, 1955  Age: 66 y.o. MRN: [id number]  Patient Care Team: Loman Brooklyn, FNP as PCP - General (Family Medicine) Lavera Guise, [facility] as Pharmacist (Family Medicine) Celestia Khat, OD (Optometry)   CC:  Chief Complaint  Patient presents with   Annual Exam   Headache   Nasal Congestion    X 2-3 days- blood when blowing nose   Shoulder Pain    Right x 6 months and no better. Could not afford PT at the time.     HPI Sara Stuart presents for her annual physical.  Occupation: retired, Marital status: married, Substance use: none Diet: Diabetic, Exercise: cleaning, yard work Last eye exam: June 2022 Last dental exam: dentures Last colonoscopy: 03/26/2019 with recommended  repeat in 5 years Last mammogram: 08/31/2020 Last pap smear: s/p hysterectomy DEXA: never  - agreeable, but radiology technician is not here today Hepatitis C Screening: negative on 11/11/2017 Immunizations: Flu Vaccine: up to date Tdap Vaccine: up to date  Shingrix Vaccine:  agreeable to receive today   COVID-19 Vaccine:  has received 3 doses Pneumonia Vaccine:  up to date  Advanced Directives Patient does not have advanced directives including DNR, living will, healthcare power of attorney, financial power of attorney, and MOST form.   DEPRESSION SCREENING PHQ 2/9 Scores 02/09/2021 01/17/2021 11/01/2020 07/29/2020 06/28/2020 04/29/2020 01/21/2020  PHQ - 2 Score 0 0 0 0 0 0 0  PHQ- 9 Score 0 - 1 0 - - -     Diabetes: Patient presents for follow up of diabetes. Current symptoms include: hyperglycemia. Known diabetic complications: cardiovascular disease. Medication compliance: Yes, patient reports she is using [place] 25 units daily, insulin 70/30 20 units with meals which is decreased from 30 units with meals due to reported episodes of hypoglycemia, Onglyza, Farxiga, and metformin. Current diet: in general, a "healthy" diet  . Current exercise: housecleaning and yard work. Home blood sugar records: BGs are running  consistent with Hgb A1C.  Patient reports she has been having episodes of hypoglycemia, but upon review of her records she is having low normals around 3:30 in the afternoon after she used her insulin 70/30 at lunchtime.  Is she  on ACE inhibitor or angiotensin II receptor blocker? Yes (Lisinopril). Is she on a statin? Yes (Rosuvastatin).   Patient complains of head congestion, headache, and sneezing. Onset of symptoms was 4 days ago, unchanged since that time. She is drinking plenty of fluids. Evaluation to date: none. Treatment to date: antihistamines and nasal steroids. She does not smoke.   Patient also reports right shoulder pain x8 months. Pain started when she was pulling groceries up some stairs. States she felt it pop like a rubberband. Describes pain as stabbing and rates it 3/10 currently. She reports it gets very stiff and wakes her at night. She was previously doing physical therapy but had to quit due to cost. She has been using a TENS unit.  She previously tried baclofen, ibuprofen, Tylenol, and Biofreeze.  She has had a negative humerus  x-ray.    Review of Systems  Constitutional:  Negative for chills, fever, malaise/fatigue and weight loss.  HENT:  Positive for congestion. Negative for ear discharge, ear pain, nosebleeds, sinus pain, sore throat and tinnitus.   Eyes:  Negative for blurred vision, double vision, pain, discharge and redness.  Respiratory:  Negative for cough, shortness of breath and wheezing.   Cardiovascular:  Negative for chest pain, palpitations and leg swelling.  Gastrointestinal:  Negative for abdominal pain, constipation, diarrhea, heartburn, nausea and vomiting.  Genitourinary:  Negative for dysuria, frequency and urgency.  Musculoskeletal:  Positive for back pain, joint pain and myalgias.  Skin:  Negative for rash.  Neurological:  Positive for headaches. Negative for dizziness, seizures and weakness.  Psychiatric/Behavioral:  Negative for depression, substance abuse and suicidal ideas. The patient is not nervous/anxious.     Current Outpatient Medications:    aspirin EC 81 MG tablet, Take 1 tablet (81 mg total) by mouth daily., Disp: 30 tablet, Rfl:    baclofen (LIORESAL) 10 MG tablet, Take 1 tablet (10 mg total) by mouth 3 (three) times daily as needed., Disp: 60 each, Rfl: 2   blood glucose meter kit and supplies KIT, by Does not apply  route daily as needed. Dispense based on patient and insurance preference. Use up to four times daily as directed., Disp: , Rfl:    dapagliflozin propanediol (FARXIGA) 10 MG TABS tablet, Take 1 tablet (10 mg total) by mouth daily before breakfast., Disp: 90 tablet, Rfl: 4   DILT-XR 120 MG 24 hr capsule, Take 1 capsule by mouth once daily, Disp: 90 capsule, Rfl: 0   fluticasone (FLONASE) 50 MCG/ACT nasal spray, Place 2 sprays into both nostrils daily as needed., Disp: , Rfl:    glucose blood test strip, Test BS four times daily Dx E11.65, Disp: 400 each, Rfl: 3   ibuprofen (ADVIL) 800 MG tablet, TAKE 1 TABLET BY MOUTH EVERY 8 HOURS AS NEEDED FOR MODERATE PAIN, Disp:  90 tablet, Rfl: 0   insulin degludec (TRESIBA FLEXTOUCH) 100 UNIT/ML FlexTouch Pen, Inject 26 Units into the skin daily. (Patient taking differently: Inject 25 Units into the skin daily.), Disp: 15 mL, Rfl: 3   insulin isophane & regular human KwikPen (HUMULIN 70/30 KWIKPEN) (70-30) 100 UNIT/ML KwikPen, Inject 35 Units into the skin with breakfast, with lunch, and with evening meal., Disp: 30 mL, Rfl: 11   Insulin Pen Needle (B-D ULTRAFINE III SHORT PEN) 31G X 8 MM MISC, 1 each by Does not apply route as directed., Disp: 100 each, Rfl: 3   lisinopril (ZESTRIL) 2.5 MG tablet, Take 1 tablet (2.5 mg total) by mouth daily., Disp: 90 tablet, Rfl: 1   metFORMIN (GLUCOPHAGE-XR) 500 MG 24 hr tablet, Take 2 tablets (1,000 mg total) by mouth 2 (two) times daily., Disp: 360 tablet, Rfl: 1   metoprolol tartrate (LOPRESSOR) 25 MG tablet, Take 1 tablet by mouth once daily, Disp: 90 tablet, Rfl: 0   nitroGLYCERIN (NITROSTAT) 0.4 MG SL tablet, DISSOLVE ONE TABLET UNDER THE TONGUE EVERY 5 MINUTES AS NEEDED FOR CHEST PAIN.  DO NOT EXCEED A TOTAL OF 3 DOSES IN 15 MINUTES, Disp: 25 tablet, Rfl: 0   omeprazole (PRILOSEC) 40 MG capsule, Take 1 capsule by mouth once daily, Disp: 90 capsule, Rfl: 0   rosuvastatin (CRESTOR) 10 MG tablet, Take 1 tablet by mouth once daily, Disp: 90 tablet, Rfl: 0   saxagliptin HCl (ONGLYZA) 5 MG TABS tablet, Take 1 tablet (5 mg total) by mouth daily., Disp: 90 tablet, Rfl: 4   spironolactone (ALDACTONE) 25 MG tablet, Take 1/2 (one-half) tablet by mouth once daily, Disp: 45 tablet, Rfl: 0   traZODone (DESYREL) 50 MG tablet, TAKE 1 TABLET BY MOUTH AT BEDTIME, Disp: 90 tablet, Rfl: 1   triamterene-hydrochlorothiazide (DYAZIDE) 37.5-25 MG capsule, Take 1 capsule by mouth once daily, Disp: 90 capsule, Rfl: 0  Allergies  Allergen Reactions   Trulicity [Dulaglutide]     GI intolerance    Past Medical History:  Diagnosis Date   Arthritis    Chronic back pain    spondylolishthesis   DDD  (degenerative disc disease), lumbosacral    Diabetes mellitus without complication (HCC) diagnosed in 02/2014   takes Metformin and Glipizide daily   GERD (gastroesophageal reflux disease)    takes Omeprazole daily   Gout    takes Colchicine daily as needed   Hypertension    takes Dyazide and Diltiazem daily   Nocturia    Polio    PONV (postoperative nausea and vomiting)    Wears glasses     Past Surgical History:  Procedure Laterality Date   BACK SURGERY  07/2012   bladder tacked      COLONOSCOPY  FOOT SURGERY     more than 5 surgeries on right foot related to polio   surgery for polio     states x 5    TOTAL ABDOMINAL HYSTERECTOMY  2002   TUBAL PREGNANCY   TUBAL LIGATION      Family History  Problem Relation Age of Onset   Colon cancer Mother    Diabetes Father    Thyroid disease Father    Heart attack Father     Social History   Socioeconomic History   Marital status: Married    Spouse name: Not on file   Number of children: Not on file   Years of education: Not on file   Highest education level: Not on file  Occupational History   Not on file  Tobacco Use   Smoking status: Never   Smokeless tobacco: Never  Vaping Use   Vaping Use: Never used  Substance and Sexual Activity   Alcohol use: No   Drug use: No   Sexual activity: Yes    Birth control/protection: Surgical  Other Topics Concern   Not on file  Social History Narrative   Not on file   Social Determinants of Health   Financial Resource Strain: Not on file  Food Insecurity: Not on file  Transportation Needs: Not on file  Physical Activity: Not on file  Stress: Not on file  Social Connections: Not on file  Intimate Partner Violence: Not on file      Objective:    BP 115/80    Pulse 92    Temp 97.8 F (36.6 C) (Temporal)    Ht '5\' 1"'  (1.549 m)    Wt 209 lb 6.4 oz (95 kg)    SpO2 96%    BMI 39.57 kg/m   Wt Readings from Last 3 Encounters:  02/09/21 209 lb 6.4 oz (95 kg)  01/17/21  209 lb (94.8 kg)  11/01/20 207 lb (93.9 kg)   Physical Exam Vitals reviewed.  Constitutional:      General: She is not in acute distress.    Appearance: Normal appearance. She is morbidly obese. She is not ill-appearing, toxic-appearing or diaphoretic.  HENT:     Head: Normocephalic and atraumatic.     Right Ear: Tympanic membrane, ear canal and external ear normal. There is no impacted cerumen.     Left Ear: Tympanic membrane, ear canal and external ear normal. There is no impacted cerumen.     Nose: Congestion present. No rhinorrhea.     Mouth/Throat:     Mouth: Mucous membranes are moist.     Pharynx: Oropharynx is clear. No oropharyngeal exudate or posterior oropharyngeal erythema.  Eyes:     General: No scleral icterus.       Right eye: No discharge.        Left eye: No discharge.     Conjunctiva/sclera: Conjunctivae normal.     Pupils: Pupils are equal, round, and reactive to light.  Cardiovascular:     Rate and Rhythm: Normal rate and regular rhythm.     Heart sounds: Normal heart sounds. No murmur heard.   No friction rub. No gallop.  Pulmonary:     Effort: Pulmonary effort is normal. No respiratory distress.     Breath sounds: Normal breath sounds. No stridor. No wheezing, rhonchi or rales.  Abdominal:     General: Abdomen is flat. Bowel sounds are normal. There is no distension.     Palpations: Abdomen is soft. There is no hepatomegaly,  splenomegaly or mass.     Tenderness: There is no abdominal tenderness. There is no guarding or rebound.     Hernia: No hernia is present.  Musculoskeletal:        General: Normal range of motion.     Cervical back: Normal range of motion and neck supple. No rigidity. No muscular tenderness.  Lymphadenopathy:     Cervical: No cervical adenopathy.  Skin:    General: Skin is warm and dry.     Capillary Refill: Capillary refill takes less than 2 seconds.  Neurological:     General: No focal deficit present.     Mental Status: She is  alert and oriented to person, place, and time. Mental status is at baseline.  Psychiatric:        Mood and Affect: Mood normal.        Behavior: Behavior normal.        Thought Content: Thought content normal.        Judgment: Judgment normal.    Lab Results  Component Value Date   TSH 1.41 05/02/2017   Lab Results  Component Value Date   WBC 9.0 11/01/2020   HGB 13.3 11/01/2020   HCT 42.3 11/01/2020   MCV 82 11/01/2020   PLT 306 11/01/2020   Lab Results  Component Value Date   NA 138 11/01/2020   K 3.9 11/01/2020   CO2 23 11/01/2020   GLUCOSE 223 (H) 11/01/2020   BUN 22 11/01/2020   CREATININE 0.96 11/01/2020   BILITOT 0.3 11/01/2020   ALKPHOS 80 11/01/2020   AST 17 11/01/2020   ALT 15 11/01/2020   PROT 7.0 11/01/2020   ALBUMIN 4.2 11/01/2020   CALCIUM 9.6 11/01/2020   ANIONGAP 9 08/24/2019   EGFR 66 11/01/2020   Lab Results  Component Value Date   CHOL 151 11/01/2020   Lab Results  Component Value Date   HDL 56 11/01/2020   Lab Results  Component Value Date   LDLCALC 74 11/01/2020   Lab Results  Component Value Date   TRIG 121 11/01/2020   Lab Results  Component Value Date   CHOLHDL 2.7 11/01/2020   Lab Results  Component Value Date   HGBA1C 7.8 (H) 11/01/2020

## 2021-02-10 LAB — CBC WITH DIFFERENTIAL/PLATELET
Basophils Absolute: 0.1 10*3/uL (ref 0.0–0.2)
Basos: 1 %
EOS (ABSOLUTE): 0.5 10*3/uL — ABNORMAL HIGH (ref 0.0–0.4)
Eos: 5 %
Hematocrit: 43.2 % (ref 34.0–46.6)
Hemoglobin: 14.1 g/dL (ref 11.1–15.9)
Immature Grans (Abs): 0 10*3/uL (ref 0.0–0.1)
Immature Granulocytes: 0 %
Lymphocytes Absolute: 2.8 10*3/uL (ref 0.7–3.1)
Lymphs: 24 %
MCH: 26.1 pg — ABNORMAL LOW (ref 26.6–33.0)
MCHC: 32.6 g/dL (ref 31.5–35.7)
MCV: 80 fL (ref 79–97)
Monocytes Absolute: 0.7 10*3/uL (ref 0.1–0.9)
Monocytes: 6 %
Neutrophils Absolute: 7.5 10*3/uL — ABNORMAL HIGH (ref 1.4–7.0)
Neutrophils: 64 %
Platelets: 314 10*3/uL (ref 150–450)
RBC: 5.4 x10E6/uL — ABNORMAL HIGH (ref 3.77–5.28)
RDW: 14.3 % (ref 11.7–15.4)
WBC: 11.7 10*3/uL — ABNORMAL HIGH (ref 3.4–10.8)

## 2021-02-10 LAB — LIPID PANEL
Chol/HDL Ratio: 2.5 ratio (ref 0.0–4.4)
Cholesterol, Total: 158 mg/dL (ref 100–199)
HDL: 63 mg/dL (ref >39)
LDL Chol Calc (NIH): 72 mg/dL (ref 0–99)
Triglycerides: 130 mg/dL (ref 0–149)
VLDL Cholesterol Cal: 23 mg/dL (ref 5–40)

## 2021-02-10 LAB — CMP14+EGFR
ALT: 17 IU/L (ref 0–32)
AST: 20 IU/L (ref 0–40)
Albumin/Globulin Ratio: 1.6 (ref 1.2–2.2)
Albumin: 4.5 g/dL (ref 3.8–4.8)
Alkaline Phosphatase: 82 IU/L (ref 44–121)
BUN/Creatinine Ratio: 24 (ref 12–28)
BUN: 21 mg/dL (ref 8–27)
Bilirubin Total: 0.2 mg/dL (ref 0.0–1.2)
CO2: 24 mmol/L (ref 20–29)
Calcium: 9.8 mg/dL (ref 8.7–10.3)
Chloride: 100 mmol/L (ref 96–106)
Creatinine, Ser: 0.87 mg/dL (ref 0.57–1.00)
Globulin, Total: 2.8 g/dL (ref 1.5–4.5)
Glucose: 103 mg/dL — ABNORMAL HIGH (ref 70–99)
Potassium: 3.9 mmol/L (ref 3.5–5.2)
Sodium: 139 mmol/L (ref 134–144)
Total Protein: 7.3 g/dL (ref 6.0–8.5)
eGFR: 74 mL/min/{1.73_m2} (ref >59)

## 2021-02-12 ENCOUNTER — Encounter: Payer: Self-pay | Admitting: Family Medicine

## 2021-02-16 NOTE — Progress Notes (Signed)
Received notification from [facility] CARES regarding approval for HUMULIN 70/30 KWIKPEN. Patient assistance approved from 02/15/21 to 12/31/21.  Phone: [phone]

## 2021-02-22 ENCOUNTER — Ambulatory Visit: Payer: Medicare Other | Attending: Family Medicine

## 2021-02-22 ENCOUNTER — Other Ambulatory Visit: Payer: Self-pay

## 2021-02-22 DIAGNOSIS — G8929 Other chronic pain: Secondary | ICD-10-CM | POA: Insufficient documentation

## 2021-02-22 DIAGNOSIS — M25611 Stiffness of right shoulder, not elsewhere classified: Secondary | ICD-10-CM | POA: Diagnosis not present

## 2021-02-22 DIAGNOSIS — M6281 Muscle weakness (generalized): Secondary | ICD-10-CM | POA: Diagnosis not present

## 2021-02-22 DIAGNOSIS — M79601 Pain in right arm: Secondary | ICD-10-CM | POA: Diagnosis not present

## 2021-02-22 DIAGNOSIS — M25511 Pain in right shoulder: Secondary | ICD-10-CM | POA: Diagnosis not present

## 2021-02-22 NOTE — Therapy (Signed)
[facility] [address] [place], [place], [address] Phone: [phone]   Fax:  [phone]  Physical Therapy Evaluation  Patient Details  Name: Sara Stuart MRN: [id number] Date of Birth: 03-30-1955 Referring Provider (PT): Alona Bene, [place]   Encounter Date: 02/22/2021   PT End of Session - 02/22/21 1031     Visit Number 1    Number of Visits 6    Date for PT Re-Evaluation 04/07/21    PT Start Time 1032    PT Stop Time 1110    PT Time Calculation (min) 38 min    Activity Tolerance Patient tolerated treatment well    Behavior During Therapy [facility] for tasks assessed/performed             Past Medical History:  Diagnosis Date   Arthritis    Chronic back pain    spondylolishthesis   DDD (degenerative disc disease), lumbosacral    Diabetes mellitus without complication (HCC) diagnosed in 02/2014   takes Metformin and Glipizide daily   GERD (gastroesophageal reflux disease)    takes Omeprazole daily   Gout    takes Colchicine daily as needed   Hypertension    takes Dyazide and Diltiazem daily   Nocturia    Polio    PONV (postoperative nausea and vomiting)    Wears glasses     Past Surgical History:  Procedure Laterality Date   BACK SURGERY  07/2012   bladder tacked      COLONOSCOPY     FOOT SURGERY     more than 5 surgeries on right foot related to polio   surgery for polio     states x 5    TOTAL ABDOMINAL HYSTERECTOMY  2002   TUBAL PREGNANCY   TUBAL LIGATION      There were no vitals filed for this visit.    Subjective Assessment - 02/22/21 1032     Subjective Patient reports that her right arm is hurting quite a bit today. She notes that she had tried to come to therapy last year, but her insurance was unable to cover it and she was not able to continue with therapy. She notes that her arm has gotten a lot worse in the last few months. She notes that this first started when pulling a buggy full of groceries up some  steps when she felt a pop in her right shoulder. She thinks she had about 12 cases of drinks in this buggy.    Pertinent History DM, GERD, HTN, DDD, back and foot surgery, polio    Limitations House hold activities;Lifting    Patient Stated Goals reduced pain, yardwork, and cleaning her house    Currently in Pain? Yes    Pain Score 10-Worst pain ever    Pain Location Shoulder    Pain Orientation Right    Pain Descriptors / Indicators Aching;Burning    Pain Type Chronic pain    Pain Radiating Towards to her entire right hand from her right shoulder    Pain Onset More than a month ago    Pain Frequency Constant    Aggravating Factors  lifting and carry groceries and trash, cleaning around her house    Pain Relieving Factors reaching overhead, heat    Effect of Pain on Daily Activities unable to sleep well due to her pain,                [facility] PT Assessment - 02/22/21 0001  Assessment   Medical Diagnosis Chronic right shoulder pain; right arm pain    Referring Provider (PT) Alona Bene, FNP    Onset Date/Surgical Date 06/29/20    Hand Dominance Right    Next MD Visit None    Prior Therapy 1 vist last year      Precautions   Precautions None      Restrictions   Weight Bearing Restrictions No      Balance Screen   Has the patient fallen in the past 6 months No    Has the patient had a decrease in activity level because of a fear of falling?  No    Is the patient reluctant to leave their home because of a fear of falling?  No      Home Tourist information centre manager residence    Living Arrangements Alone      Prior Function   Level of Independence Independent    Leisure yardwork and cleaning around her house      Cognition   Overall Cognitive Status Within Functional Limits for tasks assessed    Attention Focused    Focused Attention Appears intact    Memory Appears intact    Awareness Appears intact    Problem Solving Appears intact      Sensation    Light Touch Impaired by gross assessment   Patient reported inconsistent numbness at right deltoid   Additional Comments Patient reports some numbness and tingling in her right shoulder.      ROM / Strength   AROM / PROM / Strength AROM;Strength      AROM   AROM Assessment Site Shoulder    Right/Left Shoulder Left;Right    Right Shoulder Flexion 148 Degrees   throbbing when lowering arm   Right Shoulder ABduction 158 Degrees   pain lowering arm   Right Shoulder Internal Rotation --   to abdomen (at neutral); L4 (functional)   Right Shoulder External Rotation 50 Degrees   at neutral; spine of scapula (functionally)   Left Shoulder Flexion 156 Degrees    Left Shoulder ABduction 151 Degrees    Left Shoulder Internal Rotation --   to abdoment (at neutral); T7 (functionally)   Left Shoulder External Rotation 71 Degrees   at neutral; T3 (functionally)     Strength   Strength Assessment Site Shoulder;Hand    Right/Left Shoulder Left;Right    Right Shoulder Flexion 3+/5   painful pulling in right shoulder   Right Shoulder ABduction 4-/5   painful   Right Shoulder Internal Rotation 4-/5   "pinch" in forearm   Right Shoulder External Rotation 3/5   painful   Left Shoulder Flexion 4-/5    Left Shoulder ABduction 4/5    Left Shoulder Internal Rotation 4/5    Left Shoulder External Rotation 4/5    Right/Left hand Right;Left    Right Hand Grip (lbs) 33    Left Hand Grip (lbs) 50                        Objective measurements completed on examination: See above findings.                     PT Long Term Goals - 02/22/21 1233       PT LONG TERM GOAL #1   Title Patient will be independent with her HEP.    Time 4    Period Weeks    Status New  Target Date 03/22/21      PT LONG TERM GOAL #2   Title Patient will be able to complete her daily activities without her right shoulder pain exceeding 7/10.    Time 4    Period Weeks    Status New    Target  Date 03/22/21      PT LONG TERM GOAL #3   Title Patient will be able to sleep without being awakened by her familiar right shoulder pain.    Time 4    Period Weeks    Status New    Target Date 03/22/21                    Plan - 02/22/21 1229     Clinical Impression Statement Patient is a 66 year old female presenting to physical therapy with right shoulder and upper extremity pain. She presented today with moderate pain severity and irritability with manual muscle testing and AROM. She also exhibited significant weakness with internal and external testing compared to the right. These are consistant with a rotator cuff injury. Recommend that she continue with skilled physical therapy to address her remaining impairments to maximize her functional mobility.    Personal Factors and Comorbidities Comorbidity 3+;Time since onset of injury/illness/exacerbation;Finances    Comorbidities DM, GERD, HTN, DDD, back and foot surgery, and history of polio    Examination-Activity Limitations Sleep;Lift    Examination-Participation Restrictions Cleaning;Yard Work    Conservation officer, historic buildings Evolving/Moderate complexity    Clinical Decision Making Moderate    Rehab Potential Fair    PT Frequency 2x / week   1-2x/ week   PT Duration 4 weeks    PT Treatment/Interventions ADLs/Self Care Home Management;Cryotherapy;Electrical Stimulation;Moist Heat;Therapeutic activities;Therapeutic exercise;Neuromuscular re-education;Manual techniques;Patient/family education;Passive range of motion;Vasopneumatic Device;Taping    PT Next Visit Plan provided HEP to include isometrics, AAROM, and light strengthening, modalities as needed    Consulted and Agree with Plan of Care Patient             Patient will benefit from skilled therapeutic intervention in order to improve the following deficits and impairments:  Decreased range of motion, Impaired UE functional use, Decreased activity tolerance,  Pain, Impaired sensation, Decreased strength  Visit Diagnosis: Chronic right shoulder pain  Muscle weakness (generalized)  Stiffness of right shoulder, not elsewhere classified     Problem List Patient Active Problem List   Diagnosis Date Noted   Aortic atherosclerosis (HCC) 11/01/2020   Pain of right upper extremity 06/28/2020   Insomnia 12/18/2019   Coronary artery disease involving native coronary artery of native heart without angina pectoris 02/03/2019   Mixed hyperlipidemia 10/10/2017   Uncontrolled type 2 diabetes mellitus with hyperglycemia, with long-term current use of insulin (HCC) 03/14/2017   Essential hypertension, benign 03/14/2017   GERD (gastroesophageal reflux disease) 12/29/2012   History of gout 12/29/2012   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

## 2021-02-24 ENCOUNTER — Telehealth: Payer: Self-pay | Admitting: Pharmacist

## 2021-02-24 NOTE — Telephone Encounter (Signed)
done

## 2021-02-24 NOTE — Telephone Encounter (Signed)
Please put #1box on tresiba samples in fridge for patient to pick up I know we got some in yesterday Patient already aware and will pick up Monday Patient is taking 25 units daily (at bedtime)  Thank you! Almyra Free

## 2021-02-27 ENCOUNTER — Other Ambulatory Visit: Payer: Self-pay

## 2021-02-27 ENCOUNTER — Ambulatory Visit: Payer: Medicare Other

## 2021-02-27 DIAGNOSIS — M25511 Pain in right shoulder: Secondary | ICD-10-CM

## 2021-02-27 DIAGNOSIS — M6281 Muscle weakness (generalized): Secondary | ICD-10-CM

## 2021-02-27 DIAGNOSIS — G8929 Other chronic pain: Secondary | ICD-10-CM | POA: Diagnosis not present

## 2021-02-27 DIAGNOSIS — M79601 Pain in right arm: Secondary | ICD-10-CM | POA: Diagnosis not present

## 2021-02-27 DIAGNOSIS — M25611 Stiffness of right shoulder, not elsewhere classified: Secondary | ICD-10-CM

## 2021-02-27 NOTE — Therapy (Signed)
[facility] [place], [place], [address] Phone: [phone]   Fax:  [phone]  Physical Therapy Treatment  Patient Details  Name: Sara Stuart MRN: [id number] Date of Birth: April 06, 1955 Referring Provider (PT): Blanch Media, [place]   Encounter Date: 02/27/2021   PT End of Session - 02/27/21 1053     Visit Number 2    Number of Visits 6    Date for PT Re-Evaluation 04/07/21    PT Start Time 1055    PT Stop Time 1138    PT Time Calculation (min) 43 min    Activity Tolerance Patient tolerated treatment well    Behavior During Therapy [facility] for tasks assessed/performed             Past Medical History:  Diagnosis Date   Arthritis    Chronic back pain    spondylolishthesis   DDD (degenerative disc disease), lumbosacral    Diabetes mellitus without complication ([place]) diagnosed in 02/2014   takes Metformin and Glipizide daily   GERD (gastroesophageal reflux disease)    takes Omeprazole daily   Gout    takes Colchicine daily as needed   Hypertension    takes Dyazide and Diltiazem daily   Nocturia    Polio    PONV (postoperative nausea and vomiting)    Wears glasses     Past Surgical History:  Procedure Laterality Date   BACK SURGERY  07/2012   bladder tacked      COLONOSCOPY     FOOT SURGERY     more than 5 surgeries on right foot related to polio   surgery for polio     states x 5    TOTAL ABDOMINAL HYSTERECTOMY  2002   TUBAL PREGNANCY   TUBAL LIGATION      There were no vitals filed for this visit.   Subjective Assessment - 02/27/21 1053     Subjective Pt arrives for today's treatment session reporting 6/10 right shoulder pain.    Pertinent History DM, GERD, HTN, DDD, back and foot surgery, polio    Limitations House hold activities;Lifting    Patient Stated Goals reduced pain, yardwork, and cleaning her house    Currently in Pain? Yes    Pain Score 6     Pain Location Shoulder    Pain Orientation Right     Pain Onset More than a month ago                               [facility] Adult PT Treatment/Exercise - 02/27/21 0001       Exercises   Exercises Shoulder      Shoulder Exercises: Supine   Protraction AAROM;10 reps   with cane   External Rotation AAROM;10 reps   with cane   Flexion AAROM;10 reps   with cane   Other Supine Exercises chest press x 10 reps AAROM with cane      Shoulder Exercises: Seated   Extension Strengthening;10 reps;Theraband    Theraband Level (Shoulder Extension) Level 2 (Red)    Row Strengthening;Both;10 reps;Theraband    Theraband Level (Shoulder Row) Level 1 (Yellow)    Horizontal ABduction Strengthening;Both;10 reps;Theraband    Theraband Level (Shoulder Horizontal ABduction) Level 1 (Yellow)    External Rotation Strengthening;Both;10 reps;Theraband    Theraband Level (Shoulder External Rotation) Level 1 (Yellow)      Shoulder Exercises: Standing   Other Standing Exercises Wall ladder x 5  max #24     Shoulder Exercises: Pulleys   Flexion 3 minutes      Shoulder Exercises: ROM/Strengthening   UBE (Upper Arm Bike) 120 RPM 8 ins (foward/backward)    Ranger Flexion/extension, CW/CCW circles x 2 mins each      Shoulder Exercises: Isometric Strengthening   Flexion 5X5"   2 sets   Extension 5X5"   2 sets   External Rotation 5X5"   2 sets   Internal Rotation 5X5"   2 sets     Manual Therapy   Manual Therapy Soft tissue mobilization    Soft tissue mobilization STW/M to right bicep and tricep to decrease pain and tone                          PT Long Term Goals - 02/22/21 1233       PT LONG TERM GOAL #1   Title Patient will be independent with her HEP.    Time 4    Period Weeks    Status New    Target Date 03/22/21      PT LONG TERM GOAL #2   Title Patient will be able to complete her daily activities without her right shoulder pain exceeding 7/10.    Time 4    Period Weeks    Status New    Target Date  03/22/21      PT LONG TERM GOAL #3   Title Patient will be able to sleep without being awakened by her familiar right shoulder pain.    Time 4    Period Weeks    Status New    Target Date 03/22/21                   Plan - 02/27/21 1054     Clinical Impression Statement Pt arrives for today's treatment session reporting 6/10 right shoulder pain.  Pt able to tolerate introduction to UBE for warmup without increase in pain.  Pt instructed in various seated AA exercises as well as seated strengthening exercises to increase strength and function while decreasing pain.  Pt requiring min cues for all newly added exercises for posture and proper technique. Pt also instructed in supine AAROM with use of cane to assist with motions.  Pt instructed to remain within pain free ROM.  Pt given printed HEP and tband for home use.  Pt instructed that if pain produces pain > 5/10 to discontinue that particular exercise that day and retry it the next day.  Pt reported 6/10 right shoulder pain at completion of today's treatment session.    Personal Factors and Comorbidities Comorbidity 3+;Time since onset of injury/illness/exacerbation;Finances    Comorbidities DM, GERD, HTN, DDD, back and foot surgery, and history of polio    Examination-Activity Limitations Sleep;Lift    Examination-Participation Restrictions Cleaning;Yard Work    [employer] Evolving/Moderate complexity    Rehab Potential Fair    PT Frequency 2x / week   1-2x/ week   PT Duration 4 weeks    PT Treatment/Interventions ADLs/Self Care Home Management;Cryotherapy;Electrical Stimulation;Moist Heat;Therapeutic activities;Therapeutic exercise;Neuromuscular re-education;Manual techniques;Patient/family education;Passive range of motion;Vasopneumatic Device;Taping    PT Next Visit Plan provided HEP to include isometrics, AAROM, and light strengthening, modalities as needed    Consulted and Agree with Plan of Care Patient              Patient will benefit from skilled therapeutic intervention in order to improve the  following deficits and impairments:  Decreased range of motion, Impaired UE functional use, Decreased activity tolerance, Pain, Impaired sensation, Decreased strength  Visit Diagnosis: Chronic right shoulder pain  Muscle weakness (generalized)  Stiffness of right shoulder, not elsewhere classified  Acute pain of right shoulder     Problem List Patient Active Problem List   Diagnosis Date Noted   Aortic atherosclerosis ([place]) 11/01/2020   Pain of right upper extremity 06/28/2020   Insomnia 12/18/2019   Coronary artery disease involving native coronary artery of native heart without angina pectoris 02/03/2019   Mixed hyperlipidemia 10/10/2017   Uncontrolled type 2 diabetes mellitus with hyperglycemia, with long-term current use of insulin ([place]) 03/14/2017   Essential hypertension, benign 03/14/2017   GERD (gastroesophageal reflux disease) 12/29/2012   History of gout 12/29/2012   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   Morbid obesity ([place]) 12/29/2012   Degenerative spondylolisthesis 07/08/2012   Spinal stenosis, lumbar region, with neurogenic claudication 07/08/2012    Kathrynn Ducking, PTA 02/27/2021, 11:48 AM  [facility] [place], [place], [address] Phone: [phone]   Fax:  [phone]  Name: Sara Stuart MRN: [id number] Date of Birth: 1955/11/16

## 2021-02-28 DIAGNOSIS — E782 Mixed hyperlipidemia: Secondary | ICD-10-CM

## 2021-02-28 DIAGNOSIS — Z794 Long term (current) use of insulin: Secondary | ICD-10-CM

## 2021-02-28 DIAGNOSIS — I10 Essential (primary) hypertension: Secondary | ICD-10-CM | POA: Diagnosis not present

## 2021-02-28 DIAGNOSIS — E1165 Type 2 diabetes mellitus with hyperglycemia: Secondary | ICD-10-CM | POA: Diagnosis not present

## 2021-03-02 ENCOUNTER — Ambulatory Visit: Payer: Medicare Other | Attending: Family Medicine

## 2021-03-02 ENCOUNTER — Other Ambulatory Visit: Payer: Self-pay

## 2021-03-02 DIAGNOSIS — M25511 Pain in right shoulder: Secondary | ICD-10-CM | POA: Diagnosis not present

## 2021-03-02 DIAGNOSIS — M25611 Stiffness of right shoulder, not elsewhere classified: Secondary | ICD-10-CM | POA: Insufficient documentation

## 2021-03-02 DIAGNOSIS — M6281 Muscle weakness (generalized): Secondary | ICD-10-CM | POA: Insufficient documentation

## 2021-03-02 DIAGNOSIS — G8929 Other chronic pain: Secondary | ICD-10-CM | POA: Insufficient documentation

## 2021-03-02 NOTE — Therapy (Signed)
[place] [facility] ?[address] ?[place], [place], [address] ?Phone: [phone]   Fax:  [phone] ? ?Physical Therapy Treatment ? ?Patient Details  ?Name: Sara Stuart ?MRN: [id number] ?Date of Birth: 1955/06/07 ?Referring Provider (PT): Alona Bene, FNP ? ? ?Encounter Date: 03/02/2021 ? ? PT End of Session - 03/02/21 1048   ? ? Visit Number 3   ? Number of Visits 6   ? Date for PT Re-Evaluation 04/07/21   ? PT Start Time 1046   ? PT Stop Time 1140   ? PT Time Calculation (min) 54 min   ? Activity Tolerance Patient tolerated treatment well   ? Behavior During Therapy [facility] for tasks assessed/performed   ? ?  ?  ? ?  ? ? ?Past Medical History:  ?Diagnosis Date  ? Arthritis   ? Chronic back pain   ? spondylolishthesis  ? DDD (degenerative disc disease), lumbosacral   ? Diabetes mellitus without complication (HCC) diagnosed in 02/2014  ? takes Metformin and Glipizide daily  ? GERD (gastroesophageal reflux disease)   ? takes Omeprazole daily  ? Gout   ? takes Colchicine daily as needed  ? Hypertension   ? takes Dyazide and Diltiazem daily  ? Nocturia   ? Polio   ? PONV (postoperative nausea and vomiting)   ? Wears glasses   ? ? ?Past Surgical History:  ?Procedure Laterality Date  ? BACK SURGERY  07/2012  ? bladder tacked     ? COLONOSCOPY    ? FOOT SURGERY    ? more than 5 surgeries on right foot related to polio  ? surgery for polio    ? states x 5   ? TOTAL ABDOMINAL HYSTERECTOMY  2002  ? TUBAL PREGNANCY  ? TUBAL LIGATION    ? ? ?There were no vitals filed for this visit. ? ? Subjective Assessment - 03/02/21 1047   ? ? Subjective Pt arrives for today's treatment session reporting 5/10 right shoulder pain.  Pt stated that her shoulder was very sore the night after last treatment session requiring pain medication and use of heating pad.   ? Pertinent History DM, GERD, HTN, DDD, back and foot surgery, polio   ? Limitations House hold activities;Lifting   ? Patient Stated Goals reduced pain,  yardwork, and cleaning her house   ? Currently in Pain? Yes   ? Pain Score 5    ? Pain Location Shoulder   ? Pain Orientation Right   ? Pain Onset More than a month ago   ? ?  ?  ? ?  ? ? ? ? ? ? ? ? ? ? ? ? ? ? ? ? ? ? ? ? [facility] Adult PT Treatment/Exercise - 03/02/21 0001   ? ?  ? Shoulder Exercises: Supine  ? Protraction AAROM;15 reps   cane  ? External Rotation AAROM;15 reps   cane  ? Flexion AAROM;15 reps   cane  ? Other Supine Exercises chest press x 15 reps AAROM with cane   ?  ? Shoulder Exercises: Pulleys  ? Flexion 3 minutes   ?  ? Shoulder Exercises: ROM/Strengthening  ? UBE (Upper Arm Bike) 120 RPM 10 mins (forward/backward)   ? Ranger Flexion/extension, CW/CCW circles x 2 mins each   ?  ? Modalities  ? Modalities Electrical Stimulation;Vasopneumatic   ?  ? Electrical Stimulation  ? Electrical Stimulation Location Right Bicep   ? Electrical Stimulation Action IFC   ? Electrical Stimulation Parameters 80-150  Hz x 15 mins   ? Electrical Stimulation Goals Pain;Tone   ?  ? Vasopneumatic  ? Number Minutes Vasopneumatic  15 minutes   ? Vasopnuematic Location  Shoulder   ? Vasopneumatic Pressure Low   ? Vasopneumatic Temperature  34   ? ?  ?  ? ?  ? ? ? ? ? ? ? ? ? ? ? ? ? ? ? PT Long Term Goals - 02/22/21 1233   ? ?  ? PT LONG TERM GOAL #1  ? Title Patient will be independent with her HEP.   ? Time 4   ? Period Weeks   ? Status New   ? Target Date 03/22/21   ?  ? PT LONG TERM GOAL #2  ? Title Patient will be able to complete her daily activities without her right shoulder pain exceeding 7/10.   ? Time 4   ? Period Weeks   ? Status New   ? Target Date 03/22/21   ?  ? PT LONG TERM GOAL #3  ? Title Patient will be able to sleep without being awakened by her familiar right shoulder pain.   ? Time 4   ? Period Weeks   ? Status New   ? Target Date 03/22/21   ? ?  ?  ? ?  ? ? ? ? ? ? ? ? Plan - 03/02/21 1049   ? ? Clinical Impression Statement Pt arrives for today's treatment session reporting 5/10 right shoulder/bicep  pain.  Pt states that she has been doing her exercises at home and only had issues with resisted ER.  Pt able to perform supine AAROM with cane without increase in pain, but requiring min cues to remain within pain free ROM.  Pt introduced to estim and vaso to decrease right bicep/shoulder pain and tone.  Normal responses to modalities noted upon removal.  Pt reported 1/10 right bicep pain at completion of today's treatment session.   ? Personal Factors and Comorbidities Comorbidity 3+;Time since onset of injury/illness/exacerbation;Finances   ? Comorbidities DM, GERD, HTN, DDD, back and foot surgery, and history of polio   ? Examination-Activity Limitations Sleep;Lift   ? Examination-Participation Restrictions Cleaning;Pincus Badder Work   ? Stability/Clinical Decision Making Evolving/Moderate complexity   ? Rehab Potential Fair   ? PT Frequency 2x / week   1-2x/ week  ? PT Duration 4 weeks   ? PT Treatment/Interventions ADLs/Self Care Home Management;Cryotherapy;Electrical Stimulation;Moist Heat;Therapeutic activities;Therapeutic exercise;Neuromuscular re-education;Manual techniques;Patient/family education;Passive range of motion;Vasopneumatic Device;Taping   ? PT Next Visit Plan provided HEP to include isometrics, AAROM, and light strengthening, modalities as needed   ? Consulted and Agree with Plan of Care Patient   ? ?  ?  ? ?  ? ? ?Patient will benefit from skilled therapeutic intervention in order to improve the following deficits and impairments:  Decreased range of motion, Impaired UE functional use, Decreased activity tolerance, Pain, Impaired sensation, Decreased strength ? ?Visit Diagnosis: ?Chronic right shoulder pain ? ?Muscle weakness (generalized) ? ?Stiffness of right shoulder, not elsewhere classified ? ?Acute pain of right shoulder ? ? ? ? ?Problem List ?Patient Active Problem List  ? Diagnosis Date Noted  ? Aortic atherosclerosis (HCC) 11/01/2020  ? Pain of right upper extremity 06/28/2020  ? Insomnia  12/18/2019  ? Coronary artery disease involving native coronary artery of native heart without angina pectoris 02/03/2019  ? Mixed hyperlipidemia 10/10/2017  ? Uncontrolled type 2 diabetes mellitus with hyperglycemia, with long-term current use of insulin (  HCC) 03/14/2017  ? Essential hypertension, benign 03/14/2017  ? GERD (gastroesophageal reflux disease) 12/29/2012  ? History of gout 12/29/2012  ? 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  ? Morbid obesity (HCC) 12/29/2012  ? Degenerative spondylolisthesis 07/08/2012  ? Spinal stenosis, lumbar region, with neurogenic claudication 07/08/2012  ? ? ?Newman Pies, PTA ?03/02/2021, 11:44 AM ? ?[place] [facility] ?[address] ?[place], [place], [address] ?Phone: [phone]   Fax:  [phone] ? ?Name: Sara Stuart ?MRN: [id number] ?Date of Birth: 1955/07/20 ? ? ? ?

## 2021-03-08 ENCOUNTER — Ambulatory Visit: Payer: Medicare Other

## 2021-03-08 ENCOUNTER — Other Ambulatory Visit: Payer: Self-pay

## 2021-03-08 DIAGNOSIS — M25611 Stiffness of right shoulder, not elsewhere classified: Secondary | ICD-10-CM

## 2021-03-08 DIAGNOSIS — G8929 Other chronic pain: Secondary | ICD-10-CM

## 2021-03-08 DIAGNOSIS — M6281 Muscle weakness (generalized): Secondary | ICD-10-CM

## 2021-03-08 DIAGNOSIS — M25511 Pain in right shoulder: Secondary | ICD-10-CM | POA: Diagnosis not present

## 2021-03-08 NOTE — Therapy (Signed)
?[facility] ?[address] ?[place], [place], [address] ?Phone: [phone]   Fax:  [phone] ? ?Physical Therapy Treatment ? ?Patient Details  ?Name: Sara Stuart ?MRN: [id number] ?Date of Birth: May 29, 1955 ?Referring Provider (PT): Alona Bene, FNP ? ? ?Encounter Date: 03/08/2021 ? ? PT End of Session - 03/08/21 1106   ? ? Visit Number 4   ? Number of Visits 6   ? Date for PT Re-Evaluation 04/07/21   ? PT Start Time 1115   ? Activity Tolerance Patient tolerated treatment well   ? Behavior During Therapy [facility] for tasks assessed/performed   ? ?  ?  ? ?  ? ? ?Past Medical History:  ?Diagnosis Date  ? Arthritis   ? Chronic back pain   ? spondylolishthesis  ? DDD (degenerative disc disease), lumbosacral   ? Diabetes mellitus without complication (HCC) diagnosed in 02/2014  ? takes Metformin and Glipizide daily  ? GERD (gastroesophageal reflux disease)   ? takes Omeprazole daily  ? Gout   ? takes Colchicine daily as needed  ? Hypertension   ? takes Dyazide and Diltiazem daily  ? Nocturia   ? Polio   ? PONV (postoperative nausea and vomiting)   ? Wears glasses   ? ? ?Past Surgical History:  ?Procedure Laterality Date  ? BACK SURGERY  07/2012  ? bladder tacked     ? COLONOSCOPY    ? FOOT SURGERY    ? more than 5 surgeries on right foot related to polio  ? surgery for polio    ? states x 5   ? TOTAL ABDOMINAL HYSTERECTOMY  2002  ? TUBAL PREGNANCY  ? TUBAL LIGATION    ? ? ?There were no vitals filed for this visit. ? ? Subjective Assessment - 03/08/21 1106   ? ? Subjective Patient reports that her pain is located along her bicep today.   ? Pertinent History DM, GERD, HTN, DDD, back and foot surgery, polio   ? Limitations House hold activities;Lifting   ? Patient Stated Goals reduced pain, yardwork, and cleaning her house   ? Currently in Pain? Yes   ? Pain Score 4    ? Pain Location Shoulder   ? Pain Onset More than a month ago   ? ?  ?  ? ?  ? ? ? ? ? ? ? ? ? ? ? ? ? ? ? ? ? ? ? ? [facility] Adult  PT Treatment/Exercise - 03/08/21 0001   ? ?  ? Elbow Exercises  ? Elbow Flexion Right;20 reps;Seated   ?  ? Shoulder Exercises: Seated  ? Retraction Both   3 minutes  ?  ? Shoulder Exercises: ROM/Strengthening  ? UBE (Upper Arm Bike) 120 RPM x 8 minutes   ?  ? Shoulder Exercises: Isometric Strengthening  ? Other Isometric Exercises Elbow flexion   5 second hold; 25 reps  ?  ? Electrical Stimulation  ? Electrical Stimulation Location Right Bicep   ? Electrical Stimulation Action pre mod   ? Electrical Stimulation Parameters 80-150 Hz x 15 minutes   ? Electrical Stimulation Goals Pain;Tone   ?  ? Vasopneumatic  ? Number Minutes Vasopneumatic  10 minutes   ? Vasopnuematic Location  Shoulder   ? Vasopneumatic Pressure Low   ? Vasopneumatic Temperature  34   ?  ? Manual Therapy  ? Manual Therapy Soft tissue mobilization   ? Soft tissue mobilization to right bicep   ? ?  ?  ? ?  ? ? ? ? ? ? ? ? ? ? ? ? ? ? ?  PT Long Term Goals - 02/22/21 1233   ? ?  ? PT LONG TERM GOAL #1  ? Title Patient will be independent with her HEP.   ? Time 4   ? Period Weeks   ? Status New   ? Target Date 03/22/21   ?  ? PT LONG TERM GOAL #2  ? Title Patient will be able to complete her daily activities without her right shoulder pain exceeding 7/10.   ? Time 4   ? Period Weeks   ? Status New   ? Target Date 03/22/21   ?  ? PT LONG TERM GOAL #3  ? Title Patient will be able to sleep without being awakened by her familiar right shoulder pain.   ? Time 4   ? Period Weeks   ? Status New   ? Target Date 03/22/21   ? ?  ?  ? ?  ? ? ? ? ? ? ? ? Plan - 03/08/21 1106   ? ? Clinical Impression Statement Treatment focused on soft tissue mobilization to the right bicep and appropriately matched interventions to facilitate pain free bicep engagement. She required minimal cuing with elbow flexion isometric to facilitate appropriate force. These were able to slightly reduce her familiar symptoms. She reported feeling better upon the conclusion of treatment. She  continues to require skilled physical therapy to address her remaining impairments to maximize her functional mobility.   ? Personal Factors and Comorbidities Comorbidity 3+;Time since onset of injury/illness/exacerbation;Finances   ? Comorbidities DM, GERD, HTN, DDD, back and foot surgery, and history of polio   ? Examination-Activity Limitations Sleep;Lift   ? Examination-Participation Restrictions Cleaning;Pincus Badder Work   ? Stability/Clinical Decision Making Evolving/Moderate complexity   ? Rehab Potential Fair   ? PT Frequency 2x / week   1-2x/ week  ? PT Duration 4 weeks   ? PT Treatment/Interventions ADLs/Self Care Home Management;Cryotherapy;Electrical Stimulation;Moist Heat;Therapeutic activities;Therapeutic exercise;Neuromuscular re-education;Manual techniques;Patient/family education;Passive range of motion;Vasopneumatic Device;Taping   ? PT Next Visit Plan provided HEP to include isometrics, AAROM, and light strengthening, modalities as needed   ? Consulted and Agree with Plan of Care Patient   ? ?  ?  ? ?  ? ? ?Patient will benefit from skilled therapeutic intervention in order to improve the following deficits and impairments:  Decreased range of motion, Impaired UE functional use, Decreased activity tolerance, Pain, Impaired sensation, Decreased strength ? ?Visit Diagnosis: ?Chronic right shoulder pain ? ?Muscle weakness (generalized) ? ?Stiffness of right shoulder, not elsewhere classified ? ? ? ? ?Problem List ?Patient Active Problem List  ? Diagnosis Date Noted  ? Aortic atherosclerosis (HCC) 11/01/2020  ? Pain of right upper extremity 06/28/2020  ? Insomnia 12/18/2019  ? Coronary artery disease involving native coronary artery of native heart without angina pectoris 02/03/2019  ? Mixed hyperlipidemia 10/10/2017  ? Uncontrolled type 2 diabetes mellitus with hyperglycemia, with long-term current use of insulin (HCC) 03/14/2017  ? Essential hypertension, benign 03/14/2017  ? GERD (gastroesophageal reflux  disease) 12/29/2012  ? History of gout 12/29/2012  ? 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  ? Morbid obesity (HCC) 12/29/2012  ? Degenerative spondylolisthesis 07/08/2012  ? Spinal stenosis, lumbar region, with neurogenic claudication 07/08/2012  ? ? ?Granville Lewis, PT ?03/08/2021, 12:09 PM ? ?[place] ?[facility] ?[address] ?[place], [place], [address] ?Phone: [phone]   Fax:  [phone] ? ?Name: Ayiana Winslett ?MRN: [id number] ?Date of Birth: 28-Sep-1955 ? ? ? ?

## 2021-03-13 ENCOUNTER — Ambulatory Visit: Payer: Medicare Other

## 2021-03-13 ENCOUNTER — Other Ambulatory Visit: Payer: Self-pay | Admitting: Family Medicine

## 2021-03-13 ENCOUNTER — Other Ambulatory Visit: Payer: Self-pay

## 2021-03-13 DIAGNOSIS — M25511 Pain in right shoulder: Secondary | ICD-10-CM

## 2021-03-13 DIAGNOSIS — M6281 Muscle weakness (generalized): Secondary | ICD-10-CM | POA: Diagnosis not present

## 2021-03-13 DIAGNOSIS — E782 Mixed hyperlipidemia: Secondary | ICD-10-CM

## 2021-03-13 DIAGNOSIS — M25611 Stiffness of right shoulder, not elsewhere classified: Secondary | ICD-10-CM | POA: Diagnosis not present

## 2021-03-13 DIAGNOSIS — I2510 Atherosclerotic heart disease of native coronary artery without angina pectoris: Secondary | ICD-10-CM

## 2021-03-13 DIAGNOSIS — G8929 Other chronic pain: Secondary | ICD-10-CM

## 2021-03-13 NOTE — Therapy (Signed)
[place] ?[facility] ?[address] ?[place], [place], [address] ?Phone: [phone]   Fax:  [phone] ? ?Physical Therapy Treatment ? ?Patient Details  ?Name: Sara Stuart ?MRN: [id number] ?Date of Birth: [birth year] ?Referring Provider (PT): Blanch Media, FNP ? ? ?Encounter Date: 03/13/2021 ? ? PT End of Session - 03/13/21 1102   ? ? Visit Number 5   ? Number of Visits 6   ? Date for PT Re-Evaluation 04/07/21   ? PT Start Time 1115   ? PT Stop Time [id number]   ? PT Time Calculation (min) 43 min   ? Activity Tolerance Patient tolerated treatment well   ? Behavior During Therapy [facility] for tasks assessed/performed   ? ?  ?  ? ?  ? ? ?Past Medical History:  ?Diagnosis Date  ? Arthritis   ? Chronic back pain   ? spondylolishthesis  ? DDD (degenerative disc disease), lumbosacral   ? Diabetes mellitus without complication ([place]) diagnosed in 02/2014  ? takes Metformin and Glipizide daily  ? GERD (gastroesophageal reflux disease)   ? takes Omeprazole daily  ? Gout   ? takes Colchicine daily as needed  ? Hypertension   ? takes Dyazide and Diltiazem daily  ? Nocturia   ? Polio   ? PONV (postoperative nausea and vomiting)   ? Wears glasses   ? ? ?Past Surgical History:  ?Procedure Laterality Date  ? BACK SURGERY  07/2012  ? bladder tacked     ? COLONOSCOPY    ? FOOT SURGERY    ? more than 5 surgeries on right foot related to polio  ? surgery for polio    ? states x 5   ? TOTAL ABDOMINAL HYSTERECTOMY  2002  ? TUBAL PREGNANCY  ? TUBAL LIGATION    ? ? ?There were no vitals filed for this visit. ? ? Subjective Assessment - 03/13/21 1116   ? ? Subjective Patient reports that her arm is about the same since her last appointment. She notes that it is still waking her up in the middle of the night.   ? Pertinent History DM, GERD, HTN, DDD, back and foot surgery, polio   ? Limitations House hold activities;Lifting   ? Patient Stated Goals reduced pain, yardwork, and cleaning her house   ? Currently in Pain? Yes   ?  Pain Score 4    ? Pain Location Shoulder   ? Pain Orientation Right   ? Pain Onset More than a month ago   ? ?  ?  ? ?  ? ? ? ? ? ? ? ? ? ? ? ? ? ? ? ? ? ? ? ? [facility] Adult PT Treatment/Exercise - 03/13/21 0001   ? ?  ? Elbow Exercises  ? Elbow Flexion Right;Seated;Other reps (comment)   30 reps  ?  ? Shoulder Exercises: Pulleys  ? Flexion 5 minutes   ?  ? Modalities  ? Modalities Electrical Stimulation;Vasopneumatic   ?  ? Electrical Stimulation  ? Electrical Stimulation Location Right Bicep   ? Electrical Stimulation Action pre mod   ? Electrical Stimulation Parameters 80-150 x 15 minutes   ? Electrical Stimulation Goals Pain;Tone   ?  ? Vasopneumatic  ? Number Minutes Vasopneumatic  15 minutes   ? Vasopnuematic Location  Shoulder   ? Vasopneumatic Pressure Low   ? Vasopneumatic Temperature  34   ?  ? Manual Therapy  ? Manual Therapy Soft tissue mobilization   ? Soft tissue mobilization  to right bicep   ? ?  ?  ? ?  ? ? ? ? ? ? ? ? ? ? ? ? ? ? ? PT Long Term Goals - 02/22/21 1233   ? ?  ? PT LONG TERM GOAL #1  ? Title Patient will be independent with her HEP.   ? Time 4   ? Period Weeks   ? Status New   ? Target Date 03/22/21   ?  ? PT LONG TERM GOAL #2  ? Title Patient will be able to complete her daily activities without her right shoulder pain exceeding 7/10.   ? Time 4   ? Period Weeks   ? Status New   ? Target Date 03/22/21   ?  ? PT LONG TERM GOAL #3  ? Title Patient will be able to sleep without being awakened by her familiar right shoulder pain.   ? Time 4   ? Period Weeks   ? Status New   ? Target Date 03/22/21   ? ?  ?  ? ?  ? ? ? ? ? ? ? ? Plan - 03/13/21 1130   ? ? Clinical Impression Statement Treatment was initiated with manual therapy which focused on soft tissue mobilization to the right bicep. This was able to slightly reduce her familiar symptoms. This was then followed by appropriately matched interventions for reduced pain and improved mobility. She experienced the most significant relief with  electrical stimulation combined with the vasopneumatic device. She reported feeling good as her arm was not hurting upon the conclusion of treatment. She continues to require skilled physical therapy to address her remaining impairments to maximize her functional mobiltiy.   ? Personal Factors and Comorbidities Comorbidity 3+;Time since onset of injury/illness/exacerbation;Finances   ? Comorbidities DM, GERD, HTN, DDD, back and foot surgery, and history of polio   ? Examination-Activity Limitations Sleep;Lift   ? Examination-Participation Restrictions Cleaning;Valla Leaver Work   ? Stability/Clinical Decision Making Evolving/Moderate complexity   ? Rehab Potential Fair   ? PT Frequency 2x / week   1-2x/ week  ? PT Duration 4 weeks   ? PT Treatment/Interventions ADLs/Self Care Home Management;Cryotherapy;Electrical Stimulation;Moist Heat;Therapeutic activities;Therapeutic exercise;Neuromuscular re-education;Manual techniques;Patient/family education;Passive range of motion;Vasopneumatic Device;Taping   ? PT Next Visit Plan provided HEP to include isometrics, AAROM, and light strengthening, modalities as needed   ? Consulted and Agree with Plan of Care Patient   ? ?  ?  ? ?  ? ? ?Patient will benefit from skilled therapeutic intervention in order to improve the following deficits and impairments:  Decreased range of motion, Impaired UE functional use, Decreased activity tolerance, Pain, Impaired sensation, Decreased strength ? ?Visit Diagnosis: ?Chronic right shoulder pain ? ?Muscle weakness (generalized) ? ?Stiffness of right shoulder, not elsewhere classified ? ? ? ? ?Problem List ?Patient Active Problem List  ? Diagnosis Date Noted  ? Aortic atherosclerosis ([place]) 11/01/2020  ? Pain of right upper extremity 06/28/2020  ? Insomnia 12/18/2019  ? Coronary artery disease involving native coronary artery of native heart without angina pectoris 02/03/2019  ? Mixed hyperlipidemia 10/10/2017  ? Uncontrolled type 2 diabetes mellitus  with hyperglycemia, with long-term current use of insulin ([place]) 03/14/2017  ? Essential hypertension, benign 03/14/2017  ? GERD (gastroesophageal reflux disease) 12/29/2012  ? History of gout 12/29/2012  ? 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  ? Morbid obesity ([place]) 12/29/2012  ? Degenerative spondylolisthesis 07/08/2012  ? Spinal stenosis, lumbar region, with neurogenic claudication 07/08/2012  ? ? ?  Darlin Coco, PT ?03/13/2021, 12:39 PM ? ?[place] ?[facility] ?[address] ?[place], [place], [address] ?Phone: [phone]   Fax:  [phone] ? ?Name: Tanajah Burkhead ?MRN: [id number] ?Date of Birth: [birth year] ? ? ? ?

## 2021-03-15 NOTE — Progress Notes (Signed)
Received notification from [place] regarding approval for [facility] & ONGLYZA. Patient assistance approved from 01/17/21 to 12/31/21. ? ?ONGLYZA SHIPPED 01/19/21 ?FARXIGA SHIPPED 01/25/21 ? ?Phone: [phone] ? ?[facility] HANDLES REFILLS AND SHIPMENTS ? ?

## 2021-03-16 ENCOUNTER — Telehealth: Payer: Self-pay | Admitting: Family Medicine

## 2021-03-16 ENCOUNTER — Ambulatory Visit: Payer: Medicare Other

## 2021-03-16 ENCOUNTER — Other Ambulatory Visit: Payer: Self-pay

## 2021-03-16 DIAGNOSIS — M25511 Pain in right shoulder: Secondary | ICD-10-CM | POA: Diagnosis not present

## 2021-03-16 DIAGNOSIS — M6281 Muscle weakness (generalized): Secondary | ICD-10-CM | POA: Diagnosis not present

## 2021-03-16 DIAGNOSIS — M25611 Stiffness of right shoulder, not elsewhere classified: Secondary | ICD-10-CM | POA: Diagnosis not present

## 2021-03-16 DIAGNOSIS — G8929 Other chronic pain: Secondary | ICD-10-CM | POA: Diagnosis not present

## 2021-03-16 NOTE — Therapy (Addendum)
[place] ?[facility] ?[address] ?[place], [place], [address] ?Phone: [phone]   Fax:  [phone] ? ?Physical Therapy Treatment ? ?Patient Details  ?Name: Sara Stuart ?MRN: [id number] ?Date of Birth: 1955/12/16 ?Referring Provider (PT): Blanch Media, FNP ? ? ?Encounter Date: 03/16/2021 ? ? PT End of Session - 03/16/21 1107   ? ? Visit Number 6   ? Number of Visits 6   ? Date for PT Re-Evaluation 04/07/21   ? PT Start Time 1115   ? PT Stop Time 1202   ? PT Time Calculation (min) 47 min   ? Activity Tolerance Patient tolerated treatment well   ? Behavior During Therapy [facility] for tasks assessed/performed   ? ?  ?  ? ?  ? ? ?Past Medical History:  ?Diagnosis Date  ? Arthritis   ? Chronic back pain   ? spondylolishthesis  ? DDD (degenerative disc disease), lumbosacral   ? Diabetes mellitus without complication ([place]) diagnosed in 02/2014  ? takes Metformin and Glipizide daily  ? GERD (gastroesophageal reflux disease)   ? takes Omeprazole daily  ? Gout   ? takes Colchicine daily as needed  ? Hypertension   ? takes Dyazide and Diltiazem daily  ? Nocturia   ? Polio   ? PONV (postoperative nausea and vomiting)   ? Wears glasses   ? ? ?Past Surgical History:  ?Procedure Laterality Date  ? BACK SURGERY  07/2012  ? bladder tacked     ? COLONOSCOPY    ? FOOT SURGERY    ? more than 5 surgeries on right foot related to polio  ? surgery for polio    ? states x 5   ? TOTAL ABDOMINAL HYSTERECTOMY  2002  ? TUBAL PREGNANCY  ? TUBAL LIGATION    ? ? ?There were no vitals filed for this visit. ? ? Subjective Assessment - 03/16/21 1107   ? ? Subjective Pt arrives for today's treatment session reporting 2/10 right shoulder pain.  Pt reports that her arm woke her up at 4 am with 10/10 pain, but it has decreased since.   ? Pertinent History DM, GERD, HTN, DDD, back and foot surgery, polio   ? Limitations House hold activities;Lifting   ? Patient Stated Goals reduced pain, yardwork, and cleaning her house   ?  Currently in Pain? Yes   ? Pain Score 2    ? Pain Location Shoulder   ? Pain Orientation Right   ? Pain Onset More than a month ago   ? ?  ?  ? ?  ? ? ? ? ? ? ? ? ? ? ? ? ? ? ? ? ? ? ? ? [facility] Adult PT Treatment/Exercise - 03/16/21 0001   ? ?  ? Shoulder Exercises: Pulleys  ? Flexion 5 minutes   ?  ? Shoulder Exercises: ROM/Strengthening  ? Ranger Flexion/extension, CW/CCW circles x 3 mins each   ?  ? Modalities  ? Modalities Electrical Stimulation;Vasopneumatic   ?  ? Electrical Stimulation  ? Electrical Stimulation Location Right deltoid   ? Electrical Stimulation Action IFC   ? Electrical Stimulation Parameters 80-150 Hz x 15 mins   ? Electrical Stimulation Goals Pain;Tone   ?  ? Vasopneumatic  ? Number Minutes Vasopneumatic  15 minutes   ? Vasopnuematic Location  Shoulder   ? Vasopneumatic Pressure Low   ? Vasopneumatic Temperature  34   ?  ? Manual Therapy  ? Manual Therapy Soft tissue mobilization   ?  Soft tissue mobilization to right bicep and deltoid to decrease pain and tone, tender to palpation   ? ?  ?  ? ?  ? ? ? ? ? ? ? ? ? ? ? ? ? ? ? PT Long Term Goals - 03/16/21 1108   ? ?  ? PT LONG TERM GOAL #1  ? Title Patient will be independent with her HEP.   ? Time 4   ? Period Weeks   ? Status Achieved   ? Target Date 03/22/21   ?  ? PT LONG TERM GOAL #2  ? Title Patient will be able to complete her daily activities without her right shoulder pain exceeding 7/10.   ? Baseline 03/16/21: pain with sleeping, dressing, shopping > 7/10, but painful days have decreased   ? Time 4   ? Period Weeks   ? Status On-going   ? Target Date 03/22/21   ?  ? PT LONG TERM GOAL #3  ? Title Patient will be able to sleep without being awakened by her familiar right shoulder pain.   ? Baseline 03/16/21: shoulder continues to wake her up in the night   ? Time 4   ? Period Weeks   ? Status On-going   ? Target Date 03/22/21   ? ?  ?  ? ?  ? ? ? ? ? ? ? ? Plan - 03/16/21 1107   ? ? Clinical Impression Statement Pt arrives for today's  treamtent session reporting 2/10 right shoulder pain.  Pt states that she woke up at 4 am with 10/10 right shoulder pain and was unable to go back to sleep after that due to pain. Pt has made little progress towards her goals at this time, only meeting her HEP goal. The number of days a week that pt experience pain > 7/10 has decreased slightly, but the pain is still affecting her every day life and ability to perform ADLs. STW/M performed to right deltoid and bicep to decrease pain and tone with tenderness to palapation.  Normal responses to estim and vaso noted upon removal  Pt encourage to contact MD as soon as possible to schedule imaging. Pt reported 3/10 right shoulder pain at completion of today's treatment.   ? Personal Factors and Comorbidities Comorbidity 3+;Time since onset of injury/illness/exacerbation;Finances   ? Comorbidities DM, GERD, HTN, DDD, back and foot surgery, and history of polio   ? Examination-Activity Limitations Sleep;Lift   ? Examination-Participation Restrictions Cleaning;Valla Leaver Work   ? Stability/Clinical Decision Making Evolving/Moderate complexity   ? Rehab Potential Fair   ? PT Frequency 2x / week   1-2x/ week  ? PT Duration 4 weeks   ? PT Treatment/Interventions ADLs/Self Care Home Management;Cryotherapy;Electrical Stimulation;Moist Heat;Therapeutic activities;Therapeutic exercise;Neuromuscular re-education;Manual techniques;Patient/family education;Passive range of motion;Vasopneumatic Device;Taping   ? PT Next Visit Plan provided HEP to include isometrics, AAROM, and light strengthening, modalities as needed   ? Consulted and Agree with Plan of Care Patient   ? ?  ?  ? ?  ? ? ?Patient will benefit from skilled therapeutic intervention in order to improve the following deficits and impairments:  Decreased range of motion, Impaired UE functional use, Decreased activity tolerance, Pain, Impaired sensation, Decreased strength ? ?Visit Diagnosis: ?Chronic right shoulder pain ? ?Muscle  weakness (generalized) ? ?Stiffness of right shoulder, not elsewhere classified ? ? ? ? ?Problem List ?Patient Active Problem List  ? Diagnosis Date Noted  ? Aortic atherosclerosis ([place]) 11/01/2020  ? Pain of right  upper extremity 06/28/2020  ? Insomnia 12/18/2019  ? Coronary artery disease involving native coronary artery of native heart without angina pectoris 02/03/2019  ? Mixed hyperlipidemia 10/10/2017  ? Uncontrolled type 2 diabetes mellitus with hyperglycemia, with long-term current use of insulin ([place]) 03/14/2017  ? Essential hypertension, benign 03/14/2017  ? GERD (gastroesophageal reflux disease) 12/29/2012  ? History of gout 12/29/2012  ? 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  ? Morbid obesity ([place]) 12/29/2012  ? Degenerative spondylolisthesis 07/08/2012  ? Spinal stenosis, lumbar region, with neurogenic claudication 07/08/2012  ? ? ?Kathrynn Ducking, PTA ?03/16/2021, 12:06 PM ? ?[place] ?[facility] ?[address] ?[place], [place], [address] ?Phone: [phone]   Fax:  [phone] ? ?Name: Sara Stuart ?MRN: [id number] ?Date of Birth: 12-29-1955 ? ?PHYSICAL THERAPY DISCHARGE SUMMARY ? ?Visits from Start of Care: 6 ? ?Current functional level related to goals / functional outcomes: ?Patient was unable to make significant progress with skilled physical therapy as she was unable to meet her goals for therapy. Recommend that she follow up with her referring physician for additional medical testing.  ?  ?Remaining deficits: ?Right shoulder pain ?  ?Education / Equipment: ?HEP   ? ?Patient agrees to discharge. Patient goals were partially met. Patient is being discharged due to lack of progress. ? ?Jacqulynn Cadet, PT, DPT   ? ?

## 2021-03-16 NOTE — Telephone Encounter (Signed)
Lmtcb.

## 2021-03-16 NOTE — Telephone Encounter (Signed)
Please have patient schedule a follow-up appointment for her arm. ?

## 2021-03-20 NOTE — Telephone Encounter (Signed)
Attempted to contact- NA ?No call back  ?When patient calls back schedule an appointment  ?This encounter will be closed  ?

## 2021-03-21 ENCOUNTER — Encounter: Payer: Self-pay | Admitting: Family Medicine

## 2021-03-21 ENCOUNTER — Ambulatory Visit (INDEPENDENT_AMBULATORY_CARE_PROVIDER_SITE_OTHER): Payer: Medicare Other | Admitting: Family Medicine

## 2021-03-21 VITALS — BP 110/71 | HR 57 | Temp 96.7°F | Ht 61.0 in | Wt 210.8 lb

## 2021-03-21 DIAGNOSIS — M79621 Pain in right upper arm: Secondary | ICD-10-CM | POA: Diagnosis not present

## 2021-03-21 NOTE — Progress Notes (Signed)
? ?Assessment & Plan:  ?1. Pain of right upper arm ?Failed physical therapy and conservative management. Negative x-ray. MRI ordered. ?- MR HUMERUS RIGHT W WO CONTRAST; Future ? ? ?Follow up plan: Return in about 7 weeks (around 05/09/2021) for follow-up of chronic medication conditions. ? ?[name], MSN, APRN, FNP-C ?[facility] ? ?Subjective:  ? ?Patient ID: Sara Stuart, female    DOB: 07/18/1955, 66 y.o.   MRN: [id number] ? ?HPI: ?Sara Stuart is a 66 y.o. female presenting on 03/21/2021 for Arm Pain (Right arm pain that has been ongoing. Would like MRI) ? ?Patient is following up on right arm pain. This has been going on for 9-10 months. Pain started when she was pulling groceries up some stairs. States she felt it pop like a rubberband. Describes pain as stabbing, radiating down her right arm and rates it 7/10 currently. She reports it gets very stiff and wakes her at night. She has been doing physical therapy, but is not making progress. She has been using a TENS unit.  She previously tried baclofen, ibuprofen, Tylenol, and Biofreeze.  She has had a negative humerus x-ray.  ? ? ?ROS: Negative unless specifically indicated above in HPI.  ? ?Relevant past medical history reviewed and updated as indicated.  ? ?Allergies and medications reviewed and updated. ? ? ?Current Outpatient Medications:  ?  aspirin EC 81 MG tablet, Take 1 tablet (81 mg total) by mouth daily., Disp: 30 tablet, Rfl:  ?  baclofen (LIORESAL) 10 MG tablet, Take 1 tablet (10 mg total) by mouth 3 (three) times daily as needed., Disp: 60 each, Rfl: 2 ?  dapagliflozin propanediol (FARXIGA) 10 MG TABS tablet, Take 1 tablet (10 mg total) by mouth daily before breakfast., Disp: 90 tablet, Rfl: 4 ?  diltiazem (DILT-XR) 120 MG 24 hr capsule, Take 1 capsule (120 mg total) by mouth daily., Disp: 90 capsule, Rfl: 1 ?  fluticasone (FLONASE) 50 MCG/ACT nasal spray, Place 2 sprays into both nostrils daily as needed., Disp: , Rfl:  ?   glucose blood test strip, Test BS four times daily Dx E11.65, Disp: 400 each, Rfl: 3 ?  ibuprofen (ADVIL) 800 MG tablet, TAKE 1 TABLET BY MOUTH EVERY 8 HOURS AS NEEDED FOR MODERATE PAIN, Disp: 90 tablet, Rfl: 0 ?  insulin isophane & regular human KwikPen (HUMULIN 70/30 KWIKPEN) (70-30) 100 UNIT/ML KwikPen, Inject 35 Units into the skin with breakfast, with lunch, and with evening meal., Disp: 30 mL, Rfl: 11 ?  Insulin Pen Needle (B-D ULTRAFINE III SHORT PEN) 31G X 8 MM MISC, 1 each by Does not apply route as directed., Disp: 100 each, Rfl: 3 ?  lisinopril (ZESTRIL) 2.5 MG tablet, Take 1 tablet (2.5 mg total) by mouth daily., Disp: 90 tablet, Rfl: 1 ?  metFORMIN (GLUCOPHAGE-XR) 500 MG 24 hr tablet, Take 2 tablets (1,000 mg total) by mouth 2 (two) times daily., Disp: 360 tablet, Rfl: 1 ?  metoprolol tartrate (LOPRESSOR) 25 MG tablet, Take 1 tablet (25 mg total) by mouth daily., Disp: 90 tablet, Rfl: 1 ?  nitroGLYCERIN (NITROSTAT) 0.4 MG SL tablet, DISSOLVE ONE TABLET UNDER THE TONGUE EVERY 5 MINUTES AS NEEDED FOR CHEST PAIN.  DO NOT EXCEED A TOTAL OF 3 DOSES IN 15 MINUTES, Disp: 25 tablet, Rfl: 0 ?  omeprazole (PRILOSEC) 40 MG capsule, Take 1 capsule by mouth once daily, Disp: 90 capsule, Rfl: 0 ?  rosuvastatin (CRESTOR) 10 MG tablet, Take 1 tablet by mouth once daily, Disp: 90 tablet,  Rfl: 1 ?  saxagliptin HCl (ONGLYZA) 5 MG TABS tablet, Take 1 tablet (5 mg total) by mouth daily., Disp: 90 tablet, Rfl: 4 ?  spironolactone (ALDACTONE) 25 MG tablet, Take 1/2 (one-half) tablet by mouth once daily, Disp: 45 tablet, Rfl: 0 ?  traZODone (DESYREL) 50 MG tablet, TAKE 1 TABLET BY MOUTH AT BEDTIME, Disp: 90 tablet, Rfl: 1 ?  triamterene-hydrochlorothiazide (DYAZIDE) 37.5-25 MG capsule, Take 1 each (1 capsule total) by mouth daily., Disp: 90 capsule, Rfl: 1 ?  insulin degludec (TRESIBA FLEXTOUCH) 100 UNIT/ML FlexTouch Pen, Inject 26 Units into the skin daily. (Patient not taking: Reported on 03/21/2021), Disp: 15 mL, Rfl:  3 ? ?Allergies  ?Allergen Reactions  ? Trulicity [Dulaglutide]   ?  GI intolerance  ? ? ?Objective:  ? ?BP 110/71   Pulse (!) 57   Temp (!) 96.7 ?F (35.9 ?C) (Temporal)   Ht 5\' 1"  (1.549 m)   Wt 210 lb 12.8 oz (95.6 kg)   SpO2 99%   BMI 39.83 kg/m?   ? ?Physical Exam ?Vitals reviewed.  ?Constitutional:   ?   General: She is not in acute distress. ?   Appearance: Normal appearance. She is not ill-appearing, toxic-appearing or diaphoretic.  ?HENT:  ?   Head: Normocephalic and atraumatic.  ?Eyes:  ?   General: No scleral icterus.    ?   Right eye: No discharge.     ?   Left eye: No discharge.  ?   Conjunctiva/sclera: Conjunctivae normal.  ?Cardiovascular:  ?   Rate and Rhythm: Normal rate.  ?Pulmonary:  ?   Effort: Pulmonary effort is normal. No respiratory distress.  ?Musculoskeletal:     ?   General: Normal range of motion.  ?   Right upper arm: Tenderness present. No swelling, edema, lacerations or bony tenderness.  ?   Cervical back: Normal range of motion.  ?Skin: ?   General: Skin is warm and dry.  ?   Capillary Refill: Capillary refill takes less than 2 seconds.  ?Neurological:  ?   General: No focal deficit present.  ?   Mental Status: She is alert and oriented to person, place, and time. Mental status is at baseline.  ?Psychiatric:     ?   Mood and Affect: Mood normal.     ?   Behavior: Behavior normal.     ?   Thought Content: Thought content normal.     ?   Judgment: Judgment normal.  ? ? ? ? ? ? ?

## 2021-04-06 ENCOUNTER — Ambulatory Visit (HOSPITAL_COMMUNITY)
Admission: RE | Admit: 2021-04-06 | Discharge: 2021-04-06 | Disposition: A | Discharge status: Home / Self Care | Payer: Medicare Other | Source: Ambulatory Visit | Attending: Family Medicine | Admitting: Family Medicine

## 2021-04-06 ENCOUNTER — Other Ambulatory Visit: Payer: Self-pay | Admitting: Family Medicine

## 2021-04-06 DIAGNOSIS — M19011 Primary osteoarthritis, right shoulder: Secondary | ICD-10-CM | POA: Diagnosis not present

## 2021-04-06 DIAGNOSIS — M79621 Pain in right upper arm: Secondary | ICD-10-CM | POA: Diagnosis not present

## 2021-04-06 MED ORDER — GADOBUTROL 1 MMOL/ML IV SOLN: 10.0000 mL | Freq: Once | INTRAVENOUS | Status: DC | PRN

## 2021-04-10 ENCOUNTER — Telehealth: Payer: Self-pay | Admitting: Family Medicine

## 2021-04-10 DIAGNOSIS — M75101 Unspecified rotator cuff tear or rupture of right shoulder, not specified as traumatic: Secondary | ICD-10-CM

## 2021-04-10 DIAGNOSIS — M79621 Pain in right upper arm: Secondary | ICD-10-CM

## 2021-04-10 NOTE — Telephone Encounter (Signed)
REFERRAL REQUEST ?Telephone Note ? ?Have you been seen at our office for this problem? yes ?(Advise that they may need an appointment with their PCP before a referral can be done) ? ?Reason for Referral: go see Neuro Doctor ?Referral discussed with patient: yes  ?Best contact number of patient for referral team: [phone]    ?Has patient been seen by a specialist for this issue before: no  ?Patient provider preference for referral: none ?Patient location preference for referral: none ?  ?Patient notified that referrals can take up to a week or longer to process. If they haven't heard anything within a week they should call back and speak with the referral department.   ? ?[facility] pt. ? ?She has decided to got to Neuro Doctor now. ? ?Please call pt. ?

## 2021-04-11 NOTE — Telephone Encounter (Signed)
What will patient be seeing neuro for?  ?

## 2021-04-11 NOTE — Telephone Encounter (Signed)
Spoke with pt. She states that she needs a referral for a torn ligament in her shoulder. She has been dealing with it for 10 months. She was unsure of where she needed to be referred to. Pt is ok to wait until Britney's return on Thursday. ?

## 2021-04-13 NOTE — Telephone Encounter (Signed)
Patient informed of referral placed.

## 2021-04-13 NOTE — Addendum Note (Signed)
Addended byDene Gentry on: 04/13/2021 04:26 PM ? ? Modules accepted: Orders ? ?

## 2021-04-13 NOTE — Addendum Note (Signed)
Addended bySigurd Sos on: 04/13/2021 04:15 PM ? ? Modules accepted: Orders ? ?

## 2021-04-13 NOTE — Telephone Encounter (Signed)
Okay to refer to [facility]. ?

## 2021-04-15 ENCOUNTER — Other Ambulatory Visit: Payer: Self-pay | Admitting: Family Medicine

## 2021-04-18 ENCOUNTER — Other Ambulatory Visit: Payer: Self-pay | Admitting: Family Medicine

## 2021-04-24 ENCOUNTER — Ambulatory Visit: Payer: Medicare Other | Admitting: Orthopedic Surgery

## 2021-04-25 ENCOUNTER — Other Ambulatory Visit: Payer: Self-pay | Admitting: Internal Medicine

## 2021-05-02 ENCOUNTER — Telehealth: Payer: Self-pay | Admitting: Family Medicine

## 2021-05-02 NOTE — Telephone Encounter (Signed)
Looks like patient has patient assistance- please advise if samples can be given.  ?

## 2021-05-02 NOTE — Telephone Encounter (Signed)
Please give 1 box of tresiba ?She doesn't get patient assistance for tresiba (just other insulins) ?I will call her on Friday and work to get the tresiba too (we were hoping she wouldn't still need it, but apparently blood sugars are still requiring it) ? ?Thanks! ?

## 2021-05-02 NOTE — Telephone Encounter (Signed)
Pt called requesting to speak with Almyra Free. ? ?Says she is out of [place] and needs Almyra Free to give her more samples.  ?

## 2021-05-02 NOTE — Telephone Encounter (Signed)
NA ? ?Please let patient know there is a sample ready ?

## 2021-05-04 NOTE — Telephone Encounter (Signed)
Left detailed message. ?This encounter will be closed.  ?

## 2021-05-05 ENCOUNTER — Ambulatory Visit (INDEPENDENT_AMBULATORY_CARE_PROVIDER_SITE_OTHER): Payer: Medicare Other | Admitting: Pharmacist

## 2021-05-05 DIAGNOSIS — E1165 Type 2 diabetes mellitus with hyperglycemia: Secondary | ICD-10-CM

## 2021-05-05 DIAGNOSIS — E782 Mixed hyperlipidemia: Secondary | ICD-10-CM

## 2021-05-08 ENCOUNTER — Ambulatory Visit: Payer: Medicare Other | Admitting: Orthopedic Surgery

## 2021-05-08 ENCOUNTER — Encounter: Payer: Self-pay | Admitting: Orthopedic Surgery

## 2021-05-08 DIAGNOSIS — M25511 Pain in right shoulder: Secondary | ICD-10-CM | POA: Diagnosis not present

## 2021-05-08 DIAGNOSIS — G8929 Other chronic pain: Secondary | ICD-10-CM | POA: Diagnosis not present

## 2021-05-08 MED ORDER — DIAZEPAM 5 MG PO TABS: 5.0000 mg | ORAL_TABLET | Freq: Once | ORAL | 0 refills | Status: AC

## 2021-05-08 NOTE — Progress Notes (Signed)
New Patient Visit ? ?Assessment: ?Sara Stuart is a 66 y.o. female with the following: ?1. Chronic right shoulder pain ? ?Plan: ?Berenis Corter has pain in her right shoulder.  This has been ongoing for the past 10 months.  At the onset of her pain, she notes a tearing sensation, similar to a rubber band.  She has tried medications, TENS unit and she has been working with physical therapy.  She has had no relief of her symptoms.  She previously had attempted an MRI of the right humerus.  This was nondiagnostic, as she did not tolerate it.  In addition, this did not fully evaluate the shoulder and the rotator cuff tendons.  Despite the limited results from the MRI, there is some evidence of an injury to the anterior aspect of the rotator cuff.  As a result, I would recommend proceeding with a right shoulder MRI.  We will try and get this scheduled at an open MRI and I have provided her with Valium to be taken prior to the imaging study.  Pending the results of the MRI, she may benefit from rotator cuff repair, and she states that she is interested if this could be beneficial. ? ?Follow-up: ?Return for After MRI. ? ?Subjective: ? ?Chief Complaint  ?Patient presents with  ? Shoulder Pain  ?  Right shld pain, MRI done.    ? ? ?History of Present Illness: ?Sara Stuart is a 66 y.o. female who has been referred by [name], FNP for evaluation of right shoulder pain.  She has had pain in the right shoulder for the past 10 months.  She reports that she was dragging some groceries into her house at that time, which she noted a popping sensation.  She said it was like a rubber band snapping.  Since then, she has had a lot of pain in her right shoulder.  The pain is in the anterior aspect of the shoulder.  She has difficulty with overhead motion.  She has been taking prescription medications, using a TENS unit and has worked with physical therapy.  She has had no improvement in her symptoms.  She did have an MRI of the right  humerus scheduled, but this was inconclusive as she was unable to tolerate the entire exam.  If surgery could help, she is interested in proceeding with surgery. ? ? ?Review of Systems: ?No fevers or chills ?No numbness or tingling ?No chest pain ?No shortness of breath ?No bowel or bladder dysfunction ?No GI distress ?No headaches ? ? ?Medical History: ? ?Past Medical History:  ?Diagnosis Date  ? Arthritis   ? Chronic back pain   ? spondylolishthesis  ? DDD (degenerative disc disease), lumbosacral   ? Diabetes mellitus without complication (HCC) diagnosed in 02/2014  ? takes Metformin and Glipizide daily  ? GERD (gastroesophageal reflux disease)   ? takes Omeprazole daily  ? Gout   ? takes Colchicine daily as needed  ? Hypertension   ? takes Dyazide and Diltiazem daily  ? Nocturia   ? Polio   ? PONV (postoperative nausea and vomiting)   ? Wears glasses   ? ? ?Past Surgical History:  ?Procedure Laterality Date  ? BACK SURGERY  07/2012  ? bladder tacked     ? COLONOSCOPY    ? FOOT SURGERY    ? more than 5 surgeries on right foot related to polio  ? surgery for polio    ? states x 5   ? TOTAL ABDOMINAL  HYSTERECTOMY  2002  ? TUBAL PREGNANCY  ? TUBAL LIGATION    ? ? ?Family History  ?Problem Relation Age of Onset  ? Colon cancer Mother   ? Diabetes Father   ? Thyroid disease Father   ? Heart attack Father   ? ?Social History  ? ?Tobacco Use  ? Smoking status: Never  ? Smokeless tobacco: Never  ?Vaping Use  ? Vaping Use: Never used  ?Substance Use Topics  ? Alcohol use: No  ? Drug use: No  ? ? ?Allergies  ?Allergen Reactions  ? Trulicity [Dulaglutide]   ?  GI intolerance  ? ? ?Current Meds  ?Medication Sig  ? aspirin EC 81 MG tablet Take 1 tablet (81 mg total) by mouth daily.  ? baclofen (LIORESAL) 10 MG tablet Take 1 tablet (10 mg total) by mouth 3 (three) times daily as needed.  ? dapagliflozin propanediol (FARXIGA) 10 MG TABS tablet Take 1 tablet (10 mg total) by mouth daily before breakfast.  ? diazepam (VALIUM) 5 MG  tablet Take 1 tablet (5 mg total) by mouth once for 1 dose. Take within 60 minutes of the procedure/MRI  ? diltiazem (DILT-XR) 120 MG 24 hr capsule Take 1 capsule (120 mg total) by mouth daily.  ? fluticasone (FLONASE) 50 MCG/ACT nasal spray Place 2 sprays into both nostrils daily as needed.  ? glucose blood test strip Test BS four times daily Dx E11.65  ? ibuprofen (ADVIL) 800 MG tablet TAKE 1 TABLET BY MOUTH EVERY 8 HOURS AS NEEDED FOR MODERATE PAIN  ? insulin degludec (TRESIBA FLEXTOUCH) 100 UNIT/ML FlexTouch Pen Inject 26 Units into the skin daily.  ? insulin isophane & regular human KwikPen (HUMULIN 70/30 KWIKPEN) (70-30) 100 UNIT/ML KwikPen Inject 35 Units into the skin with breakfast, with lunch, and with evening meal.  ? Insulin Pen Needle (B-D ULTRAFINE III SHORT PEN) 31G X 8 MM MISC 1 each by Does not apply route as directed.  ? lisinopril (ZESTRIL) 2.5 MG tablet Take 1 tablet (2.5 mg total) by mouth daily.  ? metFORMIN (GLUCOPHAGE-XR) 500 MG 24 hr tablet Take 2 tablets (1,000 mg total) by mouth 2 (two) times daily.  ? metoprolol tartrate (LOPRESSOR) 25 MG tablet Take 1 tablet (25 mg total) by mouth daily.  ? nitroGLYCERIN (NITROSTAT) 0.4 MG SL tablet DISSOLVE ONE TABLET UNDER THE TONGUE EVERY 5 MINUTES AS NEEDED FOR CHEST PAIN.  DO NOT EXCEED A TOTAL OF 3 DOSES IN 15 MINUTES  ? omeprazole (PRILOSEC) 40 MG capsule Take 1 capsule by mouth once daily  ? rosuvastatin (CRESTOR) 10 MG tablet Take 1 tablet by mouth once daily  ? saxagliptin HCl (ONGLYZA) 5 MG TABS tablet Take 1 tablet (5 mg total) by mouth daily.  ? spironolactone (ALDACTONE) 25 MG tablet Take 1/2 (one-half) tablet by mouth once daily  ? traZODone (DESYREL) 50 MG tablet TAKE 1 TABLET BY MOUTH AT BEDTIME  ? triamterene-hydrochlorothiazide (DYAZIDE) 37.5-25 MG capsule Take 1 each (1 capsule total) by mouth daily.  ? ? ?Objective: ?There were no vitals taken for this visit. ? ?Physical Exam: ? ?General: Alert and oriented. and No acute  distress. ?Gait: Right sided antalgic gait.  Uses a cane to assist with ambulation. ? ?Right shoulder without deformity.  Difficulty with overhead motion.  Positive drop arm test.  Pain in the empty can testing position.  Positive O'Brien's.  Tenderness to palpation over the anterior shoulder.  4+/5 supraspinatus and infraspinatus strength testing.  Internal rotation to her lumbar spine.  Negative belly press. ? ? ? ?IMAGING: ?I personally reviewed images previously obtained in clinic ? ?Prior x-ray of the right shoulder demonstrates mild degenerative changes within the glenohumeral joint, as well as the [facility]. ? ? ?MRI of the right humerus was nondiagnostic, as it was not completed.  Limited views suggest possible tearing of anterior rotator cuff tendons. ? ? ?New Medications:  ?Meds ordered this encounter  ?Medications  ? diazepam (VALIUM) 5 MG tablet  ?  Sig: Take 1 tablet (5 mg total) by mouth once for 1 dose. Take within 60 minutes of the procedure/MRI  ?  Dispense:  1 tablet  ?  Refill:  0  ? ? ? ? ?Oliver BarreMark A Winefred Hillesheim, MD ? ?05/08/2021 ?11:30 PM ? ?  ?

## 2021-05-09 MED ORDER — FREESTYLE LIBRE 2 SENSOR MISC: 11 refills | Status: DC

## 2021-05-09 MED ORDER — FREESTYLE LIBRE 2 READER DEVI: 0 refills | Status: DC

## 2021-05-09 NOTE — Progress Notes (Signed)
? ?Chronic Care Management ?Pharmacy Note ? ?05/05/2021 ?Name:  Sara Stuart MRN:  [id number] DOB:  Sep 23, 1955 ? ?Summary: ? ?Diabetes: Goal on Track (progressing): YES. ?Uncontrolled-A1C 7.8%, glucose has improved a bit; backing down on insulin due to recent overlap of mix insulin; current treatment: Tresiba 25 units, Humulin 70/30 mix pen (3o units with each meal), Onglyza 39m, Farxiga 152m metformin; ?Patient would like to go on Humulin 70/30 mix pen since her husband is on that regimen, however will readdress; not ideal (concern for hypoglycemia/overlap with mix+basal+other meds) ?Noted intolerance to Trulicity (may consider Rybelsus, low dose ozempic, mounjaro to decrease insulin requirements) ?Denies personal and family history of Medullary thyroid cancer (MTC) ?Continue tresiba 26 units  ?Continue mix insulin 70/30 to 20-30 units --patient doing 3 times daily ?Would like to have patient on rapid acting insulin only (not mix) ?Concerned about hypo events ?Recheck 05/10/21 ?Working to get liEnergy [employer] CorporationGM ?Continue ongylza (free via az&me patient assistance program) ?Continue farxiga (free via az&me patient assistance program) ?Continue metformin ?Current glucose readings: fasting glucose: >200s, post prandial glucose: <200 ?Reports hypoglycemia ?The patient is asked to make an attempt to improve diet and exercise patterns to aid in medical management of this problem. ?Discussed meal planning options and Plate method for healthy eating ?Avoid sugary drinks and desserts ?Incorporate balanced protein, non starchy veggies, 1 serving of carbohydrate with each meal ?Increase water intake ?Increase physical activity as able ?Current exercise: n/a-->encouraged ?Recommended heart healthy diet, healthy plate method handout given; work on transition DM regimen ?Assessed patient finances. Patient approved for Az[employer]me patient assistance for Farxiga and Onglyza (sub for januvia-easier to get)--patient approved until 12/31/21,  insulin ?Hyperlipidemia:  Goal on Track (progressing): YES. ?Uncontrolled-LDL 78 current treatment: rosuvastatin 1060maily;  ?Medications previously tried: n/a  ?Current dietary patterns: heart healthy diet, healthy plate method handout given ?Counseled on diet, may increase statin if not at goal with A1c control and lifestyle changes ? ?Hypertension: ?Patient at goal <130/80 ?BP home report-ranges from 110s-130s/65-80 ?Continue current regimen--on ACEi ? ?Patient Goals/Self-Care Activities ?patient will:  ?- take medications as prescribed as evidenced by patient report and record review ?check glucose DAILY OR IF SYMPTOMATIC, document, and provide at future appointments ?collaborate with provider on medication access solutions ?target a minimum of 150 minutes of moderate intensity exercise weekly ?engage in dietary modifications by FOLLOWING HEALTHY PLATE METHOD/HEART HEALTHY DIET ? ? ?Recommendations/Changes made from today's visit: ? ?Plan: ? ?Subjective: ?GraMaybell Stuart an [age]o. year old female who is a primary patient of Sara BrooklynNP.  The CCM team was consulted for assistance with disease management and care coordination needs.   ? ?Engaged with patient by telephone for follow up visit in response to provider referral for pharmacy case management and/or care coordination services.  ? ?Consent to Services:  ?The patient was given information about Chronic Care Management services, agreed to services, and gave verbal consent prior to initiation of services.  Please see initial visit note for detailed documentation.  ? ?Patient Care Team: ?Sara BrooklynNP as PCP - General (Family Medicine) ?Sara Stuart Pharmacist (Family Medicine) ?Sara KhatD (Optometry) ? ?Objective: ? ?Lab Results  ?Component Value Date  ? CREATININE 0.87 02/09/2021  ? CREATININE 0.96 11/01/2020  ? CREATININE 0.89 04/29/2020  ? ? ?Lab Results  ?Component Value Date  ? HGBA1C 7.6 (H) 02/09/2021  ? ?Last  diabetic Eye exam:  ?Lab Results  ?Component Value Date/Time  ? HMDIABEYEEXA No Retinopathy  06/23/2020 12:00 AM  ?  ?Last diabetic Foot exam: No results found for: HMDIABFOOTEX  ? ?   ?Component Value Date/Time  ? CHOL 158 02/09/2021 1436  ? TRIG 130 02/09/2021 1436  ? HDL 63 02/09/2021 1436  ? CHOLHDL 2.5 02/09/2021 1436  ? CHOLHDL 3.8 05/02/2017 1617  ? [place] 72 02/09/2021 1436  ? LDLCALC 111 (H) 05/02/2017 1617  ? ? ? ?  Latest Ref Rng & Units 02/09/2021  ?  2:36 PM 11/01/2020  ? 10:22 AM 04/29/2020  ?  2:01 PM  ?Hepatic Function  ?Total Protein 6.0 - 8.5 g/dL 7.3   7.0   6.9    ?Albumin 3.8 - 4.8 g/dL 4.5   4.2   3.9    ?AST 0 - 40 IU/L _0 ?ALT 0 - 32 IU/L _1 ?Alk Phosphatase 44 - 121 IU/L 82   80   102    ?Total Bilirubin 0.0 - 1.2 mg/dL 0.2   0.3   0.3    ? ? ?Lab Results  ?Component Value Date/Time  ? TSH 1.41 05/02/2017 04:17 PM  ? FREET4 1.1 05/02/2017 04:17 PM  ? ? ? ?  Latest Ref Rng & Units 02/09/2021  ?  2:36 PM 11/01/2020  ? 10:22 AM 04/29/2020  ?  2:01 PM  ?CBC  ?WBC 3.4 - 10.8 x10E3/uL 11.7   9.0   10.3    ?Hemoglobin 11.1 - 15.9 g/dL 14.1   13.3   13.3    ?Hematocrit 34.0 - 46.6 % 43.2   42.3   41.8    ?Platelets 150 - 450 x10E3/uL 314   306   256    ? ? ?Lab Results  ?Component Value Date/Time  ? VD25OH 22 (L) 05/02/2017 04:17 PM  ? ? ?Clinical ASCVD: No  ?The 10-year ASCVD risk score (Arnett DK, et al., 2019) is: 8.8% ?  Values used to calculate the score: ?    Age: 77 years ?    Sex: Female ?    Is Non-Hispanic African American: No ?    Diabetic: Yes ?    Tobacco smoker: No ?    Systolic Blood Pressure: 017 mmHg ?    Is BP treated: Yes ?    HDL Cholesterol: 63 mg/dL ?    Total Cholesterol: 158 mg/dL   ? ?Other: (CHADS2VASc if Afib, PHQ9 if depression, MMRC or CAT for COPD, ACT, DEXA) ? ?Social History  ? ?Tobacco Use  ?Smoking Status Never  ?Smokeless Tobacco Never  ? ?BP Readings from Last 3 Encounters:  ?03/21/21 110/71  ?02/09/21 115/80  ?01/17/21 117/76  ? ?Pulse Readings  from Last 3 Encounters:  ?03/21/21 (!) 57  ?02/09/21 92  ?01/17/21 64  ? ?Wt Readings from Last 3 Encounters:  ?03/21/21 210 lb 12.8 oz (95.6 kg)  ?02/09/21 209 lb 6.4 oz (95 kg)  ?01/17/21 209 lb (94.8 kg)  ? ? ?Assessment: Review of patient past medical history, allergies, medications, health status, including review of consultants reports, laboratory and other test data, was performed as part of comprehensive evaluation and provision of chronic care management services.  ? ?SDOH:  (Social Determinants of Health) assessments and interventions performed:  ? ? ?CCM Care Plan ? ?Allergies  ?Allergen Reactions  ? Trulicity [Dulaglutide]   ?  GI intolerance  ? ? ?Medications Reviewed Today   ? ? Reviewed by Mordecai Rasmussen, MD (  Physician) on 05/08/21 at 2337  Med List Status: <None>  ? ?Medication Order Taking? Sig Documenting Provider Last Dose Status Informant  ?aspirin EC 81 MG tablet [id number] Yes Take 1 tablet (81 mg total) by mouth daily. Orson Eva, MD Taking Active Self  ?         ?Med Note [facility]   Thu Mar 14, 2017  2:26 PM)    ?baclofen (LIORESAL) 10 MG tablet [id number] Yes Take 1 tablet (10 mg total) by mouth 3 (three) times daily as needed. Sara Brooklyn, FNP Taking Active   ?dapagliflozin propanediol (FARXIGA) 10 MG TABS tablet [id number] Yes Take 1 tablet (10 mg total) by mouth daily before breakfast. Sara Brooklyn, FNP Taking Active   ?diazepam (VALIUM) 5 MG tablet [id number] Yes Take 1 tablet (5 mg total) by mouth once for 1 dose. Take within 60 minutes of the procedure/MRI Mordecai Rasmussen, MD  Active   ?diltiazem (DILT-XR) 120 MG 24 hr capsule [id number] Yes Take 1 capsule (120 mg total) by mouth daily. Sara Brooklyn, FNP Taking Active   ?fluticasone (FLONASE) 50 MCG/ACT nasal spray [id number] Yes Place 2 sprays into both nostrils daily as needed. [provider] Taking Active   ?glucose blood test strip [id number] Yes Test BS four times daily Dx E11.65 Janora Norlander, DO  Taking Active   ?ibuprofen (ADVIL) 800 MG tablet [id number] Yes TAKE 1 TABLET BY MOUTH EVERY 8 HOURS AS NEEDED FOR MODERATE PAIN Sara Brooklyn, FNP Taking Active   ?insulin degludec (TRESIBA FLEXTOUCH) 100 UNIT/ML

## 2021-05-09 NOTE — Patient Instructions (Signed)
Visit Information ? ?Following are the goals we discussed today:  ?Current Barriers:  ?Unable to independently afford treatment regimen ?Unable to achieve control of T2DM  ?Suboptimal therapeutic regimen for T2DM ? ?Pharmacist Clinical Goal(s):  ?patient will verbalize ability to afford treatment regimen ?achieve control of T2DM, HLD as evidenced by GOAL A1C, IMPROVED QUALITY OF LIFE, LDL<70 ?maintain control of T2DM, HLD as evidenced by GOAL A1C, IMPROVED QUALITY OF LIFE, LDL<70  ?adhere to plan to optimize therapeutic regimen for T2DM, HLD as evidenced by report of adherence to recommended medication management changes through collaboration with PharmD and provider.  ? ?Interventions: ?1:1 collaboration with Loman Brooklyn, FNP regarding development and update of comprehensive plan of care as evidenced by provider attestation and co-signature ?Inter-disciplinary care team collaboration (see longitudinal plan of care) ?Comprehensive medication review performed; medication list updated in electronic medical record ? ?Diabetes: Goal on Track (progressing): YES. ?Uncontrolled-A1C 7.8%, glucose has improved a bit; backing down on insulin due to recent overlap of mix insulin; current treatment: Tresiba 25 units, Humulin 70/30 mix pen (3o units with each meal), Onglyza 5mg , Farxiga 10mg , metformin; ?Patient's regimen was based on financial constraints and bright health insurance-->now on medicare, so we will do complete overhaul of current diabetes regimen ?Patient would like to go on Humulin 70/30 mix pen since her husband is on that regimen, however will readdress; not ideal (concern for hypoglycemia/overlap with mix+basal+other meds) ?Noted intolerance to Trulicity (may consider Rybelsus, low dose ozempic, mounjaro to decrease insulin requirements) ?Denies personal and family history of Medullary thyroid cancer (MTC) ?Continue tresiba 26 units  ?Continue mix insulin 70/30 to 20-30 units --patient doing 3 times  daily ?Would like to have patient on rapid acting insulin only (not mix) ?Concerned about hypo events ?Recheck tomorrow ?Working to get [employer] CGM ?Continue ongylza (free via az&me patient assistance program) ?Continue farxiga (free via az&me patient assistance program) ?Continue metformin ?Current glucose readings: fasting glucose: >200s, post prandial glucose: <200 ?Reports hypoglycemia ?The patient is asked to make an attempt to improve diet and exercise patterns to aid in medical management of this problem. ?Discussed meal planning options and Plate method for healthy eating ?Avoid sugary drinks and desserts ?Incorporate balanced protein, non starchy veggies, 1 serving of carbohydrate with each meal ?Increase water intake ?Increase physical activity as able ?Current exercise: n/a-->encouraged ?Recommended heart healthy diet, healthy plate method handout given; work on transition DM regimen ?Assessed patient finances. Patient approved for Az[employer]me patient assistance for Farxiga and Onglyza (sub for januvia-easier to get)--patient approved until 12/31/21, insulin ?Hyperlipidemia:  Goal on Track (progressing): YES. ?Uncontrolled-LDL 78 current treatment: rosuvastatin 10mg  daily;  ?Medications previously tried: n/a  ?Current dietary patterns: heart healthy diet, healthy plate method handout given ?Counseled on diet, may increase statin if not at goal with A1c control and lifestyle changes ? ?Hypertension: ?Patient at goal <130/80 ?BP home report-ranges from 110s-130s/65-80 ?Continue current regimen--on ACEi ? ?Patient Goals/Self-Care Activities ?patient will:  ?- take medications as prescribed as evidenced by patient report and record review ?check glucose DAILY OR IF SYMPTOMATIC, document, and provide at future appointments ?collaborate with provider on medication access solutions ?target a minimum of 150 minutes of moderate intensity exercise weekly ?engage in dietary modifications by FOLLOWING HEALTHY PLATE  METHOD/HEART HEALTHY DIET ? ? ?Plan: Next PCP appointment scheduled for:  05/10/21 ? ?Signature ?Regina Eck, PharmD, BCPS ?Clinical Pharmacist, [facility] ?[place]  [place] Phone [phone] ? ? ?Please call the care guide team at [phone]  if you need to cancel or reschedule your appointment.  ? ?The patient verbalized understanding of instructions, educational materials, and care plan provided today and declined offer to receive copy of patient instructions, educational materials, and care plan.  ? ?

## 2021-05-10 ENCOUNTER — Encounter: Payer: Self-pay | Admitting: Family Medicine

## 2021-05-10 ENCOUNTER — Ambulatory Visit (INDEPENDENT_AMBULATORY_CARE_PROVIDER_SITE_OTHER): Payer: Medicare Other | Admitting: Family Medicine

## 2021-05-10 ENCOUNTER — Ambulatory Visit: Payer: Medicare Other | Admitting: Family Medicine

## 2021-05-10 VITALS — BP 112/76 | HR 64 | Temp 97.3°F | Ht 61.0 in | Wt 208.0 lb

## 2021-05-10 DIAGNOSIS — E1165 Type 2 diabetes mellitus with hyperglycemia: Secondary | ICD-10-CM

## 2021-05-10 DIAGNOSIS — Z794 Long term (current) use of insulin: Secondary | ICD-10-CM

## 2021-05-10 DIAGNOSIS — I2510 Atherosclerotic heart disease of native coronary artery without angina pectoris: Secondary | ICD-10-CM | POA: Diagnosis not present

## 2021-05-10 DIAGNOSIS — I700 Atherosclerosis of aorta: Secondary | ICD-10-CM

## 2021-05-10 DIAGNOSIS — M79601 Pain in right arm: Secondary | ICD-10-CM

## 2021-05-10 DIAGNOSIS — I10 Essential (primary) hypertension: Secondary | ICD-10-CM | POA: Diagnosis not present

## 2021-05-10 DIAGNOSIS — E782 Mixed hyperlipidemia: Secondary | ICD-10-CM | POA: Diagnosis not present

## 2021-05-10 LAB — BAYER DCA HB A1C WAIVED: HB A1C (BAYER DCA - WAIVED): 7 % — ABNORMAL HIGH (ref 4.8–5.6)

## 2021-05-10 MED ORDER — LISINOPRIL 2.5 MG PO TABS: 2.5000 mg | ORAL_TABLET | Freq: Every day | ORAL | 1 refills | Status: DC

## 2021-05-10 MED ORDER — BACLOFEN 10 MG PO TABS: 10.0000 mg | ORAL_TABLET | Freq: Three times a day (TID) | ORAL | 2 refills | Status: DC | PRN

## 2021-05-10 NOTE — Progress Notes (Signed)
? ?Assessment & Plan:  ?1. Uncontrolled type 2 diabetes mellitus with hyperglycemia, with long-term current use of insulin ([place]) ?A1c today of 7.0, which is decreased from 7.6 three months ago. ?- Diabetes is not at goal of A1c < 7, but is improving. ?- Medications: continue current medications ?- Home glucose monitoring: our clinical pharmacist is trying to get patient a freestyle libre; patient will continue monitoring with glucometer until then ?- Patient is currently taking a statin. Patient is taking an ACE-inhibitor/ARB.  ?- Instruction/counseling given: discussed diet ? ?Diabetes Health Maintenance Due  ?Topic Date Due  ? OPHTHALMOLOGY EXAM  06/23/2021  ? HEMOGLOBIN A1C  08/09/2021  ? FOOT EXAM  11/01/2021  ?  ?Lab Results  ?Component Value Date  ? LABMICR <3.0 11/01/2020  ? LABMICR <3.0 09/09/2019  ? MICROALBUR 1.8 05/02/2017  ? ?- Lipid panel ?- CBC with Differential/Platelet ?- CMP14+EGFR ?- Bayer DCA Hb A1c Waived ?- Vitamin B12 ? ?2. Essential hypertension, benign ?Well controlled on current regimen.  ?- Lipid panel ?- CBC with Differential/Platelet ?- CMP14+EGFR ?- lisinopril (ZESTRIL) 2.5 MG tablet; Take 1 tablet (2.5 mg total) by mouth daily.  Dispense: 90 tablet; Refill: 1 ? ?3. Mixed hyperlipidemia ?Well controlled on current regimen.  ?- Lipid panel ?- CBC with Differential/Platelet ?- CMP14+EGFR ? ?4. Morbid obesity ([place]) ?Encouraged healthy eating and exercise. ?- Lipid panel ?- CBC with Differential/Platelet ?- CMP14+EGFR ? ?5. Aortic atherosclerosis ([place]) ?Continue aspirin and rosuvastatin. ?- Lipid panel ?- CMP14+EGFR ? ?6. Coronary artery disease involving native coronary artery of native heart without angina pectoris ?Continue aspirin and rosuvastatin. ?- Lipid panel ?- CMP14+EGFR ? ?7. Right arm pain ?Managed by Ortho.  Patient has an upcoming MRI scheduled. ?- baclofen (LIORESAL) 10 MG tablet; Take 1 tablet (10 mg total) by mouth 3 (three) times daily as needed.  Dispense: 90 each;  Refill: 2 ? ? ?Return in about 3 months (around 08/10/2021) for follow-up of chronic medication conditions; also one week with Almyra Free for DM. ? ?Hendricks Limes, MSN, APRN, FNP-C ?[place] ? ?Subjective:  ? ? Patient ID: Sara Stuart, female    DOB: 1955-09-05, 66 y.o.   MRN: [id number] ? ?Patient Care Team: ?Loman Brooklyn, FNP as PCP - General (Family Medicine) ?Lavera Guise, [facility] as Pharmacist (Family Medicine) ?Celestia Khat, OD (Optometry)  ? ?Chief Complaint:  ?Chief Complaint  ?Patient presents with  ? Medical Management of Chronic Issues  ?  7 week chronic follow up  ? ? ?HPI: ?Sara Stuart is a 66 y.o. female presenting on 05/10/2021 for Medical Management of Chronic Issues (7 week chronic follow up) ? ?Diabetes with hypertension and hyperlipidemia: Patient presents for follow up of diabetes. Current symptoms include: hyperglycemia and hypoglycemia . Known diabetic complications: cardiovascular disease. Medication compliance: Yes. Current diet: in general, an "unhealthy" diet. Current exercise: walking. Home blood sugar records:  she is checking fasting, before lunch, before dinner, and at bedtime. Fasting ranges 88-209; before lunch ranges 109-190; before dinner ranges 58-243; bedtime ranges 142-272 . Is she  on ACE inhibitor or angiotensin II receptor blocker? Yes (Lisinopril). Is she on a statin? Yes (Rosuvastatin).  ? ? ?New complaints: ?None ? ? ?Social history: ? ?Relevant past medical, surgical, family and social history reviewed and updated as indicated. Interim medical history since our last visit reviewed. ? ?Allergies and medications reviewed and updated. ? ?DATA REVIEWED: CHART IN EPIC ? ?ROS: Negative unless specifically indicated above in HPI.  ? ? ?Current Outpatient Medications:  ?  aspirin EC 81 MG tablet, Take 1 tablet (81 mg total) by mouth daily., Disp: 30 tablet, Rfl:  ?  baclofen (LIORESAL) 10 MG tablet, Take 1 tablet (10 mg total) by mouth 3 (three) times daily  as needed., Disp: 60 each, Rfl: 2 ?  Continuous Blood Gluc Receiver (FREESTYLE LIBRE 2 READER) DEVI, Use to test blood sugar continuously. DX: E11.65, Disp: 1 each, Rfl: 0 ?  Continuous Blood Gluc Sensor (FREESTYLE LIBRE 2 SENSOR) MISC, Use to test blood sugar continuously.  Replace sensor every 14 days. DX: E11.65, Disp: 2 each, Rfl: 11 ?  dapagliflozin propanediol (FARXIGA) 10 MG TABS tablet, Take 1 tablet (10 mg total) by mouth daily before breakfast., Disp: 90 tablet, Rfl: 4 ?  diltiazem (DILT-XR) 120 MG 24 hr capsule, Take 1 capsule (120 mg total) by mouth daily., Disp: 90 capsule, Rfl: 1 ?  fluticasone (FLONASE) 50 MCG/ACT nasal spray, Place 2 sprays into both nostrils daily as needed., Disp: , Rfl:  ?  glucose blood test strip, Test BS four times daily Dx E11.65, Disp: 400 each, Rfl: 3 ?  ibuprofen (ADVIL) 800 MG tablet, TAKE 1 TABLET BY MOUTH EVERY 8 HOURS AS NEEDED FOR MODERATE PAIN, Disp: 90 tablet, Rfl: 0 ?  insulin degludec (TRESIBA FLEXTOUCH) 100 UNIT/ML FlexTouch Pen, Inject 26 Units into the skin daily., Disp: 15 mL, Rfl: 3 ?  insulin isophane & regular human KwikPen (HUMULIN 70/30 KWIKPEN) (70-30) 100 UNIT/ML KwikPen, Inject 35 Units into the skin with breakfast, with lunch, and with evening meal., Disp: 30 mL, Rfl: 11 ?  Insulin Pen Needle (B-D ULTRAFINE III SHORT PEN) 31G X 8 MM MISC, 1 each by Does not apply route as directed., Disp: 100 each, Rfl: 3 ?  lisinopril (ZESTRIL) 2.5 MG tablet, Take 1 tablet (2.5 mg total) by mouth daily., Disp: 90 tablet, Rfl: 1 ?  metFORMIN (GLUCOPHAGE-XR) 500 MG 24 hr tablet, Take 2 tablets (1,000 mg total) by mouth 2 (two) times daily., Disp: 360 tablet, Rfl: 1 ?  metoprolol tartrate (LOPRESSOR) 25 MG tablet, Take 1 tablet (25 mg total) by mouth daily., Disp: 90 tablet, Rfl: 1 ?  nitroGLYCERIN (NITROSTAT) 0.4 MG SL tablet, DISSOLVE ONE TABLET UNDER THE TONGUE EVERY 5 MINUTES AS NEEDED FOR CHEST PAIN.  DO NOT EXCEED A TOTAL OF 3 DOSES IN 15 MINUTES, Disp: 25 tablet,  Rfl: 0 ?  omeprazole (PRILOSEC) 40 MG capsule, Take 1 capsule by mouth once daily, Disp: 90 capsule, Rfl: 0 ?  rosuvastatin (CRESTOR) 10 MG tablet, Take 1 tablet by mouth once daily, Disp: 90 tablet, Rfl: 1 ?  saxagliptin HCl (ONGLYZA) 5 MG TABS tablet, Take 1 tablet (5 mg total) by mouth daily., Disp: 90 tablet, Rfl: 4 ?  spironolactone (ALDACTONE) 25 MG tablet, Take 1/2 (one-half) tablet by mouth once daily, Disp: 45 tablet, Rfl: 0 ?  traZODone (DESYREL) 50 MG tablet, TAKE 1 TABLET BY MOUTH AT BEDTIME, Disp: 90 tablet, Rfl: 0 ?  triamterene-hydrochlorothiazide (DYAZIDE) 37.5-25 MG capsule, Take 1 each (1 capsule total) by mouth daily., Disp: 90 capsule, Rfl: 1  ? ?Allergies  ?Allergen Reactions  ? Trulicity [Dulaglutide]   ?  GI intolerance  ? ?Past Medical History:  ?Diagnosis Date  ? Arthritis   ? Chronic back pain   ? spondylolishthesis  ? DDD (degenerative disc disease), lumbosacral   ? Diabetes mellitus without complication ([place]) diagnosed in 02/2014  ? takes Metformin and Glipizide daily  ? GERD (gastroesophageal reflux disease)   ? takes Omeprazole  daily  ? Gout   ? takes Colchicine daily as needed  ? Hypertension   ? takes Dyazide and Diltiazem daily  ? Nocturia   ? Polio   ? PONV (postoperative nausea and vomiting)   ? Wears glasses   ?  ?Past Surgical History:  ?Procedure Laterality Date  ? BACK SURGERY  07/2012  ? bladder tacked     ? COLONOSCOPY    ? FOOT SURGERY    ? more than 5 surgeries on right foot related to polio  ? surgery for polio    ? states x 5   ? TOTAL ABDOMINAL HYSTERECTOMY  2002  ? TUBAL PREGNANCY  ? TUBAL LIGATION    ?  ?Social History  ? ?Socioeconomic History  ? Marital status: Married  ?  Spouse name: Not on file  ? Number of children: Not on file  ? Years of education: Not on file  ? Highest education level: Not on file  ?Occupational History  ? Occupation: Retired  ?Tobacco Use  ? Smoking status: Never  ? Smokeless tobacco: Never  ?Vaping Use  ? Vaping Use: Never used  ?Substance  and Sexual Activity  ? Alcohol use: No  ? Drug use: No  ? Sexual activity: Yes  ?  Birth control/protection: Surgical  ?Other Topics Concern  ? Not on file  ?Social History Narrative  ? Not on file  ? ?Social

## 2021-05-11 LAB — CBC WITH DIFFERENTIAL/PLATELET
Basophils Absolute: 0.1 10*3/uL (ref 0.0–0.2)
Basos: 1 %
EOS (ABSOLUTE): 0.4 10*3/uL (ref 0.0–0.4)
Eos: 5 %
Hematocrit: 41.2 % (ref 34.0–46.6)
Hemoglobin: 13.7 g/dL (ref 11.1–15.9)
Immature Grans (Abs): 0 10*3/uL (ref 0.0–0.1)
Immature Granulocytes: 0 %
Lymphocytes Absolute: 2.1 10*3/uL (ref 0.7–3.1)
Lymphs: 26 %
MCH: 26.6 pg (ref 26.6–33.0)
MCHC: 33.3 g/dL (ref 31.5–35.7)
MCV: 80 fL (ref 79–97)
Monocytes Absolute: 0.5 10*3/uL (ref 0.1–0.9)
Monocytes: 6 %
Neutrophils Absolute: 4.8 10*3/uL (ref 1.4–7.0)
Neutrophils: 62 %
Platelets: 286 10*3/uL (ref 150–450)
RBC: 5.15 x10E6/uL (ref 3.77–5.28)
RDW: 14.2 % (ref 11.7–15.4)
WBC: 7.8 10*3/uL (ref 3.4–10.8)

## 2021-05-11 LAB — CMP14+EGFR
ALT: 14 IU/L (ref 0–32)
AST: 19 IU/L (ref 0–40)
Albumin/Globulin Ratio: 1.8 (ref 1.2–2.2)
Albumin: 4.4 g/dL (ref 3.8–4.8)
Alkaline Phosphatase: 79 IU/L (ref 44–121)
BUN/Creatinine Ratio: 22 (ref 12–28)
BUN: 21 mg/dL (ref 8–27)
Bilirubin Total: 0.2 mg/dL (ref 0.0–1.2)
CO2: 21 mmol/L (ref 20–29)
Calcium: 9.4 mg/dL (ref 8.7–10.3)
Chloride: 102 mmol/L (ref 96–106)
Creatinine, Ser: 0.94 mg/dL (ref 0.57–1.00)
Globulin, Total: 2.5 g/dL (ref 1.5–4.5)
Glucose: 189 mg/dL — ABNORMAL HIGH (ref 70–99)
Potassium: 3.9 mmol/L (ref 3.5–5.2)
Sodium: 139 mmol/L (ref 134–144)
Total Protein: 6.9 g/dL (ref 6.0–8.5)
eGFR: 67 mL/min/{1.73_m2} (ref >59)

## 2021-05-11 LAB — LIPID PANEL
Chol/HDL Ratio: 2.6 ratio (ref 0.0–4.4)
Cholesterol, Total: 146 mg/dL (ref 100–199)
HDL: 57 mg/dL (ref >39)
LDL Chol Calc (NIH): 67 mg/dL (ref 0–99)
Triglycerides: 122 mg/dL (ref 0–149)
VLDL Cholesterol Cal: 22 mg/dL (ref 5–40)

## 2021-05-11 LAB — VITAMIN B12: Vitamin B-12: 287 pg/mL (ref 232–1245)

## 2021-05-18 ENCOUNTER — Ambulatory Visit: Payer: Medicare Other | Admitting: Pharmacist

## 2021-05-18 DIAGNOSIS — I10 Essential (primary) hypertension: Secondary | ICD-10-CM

## 2021-05-18 DIAGNOSIS — E1165 Type 2 diabetes mellitus with hyperglycemia: Secondary | ICD-10-CM

## 2021-05-18 NOTE — Progress Notes (Signed)
  Care Management   Follow Up Note  05/18/2021 Name: Hadassah Potenza MRN: [id number] DOB: [birth year]   Referred by: Loman Brooklyn, FNP Reason for referral : Chronic Care Management and Diabetes   An unsuccessful telephone outreach was attempted today. The patient was referred to the case management team for assistance with care management and care coordination.   Follow Up Plan: Telephone follow up appointment with care management team member scheduled for:7 days    Regina Eck, PharmD, BCPS Clinical Pharmacist, [place]  II Phone [phone]

## 2021-05-19 ENCOUNTER — Telehealth: Payer: Self-pay | Admitting: Pharmacist

## 2021-05-19 NOTE — Telephone Encounter (Signed)
Patient on tresiba 20 units qhs Humalog 14 units with meals Patient using libre Attempting to get rid of 70/30 overlap/hypoglycemia

## 2021-05-24 DIAGNOSIS — M75111 Incomplete rotator cuff tear or rupture of right shoulder, not specified as traumatic: Secondary | ICD-10-CM | POA: Diagnosis not present

## 2021-05-24 DIAGNOSIS — M19011 Primary osteoarthritis, right shoulder: Secondary | ICD-10-CM | POA: Diagnosis not present

## 2021-05-24 DIAGNOSIS — M75121 Complete rotator cuff tear or rupture of right shoulder, not specified as traumatic: Secondary | ICD-10-CM | POA: Diagnosis not present

## 2021-05-30 ENCOUNTER — Ambulatory Visit: Payer: Medicare Other | Admitting: Orthopedic Surgery

## 2021-05-30 DIAGNOSIS — S46011D Strain of muscle(s) and tendon(s) of the rotator cuff of right shoulder, subsequent encounter: Secondary | ICD-10-CM | POA: Diagnosis not present

## 2021-05-31 ENCOUNTER — Other Ambulatory Visit: Payer: Self-pay

## 2021-05-31 ENCOUNTER — Ambulatory Visit: Payer: Medicare Other | Admitting: Pharmacist

## 2021-05-31 ENCOUNTER — Encounter: Payer: Self-pay | Admitting: Orthopedic Surgery

## 2021-05-31 DIAGNOSIS — E1165 Type 2 diabetes mellitus with hyperglycemia: Secondary | ICD-10-CM

## 2021-05-31 DIAGNOSIS — E782 Mixed hyperlipidemia: Secondary | ICD-10-CM

## 2021-05-31 DIAGNOSIS — Z7984 Long term (current) use of oral hypoglycemic drugs: Secondary | ICD-10-CM | POA: Diagnosis not present

## 2021-05-31 DIAGNOSIS — Z794 Long term (current) use of insulin: Secondary | ICD-10-CM

## 2021-05-31 DIAGNOSIS — I10 Essential (primary) hypertension: Secondary | ICD-10-CM

## 2021-05-31 DIAGNOSIS — Z01818 Encounter for other preprocedural examination: Secondary | ICD-10-CM

## 2021-05-31 NOTE — Patient Instructions (Signed)
Visit Information  Following are the goals we discussed today:  (Copy and paste patient goals from clinical care plan here)  Plan: Telephone follow up appointment with care management team member scheduled for:  3 WEEKS  Signature Kieth Brightly, PharmD, BCPS Clinical Pharmacist, [facility]  II Phone [phone]   Please call the care guide team at [phone] if you need to cancel or reschedule your appointment.   Patient verbalizes understanding of instructions and care plan provided today and agrees to view in MyChart. Active MyChart status and patient understanding of how to access instructions and care plan via MyChart confirmed with patient.

## 2021-05-31 NOTE — Progress Notes (Signed)
Orthopaedic Clinic Return  Assessment: Sara Stuart is Sara 66 y.o. female with the following: Right shoulder rotator cuff tear  Plan: Sara Stuart has sustained an injury to her right rotator cuff, specifically the supraspinatus, and Sara small injury to her infraspinatus.  I do think this is amenable to arthroscopic repair.  She has been dealing with this long enough.  She has tried medications and physical therapy without improvement.  After discussing the procedure in great length, including the recovery, she is interested in proceeding with surgery.  We will work to obtain medical clearance quickly.  I will reach out to her primary care provider.  In addition, we will work to authorize her surgery.  If we are successful, we will plan to proceed with surgery on June 15.  We will continue to communicate with the patient regarding timing of surgery.  Risks and benefits of the surgery, including, but not limited to infection, bleeding, persistent pain, need for further surgery, shoulder stiffness and more severe complications associated with anesthesia were discussed with the patient.  The patient has elected to proceed.    Follow-up: Return for After surgery; DOS June 15, 2021.   Subjective:  Chief Complaint  Patient presents with   Shoulder Pain    RT/review MRI    History of Present Illness: Sara Stuart is Sara 66 y.o. female who returns to clinic for repeat evaluation of right shoulder pain.  She sustained an injury to her shoulder, almost 1 year ago.  She is work with therapy.  She has tried over-the-counter pain medications, without improvement.  She previously had Sara humerus MRI, which was nondiagnostic, as she did not tolerate this well.  As Sara result, we have obtained Sara right shoulder MRI, using Sara special unit and medications to allow her to tolerate this procedure.  She continues to have pain in the shoulder.  She is difficulty with overhead motion.  She is interested in surgery  Review of  Systems: No fevers or chills No numbness or tingling No chest pain No shortness of breath No bowel or bladder dysfunction No GI distress No headaches    Objective: There were no vitals taken for this visit.  Physical Exam:  Alert and oriented.  No acute distress.  Right shoulder without deformity.  Difficulty with overhead motion.  Positive drop arm test.  Pain in the empty can testing position.  Positive O'Brien's.  Tenderness to palpation over the anterior shoulder.  4+/5 supraspinatus and infraspinatus strength testing.  Internal rotation to her lumbar spine.  Negative belly press.  IMAGING: I personally ordered and reviewed the following images:  Right shoulder MRI  Impression   high-grade partial and full-thickness tearing of the distal supraspinatus tendon with retraction of up to 2 cm  Infraspinatus tendinosis and mild partial tearing anteriorly  Subscapularis tendinosis without tear  Moderate AC joint arthrosis   Sara Stuart Sara Stevens Magwood, MD 05/31/2021 8:48 AM   

## 2021-05-31 NOTE — Progress Notes (Signed)
Chronic Care Management Pharmacy Note  05/31/2021 Name:  Sara Stuart MRN:  [id number] DOB:  [birth year]  Summary:  Diabetes: Goal on Track (progressing): YES. "Uncontrolled"- A1C 7.8-->7.0%,  A1C practically at goal for patient, however still having HYPERglycemia and HYPOglycemia glucose has improved a bit; backing down on insulin due to recent overlap of mix insulin;  Current treatment: Tresiba 20-25 units AT BEDTIME, Humulin 70/30 mix pen (20-3o units with each meal), Onglyza 15m, Farxiga 150m metformin; Humulin 70/30 mix pen is familiar to her since her husband is also on--we have tried x2 to transition her over to basal/bolus, but this has been unsuccessful.  Patient resorts back to previous regimen if she begins to run high (concern for hypoglycemia/overlap with mix+basal+other meds) Noted intolerance to Trulicity (may consider Rybelsus, low dose ozempic, mounjaro to decrease insulin requirements) Denies personal and family history of Medullary thyroid cancer (MTC) Continue Tresiba 20-25 units  Continue mix insulin 70/30 to 20-30 units --patient doing 2-3 times daily (COUNSELED PATIENT TO BACK DOWN IN MEAL TIME INSULIN WHEN EATING LESS--THIS IS USUALLY AT LUNCH TIME; DO NOT WAIT MORE THE 4 HOURS TO EAT DINNER; USE LIBRE TO GUIDE YOUR TREATMENT AND FOOD DECISIONS) Would like to have patient on rapid acting insulin only (not mix)--this has not worked out x2 Concerned about hypo events-->patient denies hypoglycemia in the past 2 weeks; counseled on administration of insulin and eating 3 steady meals daily (she was waiting to late to eat dinner and going low) Working to get liEnergy [employer]GM 2 (cost is $35/month) Continue ongylza (free via az&me patient assistance program) Continue farxiga (free via az&me patient assistance program) Continue metformin Current glucose readings: fasting glucose: <200, post prandial glucose: 200s Reports hypoglycemia has improved/no events x2 weeks The patient is  asked to make an attempt to improve diet and exercise patterns to aid in medical management of this problem. Discussed meal planning options and Plate method for healthy eating Avoid sugary drinks and desserts Incorporate balanced protein, non starchy veggies, 1 serving of carbohydrate with each meal Increase water intake Increase physical activity as able Current exercise: n/a-->encouraged Recommended heart healthy diet, healthy plate method handout given; work on transition DM regimen Assessed patient finances.  Medication Assistance:  -HUMALOG MIX PEN 70/30 [place].  Enrollment ends 12/31/21--SHIPS TO PATIENTS HOME ([phone]-ONGLYZA & FARXIGA obtained through [place] medication assistance program.  Enrollment ends 12/31/21--SHIPS TO PATIENTS HOME ([phone] Subjective: GrTrica Userys an [age].o. year old female who is a primary patient of JoLoman BrooklynFNP.  The CCM team was consulted for assistance with disease management and care coordination needs.    Engaged with patient by telephone for follow up visit in response to provider referral for pharmacy case management and/or care coordination services.   Consent to Services:  The patient was given information about Chronic Care Management services, agreed to services, and gave verbal consent prior to initiation of services.  Please see initial visit note for detailed documentation.   Patient Care Team: JoLoman BrooklynFNP as PCP - General (Family Medicine) PrLavera GuiseRPKenmore [facility]als Pharmacist (Family Medicine) JoCelestia KhatODGeorgiaOptometry)  Objective:  Lab Results  Component Value Date   CREATININE 0.94 05/10/2021   CREATININE 0.87 02/09/2021   CREATININE 0.96 11/01/2020    Lab Results  Component Value Date   HGBA1C 7.0 (H) 05/10/2021   Last diabetic Eye exam:  Lab Results  Component Value Date/Time   HMDIABEYEEXA  No Retinopathy 06/23/2020 12:00 AM     Last diabetic Foot exam: No results found for: HMDIABFOOTEX      Component Value Date/Time   CHOL 146 05/10/2021 0857   TRIG 122 05/10/2021 0857   HDL 57 05/10/2021 0857   CHOLHDL 2.6 05/10/2021 0857   CHOLHDL 3.8 05/02/2017 1617   LDLCALC 67 05/10/2021 0857   LDLCALC 111 (H) 05/02/2017 1617       Latest Ref Rng & Units 05/10/2021    8:57 AM 02/09/2021    2:36 PM 11/01/2020   10:22 AM  Hepatic Function  Total Protein 6.0 - 8.5 g/dL 6.9   7.3   7.0    Albumin 3.8 - 4.8 g/dL 4.4   4.5   4.2    AST 0 - 40 IU/L _0 ALT 0 - 32 IU/L _1 Alk Phosphatase 44 - 121 IU/L 79   82   80    Total Bilirubin 0.0 - 1.2 mg/dL 0.2   0.2   0.3      Lab Results  Component Value Date/Time   TSH 1.41 05/02/2017 04:17 PM   FREET4 1.1 05/02/2017 04:17 PM       Latest Ref Rng & Units 05/10/2021    8:57 AM 02/09/2021    2:36 PM 11/01/2020   10:22 AM  CBC  WBC 3.4 - 10.8 x10E3/uL 7.8   11.7   9.0    Hemoglobin 11.1 - 15.9 g/dL 13.7   14.1   13.3    Hematocrit 34.0 - 46.6 % 41.2   43.2   42.3    Platelets 150 - 450 x10E3/uL 286   314   306      Lab Results  Component Value Date/Time   VD25OH 22 (L) 05/02/2017 04:17 PM    Clinical ASCVD: No  The 10-year ASCVD risk score (Arnett DK, et al., 2019) is: 9.1%   Values used to calculate the score:     Age: 14 years     Sex: Female     Is Non-Hispanic African American: No     Diabetic: Yes     Tobacco smoker: No     Systolic Blood Pressure: 749 mmHg     Is BP treated: Yes     HDL Cholesterol: 57 mg/dL     Total Cholesterol: 146 mg/dL    Other: (CHADS2VASc if Afib, PHQ9 if depression, MMRC or CAT for COPD, ACT, DEXA)  Social History   Tobacco Use  Smoking Status Never  Smokeless Tobacco Never   BP Readings from Last 3 Encounters:  05/10/21 112/76  03/21/21 110/71  02/09/21 115/80   Pulse Readings from Last 3 Encounters:  05/10/21 64  03/21/21 (!) 57  02/09/21 92   Wt Readings from Last 3 Encounters:   05/10/21 208 lb (94.3 kg)  03/21/21 210 lb 12.8 oz (95.6 kg)  02/09/21 209 lb 6.4 oz (95 kg)    Assessment: Review of patient past medical history, allergies, medications, health status, including review of consultants reports, laboratory and other test data, was performed as part of comprehensive evaluation and provision of chronic care management services.   SDOH:  (Social Determinants of Health) assessments and interventions performed:    CCM Care Plan  Allergies  Allergen Reactions   Trulicity [Dulaglutide]     GI intolerance    Medications Reviewed Today     Reviewed by Blanca Friend,  Royce Macadamia, [facility] (Pharmacist) on 05/31/21 at [place] [facility] Status: <None>   Medication Order Taking? Sig Documenting Provider Last Dose Status Informant  aspirin EC 81 MG tablet [id number] No Take 1 tablet (81 mg total) by mouth daily. Orson Eva, MD Taking Active Self           Med Note [facility], [facility] A   Thu Mar 14, 2017  2:26 PM)    baclofen (LIORESAL) 10 MG tablet [id number] No Take 1 tablet (10 mg total) by mouth 3 (three) times daily as needed. Loman Brooklyn, FNP Taking Active   Continuous Blood Gluc Receiver (FREESTYLE LIBRE 2 READER) DEVI [id number] No Use to test blood sugar continuously. DX: E11.65 Loman Brooklyn, FNP Taking Active   Continuous Blood Gluc Sensor (FREESTYLE LIBRE 2 SENSOR) MISC [id number] No Use to test blood sugar continuously.  Replace sensor every 14 days. DX: E11.65 Loman Brooklyn, FNP Taking Active   dapagliflozin propanediol (FARXIGA) 10 MG TABS tablet [id number] No Take 1 tablet (10 mg total) by mouth daily before breakfast. Loman Brooklyn, FNP Taking Active   diltiazem (DILT-XR) 120 MG 24 hr capsule [id number] No Take 1 capsule (120 mg total) by mouth daily. Loman Brooklyn, FNP Taking Active   fluticasone [facility]) 50 MCG/ACT nasal spray [id number] No Place 2 sprays into both nostrils daily as needed. [provider] Taking Active   glucose blood test strip  [id number] No Test BS four times daily Dx E11.65 Ronnie Doss M, DO Taking Active   ibuprofen (ADVIL) 800 MG tablet [id number] No TAKE 1 TABLET BY MOUTH EVERY 8 HOURS AS NEEDED FOR MODERATE PAIN Loman Brooklyn, FNP Taking Active   insulin degludec (TRESIBA FLEXTOUCH) 100 UNIT/ML FlexTouch Pen [id number] No Inject 26 Units into the skin daily. Loman Brooklyn, FNP Taking Active            Med Note Blanca Friend, Royce Macadamia   Wed May 31, 2021  4:10 PM) SAMPLES  insulin isophane & regular human KwikPen (HUMULIN 70/30 KWIKPEN) (70-30) 100 UNIT/ML KwikPen [id number] No Inject 35 Units into the skin with breakfast, with lunch, and with evening meal. Loman Brooklyn, FNP Taking Active   Insulin Pen Needle (B-D ULTRAFINE III SHORT PEN) 31G X 8 MM MISC [id number] No 1 each by Does not apply route as directed. Cassandria Anger, MD Taking Active Self  lisinopril (ZESTRIL) 2.5 MG tablet [id number] No Take 1 tablet (2.5 mg total) by mouth daily. Loman Brooklyn, FNP Taking Active   metFORMIN (GLUCOPHAGE-XR) 500 MG 24 hr tablet [id number] No Take 2 tablets (1,000 mg total) by mouth 2 (two) times daily. Loman Brooklyn, FNP Taking Active   metoprolol tartrate (LOPRESSOR) 25 MG tablet [id number] No Take 1 tablet (25 mg total) by mouth daily. Loman Brooklyn, FNP Taking Active   nitroGLYCERIN (NITROSTAT) 0.4 MG SL tablet [id number] No DISSOLVE ONE TABLET UNDER THE TONGUE EVERY 5 MINUTES AS NEEDED FOR CHEST PAIN.  DO NOT EXCEED A TOTAL OF 3 DOSES IN 15 MINUTES Fay Records, MD Taking Active   omeprazole (PRILOSEC) 40 MG capsule [id number] No Take 1 capsule by mouth once daily Loman Brooklyn, FNP Taking Active   rosuvastatin (CRESTOR) 10 MG tablet [id number] No Take 1 tablet by mouth once daily Evelina Dun A, FNP Taking Active   saxagliptin HCl (ONGLYZA) 5 MG TABS tablet [id number] No Take 1 tablet (5 mg total) by mouth daily. Loman Brooklyn, FNP Taking Active  spironolactone (ALDACTONE) 25 MG tablet [id number] No  Take 1/2 (one-half) tablet by mouth once daily Hendricks Limes F, FNP Taking Active   traZODone (DESYREL) 50 MG tablet [id number] No TAKE 1 TABLET BY MOUTH AT BEDTIME Hendricks Limes F, FNP Taking Active   triamterene-hydrochlorothiazide (DYAZIDE) 37.5-25 MG capsule [id number] No Take 1 each (1 capsule total) by mouth daily. Loman Brooklyn, FNP Taking Active   Med List Note Loman Brooklyn, [place] 02/09/21 1450): Prescription assistance for Onglyza and Farxiga.            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   Morbid obesity ([place]) 12/29/2012   Degenerative spondylolisthesis 07/08/2012   Spinal stenosis, lumbar region, with neurogenic claudication 07/08/2012    Immunization History  Administered Date(s) Administered   Influenza, High Dose Seasonal PF 10/22/2020   Influenza, Quadrivalent, Recombinant, Inj, Pf 09/18/2018   Influenza,inj,Quad PF,6+ Mos 09/21/2017   Influenza,inj,quad, With Preservative 01/05/2017, 09/21/2017   Influenza,trivalent, recombinat, inj, PF 10/02/2014   Influenza-Unspecified 09/18/2018, 10/02/2019, 10/27/2020   Moderna Sars-Covid-2 Vaccination 11/03/2019, 12/03/2019, 06/16/2020   PNEUMOCOCCAL CONJUGATE-20 11/01/2020   Pneumococcal Polysaccharide-23 09/10/2017   Tdap 01/07/2018   Zoster Recombinat (Shingrix) 02/09/2021    Conditions to be addressed/monitored: HTN, HLD, and DMII  Care Plan : PHARMD MEDICATION  MANAGEMENT  Updates made by Lavera Guise, RPH since 06/01/2021 12:00 AM     Problem: DISEASE PROGRESSION PREVENTION      Long-Range Goal: T2DM, HLD, HTN PHARMD GOAL   Recent Progress: Not on track  Priority: High  Note:   Current Barriers:  Unable to independently afford treatment regimen Unable to achieve control of T2DM  Suboptimal therapeutic regimen for T2DM  Pharmacist Clinical Goal(s):  patient will verbalize ability to afford treatment regimen achieve control of T2DM, HLD as evidenced by GOAL A1C, IMPROVED QUALITY OF LIFE, LDL<70 maintain control of T2DM, HLD as evidenced by GOAL A1C, IMPROVED QUALITY OF LIFE, LDL<70  adhere to plan to optimize therapeutic regimen for T2DM, HLD as evidenced by report of adherence to recommended medication management changes through collaboration with PharmD and provider.   Interventions: 1:1 collaboration with Loman Brooklyn, FNP regarding development and update of comprehensive plan of care as evidenced by provider attestation and co-signature Inter-disciplinary care team collaboration (see longitudinal plan of care) Comprehensive medication review performed; medication list updated in electronic medical record  Diabetes: Goal on Track (progressing): YES. "Uncontrolled"- A1C 7.8-->7.0%,  A1C practically at goal for patient, however still having HYPERglycemia and HYPOglycemia glucose has improved a bit; backing down on insulin due to recent overlap of mix insulin;  Current treatment: Tresiba 20-25 units AT BEDTIME, Humulin 70/30 mix pen (20-3o units with each meal), Onglyza 20m, Farxiga 138m metformin; Humulin 70/30 mix pen is familiar to her since her husband is also on--we have tried x2 to transition her over to basal/bolus, but this has been unsuccessful.  Patient resorts back to previous regimen if she begins to run high (concern for hypoglycemia/overlap with mix+basal+other meds) Noted intolerance  to Trulicity (may consider Rybelsus,  low dose ozempic, mounjaro to decrease insulin requirements) Denies personal and family history of Medullary thyroid cancer (MTC) Continue Tresiba 20-25 units  Continue mix insulin 70/30 to 20-30 units --patient doing 2-3 times daily (COUNSELED PATIENT TO BACK DOWN IN MEAL TIME INSULIN WHEN EATING LESS--THIS IS USUALLY AT LUNCH TIME; DO NOT WAIT MORE THE 4 HOURS TO EAT DINNER; USE LIBRE TO GUIDE YOUR TREATMENT AND FOOD DECISIONS) Would like to have patient on rapid acting insulin only (not mix)--this has not worked out x2 Concerned about hypo events-->patient denies hypoglycemia in the past 2 weeks; counseled on administration of insulin and eating 3 steady meals daily (she was waiting to late to eat dinner and going low) Working to get [employer] CGM 2 (cost is $35/month) Continue ongylza (free via az&me patient assistance program) Continue farxiga (free via az&me patient assistance program) Continue metformin Current glucose readings: fasting glucose: <200, post prandial glucose: 200s Reports hypoglycemia has improved/no events x2 weeks The patient is asked to make an attempt to improve diet and exercise patterns to aid in medical management of this problem. Discussed meal planning options and Plate method for healthy eating Avoid sugary drinks and desserts Incorporate balanced protein, non starchy veggies, 1 serving of carbohydrate with each meal Increase water intake Increase physical activity as able Current exercise: n/a-->encouraged Recommended heart healthy diet, healthy plate method handout given; work on transition DM regimen Assessed patient finances. Patient approved for Az&[place] patient assistance for Farxiga and Onglyza (sub for januvia-easier to get via patient assistance)--patient approved until 12/31/21, Humalog mix insulin 70/30--free via [facility] patient assistance  Hyperlipidemia:  Goal on Track (progressing): YES. Uncontrolled-LDL 78 current treatment: rosuvastatin 90m daily;   Medications previously tried: n/a  Current dietary patterns: heart healthy diet, healthy plate method handout given Counseled on diet, may increase statin if not at goal with A1c control and lifestyle changes  Hypertension: Patient at goal <130/80 BP home report-ranges from 110s-130s/65-80 Continue current regimen--on ACEi  Patient Goals/Self-Care Activities patient will:  - take medications as prescribed as evidenced by patient report and record review check glucose DAILY OR IF SYMPTOMATIC, document, and provide at future appointments collaborate with provider on medication access solutions target a minimum of 150 minutes of moderate intensity exercise weekly engage in dietary modifications by FOLLOWING HEALTHY PLATE METHOD/HEART HEALTHY DIET      Medication Assistance:  -HUMALOG MIX PEN 70/30 [place].  Enrollment ends 12/31/21--SHIPS TO PATIENTS HOME ([phone] -ONGLYZA & FARXIGA obtained through [place] medication assistance program.  Enrollment ends 12/31/21--SHIPS TO PATIENTS HOME ([phone]  Follow Up:  Patient agrees to Care Plan and Follow-up.  Plan: Telephone follow up appointment with care management team member scheduled for:  3 WEEKS  JRegina Eck PharmD, BCPS Clinical Pharmacist, [place] Phone [phone]

## 2021-05-31 NOTE — H&P (View-Only) (Signed)
Orthopaedic Clinic Return  Assessment: Sara Stuart is a 66 y.o. female with the following: Right shoulder rotator cuff tear  Plan: [name] has sustained an injury to her right rotator cuff, specifically the supraspinatus, and a small injury to her infraspinatus.  I do think this is amenable to arthroscopic repair.  She has been dealing with this long enough.  She has tried medications and physical therapy without improvement.  After discussing the procedure in great length, including the recovery, she is interested in proceeding with surgery.  We will work to obtain medical clearance quickly.  I will reach out to her primary care provider.  In addition, we will work to authorize her surgery.  If we are successful, we will plan to proceed with surgery on June 15.  We will continue to communicate with the patient regarding timing of surgery.  Risks and benefits of the surgery, including, but not limited to infection, bleeding, persistent pain, need for further surgery, shoulder stiffness and more severe complications associated with anesthesia were discussed with the patient.  The patient has elected to proceed.    Follow-up: Return for After surgery; DOS June 15, 2021.   Subjective:  Chief Complaint  Patient presents with   Shoulder Pain    RT/review MRI    History of Present Illness: Sara Stuart is a 66 y.o. female who returns to clinic for repeat evaluation of right shoulder pain.  She sustained an injury to her shoulder, almost 1 year ago.  She is work with therapy.  She has tried over-the-counter pain medications, without improvement.  She previously had a humerus MRI, which was nondiagnostic, as she did not tolerate this well.  As a result, we have obtained a right shoulder MRI, using a special unit and medications to allow her to tolerate this procedure.  She continues to have pain in the shoulder.  She is difficulty with overhead motion.  She is interested in surgery  Review of  Systems: No fevers or chills No numbness or tingling No chest pain No shortness of breath No bowel or bladder dysfunction No GI distress No headaches    Objective: There were no vitals taken for this visit.  Physical Exam:  Alert and oriented.  No acute distress.  Right shoulder without deformity.  Difficulty with overhead motion.  Positive drop arm test.  Pain in the empty can testing position.  Positive O'Brien's.  Tenderness to palpation over the anterior shoulder.  4+/5 supraspinatus and infraspinatus strength testing.  Internal rotation to her lumbar spine.  Negative belly press.  IMAGING: I personally ordered and reviewed the following images:  Right shoulder MRI  Impression   high-grade partial and full-thickness tearing of the distal supraspinatus tendon with retraction of up to 2 cm  Infraspinatus tendinosis and mild partial tearing anteriorly  Subscapularis tendinosis without tear  Moderate AC joint arthrosis   Mordecai Rasmussen, MD 05/31/2021 8:48 AM

## 2021-06-01 ENCOUNTER — Telehealth: Payer: Self-pay | Admitting: Pharmacist

## 2021-06-01 NOTE — Telephone Encounter (Signed)
  PHARMD  Follow Up Note   06/01/2021 Name: Sara Stuart MRN: [id number] DOB: February 28, 1955   Medication Management (LIBRE 2 CGM--[employer] [facility] [employer] ([facility] [facility] DME))   LIBRE 2 CGM sent to [facility] [employer]) via [facility] portal Patient can call [phone] to check in on their order Will process as DME (durable medical equipment) under medicare part B If patient does not answer the call-->company WILL NOT ship & order will be cancelled  Patient has traditional glucometer via [facility] that she is currently using Having HYPO and HYPERglycemia--truly needs Libre 2 CGM Awaiting insurance approval and copay information [place] Datter[place] Juno Bozard, PharmD, BCPS Clinical Pharmacist, [place]  [place] Phone [phone]

## 2021-06-02 ENCOUNTER — Telehealth: Payer: Self-pay | Admitting: Family Medicine

## 2021-06-02 NOTE — Telephone Encounter (Signed)
Patient wanted to know about sample.

## 2021-06-09 NOTE — Patient Instructions (Signed)
Sara Stuart  06/09/2021     @   Your procedure is scheduled on  06/15/2021.   Report to [facility] at  0600 A.M.   Call this number if you have problems the morning of surgery:  [phone]   Remember:  Do not eat or drink after midnight.      DO NOT take any medication for diabetes the morning of your procedure.    Bring extra [place] supplies with you when you come. We will have to remove this during surgery because of the metal in it.    Take these medicines the morning of surgery with A SIP OF WATER           baclofen, diltiazem, metoprolol, prilosec.    Do not wear jewelry, make-up or nail polish.  Do not wear lotions, powders, or perfumes, or deodorant.  Do not shave 48 hours prior to surgery.  Men may shave face and neck.  Do not bring valuables to the hospital.  [facility] is not responsible for any belongings or valuables.  Contacts, dentures or bridgework may not be worn into surgery.  Leave your suitcase in the car.  After surgery it may be brought to your room.  For patients admitted to the hospital, discharge time will be determined by your treatment team.  Patients discharged the day of surgery will not be allowed to drive home and must have someone with them for 24 hours.    Special instructions:   DO NOT smoke tobacco or vape for 24 hours before your procedure.  Please read over the following fact sheets that you were given. Coughing and Deep Breathing, Surgical Site Infection Prevention, Anesthesia Post-op Instructions, and Care and Recovery After Surgery      Surgery for Rotator Cuff Tear, Care After This sheet gives you information about how to care for yourself after your procedure. Your health care provider may also give you more specific instructions. If you have problems or questions, contact your health care provider. What can I expect after the procedure? After the procedure, it is common to  have: Redness. Swelling. A small amount of fluid or blood in the incision area. Pain. Stiffness. Follow these instructions at home: If you have a sling or a shoulder immobilizer: Wear it as told by your health care provider. Remove it only as told by your health care provider. Loosen it if your fingers tingle, become numb, or turn cold and blue. Keep it clean and dry. Bathing Do not take baths, swim, or use a hot tub until your health care provider approves. Ask your health care provider if you may take showers. You may only be allowed to take sponge baths. Keep your bandage (dressing) dry until your health care provider says it can be removed. If your sling or shoulder immobilizer is not waterproof: Do not let it get wet. Remove it when you take a bath or shower as told by your health care provider. Once the sling or shoulder immobilizer is removed, try not to move your shoulder until your health care provider says that you can. Incision care  Follow instructions from your health care provider about how to take care of your incision. Make sure you: Wash your hands with soap and water for at least 20 seconds before and after you change your dressing. If soap and water are not available, use hand sanitizer. Change your dressing as told by your health  care provider. Leave stitches (sutures), skin glue, or adhesive strips in place. These skin closures may need to stay in place for 2 weeks or longer. If adhesive strip edges start to loosen and curl up, you may trim the loose edges. Do not remove adhesive strips completely unless your health care provider tells you to do that. Check your incision area every day for signs of infection. Check for: More redness, swelling, or pain. More fluid or blood. Warmth. Pus or a bad smell. Managing pain, stiffness, and swelling  If directed, put ice on your shoulder area. To do this: Put ice in a plastic bag. Place a towel between your skin and the  bag. Leave the ice on for 20 minutes, 2-3 times a day. Remove the ice if your skin turns bright red. This is very important. If you cannot feel pain, heat, or cold, you have a greater risk of damage to the area. Move your fingers often to reduce stiffness and swelling. Raise (elevate) your upper body on pillows when you lie down and when you sleep. Do not sleep on the front of your body (abdomen). Do not sleep on the side that your surgery was performed on. Medicines Take over-the-counter and prescription medicines only as told by your health care provider. Ask your health care provider if the medicine prescribed to you: Requires you to avoid driving or using machinery. Can cause constipation. You may need to take these actions to prevent or treat constipation: Drink enough fluid to keep your urine pale yellow. Take over-the-counter or prescription medicines. Eat foods that are high in fiber, such as beans, whole grains, and fresh fruits and vegetables. Limit foods that are high in fat and processed sugars, such as fried or sweet foods. Driving If you were given a sedative during the procedure, it can affect you for several hours. Do not drive or operate machinery until your health care provider says that it is safe. Do not drive while wearing a sling or a shoulder immobilizer. Ask your health care provider when it is safe to drive. Activity Do not use your arm to support your body weight until your health care provider says that you can. Do not lift or hold anything with your arm until your health care provider approves. Return to your normal activities as told by your health care provider. Ask your health care provider what activities are safe for you. Do exercises as told by your health care provider. General instructions Do not use any products that contain nicotine or tobacco, such as cigarettes, e-cigarettes, and chewing tobacco. These can delay healing after surgery. If you need help  quitting, ask your health care provider. Keep all follow-up visits. This is important. Contact a health care provider if: You have a fever or chills. You have more redness, swelling, or pain around your incision. You have more fluid or blood coming from your incision. Your incision feels warm to the touch. You have pus or a bad smell coming from your incision. You have pain that gets worse or does not get better with medicine. Get help right away if: You have severe pain. You lose feeling in your arm or hand. Your hand or fingers turn very pale or blue. Summary If you have a sling or shoulder immobilizer, wear it as told by your health care provider. Remove it only as told by your health care provider. Change your dressing as told by your health care provider. Check the incision area every day for  signs of infection. If directed, put ice on your shoulder area 2-3 times a day. Do not use your arm to lift anything or to support your body weight until your health care provider says that you can. This information is not intended to replace advice given to you by your health care provider. Make sure you discuss any questions you have with your health care provider. Document Revised: 04/22/2019 Document Reviewed: 04/22/2019 Elsevier Patient Education  2023 [employer] General Anesthesia, Adult, Care After This sheet gives you information about how to care for yourself after your procedure. Your health care provider may also give you more specific instructions. If you have problems or questions, contact your health care provider. What can I expect after the procedure? After the procedure, the following side effects are common: Pain or discomfort at the IV site. Nausea. Vomiting. Sore throat. Trouble concentrating. Feeling cold or chills. Feeling weak or tired. Sleepiness and fatigue. Soreness and body aches. These side effects can affect parts of the body that were not involved in  surgery. Follow these instructions at home: For the time period you were told by your health care provider:  Rest. Do not participate in activities where you could fall or become injured. Do not drive or use machinery. Do not drink alcohol. Do not take sleeping pills or medicines that cause drowsiness. Do not make important decisions or sign legal documents. Do not take care of children on your own. Eating and drinking Follow any instructions from your health care provider about eating or drinking restrictions. When you feel hungry, start by eating small amounts of foods that are soft and easy to digest (bland), such as toast. Gradually return to your regular diet. Drink enough fluid to keep your urine pale yellow. If you vomit, rehydrate by drinking water, juice, or clear broth. General instructions If you have sleep apnea, surgery and certain medicines can increase your risk for breathing problems. Follow instructions from your health care provider about wearing your sleep device: Anytime you are sleeping, including during daytime naps. While taking prescription pain medicines, sleeping medicines, or medicines that make you drowsy. Have a responsible adult stay with you for the time you are told. It is important to have someone help care for you until you are awake and alert. Return to your normal activities as told by your health care provider. Ask your health care provider what activities are safe for you. Take over-the-counter and prescription medicines only as told by your health care provider. If you smoke, do not smoke without supervision. Keep all follow-up visits as told by your health care provider. This is important. Contact a health care provider if: You have nausea or vomiting that does not get better with medicine. You cannot eat or drink without vomiting. You have pain that does not get better with medicine. You are unable to pass urine. You develop a skin rash. You  have a fever. You have redness around your IV site that gets worse. Get help right away if: You have difficulty breathing. You have chest pain. You have blood in your urine or stool, or you vomit blood. Summary After the procedure, it is common to have a sore throat or nausea. It is also common to feel tired. Have a responsible adult stay with you for the time you are told. It is important to have someone help care for you until you are awake and alert. When you feel hungry, start by eating small amounts of foods that  are soft and easy to digest (bland), such as toast. Gradually return to your regular diet. Drink enough fluid to keep your urine pale yellow. Return to your normal activities as told by your health care provider. Ask your health care provider what activities are safe for you. This information is not intended to replace advice given to you by your health care provider. Make sure you discuss any questions you have with your health care provider. Document Revised: 09/03/2019 Document Reviewed: 04/02/2019 Elsevier Patient Education  2023 [employer] How to Use Chlorhexidine for Bathing Chlorhexidine gluconate (CHG) is a germ-killing (antiseptic) solution that is used to clean the skin. It can get rid of the bacteria that normally live on the skin and can keep them away for about 24 hours. To clean your skin with CHG, you may be given: A CHG solution to use in the shower or as part of a sponge bath. A prepackaged cloth that contains CHG. Cleaning your skin with CHG may help lower the risk for infection: While you are staying in the intensive care unit of the hospital. If you have a vascular access, such as a central line, to provide short-term or long-term access to your veins. If you have a catheter to drain urine from your bladder. If you are on a ventilator. A ventilator is a machine that helps you breathe by moving air in and out of your lungs. After surgery. What are the  risks? Risks of using CHG include: A skin reaction. Hearing loss, if CHG gets in your ears and you have a perforated eardrum. Eye injury, if CHG gets in your eyes and is not rinsed out. The CHG product catching fire. Make sure that you avoid smoking and flames after applying CHG to your skin. Do not use CHG: If you have a chlorhexidine allergy or have previously reacted to chlorhexidine. On babies younger than 332 months of age. How to use CHG solution Use CHG only as told by your health care provider, and follow the instructions on the label. Use the full amount of CHG as directed. Usually, this is one bottle. During a shower Follow these steps when using CHG solution during a shower (unless your health care provider gives you different instructions): Start the shower. Use your normal soap and shampoo to wash your face and hair. Turn off the shower or move out of the shower stream. Pour the CHG onto a clean washcloth. Do not use any type of brush or rough-edged sponge. Starting at your neck, lather your body down to your toes. Make sure you follow these instructions: If you will be having surgery, pay special attention to the part of your body where you will be having surgery. Scrub this area for at least 1 minute. Do not use CHG on your head or face. If the solution gets into your ears or eyes, rinse them well with water. Avoid your genital area. Avoid any areas of skin that have broken skin, cuts, or scrapes. Scrub your back and under your arms. Make sure to wash skin folds. Let the lather sit on your skin for 1-2 minutes or as long as told by your health care provider. Thoroughly rinse your entire body in the shower. Make sure that all body creases and crevices are rinsed well. Dry off with a clean towel. Do not put any substances on your body afterward--such as powder, lotion, or perfume--unless you are told to do so by your health care provider. Only use lotions that  are recommended by  the manufacturer. Put on clean clothes or pajamas. If it is the night before your surgery, sleep in clean sheets.  During a sponge bath Follow these steps when using CHG solution during a sponge bath (unless your health care provider gives you different instructions): Use your normal soap and shampoo to wash your face and hair. Pour the CHG onto a clean washcloth. Starting at your neck, lather your body down to your toes. Make sure you follow these instructions: If you will be having surgery, pay special attention to the part of your body where you will be having surgery. Scrub this area for at least 1 minute. Do not use CHG on your head or face. If the solution gets into your ears or eyes, rinse them well with water. Avoid your genital area. Avoid any areas of skin that have broken skin, cuts, or scrapes. Scrub your back and under your arms. Make sure to wash skin folds. Let the lather sit on your skin for 1-2 minutes or as long as told by your health care provider. Using a different clean, wet washcloth, thoroughly rinse your entire body. Make sure that all body creases and crevices are rinsed well. Dry off with a clean towel. Do not put any substances on your body afterward--such as powder, lotion, or perfume--unless you are told to do so by your health care provider. Only use lotions that are recommended by the manufacturer. Put on clean clothes or pajamas. If it is the night before your surgery, sleep in clean sheets. How to use CHG prepackaged cloths Only use CHG cloths as told by your health care provider, and follow the instructions on the label. Use the CHG cloth on clean, dry skin. Do not use the CHG cloth on your head or face unless your health care provider tells you to. When washing with the CHG cloth: Avoid your genital area. Avoid any areas of skin that have broken skin, cuts, or scrapes. Before surgery Follow these steps when using a CHG cloth to clean before surgery  (unless your health care provider gives you different instructions): Using the CHG cloth, vigorously scrub the part of your body where you will be having surgery. Scrub using a back-and-forth motion for 3 minutes. The area on your body should be completely wet with CHG when you are done scrubbing. Do not rinse. Discard the cloth and let the area air-dry. Do not put any substances on the area afterward, such as powder, lotion, or perfume. Put on clean clothes or pajamas. If it is the night before your surgery, sleep in clean sheets.  For general bathing Follow these steps when using CHG cloths for general bathing (unless your health care provider gives you different instructions). Use a separate CHG cloth for each area of your body. Make sure you wash between any folds of skin and between your fingers and toes. Wash your body in the following order, switching to a new cloth after each step: The front of your neck, shoulders, and chest. Both of your arms, under your arms, and your hands. Your stomach and groin area, avoiding the genitals. Your right leg and foot. Your left leg and foot. The back of your neck, your back, and your buttocks. Do not rinse. Discard the cloth and let the area air-dry. Do not put any substances on your body afterward--such as powder, lotion, or perfume--unless you are told to do so by your health care provider. Only use lotions that are recommended  by the manufacturer. Put on clean clothes or pajamas. Contact a health care provider if: Your skin gets irritated after scrubbing. You have questions about using your solution or cloth. You swallow any chlorhexidine. Call your local poison control center ([phone] in the [place] Get help right away if: Your eyes itch badly, or they become very red or swollen. Your skin itches badly and is red or swollen. Your hearing changes. You have trouble seeing. You have swelling or tingling in your mouth or throat. You have  trouble breathing. These symptoms may represent a serious problem that is an emergency. Do not wait to see if the symptoms will go away. Get medical help right away. Call your local emergency services (911 in the [place] Do not drive yourself to the hospital. Summary Chlorhexidine gluconate (CHG) is a germ-killing (antiseptic) solution that is used to clean the skin. Cleaning your skin with CHG may help to lower your risk for infection. You may be given CHG to use for bathing. It may be in a bottle or in a prepackaged cloth to use on your skin. Carefully follow your health care provider's instructions and the instructions on the product label. Do not use CHG if you have a chlorhexidine allergy. Contact your health care provider if your skin gets irritated after scrubbing. This information is not intended to replace advice given to you by your health care provider. Make sure you discuss any questions you have with your health care provider. Document Revised: 02/29/2020 Document Reviewed: 02/29/2020 Elsevier Patient Education  2023 [employer].

## 2021-06-12 ENCOUNTER — Encounter (HOSPITAL_COMMUNITY): Payer: Self-pay

## 2021-06-12 ENCOUNTER — Ambulatory Visit: Payer: Medicare Other | Admitting: Orthopedic Surgery

## 2021-06-12 ENCOUNTER — Encounter (HOSPITAL_COMMUNITY)
Admission: RE | Admit: 2021-06-12 | Discharge: 2021-06-12 | Disposition: A | Discharge status: Home / Self Care | Payer: Medicare Other | Source: Ambulatory Visit | Attending: Orthopedic Surgery | Admitting: Orthopedic Surgery

## 2021-06-12 VITALS — BP 117/74 | HR 80 | Temp 97.8°F | Resp 18 | Ht 61.0 in | Wt 203.0 lb

## 2021-06-12 DIAGNOSIS — I10 Essential (primary) hypertension: Secondary | ICD-10-CM | POA: Diagnosis not present

## 2021-06-12 DIAGNOSIS — Z01810 Encounter for preprocedural cardiovascular examination: Secondary | ICD-10-CM | POA: Insufficient documentation

## 2021-06-13 ENCOUNTER — Ambulatory Visit: Payer: Medicare Other | Admitting: Pharmacist

## 2021-06-13 DIAGNOSIS — E782 Mixed hyperlipidemia: Secondary | ICD-10-CM

## 2021-06-13 DIAGNOSIS — Z794 Long term (current) use of insulin: Secondary | ICD-10-CM

## 2021-06-15 ENCOUNTER — Ambulatory Visit (HOSPITAL_BASED_OUTPATIENT_CLINIC_OR_DEPARTMENT_OTHER): Payer: Medicare Other

## 2021-06-15 ENCOUNTER — Ambulatory Visit (HOSPITAL_COMMUNITY)
Admission: RE | Admit: 2021-06-15 | Discharge: 2021-06-15 | Disposition: A | Discharge status: Home / Self Care | Payer: Medicare Other | Attending: Orthopedic Surgery | Admitting: Orthopedic Surgery

## 2021-06-15 ENCOUNTER — Encounter: Payer: Self-pay | Admitting: Orthopedic Surgery

## 2021-06-15 ENCOUNTER — Encounter (HOSPITAL_COMMUNITY): Payer: Self-pay | Admitting: Orthopedic Surgery

## 2021-06-15 ENCOUNTER — Ambulatory Visit (HOSPITAL_COMMUNITY): Payer: Medicare Other

## 2021-06-15 ENCOUNTER — Other Ambulatory Visit: Payer: Self-pay

## 2021-06-15 ENCOUNTER — Encounter (HOSPITAL_COMMUNITY)
Admission: RE | Disposition: A | Discharge status: Home / Self Care | Payer: Self-pay | Source: Home / Self Care | Attending: Orthopedic Surgery

## 2021-06-15 DIAGNOSIS — M75121 Complete rotator cuff tear or rupture of right shoulder, not specified as traumatic: Secondary | ICD-10-CM | POA: Insufficient documentation

## 2021-06-15 DIAGNOSIS — Z794 Long term (current) use of insulin: Secondary | ICD-10-CM

## 2021-06-15 DIAGNOSIS — S46011A Strain of muscle(s) and tendon(s) of the rotator cuff of right shoulder, initial encounter: Secondary | ICD-10-CM | POA: Diagnosis not present

## 2021-06-15 DIAGNOSIS — Z7984 Long term (current) use of oral hypoglycemic drugs: Secondary | ICD-10-CM

## 2021-06-15 DIAGNOSIS — S46011D Strain of muscle(s) and tendon(s) of the rotator cuff of right shoulder, subsequent encounter: Secondary | ICD-10-CM

## 2021-06-15 DIAGNOSIS — M659 Synovitis and tenosynovitis, unspecified: Secondary | ICD-10-CM | POA: Insufficient documentation

## 2021-06-15 DIAGNOSIS — K219 Gastro-esophageal reflux disease without esophagitis: Secondary | ICD-10-CM | POA: Diagnosis not present

## 2021-06-15 DIAGNOSIS — E1165 Type 2 diabetes mellitus with hyperglycemia: Secondary | ICD-10-CM

## 2021-06-15 DIAGNOSIS — G8918 Other acute postprocedural pain: Secondary | ICD-10-CM | POA: Diagnosis not present

## 2021-06-15 DIAGNOSIS — E119 Type 2 diabetes mellitus without complications: Secondary | ICD-10-CM | POA: Diagnosis not present

## 2021-06-15 DIAGNOSIS — I10 Essential (primary) hypertension: Secondary | ICD-10-CM

## 2021-06-15 HISTORY — PX: SHOULDER ARTHROSCOPY WITH ROTATOR CUFF REPAIR: SHX5685

## 2021-06-15 LAB — GLUCOSE, CAPILLARY
Glucose-Capillary: 245 mg/dL — ABNORMAL HIGH (ref 70–99)
Glucose-Capillary: 260 mg/dL — ABNORMAL HIGH (ref 70–99)

## 2021-06-15 SURGERY — ARTHROSCOPY, SHOULDER, WITH ROTATOR CUFF REPAIR
Anesthesia: General | Site: Shoulder | Laterality: Right

## 2021-06-15 MED ORDER — CHLORHEXIDINE GLUCONATE 0.12 % MT SOLN
15.0000 mL | Freq: Once | OROMUCOSAL | Status: AC
  Administered 2021-06-15: 15 mL via OROMUCOSAL

## 2021-06-15 MED ORDER — CEFAZOLIN SODIUM-DEXTROSE 2-4 GM/100ML-% IV SOLN
INTRAVENOUS | Status: AC
  Filled 2021-06-15: qty 100

## 2021-06-15 MED ORDER — FENTANYL CITRATE (PF) 100 MCG/2ML IJ SOLN
INTRAMUSCULAR | Status: AC
  Filled 2021-06-15: qty 2

## 2021-06-15 MED ORDER — PHENYLEPHRINE HCL-NACL 20-0.9 MG/250ML-% IV SOLN
INTRAVENOUS | Status: DC | PRN
  Administered 2021-06-15: 30 ug/min via INTRAVENOUS

## 2021-06-15 MED ORDER — CEFAZOLIN SODIUM-DEXTROSE 2-4 GM/100ML-% IV SOLN
2.0000 g | INTRAVENOUS | Status: AC
  Administered 2021-06-15: 2 g via INTRAVENOUS

## 2021-06-15 MED ORDER — DEXAMETHASONE SODIUM PHOSPHATE 10 MG/ML IJ SOLN
INTRAMUSCULAR | Status: AC
  Filled 2021-06-15: qty 1

## 2021-06-15 MED ORDER — EPINEPHRINE PF 1 MG/ML IJ SOLN
INTRAMUSCULAR | Status: AC
  Filled 2021-06-15: qty 5

## 2021-06-15 MED ORDER — SODIUM CHLORIDE 0.9 % IR SOLN: Status: DC | PRN

## 2021-06-15 MED ORDER — HYDROMORPHONE HCL 1 MG/ML IJ SOLN
0.5000 mg | INTRAMUSCULAR | Status: DC | PRN
  Administered 2021-06-15 (×2): 0.5 mg via INTRAVENOUS
  Filled 2021-06-15: qty 0.5

## 2021-06-15 MED ORDER — MIDAZOLAM HCL 5 MG/5ML IJ SOLN
INTRAMUSCULAR | Status: DC | PRN
  Administered 2021-06-15: 2 mg via INTRAVENOUS

## 2021-06-15 MED ORDER — ROPIVACAINE HCL 5 MG/ML IJ SOLN: INTRAMUSCULAR | Status: DC | PRN

## 2021-06-15 MED ORDER — PHENYLEPHRINE HCL-NACL 20-0.9 MG/250ML-% IV SOLN
INTRAVENOUS | Status: AC
  Filled 2021-06-15: qty 500

## 2021-06-15 MED ORDER — ONDANSETRON HCL 4 MG PO TABS: 4.0000 mg | ORAL_TABLET | Freq: Three times a day (TID) | ORAL | 0 refills | Status: AC | PRN

## 2021-06-15 MED ORDER — PHENYLEPHRINE 80 MCG/ML (10ML) SYRINGE FOR IV PUSH (FOR BLOOD PRESSURE SUPPORT)
PREFILLED_SYRINGE | INTRAVENOUS | Status: AC
  Filled 2021-06-15: qty 10

## 2021-06-15 MED ORDER — SODIUM CHLORIDE 0.9 % IR SOLN
Status: DC | PRN
  Administered 2021-06-15 (×6): 3000 mL

## 2021-06-15 MED ORDER — ROCURONIUM BROMIDE 10 MG/ML (PF) SYRINGE
PREFILLED_SYRINGE | INTRAVENOUS | Status: DC | PRN
  Administered 2021-06-15: 20 mg via INTRAVENOUS
  Administered 2021-06-15: 60 mg via INTRAVENOUS

## 2021-06-15 MED ORDER — PROPOFOL 10 MG/ML IV BOLUS
INTRAVENOUS | Status: AC
  Filled 2021-06-15: qty 20

## 2021-06-15 MED ORDER — MIDAZOLAM HCL 2 MG/2ML IJ SOLN
INTRAMUSCULAR | Status: AC
  Filled 2021-06-15: qty 2

## 2021-06-15 MED ORDER — ONDANSETRON HCL 4 MG/2ML IJ SOLN
INTRAMUSCULAR | Status: AC
  Filled 2021-06-15: qty 2

## 2021-06-15 MED ORDER — ORAL CARE MOUTH RINSE: 15.0000 mL | Freq: Once | OROMUCOSAL | Status: AC

## 2021-06-15 MED ORDER — ROCURONIUM BROMIDE 10 MG/ML (PF) SYRINGE
PREFILLED_SYRINGE | INTRAVENOUS | Status: AC
  Filled 2021-06-15: qty 10

## 2021-06-15 MED ORDER — ACETAMINOPHEN 500 MG PO TABS
500.0000 mg | ORAL_TABLET | Freq: Once | ORAL | Status: AC
  Administered 2021-06-15: 500 mg via ORAL
  Filled 2021-06-15: qty 1

## 2021-06-15 MED ORDER — ONDANSETRON HCL 4 MG/2ML IJ SOLN
INTRAMUSCULAR | Status: DC | PRN
  Administered 2021-06-15: 4 mg via INTRAVENOUS

## 2021-06-15 MED ORDER — LIDOCAINE HCL (PF) 2 % IJ SOLN
INTRAMUSCULAR | Status: AC
  Filled 2021-06-15: qty 5

## 2021-06-15 MED ORDER — SUGAMMADEX SODIUM 200 MG/2ML IV SOLN
INTRAVENOUS | Status: DC | PRN
  Administered 2021-06-15: 200 mg via INTRAVENOUS

## 2021-06-15 MED ORDER — OXYCODONE HCL 5 MG PO TABS
5.0000 mg | ORAL_TABLET | Freq: Once | ORAL | Status: AC
  Administered 2021-06-15: 5 mg via ORAL
  Filled 2021-06-15: qty 1

## 2021-06-15 MED ORDER — SEVOFLURANE IN SOLN
RESPIRATORY_TRACT | Status: AC
  Filled 2021-06-15: qty 250

## 2021-06-15 MED ORDER — OXYCODONE HCL 5 MG PO TABS: 5.0000 mg | ORAL_TABLET | ORAL | 0 refills | Status: AC | PRN

## 2021-06-15 MED ORDER — FENTANYL CITRATE (PF) 100 MCG/2ML IJ SOLN
INTRAMUSCULAR | Status: DC | PRN
Start: 2021-06-15 — End: 2021-06-15
  Administered 2021-06-15 (×2): 50 ug via INTRAVENOUS

## 2021-06-15 MED ORDER — ROPIVACAINE HCL 5 MG/ML IJ SOLN
INTRAMUSCULAR | Status: DC | PRN
  Administered 2021-06-15: 20 mL via PERINEURAL

## 2021-06-15 MED ORDER — LACTATED RINGERS IV SOLN: INTRAVENOUS | Status: DC

## 2021-06-15 MED ORDER — PROPOFOL 10 MG/ML IV BOLUS
INTRAVENOUS | Status: DC | PRN
  Administered 2021-06-15: 150 mg via INTRAVENOUS

## 2021-06-15 MED ORDER — ROPIVACAINE HCL 5 MG/ML IJ SOLN
INTRAMUSCULAR | Status: AC
  Filled 2021-06-15: qty 30

## 2021-06-15 MED ORDER — DEXAMETHASONE SODIUM PHOSPHATE 4 MG/ML IJ SOLN
INTRAMUSCULAR | Status: DC | PRN
  Administered 2021-06-15: 5 mg via INTRAVENOUS

## 2021-06-15 MED ORDER — BUPIVACAINE HCL (PF) 0.5 % IJ SOLN
INTRAMUSCULAR | Status: AC
  Filled 2021-06-15: qty 30

## 2021-06-15 MED ORDER — ACETAMINOPHEN 500 MG PO TABS: 1000.0000 mg | ORAL_TABLET | Freq: Three times a day (TID) | ORAL | 0 refills | Status: AC

## 2021-06-15 MED ORDER — HYDROMORPHONE HCL 1 MG/ML IJ SOLN
INTRAMUSCULAR | Status: AC
  Filled 2021-06-15: qty 0.5

## 2021-06-15 MED ORDER — EPINEPHRINE PF 1 MG/ML IJ SOLN
INTRAMUSCULAR | Status: AC
  Filled 2021-06-15: qty 3

## 2021-06-15 SURGICAL SUPPLY — 65 items
APL PRP STRL LF DISP 70% ISPRP (MISCELLANEOUS) ×1
BLADE SURG SZ11 CARB STEEL (BLADE) ×2 IMPLANT
BNDG GAUZE ELAST 4 BULKY (GAUZE/BANDAGES/DRESSINGS) ×4 IMPLANT
CANNULA TWIST IN 8.25X7CM (CANNULA) ×1 IMPLANT
CHLORAPREP W/TINT 26 (MISCELLANEOUS) ×2 IMPLANT
CLOTH BEACON ORANGE TIMEOUT ST (SAFETY) ×2 IMPLANT
COOLER GRAV CUFF SHDR 32X48 (ORTHOPEDIC SUPPLIES) ×2 IMPLANT
COOLER ICEMAN CLASSIC (MISCELLANEOUS) ×2 IMPLANT
COVER LIGHT HANDLE STERIS (MISCELLANEOUS) ×4 IMPLANT
CUTTER BONE 4.0MM X 13CM (MISCELLANEOUS) ×3 IMPLANT
DRAPE HALF SHEET 40X57 (DRAPES) ×1 IMPLANT
DRAPE SHOULDER BEACH CHAIR (DRAPES) ×2 IMPLANT
ELECT REM PT RETURN 9FT ADLT (ELECTROSURGICAL) ×2
ELECTRODE REM PT RTRN 9FT ADLT (ELECTROSURGICAL) ×1 IMPLANT
GAUZE 4X4 16PLY ~~LOC~~+RFID DBL (SPONGE) ×2 IMPLANT
GAUZE SPONGE 4X4 12PLY STRL (GAUZE/BANDAGES/DRESSINGS) ×2 IMPLANT
GAUZE XEROFORM 1X8 LF (GAUZE/BANDAGES/DRESSINGS) ×2 IMPLANT
GLOVE BIO SURGEON STRL SZ 6.5 (GLOVE) ×1 IMPLANT
GLOVE BIO SURGEON STRL SZ7 (GLOVE) ×1 IMPLANT
GLOVE BIOGEL PI IND STRL 7.0 (GLOVE) ×2 IMPLANT
GLOVE BIOGEL PI IND STRL 8 (GLOVE) IMPLANT
GLOVE BIOGEL PI IND STRL 8.5 (GLOVE) IMPLANT
GLOVE BIOGEL PI INDICATOR 7.0 (GLOVE) ×2
GLOVE BIOGEL PI INDICATOR 8 (GLOVE) ×1
GLOVE BIOGEL PI INDICATOR 8.5 (GLOVE) ×1
GLOVE SKINSENSE NS SZ8.0 LF (GLOVE) ×1
GLOVE SKINSENSE STRL SZ8.0 LF (GLOVE) IMPLANT
GLOVE SRG 8 PF TXTR STRL LF DI (GLOVE) ×1 IMPLANT
GLOVE SURG POLYISO LF SZ8 (GLOVE) ×6 IMPLANT
GLOVE SURG SS PI 8.0 STRL IVOR (GLOVE) ×2 IMPLANT
GLOVE SURG UNDER POLY LF SZ8 (GLOVE) ×2
GOWN STRL REUS W/ TWL XL LVL3 (GOWN DISPOSABLE) ×1 IMPLANT
GOWN STRL REUS W/TWL LRG LVL3 (GOWN DISPOSABLE) ×4 IMPLANT
GOWN STRL REUS W/TWL XL LVL3 (GOWN DISPOSABLE) ×4
IMPL SPEEDBRIDGE KIT (Orthopedic Implant) IMPLANT
IMPLANT SPEEDBRIDGE KIT (Orthopedic Implant) ×2 IMPLANT
INST SET MINOR BONE (KITS) ×2 IMPLANT
IV NS IRRIG 3000ML ARTHROMATIC (IV SOLUTION) ×8 IMPLANT
KIT BLADEGUARD II DBL (SET/KITS/TRAYS/PACK) ×2 IMPLANT
KIT POSITION SHOULDER SCHLEI (MISCELLANEOUS) ×2 IMPLANT
KIT TURNOVER KIT A (KITS) ×2 IMPLANT
MANIFOLD NEPTUNE II (INSTRUMENTS) ×2 IMPLANT
MARKER SKIN DUAL TIP RULER LAB (MISCELLANEOUS) ×2 IMPLANT
NDL HYPO 18GX1.5 BLUNT FILL (NEEDLE) IMPLANT
NDL HYPO 21X1.5 SAFETY (NEEDLE) IMPLANT
NDL SPNL 18GX3.5 QUINCKE PK (NEEDLE) ×1 IMPLANT
NEEDLE HYPO 18GX1.5 BLUNT FILL (NEEDLE) ×2 IMPLANT
NEEDLE HYPO 21X1.5 SAFETY (NEEDLE) ×2 IMPLANT
NEEDLE SPNL 18GX3.5 QUINCKE PK (NEEDLE) ×2 IMPLANT
NS IRRIG 1000ML POUR BTL (IV SOLUTION) ×2 IMPLANT
PACK TOTAL JOINT (CUSTOM PROCEDURE TRAY) ×2 IMPLANT
PAD ABD 5X9 TENDERSORB (GAUZE/BANDAGES/DRESSINGS) ×4 IMPLANT
PAD ARMBOARD 7.5X6 YLW CONV (MISCELLANEOUS) ×2 IMPLANT
PAD COLD SHLDR WRAP-ON (PAD) ×1 IMPLANT
PORT APPOLLO RF 90DEGREE MULTI (SURGICAL WAND) ×2 IMPLANT
SET ARTHROSCOPY INST (INSTRUMENTS) ×2 IMPLANT
SET BASIN LINEN APH (SET/KITS/TRAYS/PACK) ×2 IMPLANT
SET SHOULDER TRAC (MISCELLANEOUS) ×1 IMPLANT
SET SHOULDER TRACTION (MISCELLANEOUS) ×2
SLING ULTRA II L (ORTHOPEDIC SUPPLIES) ×1 IMPLANT
SUT ETHILON 3 0 FSL (SUTURE) ×1 IMPLANT
SYR BULB IRRIG 60ML STRL (SYRINGE) ×2 IMPLANT
TOWEL OR 17X26 4PK STRL BLUE (TOWEL DISPOSABLE) ×1 IMPLANT
TUBING IN/OUT FLOW W/MAIN PUMP (TUBING) ×2 IMPLANT
YANKAUER SUCT 12FT TUBE ARGYLE (SUCTIONS) ×2 IMPLANT

## 2021-06-15 NOTE — Progress Notes (Signed)
Chronic Care Management Pharmacy Note  06/13/2021 Name:  Sara Stuart MRN:  [id number] DOB:  [birth year]  Summary:  Diabetes: Goal on Track (progressing): YES. "Uncontrolled"- A1C 7.8-->7.0%,  A1C practically at goal for patient, however still having HYPERglycemia and HYPOglycemia glucose has improved a bit; backing down on insulin due to recent overlap of mix insulin;  Current treatment: Tresiba 20-25 units AT BEDTIME, Humulin 70/30 mix pen (10-30 units with each meal), Onglyza 50m, Farxiga 18m metformin; Humulin 70/30 mix pen is familiar to her since her husband is also on--we have tried x2 to transition her over to basal/bolus, but this has been unsuccessful.  Patient resorts back to previous regimen if she begins to run high (concern for hypoglycemia/overlap with mix+basal+other meds) Noted intolerance to Trulicity (may consider Rybelsus, low dose ozempic, mounjaro to decrease insulin requirements) Denies personal and family history of Medullary thyroid cancer (MTC) Continue Tresiba 20-25 units  Continue mix insulin 70/30 to 10-30 units --patient doing 2-3 times daily (COUNSELED PATIENT TO BACK DOWN IN MEAL TIME INSULIN WHEN EATING LESS--THIS IS USUALLY AT LUNCH TIME (only do 10units); DO NOT WAIT MORE THE 4 HOURS TO EAT DINNER; USE LIBRE TO GUIDE YOUR TREATMENT AND FOOD DECISIONS) Would like to have patient on rapid acting insulin only (not mix)--this has not worked out x2Games developerbout hypo events-->patient denies hypoglycemia in the past 2 weeks; counseled on administration of insulin and eating 3 steady meals daily (she was waiting to late to eat dinner and going low) Working to get liEnergy East CorporationGM 2 (cost is $35/month) Continue ongylza (free via az&me patient assistance program) Continue farxiga (free via az&me patient assistance program) Continue metformin Current glucose readings: fasting glucose: <200, post prandial glucose: 200s Reports hypoglycemia has improved/no events x2 weeks The  patient is asked to make an attempt to improve diet and exercise patterns to aid in medical management of this problem. Discussed meal planning options and Plate method for healthy eating Avoid sugary drinks and desserts Incorporate balanced protein, non starchy veggies, 1 serving of carbohydrate with each meal Increase water intake Increase physical activity as able Current exercise: n/a-->encouraged Recommended heart healthy diet, healthy plate method handout given; work on transition DM regimen Assessed patient finances. Patient approved for Az&me patient assistance for Farxiga and Onglyza (sub for januvia-easier to get via patient assistance)--patient approved until 12/31/21, Humalog mix insulin 70/30--free via [employer]illy [employer] patient assistance  Subjective: Sara Teagardens an [age].o. year old female who is a primary patient of JoLoman BrooklynFNP.  The CCM team was consulted for assistance with disease management and care coordination needs.    Engaged with patient by telephone for follow up visit in response to provider referral for pharmacy case management and/or care coordination services.   Consent to Services:  The patient was given information about Chronic Care Management services, agreed to services, and gave verbal consent prior to initiation of services.  Please see initial visit note for detailed documentation.   Patient Care Team: JoLoman BrooklynFNP as PCP - General (Family Medicine) PrLavera GuiseRPPromise [facility] Pharmacist (Family Medicine) JoCelestia KhatODGeorgiaOptometry)  Objective:  Lab Results  Component Value Date   CREATININE 0.94 05/10/2021   CREATININE 0.87 02/09/2021   CREATININE 0.96 11/01/2020    Lab Results  Component Value Date   HGBA1C 7.0 (H) 05/10/2021   Last diabetic Eye exam:  Lab Results  Component Value Date/Time   HMDIABEYEEXA No Retinopathy 06/23/2020 12:00 AM    Last  diabetic Foot exam: No results found for: "HMDIABFOOTEX"      Component  Value Date/Time   CHOL 146 05/10/2021 0857   TRIG 122 05/10/2021 0857   HDL 57 05/10/2021 0857   CHOLHDL 2.6 05/10/2021 0857   CHOLHDL 3.8 05/02/2017 1617   LDLCALC 67 05/10/2021 0857   LDLCALC 111 (H) 05/02/2017 1617       Latest Ref Rng & Units 05/10/2021    8:57 AM 02/09/2021    2:36 PM 11/01/2020   10:22 AM  Hepatic Function  Total Protein 6.0 - 8.5 g/dL 6.9  7.3  7.0   Albumin 3.8 - 4.8 g/dL 4.4  4.5  4.2   AST 0 - 40 IU/L '19  20  17   ' ALT 0 - 32 IU/L '14  17  15   ' Alk Phosphatase 44 - 121 IU/L 79  82  80   Total Bilirubin 0.0 - 1.2 mg/dL 0.2  0.2  0.3     Lab Results  Component Value Date/Time   TSH 1.41 05/02/2017 04:17 PM   FREET4 1.1 05/02/2017 04:17 PM       Latest Ref Rng & Units 05/10/2021    8:57 AM 02/09/2021    2:36 PM 11/01/2020   10:22 AM  CBC  WBC 3.4 - 10.8 x10E3/uL 7.8  11.7  9.0   Hemoglobin 11.1 - 15.9 g/dL 13.7  14.1  13.3   Hematocrit 34.0 - 46.6 % 41.2  43.2  42.3   Platelets 150 - 450 x10E3/uL 286  314  306     Lab Results  Component Value Date/Time   VD25OH 22 (L) 05/02/2017 04:17 PM    Clinical ASCVD: No  The 10-year ASCVD risk score (Arnett DK, et al., 2019) is: 9.2%   Values used to calculate the score:     Age: 62 years     Sex: Female     Is Non-Hispanic African American: No     Diabetic: Yes     Tobacco smoker: No     Systolic Blood Pressure: 349 mmHg     Is BP treated: No     HDL Cholesterol: 57 mg/dL     Total Cholesterol: 146 mg/dL    Other: (CHADS2VASc if Afib, PHQ9 if depression, MMRC or CAT for COPD, ACT, DEXA)  Social History   Tobacco Use  Smoking Status Never  Smokeless Tobacco Never   BP Readings from Last 3 Encounters:  06/15/21 131/69  06/12/21 117/74  05/10/21 112/76   Pulse Readings from Last 3 Encounters:  06/15/21 67  06/12/21 80  05/10/21 64   Wt Readings from Last 3 Encounters:  06/15/21 203 lb 0.7 oz (92.1 kg)  06/12/21 203 lb (92.1 kg)  05/10/21 208 lb (94.3 kg)    Assessment: Review of  patient past medical history, allergies, medications, health status, including review of consultants reports, laboratory and other test data, was performed as part of comprehensive evaluation and provision of chronic care management services.   SDOH:  (Social Determinants of Health) assessments and interventions performed:    CCM Care Plan  Allergies  Allergen Reactions   Trulicity [Dulaglutide] Other (See Comments)    GI intolerance    Medications Reviewed Today     Reviewed by Ollen Bowl, CRNA (Certified Registered Nurse Anesthetist) on 06/15/21 at [phone]  Med List Status: [place] - Patient bringing meds/list   Medication Order Taking? Sig Documenting Provider Last Dose Status Informant  aspirin EC 81 MG tablet  [id number] Yes Take 1 tablet (81 mg total) by mouth daily. Orson Eva, MD 06/14/2021 Active Self           Med Note [facility] A   Thu Mar 14, 2017  2:26 PM)    baclofen (LIORESAL) 10 MG tablet [id number] Yes Take 1 tablet (10 mg total) by mouth 3 (three) times daily as needed. Hendricks Limes F, FNP 06/15/2021 Active   Continuous Blood Gluc Receiver (FREESTYLE LIBRE 2 READER) DEVI [id number]  Use to test blood sugar continuously. DX: E11.65 Hendricks Limes F, FNP  Active            Med Note Blanca Friend, Kathreen Dileo D   Thu Jun 01, 2021 11:06 AM) SENT TO [facility] DI[employer]ET[facility] SUPPLY VIA [place] PORTAL  Continuous Blood Gluc Sensor (FREESTYLE LIBRE 2 SENSOR) MISC [id number]  Use to test blood sugar continuously.  Replace sensor every 14 days. DX: E11.65 Hendricks Limes F, FNP  Active            Med Note Blanca Friend, Kylan Veach D   Thu Jun 01, 2021 11:07 AM) Durene Cal TO [employer] DIABET[employer] SUPPL[employer] VIA [place] PORTAL   dapagliflozin propanediol (FARXIGA) 10 MG TABS tablet [id number] Yes Take 1 tablet (10 mg total) by mouth daily before breakfast. Loman Brooklyn, FNP 06/14/2021 Active            Med Note Blanca Friend, Carlee Tesfaye D   Wed May 31, 2021  4:13 PM) Via AZ&me patient assistance program   diltiazem  (DILT-XR) 120 MG 24 hr capsule [id number] Yes Take 1 capsule (120 mg total) by mouth daily. Loman Brooklyn, FNP 06/15/2021 Active   fluticasone (FLONASE) 50 MCG/ACT nasal spray [id number]  Place 2 sprays into both nostrils daily as needed. [provider]  Active   glucose blood test strip [id number]  Test BS four times daily Dx E11.65 Ronnie Doss M, DO  Active   ibuprofen (ADVIL) 800 MG tablet [id number] Yes TAKE 1 TABLET BY MOUTH EVERY 8 HOURS AS NEEDED FOR MODERATE PAIN Loman Brooklyn, FNP 06/14/2021 Active   insulin degludec (TRESIBA FLEXTOUCH) 100 UNIT/ML FlexTouch Pen [id number] Yes Inject 26 Units into the skin daily.  Patient taking differently: Inject 20-26 Units into the skin daily.   Hendricks Limes F, FNP 06/14/2021 Active            Med Note Anne Fu, PAMELA W   Thu Jun 15, 2021  6:38 AM) Evelina Bucy only 1/2 dose 15 units   insulin isophane & regular human KwikPen (HUMULIN 70/30 KWIKPEN) (70-30) 100 UNIT/ML KwikPen [id number]  Inject 35 Units into the skin with breakfast, with lunch, and with evening meal.  Patient taking differently: Inject 20-35 Units into the skin with breakfast, with lunch, and with evening meal.   Hendricks Limes F, FNP  Active   Insulin Pen Needle (B-D ULTRAFINE III SHORT PEN) 31G X 8 MM MISC [id number]  1 each by Does not apply route as directed. Cassandria Anger, MD  Active Self  lisinopril (ZESTRIL) 2.5 MG tablet [id number] Yes Take 1 tablet (2.5 mg total) by mouth daily. Loman Brooklyn, FNP 06/14/2021 Active   metFORMIN (GLUCOPHAGE-XR) 500 MG 24 hr tablet [id number] Yes Take 2 tablets (1,000 mg total) by mouth 2 (two) times daily. Loman Brooklyn, FNP 06/14/2021 Active   metoprolol tartrate (LOPRESSOR) 25 MG tablet [id number] Yes Take 1 tablet (25 mg total) by mouth daily. Loman Brooklyn, FNP 06/15/2021 Active   nitroGLYCERIN (NITROSTAT) 0.4 MG SL tablet [id number] No DISSOLVE ONE  TABLET UNDER THE TONGUE EVERY 5 MINUTES AS NEEDED FOR CHEST PAIN.  DO NOT  EXCEED A TOTAL OF 3 DOSES IN 15 MINUTES Fay Records, MD Unknown Active   omeprazole (PRILOSEC) 40 MG capsule [id number] Yes Take 1 capsule by mouth once daily Loman Brooklyn, FNP 06/15/2021 Active   rosuvastatin (CRESTOR) 10 MG tablet [id number]  Take 1 tablet by mouth once daily Evelina Dun A, FNP  Active   saxagliptin HCl (ONGLYZA) 5 MG TABS tablet [id number]  Take 1 tablet (5 mg total) by mouth daily. Loman Brooklyn, FNP  Active            Med Note Parthenia Ames Jun 01, 2021 11:07 AM) Via AZ&me patient assistance program   spironolactone (ALDACTONE) 25 MG tablet [id number] Yes Take 1/2 (one-half) tablet by mouth once daily Loman Brooklyn, FNP 06/14/2021 Active   traZODone (DESYREL) 50 MG tablet [id number] Yes TAKE 1 TABLET BY MOUTH AT BEDTIME Loman Brooklyn, FNP Past Week Active   triamterene-hydrochlorothiazide (DYAZIDE) 37.5-25 MG capsule [id number] Yes Take 1 each (1 capsule total) by mouth daily. Loman Brooklyn, FNP 06/14/2021 Active   Med List Note Lavera Guise [facility] 06/01/21 1127): Prescription assistance for Onglyza and Farxiga--ESCRIBE TO [place] (AZ&ME PATIENT ASSISTANCE) Prescription assistance for HUMALOG KWIKPEN 70/30--ESCRIBE TO [facility] [place] ([employer] patient assistance)             Patient Active Problem List   Diagnosis Date Noted   Aortic atherosclerosis ([place]) 11/01/2020   Pain of right upper extremity 06/28/2020   Insomnia 12/18/2019   Coronary artery disease involving native coronary artery of native heart without angina pectoris 02/03/2019   Mixed hyperlipidemia 10/10/2017   Uncontrolled type 2 diabetes mellitus with hyperglycemia, with long-term current use of insulin ([place]) 03/14/2017   Essential hypertension, benign 03/14/2017   GERD (gastroesophageal reflux disease) 12/29/2012   History of gout 12/29/2012   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   Morbid obesity ([place]) 12/29/2012   Degenerative  spondylolisthesis 07/08/2012   Spinal stenosis, lumbar region, with neurogenic claudication 07/08/2012    Immunization History  Administered Date(s) Administered   Influenza, High Dose Seasonal PF 10/22/2020   Influenza, Quadrivalent, Recombinant, Inj, Pf 09/18/2018   Influenza,inj,Quad PF,6+ Mos 09/21/2017   Influenza,inj,quad, With Preservative 01/05/2017, 09/21/2017   Influenza,trivalent, recombinat, inj, PF 10/02/2014   Influenza-Unspecified 09/18/2018, 10/02/2019, 10/27/2020   Moderna Sars-Covid-2 Vaccination 11/03/2019, 12/03/2019, 06/16/2020   PNEUMOCOCCAL CONJUGATE-20 11/01/2020   Pneumococcal Polysaccharide-23 09/10/2017   Tdap 01/07/2018   Zoster Recombinat (Shingrix) 02/09/2021    Conditions to be addressed/monitored: HLD and DMII  Care Plan : PHARMD MEDICATION MANAGEMENT  Updates made by Lavera Guise, RPH since 06/15/2021 12:00 AM     Problem: DISEASE PROGRESSION PREVENTION      Long-Range Goal: T2DM, HLD, HTN PHARMD GOAL   Recent Progress: Not on track  Priority: High  Note:   Current Barriers:  Unable to independently afford treatment regimen Unable to achieve control of T2DM  Suboptimal therapeutic regimen for T2DM  Pharmacist Clinical Goal(s):  patient will verbalize ability to afford treatment regimen achieve control of T2DM, HLD as evidenced by GOAL A1C, IMPROVED QUALITY OF LIFE, LDL<70 maintain control of T2DM, HLD as evidenced by GOAL A1C, IMPROVED QUALITY OF LIFE, LDL<70  adhere to plan to optimize therapeutic regimen for T2DM, HLD as evidenced by report of adherence to recommended medication management changes through collaboration with PharmD and provider.  Interventions: 1:1 collaboration with Loman Brooklyn, FNP regarding development and update of comprehensive plan of care as evidenced by provider attestation and co-signature Inter-disciplinary care team collaboration (see longitudinal plan of care) Comprehensive medication review  performed; medication list updated in electronic medical record  Diabetes: Goal on Track (progressing): YES. "Uncontrolled"- A1C 7.8-->7.0%,  A1C practically at goal for patient, however still having HYPERglycemia and HYPOglycemia glucose has improved a bit; backing down on insulin due to recent overlap of mix insulin;  Current treatment: Tresiba 20-25 units AT BEDTIME, Humulin 70/30 mix pen (10-30 units with each meal), Onglyza 56m, Farxiga 150m metformin; Humulin 70/30 mix pen is familiar to her since her husband is also on--we have tried x2 to transition her over to basal/bolus, but this has been unsuccessful.  Patient resorts back to previous regimen if she begins to run high (concern for hypoglycemia/overlap with mix+basal+other meds) Noted intolerance to Trulicity (may consider Rybelsus, low dose ozempic, mounjaro to decrease insulin requirements) Denies personal and family history of Medullary thyroid cancer (MTC) Continue Tresiba 20-25 units  Continue mix insulin 70/30 to 10-30 units --patient doing 2-3 times daily (COUNSELED PATIENT TO BACK DOWN IN MEAL TIME INSULIN WHEN EATING LESS--THIS IS USUALLY AT LUNCH TIME (only do 10units); DO NOT WAIT MORE THE 4 HOURS TO EAT DINNER; USE LIBRE TO GUIDE YOUR TREATMENT AND FOOD DECISIONS) Would like to have patient on rapid acting insulin only (not mix)--this has not worked out x2[employer] developerbout hypo events-->patient denies hypoglycemia in the past 2 weeks; counseled on administration of insulin and eating 3 steady meals daily (she was waiting to late to eat dinner and going low) Working to get liEnergy [employer] CorporationGM 2 (cost is $35/month) Continue ongylza (free via az&me patient assistance program) Continue farxiga (free via az&me patient assistance program) Continue metformin Current glucose readings: fasting glucose: <200, post prandial glucose: 200s Reports hypoglycemia has improved/no events x2 weeks The patient is asked to make an attempt to improve diet  and exercise patterns to aid in medical management of this problem. Discussed meal planning options and Plate method for healthy eating Avoid sugary drinks and desserts Incorporate balanced protein, non starchy veggies, 1 serving of carbohydrate with each meal Increase water intake Increase physical activity as able Current exercise: n/a-->encouraged Recommended heart healthy diet, healthy plate method handout given; work on transition DM regimen Assessed patient finances. Patient approved for Az&me patient assistance for Farxiga and Onglyza (sub for januvia-easier to get via patient assistance)--patient approved until 12/31/21, Humalog mix insulin 70/30--free via [employer] patient assistance  Hyperlipidemia:  Goal on Track (progressing): YES. Uncontrolled-LDL 78 current treatment: rosuvastatin 1057maily;  Medications previously tried: n/a  Current dietary patterns: heart healthy diet, healthy plate method handout given Counseled on diet, may increase statin if not at goal with A1c control and lifestyle changes  Hypertension: Patient at goal <130/80 BP home report-ranges from 110s-130s/65-80 Continue current regimen--on ACEi  Patient Goals/Self-Care Activities patient will:  - take medications as prescribed as evidenced by patient report and record review check glucose DAILY OR IF SYMPTOMATIC, document, and provide at future appointments collaborate with provider on medication access solutions target a minimum of 150 minutes of moderate intensity exercise weekly engage in dietary modifications by FOLLOWING HEALTHY PLATE METHOD/HEART HEALTHY DIET     Plan: Telephone follow up appointment with care management team member scheduled for:  1 month   JulRegina EckharmD, BCPS Clinical Pharmacist, [place] Phone [phone]

## 2021-06-15 NOTE — Interval H&P Note (Signed)
History and Physical Interval Note:  06/15/2021 7:13 AM  Sara Stuart  has presented today for surgery, with the diagnosis of Traumatic complete tear of right rotator cuff.  The various methods of treatment have been discussed with the patient and family. After consideration of risks, benefits and other options for treatment, the patient has consented to  Procedure(s): SHOULDER ARTHROSCOPY WITH ROTATOR CUFF REPAIR (Right) as a surgical intervention.  The patient's history has been reviewed, patient examined, no change in status, stable for surgery.  I have reviewed the patient's chart and labs.  Questions were answered to the patient's satisfaction.     Oliver Barre

## 2021-06-15 NOTE — Op Note (Signed)
Orthopaedic Surgery Operative Note (CSN: [id number])  Sara Stuart  February 16, 1955 Date of Surgery: 06/15/2021   Diagnoses:  Traumatic complete tear of right rotator cuff  Procedure: Arthroscopic subacromial decompression Arthroscopic rotator cuff repair   Operative Finding Exam under anesthesia: Full ROM Articular space: No loose bodies, capsule intact, labrum intact with some synovitis Chondral surfaces:Intact, no sign of chondral degeneration on the glenoid or humeral head Biceps: Intact, no synovitis, within the groove Subscapularis: Intact Superior Cuff: Full thickness tear, anterior rotator cuff Bursal side: Minimal bursitis, no subacromial spurring  Successful completion of the planned procedure.  Biceps intact.  Limited debridement within the glenohumeral joint.  Double row arthroscopic rotator cuff repair with good compression and coverage of the humeral head.    Post-Op Diagnosis: Same Surgeons:Primary: Mordecai Rasmussen, MD Assisting: Carole Civil, MD Assistants: Arther Abbott was needed for assistance with the arthroscopy, holding the camera and placing the anchors.   Location: [facility] OR ROOM 4 Anesthesia: General with Exparel interscalene block Antibiotics: Ancef 2 g Tourniquet time: None Estimated Blood Loss: 20 cc  Complications: None Specimens: None Implants: Implant Name Type Inv. Item Serial No. Manufacturer Lot No. LRB No. Used Action  IMPLANT SPEEDBRIDGE KIT - [id number] Orthopedic Implant IMPLANT SPEEDBRIDGE KIT  [place] [id number] Right 1 Implanted    Indications for Surgery:   Sara Stuart is a 66 y.o. female with a right shoulder rotator cuff tear.  She attempted nonoperative management, including medications, therapy, home exercises and injections.  We discussed options including continued rehab versus surgery.  Family and patient understand the nature of postop recovery.  The risks and benefits were explained at length including but not limited to  continued pain, cuff failure, instability, pain, hardware malfunction, infection and stiffness were all discussed.  There is also possibility of more severe complications associated with anesthesia.  She elected to proceed.  Surgical consent was finalized.   Procedure:   Patient was correctly identified in the preoperative holding area and operative site marked.  Patient brought to OR and positioned supine on a standard OR table with the Schlein attachment in beachchair position.  Anesthesia was induced and the operative shoulder was prepped and draped in the usual sterile fashion.  Timeout was called preincision.  We began by making our standard portals.  We made a posterior viewing portal, inferior and medial to the posterior lateral corner of the acromion.  We confirmed placement within the glenohumeral joint.  We completed a diagnostic arthroscopy, and the findings are listed above.  We then proceeded to make an anterior portal, within the interval.  This was just above the subscapularis tendon.  We then introduced a shaver, and debrided some of the synovitis that was noted during her diagnostic arthroscopy.  The biceps tendon was intact.  There is no synovitis.  It was within the groove.  At the attachment, there was a small amount of synovitis, and this was debrided.  As a result, made the decision to leave the biceps intact.  We noted a full-thickness tear of the rotator cuff, with some delaminated tissue.  In order to allow [place] the best visualization during the repair, we created an anterior and the lateral portal, using outside in technique.  We then placed the camera in the subacromial space.  There was a small amount of bursitis.  This was debrided with a combination of shaver and electrocautery.  The undersurface of the acromion was identified, and debrided using electrocautery.  There is no spurring that  needed to be addressed.  We proceeded to clear out the remaining bursitis, and the rotator  cuff tear was evaluated.  It was very anterior.  There was a delaminated and frayed tissue.  The tissue was lightly debrided with a shaver.  The footprint was identified.  Once the frayed and delaminated tissue was lightly debrided, the tear was approximately 1.5 cm, with approximately 2 cm of retraction at the apex of the crescent-shaped tear.  The footprint for the rotator cuff was then lightly decorticated using a bone cutter shaver.  We used a tissue grasper, to ensure that the tissue was mobile enough.  We had excellent coverage of the defect, and the tendon easily reached the footprint.  Using percutaneous stab incisions, we then placed 2 anchors that were loaded with switch stitches.  Each individual tape was passed using a scorpion device.  These wedge was then cut, leaving 2 arms of the suture at the anterior aspect of the defect, as well as 2 arms of the suture at the posterior aspect of the defect.  1 suture from each anchor was then brought out through the cannula, and we prepared to place this in our lateral row.  The soft tissue was cleared off the proximal aspect of the humeral shaft, we used the punch to create a hole for our swivel lock anchor.  The sutures were passed through the anchor, and the anchor was secured.  We evaluated the suture, and determined that we had excellent tension, and the defect was being covered with the remaining tendinous tissue.  This was repeated 1 more time in the anterior aspect of the defect.  We had excellent coverage of the defect, and good tension on the tissue, as well as the sutures.  We then lightly debrided the surrounding soft tissues.  Hemostasis was maintained.  Final pictures were taken.  The camera was removed from the shoulder.    Post-operative plan:  The patient will be non-weightbearing in a sling for 4-6 weeks with PT to start after her first postoperative visit.   The patient will be discharged home.   DVT prophylaxis not indicated in  ambulatory upper extremity patient without known risk factors.    Pain control with PRN pain medication preferring oral medicines.   Follow up plan will be scheduled in approximately 7-10 days for incision check and XR.

## 2021-06-15 NOTE — Transfer of Care (Addendum)
Immediate Anesthesia Transfer of Care Note  Patient: Sara Stuart  Procedure(s) Performed: SHOULDER ARTHROSCOPY WITH ROTATOR CUFF REPAIR (Right: Shoulder)  Patient Location: PACU  Anesthesia Type:General and Regional  Level of Consciousness: awake  Airway & Oxygen Therapy: Patient Spontanous Breathing and Patient connected to nasal cannula oxygen  Post-op Assessment: Report given to RN and Post -op Vital signs reviewed and stable  Post vital signs: Reviewed and stable  Last Vitals:  Vitals Value Taken Time  BP    Temp    Pulse    Resp    SpO2      Last Pain:  Vitals:   06/15/21 0657  TempSrc: Oral  PainSc: 4       Patients Stated Pain Goal: 7 (06/15/21 0657)  Complications: No notable events documented.

## 2021-06-15 NOTE — Discharge Instructions (Addendum)
Mark A. Dallas Schimke, MD MS [facility] [address] [place],  [place]  [address] Phone: [phone] Fax: [phone]    POST-OPERATIVE INSTRUCTIONS - SHOULDER ARTHROSCOPY  WOUND CARE You may remove the Operative Dressing on Post-Op Day #3 (72hrs after surgery).   Alternatively if you would like you can leave dressing on until follow-up if within 7-8 days but keep it dry. Leave steri-strips in place until they fall off on their own, usually 2 weeks postop. There may be a small amount of fluid/bleeding leaking at the surgical site. This is normal; the shoulder is filled with fluid during the procedure and can leak for 24-48hrs after surgery. ou may change/reinforce the bandage as needed.  Use the Cryocuff or Ice as often as possible for the first 7 days, then as needed for pain relief. Always keep a towel, ACE wrap or other barrier between the cooling unit and your skin.  You may shower on Post-Op Day #3. Gently pat the area dry. Do not soak the shoulder in water or submerge it. Keep dry incisions as dry as possible. Do not go swimming in the pool or ocean until 4 weeks after surgery or when otherwise instructed.    EXERCISES/BRACING Sling should be used at all times until follow-up.  You can remove sling for hygiene.    Please continue to ambulate and do not stay sitting or lying for too long. Perform foot and wrist pumps to assist in circulation.  POST-OP MEDICATIONS- Multimodal approach to pain control In general your pain will be controlled with a combination of substances.  Prescriptions unless otherwise discussed are electronically sent to your pharmacy.  This is a carefully made plan we use to minimize narcotic use.    Ibuprofen 800 mg - you already have this medication, recommend you take it twice daily Acetaminophen - Non-narcotic pain medicine taken on a scheduled basis  Oxycodone - This is a strong narcotic, to be used only on an "as needed" basis for  pain. Aspirin 81mg  - This medicine is used to minimize the risk of blood clots after surgery. Zofran - take as needed for nausea   FOLLOW-UP If you develop a Fever (?101.5), Redness or Drainage from the surgical incision site, please call our office to arrange for an evaluation. Please call the office to schedule a follow-up appointment for your suture removal, 10-14 days post-operatively.    HELPFUL INFORMATION  If you had a block, it will wear off between 8-24 hrs postop typically.  This is period when your pain may go from nearly zero to the pain you would have had postop without the block.  This is an abrupt transition but nothing dangerous is happening.  You may take an extra dose of narcotic when this happens.  You may be more comfortable sleeping in a semi-seated position the first few nights following surgery.  Keep a pillow propped under the elbow and forearm for comfort.  If you have a recliner type of chair it might be beneficial.  If not that is fine too, but it would be helpful to sleep propped up with pillows behind your operated shoulder as well under your elbow and forearm.  This will reduce pulling on the suture lines.  When dressing, put your operative arm in the sleeve first.  When getting undressed, take your operative arm out last.  Loose fitting, button-down shirts are recommended.  Often in the first days after surgery you may be more comfortable keeping  your operative arm under your shirt and not through the sleeve.  You may return to work/school in the next couple of days when you feel up to it.  Desk work and typing in the sling is     fine.  We suggest you use the pain medication the first night prior to going to bed, in order to ease any pain when the anesthesia wears off. You should avoid taking pain medications on an empty stomach as it will make you nauseous.  You should wean off your narcotic medicines as soon as you are able.  Most patients will be off or using  minimal narcotics before their first postop appointment.   Do not drink alcoholic beverages or take illicit drugs when taking pain medications.  It is against the law to drive while taking narcotics.  In some states it is against the law to drive while your arm is in a sling.   Pain medication may make you constipated.  Below are a few solutions to try in this order: Decrease the amount of pain medication if you aren't having pain. Drink lots of decaffeinated fluids. Drink prune juice and/or eat dried prunes  If the first 3 don't work start with additional solutions Take Colace - an over-the-counter stool softener Take Senokot - an over-the-counter laxative Take Miralax - a stronger over-the-counter laxative

## 2021-06-15 NOTE — Anesthesia Preprocedure Evaluation (Signed)
Anesthesia Evaluation  Patient identified by MRN, date of birth, ID band Patient awake    Reviewed: Allergy & Precautions, H&P , NPO status , Patient's Chart, lab work & pertinent test results, reviewed documented beta blocker date and time   History of Anesthesia Complications (+) PONV and history of anesthetic complications  Airway Mallampati: II  TM Distance: >3 FB Neck ROM: full    Dental no notable dental hx.    Pulmonary neg pulmonary ROS,    Pulmonary exam normal breath sounds clear to auscultation       Cardiovascular Exercise Tolerance: Good hypertension, negative cardio ROS   Rhythm:regular Rate:Normal     Neuro/Psych negative neurological ROS  negative psych ROS   GI/Hepatic Neg liver ROS, GERD  Medicated,  Endo/Other  negative endocrine ROSdiabetes, Type 2  Renal/GU negative Renal ROS  negative genitourinary   Musculoskeletal   Abdominal   Peds  Hematology negative hematology ROS (+)   Anesthesia Other Findings   Reproductive/Obstetrics negative OB ROS                             Anesthesia Physical Anesthesia Plan  ASA: 3  Anesthesia Plan: General and General ETT   Post-op Pain Management: Regional block*   Induction:   PONV Risk Score and Plan:   Airway Management Planned:   Additional Equipment:   Intra-op Plan:   Post-operative Plan:   Informed Consent: I have reviewed the patients History and Physical, chart, labs and discussed the procedure including the risks, benefits and alternatives for the proposed anesthesia with the patient or authorized representative who has indicated his/her understanding and acceptance.     Dental Advisory Given  Plan Discussed with: CRNA  Anesthesia Plan Comments:         Anesthesia Quick Evaluation

## 2021-06-15 NOTE — Anesthesia Procedure Notes (Signed)
Anesthesia Regional Block: Interscalene brachial plexus block   Pre-Anesthetic Checklist: , timeout performed,  Correct Patient, Correct Site, Correct Laterality,  Correct Procedure, Correct Position, site marked,  Risks and benefits discussed,  Surgical consent,  Pre-op evaluation,  At surgeon's request and post-op pain management  Laterality: Right  Prep: chloraprep       Needles:  Injection technique: Single-shot  Needle Type: Echogenic Stimulator Needle     Needle Length: 4cm  Needle Gauge: 22     Additional Needles:   Procedures:, nerve stimulator,,, ultrasound used (permanent image in chart),,     Nerve Stimulator or Paresthesia:  Response: Deltoid stimulation, 2 mA  Additional Responses:   Narrative:  Start time: 06/15/2021 7:15 AM End time: 06/15/2021 7:27 AM  Performed by: Other  Anesthesiologist: Windell Norfolk, MD CRNA: Yaakov Guthrie, RN

## 2021-06-15 NOTE — Anesthesia Procedure Notes (Signed)
Procedure Name: Intubation Date/Time: 06/15/2021 7:46 AM  Performed by: Windell Norfolk, MDPre-anesthesia Checklist: Patient identified, Emergency Drugs available, Suction available, Patient being monitored and Timeout performed Patient Re-evaluated:Patient Re-evaluated prior to induction Oxygen Delivery Method: Circle system utilized Preoxygenation: Pre-oxygenation with 100% oxygen Induction Type: IV induction Ventilation: Mask ventilation without difficulty Laryngoscope Size: Miller and 2 Grade View: Grade I Tube type: Oral Tube size: 7.5 mm Number of attempts: 1 Airway Equipment and Method: Stylet Placement Confirmation: ETT inserted through vocal cords under direct vision, positive ETCO2 and breath sounds checked- equal and bilateral Secured at: 22 cm Tube secured with: Tape Dental Injury: Teeth and Oropharynx as per pre-operative assessment

## 2021-06-15 NOTE — Progress Notes (Signed)
Right interscalene nerve block by Dr Johnnette Litter Assistant Lorin Picket Redmon RN Anesthesia student Time out 7:71  Start 7:20 Stop 7:27

## 2021-06-16 NOTE — Anesthesia Postprocedure Evaluation (Signed)
Anesthesia Post Note  Patient: Sara Stuart  Procedure(s) Performed: SHOULDER ARTHROSCOPY WITH ROTATOR CUFF REPAIR (Right: Shoulder)  Patient location during evaluation: Phase II Anesthesia Type: General Level of consciousness: awake Pain management: pain level controlled Vital Signs Assessment: post-procedure vital signs reviewed and stable Respiratory status: spontaneous breathing and respiratory function stable Cardiovascular status: blood pressure returned to baseline and stable Postop Assessment: no headache and no apparent nausea or vomiting Anesthetic complications: no Comments: Late entry   No notable events documented.   Last Vitals:  Vitals:   06/15/21 1115 06/15/21 1121  BP: 131/69   Pulse: 69 67  Resp: 15 16  Temp: 36.6 C 36.6 C  SpO2: 99% 95%    Last Pain:  Vitals:   06/16/21 1024  TempSrc:   PainSc: 7                  Windell Norfolk

## 2021-06-17 ENCOUNTER — Encounter: Payer: Self-pay | Admitting: Orthopedic Surgery

## 2021-06-18 ENCOUNTER — Encounter: Payer: Self-pay | Admitting: Orthopedic Surgery

## 2021-06-22 ENCOUNTER — Encounter (HOSPITAL_COMMUNITY): Payer: Self-pay | Admitting: Orthopedic Surgery

## 2021-06-23 ENCOUNTER — Encounter: Payer: Self-pay | Admitting: Orthopedic Surgery

## 2021-06-23 ENCOUNTER — Ambulatory Visit (INDEPENDENT_AMBULATORY_CARE_PROVIDER_SITE_OTHER): Payer: Medicare Other | Admitting: Orthopedic Surgery

## 2021-06-23 ENCOUNTER — Ambulatory Visit (INDEPENDENT_AMBULATORY_CARE_PROVIDER_SITE_OTHER): Payer: Medicare Other

## 2021-06-23 DIAGNOSIS — Z9889 Other specified postprocedural states: Secondary | ICD-10-CM | POA: Diagnosis not present

## 2021-06-23 MED ORDER — GABAPENTIN 100 MG PO CAPS: 100.0000 mg | ORAL_CAPSULE | Freq: Three times a day (TID) | ORAL | 0 refills | Status: DC

## 2021-06-23 MED ORDER — OXYCODONE HCL 5 MG PO TABS: 5.0000 mg | ORAL_TABLET | ORAL | 0 refills | Status: AC | PRN

## 2021-06-28 ENCOUNTER — Ambulatory Visit: Payer: Medicare Other

## 2021-06-28 ENCOUNTER — Ambulatory Visit (INDEPENDENT_AMBULATORY_CARE_PROVIDER_SITE_OTHER): Payer: Medicare Other | Admitting: Pharmacist

## 2021-06-28 DIAGNOSIS — E1165 Type 2 diabetes mellitus with hyperglycemia: Secondary | ICD-10-CM

## 2021-06-28 DIAGNOSIS — E782 Mixed hyperlipidemia: Secondary | ICD-10-CM

## 2021-06-29 ENCOUNTER — Ambulatory Visit: Payer: Medicare Other | Attending: Orthopedic Surgery | Admitting: Physical Therapy

## 2021-06-29 DIAGNOSIS — G8929 Other chronic pain: Secondary | ICD-10-CM

## 2021-06-29 DIAGNOSIS — Z9889 Other specified postprocedural states: Secondary | ICD-10-CM | POA: Insufficient documentation

## 2021-06-29 DIAGNOSIS — M25611 Stiffness of right shoulder, not elsewhere classified: Secondary | ICD-10-CM

## 2021-06-29 DIAGNOSIS — M25511 Pain in right shoulder: Secondary | ICD-10-CM | POA: Insufficient documentation

## 2021-06-29 NOTE — Therapy (Addendum)
OUTPATIENT PHYSICAL THERAPY SHOULDER EVALUATION   Patient Name: Sara Stuart MRN: [id number] DOB:August 08, 1955, 66 y.o., female Today's Date: 06/29/2021   PT End of Session - 06/29/21 1400     Visit Number 1    Number of Visits 12    Date for PT Re-Evaluation 08/10/21    Authorization Type FOTO AT LEAST EVERY 5TH VISIT.  PROGRESS NOTE AT 10TH VISIT.  KX MODIFIER AFTER 15 VISITS.    PT Start Time 0143    PT Stop Time 0225    PT Time Calculation (min) 42 min    Activity Tolerance Patient tolerated treatment well    Behavior During Therapy WFL for tasks assessed/performed             Past Medical History:  Diagnosis Date   Arthritis    Chronic back pain    spondylolishthesis   DDD (degenerative disc disease), lumbosacral    Diabetes mellitus without complication (HCC) diagnosed in 02/2014   takes Metformin and Glipizide daily   GERD (gastroesophageal reflux disease)    takes Omeprazole daily   Gout    takes Colchicine daily as needed   Hypertension    takes Dyazide and Diltiazem daily   Nocturia    Polio    PONV (postoperative nausea and vomiting)    Wears glasses    Past Surgical History:  Procedure Laterality Date   BACK SURGERY  07/2012   bladder tacked      COLONOSCOPY     FOOT SURGERY     more than 5 surgeries on right foot related to polio   SHOULDER ARTHROSCOPY WITH ROTATOR CUFF REPAIR Right 06/15/2021   Procedure: SHOULDER ARTHROSCOPY WITH ROTATOR CUFF REPAIR;  Surgeon: Oliver Barre, MD;  Location: [facility];  Service: Orthopedics;  Laterality: Right;   surgery for polio     states x 5    TOTAL ABDOMINAL HYSTERECTOMY  2002   TUBAL PREGNANCY   TUBAL LIGATION     Patient Active Problem List   Diagnosis Date Noted   Aortic atherosclerosis (HCC) 11/01/2020   Pain of right upper extremity 06/28/2020   Insomnia 12/18/2019   Coronary artery disease involving native coronary artery of native heart without angina pectoris 02/03/2019   Mixed hyperlipidemia  10/10/2017   Uncontrolled type 2 diabetes mellitus with hyperglycemia, with long-term current use of insulin (HCC) 03/14/2017   Essential hypertension, benign 03/14/2017   GERD (gastroesophageal reflux disease) 12/29/2012   History of gout 12/29/2012   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   Morbid obesity (HCC) 12/29/2012   Degenerative spondylolisthesis 07/08/2012   Spinal stenosis, lumbar region, with neurogenic claudication 07/08/2012    REFERRING PROVIDER: Thane Edu MD.  REFERRING DIAG: S/p complete tear of rotator cuff.  THERAPY DIAG:  Chronic right shoulder pain - Plan: PT plan of care cert/re-cert  Stiffness of right shoulder, not elsewhere classified - Plan: PT plan of care cert/re-cert  Rationale for Evaluation and Treatment Rehabilitation  ONSET DATE: 06/15/21 (surgery date).  SUBJECTIVE:  SUBJECTIVE STATEMENT:  The patient presents to the clinic today s/p right shoulder RTC repair performed on 06/15/21.  She is reporting a 5/10 pain-level at rest but much higher when attempting movement.  She has been compliant to using her sling.   PERTINENT HISTORY: OA, DDD, Back surgery.  PAIN:  Are you having pain? Yes: NPRS scale: 5/10 Pain location: Right shoulder and biceps. Pain description: Ache, throb. Aggravating factors: Movement. Relieving factors: Rest.  PRECAUTIONS: Other: Progress per RTC protocol.  WEIGHT BEARING RESTRICTIONS  No UE weight bearing.  FALLS:  Has patient fallen in last 6 months? No  LIVING ENVIRONMENT:  Single dwelling.  Ind.    PLOF: Independent.  PATIENT GOALS :  Use right UE without pain.  OBJECTIVE:    PATIENT SURVEYS:  FOTO Complete.  COGNITION:  Overall cognitive status: Within functional limits for tasks  assessed       POSTURE: Guarded.  UPPER EXTREMITY ROM:  In supine:  Gentle right shoulder PROM into flexion is 60 degrees, abduction is 40 degrees and ER is 10 degrees.  PALPATION:  Tender to light palpation over patient's right anterior shoulder region.   TODAY'S TREATMENT:  Vasopneumatic with pillow between thorax and elbow x 15 minutes.   ASSESSMENT:  CLINICAL IMPRESSION: Patient is a 66 y.o. female who was seen today for physical therapy evaluation and treatment for evaluation following a right shoulder RTC repair performed on 06/15/21.  As expected her passive range of motion is limited.  She has been compliant to using her sling. She is very tender to even light palpation over her right shoulder anteriorly.  Patient will benefit from skilled physical therapy intervention to address pain and deficits.   OBJECTIVE IMPAIRMENTS decreased activity tolerance,pain, decreased range of motion.  ACTIVITY LIMITATIONS carrying, lifting, other.  PARTICIPATION LIMITATIONS: meal prep, cleaning, and other.  REHAB POTENTIAL: Excellent  CLINICAL DECISION MAKING: Stable/uncomplicated  EVALUATION COMPLEXITY: Low   GOALS: Goals reviewed with patient? Yes    LONG TERM GOALS: Target date: 08/10/2021  (Remove Blue Hyperlink)  Independent with a HEP. Baseline:  Goal status: INITIAL 35 degrees so the patient can easily reach overhead. Baseline:  Goal status: INITIAL  3.  Active ER to 70 degrees+ to allow for easily donning/doffing of apparel. Baseline:  Goal status: INITIAL  4.  Increase ROM so patient is able to reach behind back to L4. Baseline:  Goal status: INITIAL 5.  Perform ADL's with pain not > 3/10. Baseline:  Goal status: INITIAL   PLAN:2 to 3 times a week for 6 weeks. PT DURATION: 6 weeks  PLANNED INTERVENTIONS: Therapeutic exercises, Therapeutic activity, Neuromuscular re-education, Patient/Family education, Joint mobilization, Electrical stimulation,  Vasopneumatic device, and Ultrasound  PLAN FOR NEXT SESSION: Begin with gentle right shoulder PROM.  Vasopneumatic on low with pillow between thorax and elbow.   Daemyn Gariepy, [place], PT 06/29/2021, 2:55 PM

## 2021-06-30 DIAGNOSIS — E1159 Type 2 diabetes mellitus with other circulatory complications: Secondary | ICD-10-CM | POA: Diagnosis not present

## 2021-06-30 DIAGNOSIS — I10 Essential (primary) hypertension: Secondary | ICD-10-CM

## 2021-06-30 DIAGNOSIS — E785 Hyperlipidemia, unspecified: Secondary | ICD-10-CM

## 2021-06-30 DIAGNOSIS — Z794 Long term (current) use of insulin: Secondary | ICD-10-CM

## 2021-06-30 NOTE — Progress Notes (Signed)
Chronic Care Management Pharmacy Note  06/28/2021 Name:  Sara Stuart MRN:  [id number] DOB:  Mar 15, 1955  Summary:  Diabetes: Goal on Track (progressing): YES. "Uncontrolled"- A1C 7.8-->7.0%,  A1C practically at goal for patient, however still having HYPERglycemia and HYPOglycemia Patient reports blood sugar is running higher post-rotator cuff suergey Increased pain & diet not as well controlled since she is unable to prepare meals at this time She will work to increase meal time insulin with heavier carb meals to cover the highs Current treatment: Tresiba 25 units AT BEDTIME, Humulin 70/30 mix pen (10-30 units with each meal), Onglyza 74m, Farxiga 111m metformin; Humulin 70/30 mix pen is familiar to her since her husband is also on--we have tried x2 to transition her over to basal/bolus, but this has been unsuccessful.  Patient resorts back to previous regimen if she begins to run high (concern for hypoglycemia/overlap with mix+basal+other meds) Noted intolerance to Trulicity (may consider Rybelsus, low dose ozempic, mounjaro to decrease insulin requirements) Denies personal and family history of Medullary thyroid cancer (MTC) Continue Tresiba 25 units  Continue mix insulin 70/30 to 10-30 units --patient doing 2-3 times daily (COUNSELED PATIENT TO BACK DOWN on MEAL TIME INSULIN WHEN EATING LESS--THIS IS USUALLY AT LUNCH TIME (only do 10units); DO NOT WAIT MORE THE 4 HOURS TO EAT DINNER; USE LIBRE TO GUIDE YOUR TREATMENT AND FOOD DECISIONS) Would like to have patient on rapid acting insulin only (not mix)--this has not worked out x2[employer] developerbout hypo events-->patient denies hypoglycemia in the past 2 weeks; counseled on administration of insulin and eating 3 steady meals daily (she was waiting to late to eat dinner and going low) Working to get liEnergy East CorporationGM 2 (cost is $35/month) Continue ongylza (free via az&me patient assistance program) Continue farxiga (free via az&me patient assistance  program) Continue metformin Current glucose readings: fasting glucose: <200, post prandial glucose: 200s Reports hypoglycemia has improved/no events x4 weeks The patient is asked to make an attempt to improve diet and exercise patterns to aid in medical management of this problem. Discussed meal planning options and Plate method for healthy eating Avoid sugary drinks and desserts Incorporate balanced protein, non starchy veggies, 1 serving of carbohydrate with each meal Increase water intake Increase physical activity as able Current exercise: n/a-->encouraged Recommended heart healthy diet, healthy plate method handout given; work on transition DM regimen Assessed patient finances. Patient approved for [place] patient assistance for Farxiga and Onglyza (sub for januvia-easier to get via patient assistance)--patient approved until 12/31/21, Humalog mix insulin 70/30--free via [employer] patient assistance  Subjective: GrSheyanne Munleys an 66 year old female who is a primary patient of Sara Stuart.  The CCM team was consulted for assistance with disease management and care coordination needs.    Engaged with patient by telephone for follow up visit in response to provider referral for pharmacy case management and/or care coordination services.   Consent to Services:  The patient was given information about Chronic Care Management services, agreed to services, and gave verbal consent prior to initiation of services.  Please see initial visit note for detailed documentation.   Patient Care Team: Sara Stuart as PCP - General (Family Medicine) [facility] Pharmacist (Family Medicine) Sara KhatOD[facility])  Objective:  Lab Results  Component Value Date   CREATININE 0.94 05/10/2021   CREATININE 0.87 02/09/2021   CREATININE 0.96 11/01/2020    Lab Results  Component Value Date  HGBA1C 7.0 (H) 05/10/2021   Last diabetic Eye exam:  Lab Results   Component Value Date/Time   HMDIABEYEEXA No Retinopathy 06/23/2020 12:00 AM    Last diabetic Foot exam: No results found for: "HMDIABFOOTEX"      Component Value Date/Time   CHOL 146 05/10/2021 0857   TRIG 122 05/10/2021 0857   HDL 57 05/10/2021 0857   CHOLHDL 2.6 05/10/2021 0857   CHOLHDL 3.8 05/02/2017 1617   LDLCALC 67 05/10/2021 0857   LDLCALC 111 (H) 05/02/2017 1617       Latest Ref Rng & Units 05/10/2021    8:57 AM 02/09/2021    2:36 PM 11/01/2020   10:22 AM  Hepatic Function  Total Protein 6.0 - 8.5 g/dL 6.9  7.3  7.0   Albumin 3.8 - 4.8 g/dL 4.4  4.5  4.2   AST 0 - 40 IU/L '19  20  17   ' ALT 0 - 32 IU/L '14  17  15   ' Alk Phosphatase 44 - 121 IU/L 79  82  80   Total Bilirubin 0.0 - 1.2 mg/dL 0.2  0.2  0.3     Lab Results  Component Value Date/Time   TSH 1.41 05/02/2017 04:17 PM   FREET4 1.1 05/02/2017 04:17 PM       Latest Ref Rng & Units 05/10/2021    8:57 AM 02/09/2021    2:36 PM 11/01/2020   10:22 AM  CBC  WBC 3.4 - 10.8 x10E3/uL 7.8  11.7  9.0   Hemoglobin 11.1 - 15.9 g/dL 13.7  14.1  13.3   Hematocrit 34.0 - 46.6 % 41.2  43.2  42.3   Platelets 150 - 450 x10E3/uL 286  314  306     Lab Results  Component Value Date/Time   VD25OH 22 (L) 05/02/2017 04:17 PM    Clinical ASCVD: no The 10-year ASCVD risk score (Arnett DK, et al., 2019) is: 12.2%   Values used to calculate the score:     Age: 24 years     Sex: Female     Is Non-Hispanic African American: No     Diabetic: Yes     Tobacco smoker: No     Systolic Blood Pressure: 161 mmHg     Is BP treated: Yes     HDL Cholesterol: 57 mg/dL     Total Cholesterol: 146 mg/dL    Other: (CHADS2VASc if Afib, PHQ9 if depression, MMRC or CAT for COPD, ACT, DEXA)  Social History   Tobacco Use  Smoking Status Never  Smokeless Tobacco Never   BP Readings from Last 3 Encounters:  06/15/21 131/69  06/12/21 117/74  05/10/21 112/76   Pulse Readings from Last 3 Encounters:  06/15/21 67  06/12/21 80  05/10/21  64   Wt Readings from Last 3 Encounters:  06/15/21 203 lb 0.7 oz (92.1 kg)  06/12/21 203 lb (92.1 kg)  05/10/21 208 lb (94.3 kg)    Assessment: Review of patient past medical history, allergies, medications, health status, including review of consultants reports, laboratory and other test data, was performed as part of comprehensive evaluation and provision of chronic care management services.   SDOH:  (Social Determinants of Health) assessments and interventions performed:    CCM Care Plan  Allergies  Allergen Reactions   Trulicity [Dulaglutide] Other (See Comments)    GI intolerance    Medications Reviewed Today     Reviewed by Sara Stuart, [facility] (Pharmacist) on 06/30/21 at 0905  Med List Status: <  None>   Medication Order Taking? Sig Documenting Provider Last Dose Status Informant  aspirin EC 81 MG tablet [id number] No Take 1 tablet (81 mg total) by mouth daily. Orson Eva, MD Taking Active Self           Med Note [facility] A   Thu Mar 14, 2017  2:26 PM)    baclofen (LIORESAL) 10 MG tablet [id number] No Take 1 tablet (10 mg total) by mouth 3 (three) times daily as needed. Loman Brooklyn, FNP Taking Active   Continuous Blood Gluc Receiver (FREESTYLE LIBRE 2 READER) DEVI [id number] No Use to test blood sugar continuously. DX: E11.65 Loman Brooklyn, FNP Taking Active            Med Note Blanca Friend, Royce Macadamia   Thu Jun 01, 2021 11:06 AM) SENT TO [facility] [employer]ET[employer] [facility]PPL[facility] VIA [place] PORTAL  Continuous Blood Gluc Sensor (FREESTYLE LIBRE 2 SENSOR) MISC [id number] No Use to test blood sugar continuously.  Replace sensor every 14 days. DX: E11.65 Loman Brooklyn, FNP Taking Active            Med Note Blanca Friend, Royce Macadamia   Thu Jun 01, 2021 11:07 AM) SENT TO [facility]P[employer] VIA [place] PORTAL   dapagliflozin propanediol (FARXIGA) 10 MG TABS tablet [id number] No Take 1 tablet (10 mg total) by mouth daily before breakfast. Loman Brooklyn, FNP Taking Active            Med Note  Blanca Friend, Marcellus Scott May 31, 2021  4:13 PM) Via AZ&me patient assistance program   diltiazem (DILT-XR) 120 MG 24 hr capsule [id number] No Take 1 capsule (120 mg total) by mouth daily. Loman Brooklyn, FNP Taking Active   fluticasone [facility]) 50 MCG/ACT nasal spray [id number] No Place 2 sprays into both nostrils daily as needed. [provider] Taking Active   gabapentin (NEURONTIN) 100 MG capsule [id number]  Take 1 capsule (100 mg total) by mouth 3 (three) times daily. Mordecai Rasmussen, MD  Active   glucose blood test strip [id number] No Test BS four times daily Dx E11.65 Ronnie Doss M, DO Taking Active   ibuprofen (ADVIL) 800 MG tablet [id number] No TAKE 1 TABLET BY MOUTH EVERY 8 HOURS AS NEEDED FOR MODERATE PAIN Loman Brooklyn, FNP Taking Active   insulin degludec (TRESIBA FLEXTOUCH) 100 UNIT/ML FlexTouch Pen [id number] No Inject 26 Units into the skin daily.  Patient taking differently: Inject 20-26 Units into the skin daily.   Loman Brooklyn, FNP Taking Active            Med Note [facility], Avel Peace   Thu Jun 15, 2021  6:38 AM) Evelina Bucy only 1/2 dose 15 units   insulin isophane & regular human KwikPen (HUMULIN 70/30 KWIKPEN) (70-30) 100 UNIT/ML KwikPen [id number] No Inject 35 Units into the skin with breakfast, with lunch, and with evening meal.  Patient taking differently: Inject 20-35 Units into the skin with breakfast, with lunch, and with evening meal.   Loman Brooklyn, FNP Taking Active   Insulin Pen Needle (B-D ULTRAFINE III SHORT PEN) 31G X 8 MM MISC [id number] No 1 each by Does not apply route as directed. Cassandria Anger, MD Taking Active Self  lisinopril (ZESTRIL) 2.5 MG tablet [id number] No Take 1 tablet (2.5 mg total) by mouth daily. Loman Brooklyn, FNP Taking Active   metFORMIN (GLUCOPHAGE-XR) 500 MG 24 hr tablet [id number] No Take 2 tablets (1,000 mg total) by mouth 2 (  two) times daily. Loman Brooklyn, FNP Taking Active   metoprolol tartrate (LOPRESSOR) 25 MG  tablet [id number] No Take 1 tablet (25 mg total) by mouth daily. Loman Brooklyn, FNP Taking Active   nitroGLYCERIN (NITROSTAT) 0.4 MG SL tablet [id number] No DISSOLVE ONE TABLET UNDER THE TONGUE EVERY 5 MINUTES AS NEEDED FOR CHEST PAIN.  DO NOT EXCEED A TOTAL OF 3 DOSES IN 15 MINUTES Fay Records, MD Taking Active   omeprazole (PRILOSEC) 40 MG capsule [id number] No Take 1 capsule by mouth once daily Loman Brooklyn, FNP Taking Active   oxyCODONE (ROXICODONE) 5 MG immediate release tablet [id number]  Take 1 tablet (5 mg total) by mouth every 4 (four) hours as needed for up to 7 days. Mordecai Rasmussen, MD  Active   rosuvastatin (CRESTOR) 10 MG tablet [id number] No Take 1 tablet by mouth once daily Evelina Dun A, FNP Taking Active   saxagliptin HCl (ONGLYZA) 5 MG TABS tablet [id number] No Take 1 tablet (5 mg total) by mouth daily. Loman Brooklyn, FNP Taking Active            Med Note Blanca Friend, Sherian Maroon Jun 01, 2021 11:07 AM) Via AZ&me patient assistance program   spironolactone (ALDACTONE) 25 MG tablet [id number] No Take 1/2 (one-half) tablet by mouth once daily Loman Brooklyn, FNP Taking Active   traZODone (DESYREL) 50 MG tablet [id number] No TAKE 1 TABLET BY MOUTH AT BEDTIME Hendricks Limes F, FNP Taking Active   triamterene-hydrochlorothiazide (DYAZIDE) 37.5-25 MG capsule [id number] No Take 1 each (1 capsule total) by mouth daily. Loman Brooklyn, FNP Taking Active   Med List Note Sara Stuart [facility] 06/01/21 1127): Prescription assistance for Onglyza and Farxiga--ESCRIBE TO [employer] (AZ&ME PATIENT ASSISTANCE) Prescription assistance for HUMALOG KWIKPEN 70/30--ESCRIBE TO [facility] [place] ([employer] patient assistance)             Patient Active Problem List   Diagnosis Date Noted   Aortic atherosclerosis ([place]) 11/01/2020   Pain of right upper extremity 06/28/2020   Insomnia 12/18/2019   Coronary artery disease involving native coronary artery of native  heart without angina pectoris 02/03/2019   Mixed hyperlipidemia 10/10/2017   Uncontrolled type 2 diabetes mellitus with hyperglycemia, with long-term current use of insulin ([place]) 03/14/2017   Essential hypertension, benign 03/14/2017   GERD (gastroesophageal reflux disease) 12/29/2012   History of gout 12/29/2012   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   Morbid obesity ([place]) 12/29/2012   Degenerative spondylolisthesis 07/08/2012   Spinal stenosis, lumbar region, with neurogenic claudication 07/08/2012    Immunization History  Administered Date(s) Administered   Influenza, High Dose Seasonal PF 10/22/2020   Influenza, Quadrivalent, Recombinant, Inj, Pf 09/18/2018   Influenza,inj,Quad PF,6+ Mos 09/21/2017   Influenza,inj,quad, With Preservative 01/05/2017, 09/21/2017   Influenza,trivalent, recombinat, inj, PF 10/02/2014   Influenza-Unspecified 09/18/2018, 10/02/2019, 10/27/2020   Moderna Sars-Covid-2 Vaccination 11/03/2019, 12/03/2019, 06/16/2020   PNEUMOCOCCAL CONJUGATE-20 11/01/2020   Pneumococcal Polysaccharide-23 09/10/2017   Tdap 01/07/2018   Zoster Recombinat (Shingrix) 02/09/2021    Conditions to be addressed/monitored: HTN, HLD, and DMII  Care Plan : PHARMD MEDICATION MANAGEMENT  Updates made by Sara Stuart, RPH since 06/30/2021 12:00 AM     Problem: DISEASE PROGRESSION PREVENTION      Long-Range Goal: T2DM, HLD, HTN PHARMD GOAL   Recent Progress: Not on track  Priority: High  Note:   Current Barriers:  Unable to independently afford treatment regimen Unable to achieve  control of T2DM  Suboptimal therapeutic regimen for T2DM  Pharmacist Clinical Goal(s):  patient will verbalize ability to afford treatment regimen achieve control of T2DM, HLD as evidenced by GOAL A1C, IMPROVED QUALITY OF LIFE, LDL<70 maintain control of T2DM, HLD as evidenced by GOAL A1C, IMPROVED QUALITY OF LIFE, LDL<70  adhere to plan to optimize therapeutic regimen for T2DM, HLD as  evidenced by report of adherence to recommended medication management changes through collaboration with PharmD and provider.   Interventions: 1:1 collaboration with Loman Brooklyn, FNP regarding development and update of comprehensive plan of care as evidenced by provider attestation and co-signature Inter-disciplinary care team collaboration (see longitudinal plan of care) Comprehensive medication review performed; medication list updated in electronic medical record  Diabetes: Goal on Track (progressing): YES. "Uncontrolled"- A1C 7.8-->7.0%,  A1C practically at goal for patient, however still having HYPERglycemia and HYPOglycemia Patient reports blood sugar is running higher post-rotator cuff suergey Increased pain & diet not as well controlled since she is unable to prepare meals at this time She will work to increase meal time insulin with heavier carb meals to cover the highs Current treatment: Tresiba 25 units AT BEDTIME, Humulin 70/30 mix pen (10-30 units with each meal), Onglyza 79m, Farxiga 117m metformin; Humulin 70/30 mix pen is familiar to her since her husband is also on--we have tried x2 to transition her over to basal/bolus, but this has been unsuccessful.  Patient resorts back to previous regimen if she begins to run high (concern for hypoglycemia/overlap with mix+basal+other meds) Noted intolerance to Trulicity (may consider Rybelsus, low dose ozempic, mounjaro to decrease insulin requirements) Denies personal and family history of Medullary thyroid cancer (MTC) Continue Tresiba 25 units  Continue mix insulin 70/30 to 10-30 units --patient doing 2-3 times daily (COUNSELED PATIENT TO BACK DOWN on MEAL TIME INSULIN WHEN EATING LESS--THIS IS USUALLY AT LUNCH TIME (only do 10units); DO NOT WAIT MORE THE 4 HOURS TO EAT DINNER; USE LIBRE TO GUIDE YOUR TREATMENT AND FOOD DECISIONS) Would like to have patient on rapid acting insulin only (not mix)--this has not worked out x2[employer] developerabout hypo events-->patient denies hypoglycemia in the past 2 weeks; counseled on administration of insulin and eating 3 steady meals daily (she was waiting to late to eat dinner and going low) Working to get li[employer]GM 2 (cost is $35/month) Continue ongylza (free via az&me patient assistance program) Continue farxiga (free via az&me patient assistance program) Continue metformin Current glucose readings: fasting glucose: <200, post prandial glucose: 200s Reports hypoglycemia has improved/no events x4 weeks The patient is asked to make an attempt to improve diet and exercise patterns to aid in medical management of this problem. Discussed meal planning options and Plate method for healthy eating Avoid sugary drinks and desserts Incorporate balanced protein, non starchy veggies, 1 serving of carbohydrate with each meal Increase water intake Increase physical activity as able Current exercise: n/a-->encouraged Recommended heart healthy diet, healthy plate method handout given; work on transition DM regimen Assessed patient finances. Patient approved for Az&me patient assistance for Farxiga and Onglyza (sub for januvia-easier to get via patient assistance)--patient approved until 12/31/21, Humalog mix insulin 70/30--free via [employer] patient assistance  Hyperlipidemia:  Goal on Track (progressing): YES. Uncontrolled-LDL 78 current treatment: rosuvastatin 1075maily;  Medications previously tried: n/a  Current dietary patterns: heart healthy diet, healthy plate method handout given Counseled on diet, may increase statin if not at goal with A1c control and lifestyle changes  Hypertension: Patient at goal <130/80  BP home report-ranges from 110s-130s/65-80 Continue current regimen--on ACEi  Patient Goals/Self-Care Activities patient will:  - take medications as prescribed as evidenced by patient report and record review check glucose DAILY OR IF SYMPTOMATIC, document, and provide at future  appointments collaborate with provider on medication access solutions target a minimum of 150 minutes of moderate intensity exercise weekly engage in dietary modifications by FOLLOWING HEALTHY PLATE METHOD/HEART HEALTHY DIET     Medication Assistance:  -HUMALOG MIX PEN 70/30 obtained through [place] medication assistance program.  Enrollment ends 12/31/21--SHIPS TO PATIENTS HOME [phone]) -ONGLYZA & FARXIGA obtained through AZ&ME medication assistance program.  Enrollment ends 12/31/21--SHIPS TO PATIENTS HOME [phone])   Follow Up:  Patient agrees to Care Plan and Follow-up.   Plan: Telephone follow up appointment with care management team member scheduled for:  3 WEEKS   Regina Eck, PharmD, BCPS Clinical Pharmacist, [place]  II Phone [phone]

## 2021-06-30 NOTE — Patient Instructions (Addendum)
Visit Information  Following are the goals we discussed today:  Current Barriers:  Unable to independently afford treatment regimen Unable to achieve control of T2DM  Suboptimal therapeutic regimen for T2DM  Pharmacist Clinical Goal(s):  patient will verbalize ability to afford treatment regimen achieve control of T2DM, HLD as evidenced by GOAL A1C, IMPROVED QUALITY OF LIFE, LDL<70 maintain control of T2DM, HLD as evidenced by GOAL A1C, IMPROVED QUALITY OF LIFE, LDL<70  adhere to plan to optimize therapeutic regimen for T2DM, HLD as evidenced by report of adherence to recommended medication management changes through collaboration with PharmD and provider.   Interventions: 1:1 collaboration with Gwenlyn Fudge, FNP regarding development and update of comprehensive plan of care as evidenced by provider attestation and co-signature Inter-disciplinary care team collaboration (see longitudinal plan of care) Comprehensive medication review performed; medication list updated in electronic medical record  Diabetes: Goal on Track (progressing): YES. "Uncontrolled"- A1C 7.8-->7.0%,  A1C practically at goal for patient, however still having HYPERglycemia and HYPOglycemia Patient reports blood sugar is running higher post-rotator cuff suergey Increased pain & diet not as well controlled since she is unable to prepare meals at this time She will work to increase meal time insulin with heavier carb meals to cover the highs Current treatment: Tresiba 25 units AT BEDTIME, Humulin 70/30 mix pen (10-30 units with each meal), Onglyza 5mg , Farxiga 10mg , metformin; Humulin 70/30 mix pen is familiar to her since her husband is also on--we have tried x2 to transition her over to basal/bolus, but this has been unsuccessful.  Patient resorts back to previous regimen if she begins to run high (concern for hypoglycemia/overlap with mix+basal+other meds) Noted intolerance to Trulicity (may consider Rybelsus, low  dose ozempic, mounjaro to decrease insulin requirements) Denies personal and family history of Medullary thyroid cancer (MTC) Continue Tresiba 25 units  Continue mix insulin 70/30 to 10-30 units --patient doing 2-3 times daily (COUNSELED PATIENT TO BACK DOWN on MEAL TIME INSULIN WHEN EATING LESS--THIS IS USUALLY AT LUNCH TIME (only do 10units); DO NOT WAIT MORE THE 4 HOURS TO EAT DINNER; USE LIBRE TO GUIDE YOUR TREATMENT AND FOOD DECISIONS) Would like to have patient on rapid acting insulin only (not mix)--this has not worked out 02-19-1971 about hypo events-->patient denies hypoglycemia in the past 2 weeks; counseled on administration of insulin and eating 3 steady meals daily (she was waiting to late to eat dinner and going low) Working to get 02-19-1971 CGM 2 (cost is $35/month) Continue ongylza (free via az&me patient assistance program) Continue farxiga (free via az&me patient assistance program) Continue metformin Current glucose readings: fasting glucose: <200, post prandial glucose: 200s Reports hypoglycemia has improved/no events x4 weeks The patient is asked to make an attempt to improve diet and exercise patterns to aid in medical management of this problem. Discussed meal planning options and Plate method for healthy eating Avoid sugary drinks and desserts Incorporate balanced protein, non starchy veggies, 1 serving of carbohydrate with each meal Increase water intake Increase physical activity as able Current exercise: n/a-->encouraged Recommended heart healthy diet, healthy plate method handout given; work on transition DM regimen Assessed patient finances. Patient approved for Az&me patient assistance for Farxiga and Onglyza (sub for januvia-easier to get via patient assistance)--patient approved until 12/31/21, Humalog mix insulin 70/30--free via [employer] patient assistance  Hyperlipidemia:  Goal on Track (progressing): YES. Uncontrolled-LDL 78 current treatment: rosuvastatin  10mg  daily;  Medications previously tried: n/a  Current dietary patterns: heart healthy diet, healthy plate method handout  given Counseled on diet, may increase statin if not at goal with A1c control and lifestyle changes  Hypertension: Patient at goal <130/80 BP home report-ranges from 110s-130s/65-80 Continue current regimen--on ACEi  Patient Goals/Self-Care Activities patient will:  - take medications as prescribed as evidenced by patient report and record review check glucose DAILY OR IF SYMPTOMATIC, document, and provide at future appointments collaborate with provider on medication access solutions target a minimum of 150 minutes of moderate intensity exercise weekly engage in dietary modifications by FOLLOWING HEALTHY PLATE METHOD/HEART HEALTHY DIET   Plan: F/U 1 MONTH  Signature Kieth Brightly, PharmD, BCPS Clinical Pharmacist, [facility] [place]  II Phone [phone]   Please call the care guide team at [phone] if you need to cancel or reschedule your appointment.   The patient verbalized understanding of instructions, educational materials, and care plan provided today and DECLINED offer to receive copy of patient instructions, educational materials, and care plan.

## 2021-07-03 ENCOUNTER — Encounter: Payer: Medicare Other | Admitting: Physical Therapy

## 2021-07-05 ENCOUNTER — Ambulatory Visit: Payer: Medicare Other | Attending: Orthopedic Surgery | Admitting: Physical Therapy

## 2021-07-05 ENCOUNTER — Encounter: Payer: Self-pay | Admitting: Physical Therapy

## 2021-07-05 DIAGNOSIS — M6281 Muscle weakness (generalized): Secondary | ICD-10-CM | POA: Insufficient documentation

## 2021-07-05 DIAGNOSIS — M25611 Stiffness of right shoulder, not elsewhere classified: Secondary | ICD-10-CM | POA: Diagnosis not present

## 2021-07-05 DIAGNOSIS — M25511 Pain in right shoulder: Secondary | ICD-10-CM | POA: Insufficient documentation

## 2021-07-05 DIAGNOSIS — G8929 Other chronic pain: Secondary | ICD-10-CM | POA: Diagnosis not present

## 2021-07-05 NOTE — Therapy (Addendum)
OUTPATIENT PHYSICAL THERAPY SHOULDER TREATMENT   Patient Name: Sara Stuart MRN: [id number] DOB:[birth year], 66 y.o., female Today's Date: 07/06/2021   PT End of Session - 07/05/21 1119     Visit Number 2    Number of Visits 12    Date for PT Re-Evaluation 08/10/21    Authorization Type FOTO AT LEAST EVERY 5TH VISIT.  PROGRESS NOTE AT 10TH VISIT.  KX MODIFIER AFTER 15 VISITS.    PT Start Time 1119    PT Stop Time 1203    PT Time Calculation (min) 44 min    Activity Tolerance Patient tolerated treatment well    Behavior During Therapy WFL for tasks assessed/performed             Past Medical History:  Diagnosis Date   Arthritis    Chronic back pain    spondylolishthesis   DDD (degenerative disc disease), lumbosacral    Diabetes mellitus without complication (HCC) diagnosed in 02/2014   takes Metformin and Glipizide daily   GERD (gastroesophageal reflux disease)    takes Omeprazole daily   Gout    takes Colchicine daily as needed   Hypertension    takes Dyazide and Diltiazem daily   Nocturia    Polio    PONV (postoperative nausea and vomiting)    Wears glasses    Past Surgical History:  Procedure Laterality Date   BACK SURGERY  07/2012   bladder tacked      COLONOSCOPY     FOOT SURGERY     more than 5 surgeries on right foot related to polio   SHOULDER ARTHROSCOPY WITH ROTATOR CUFF REPAIR Right 06/15/2021   Procedure: SHOULDER ARTHROSCOPY WITH ROTATOR CUFF REPAIR;  Surgeon: Oliver Barre, MD;  Location: [facility];  Service: Orthopedics;  Laterality: Right;   surgery for polio     states x 5    TOTAL ABDOMINAL HYSTERECTOMY  2002   TUBAL PREGNANCY   TUBAL LIGATION     Patient Active Problem List   Diagnosis Date Noted   Aortic atherosclerosis (HCC) 11/01/2020   Pain of right upper extremity 06/28/2020   Insomnia 12/18/2019   Coronary artery disease involving native coronary artery of native heart without angina pectoris 02/03/2019   Mixed hyperlipidemia 10/10/2017    Uncontrolled type 2 diabetes mellitus with hyperglycemia, with long-term current use of insulin (HCC) 03/14/2017   Essential hypertension, benign 03/14/2017   GERD (gastroesophageal reflux disease) 12/29/2012   History of gout 12/29/2012   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   Morbid obesity (HCC) 12/29/2012   Degenerative spondylolisthesis 07/08/2012   Spinal stenosis, lumbar region, with neurogenic claudication 07/08/2012    REFERRING PROVIDER: Thane Edu MD.  REFERRING DIAG: S/p complete tear of rotator cuff.  THERAPY DIAG:  Chronic right shoulder pain  Stiffness of right shoulder, not elsewhere classified  Muscle weakness (generalized)  Rationale for Evaluation and Treatment Rehabilitation  ONSET DATE: 06/15/21 (surgery date).  SUBJECTIVE:  SUBJECTIVE STATEMENT:  Sleeping in her recliner and ready to get back in her bed.   PERTINENT HISTORY: OA, DDD, Back surgery.  PAIN:  Are you having pain? Yes: NPRS scale: 5/10 Pain location: Right shoulder and biceps. Pain description: Ache, throb. Aggravating factors: Movement. Relieving factors: Rest.  PRECAUTIONS: Other: Progress per RTC protocol.  WEIGHT BEARING RESTRICTIONS  No UE weight bearing.  FALLS:  Has patient fallen in last 6 months? No  PATIENT GOALS :  Use right UE without pain.                                     Manual Therapy Passive ROM: PROM of L shoulder, firm end feels and smooth arc of motion; flexion and ER; frequent oscillations to promote relaxation     ASSESSMENT:  CLINICAL IMPRESSION: Patient presented in clinic with reports of mild R shoulder pain. Patient had sling donned to RUE with sling donned. Patient required intermittant oscillations to promote relaxation as shoulder elevation noted in supine  position. Firm end feels and smooth arc of motion noted during PROM. Normal vasopneumatic response noted following removal of the modality.   OBJECTIVE IMPAIRMENTS decreased activity tolerance,pain, decreased range of motion.  ACTIVITY LIMITATIONS carrying, lifting, other.  PARTICIPATION LIMITATIONS: meal prep, cleaning, and other.  REHAB POTENTIAL: Excellent  CLINICAL DECISION MAKING: Stable/uncomplicated    GOALS: Goals reviewed with patient? Yes    LONG TERM GOALS: Target date: 08/16/2021    Independent with a HEP. Baseline:  Goal status: INITIAL 35 degrees so the patient can easily reach overhead. Baseline:  Goal status: INITIAL  3.  Active ER to 70 degrees+ to allow for easily donning/doffing of apparel. Baseline:  Goal status: INITIAL  4.  Increase ROM so patient is able to reach behind back to L4. Baseline:  Goal status: INITIAL 5.  Perform ADL's with pain not > 3/10. Baseline:  Goal status: INITIAL   PLAN:2 to 3 times a week for 6 weeks. PT DURATION: 6 weeks  PLANNED INTERVENTIONS: Therapeutic exercises, Therapeutic activity, Neuromuscular re-education, Patient/Family education, Joint mobilization, Electrical stimulation, Vasopneumatic device, and Ultrasound  PLAN FOR NEXT SESSION: Begin with gentle right shoulder PROM.  Vasopneumatic on low with pillow between thorax and elbow.   Marvell Fuller, PTA 07/06/21 10:56 AM

## 2021-07-06 ENCOUNTER — Encounter: Payer: Medicare Other | Admitting: *Deleted

## 2021-07-10 ENCOUNTER — Ambulatory Visit: Payer: Medicare Other | Admitting: Physical Therapy

## 2021-07-10 ENCOUNTER — Encounter: Payer: Self-pay | Admitting: Physical Therapy

## 2021-07-10 DIAGNOSIS — M6281 Muscle weakness (generalized): Secondary | ICD-10-CM | POA: Diagnosis not present

## 2021-07-10 DIAGNOSIS — M25611 Stiffness of right shoulder, not elsewhere classified: Secondary | ICD-10-CM | POA: Diagnosis not present

## 2021-07-10 DIAGNOSIS — M25511 Pain in right shoulder: Secondary | ICD-10-CM | POA: Diagnosis not present

## 2021-07-10 DIAGNOSIS — G8929 Other chronic pain: Secondary | ICD-10-CM

## 2021-07-10 NOTE — Therapy (Signed)
OUTPATIENT PHYSICAL THERAPY SHOULDER TREATMENT   Patient Name: Sara Stuart MRN: [id number] DOB:Apr 16, 1955, 66 y.o., female Today's Date: 07/10/2021   PT End of Session - 07/10/21 1505     Visit Number 3    Number of Visits 12    Date for PT Re-Evaluation 08/10/21    Authorization Type FOTO AT LEAST EVERY 5TH VISIT.  PROGRESS NOTE AT 10TH VISIT.  KX MODIFIER AFTER 15 VISITS.    PT Start Time 0230    PT Stop Time 0316    PT Time Calculation (min) 46 min    Activity Tolerance Patient tolerated treatment well    Behavior During Therapy WFL for tasks assessed/performed             Past Medical History:  Diagnosis Date   Arthritis    Chronic back pain    spondylolishthesis   DDD (degenerative disc disease), lumbosacral    Diabetes mellitus without complication (HCC) diagnosed in 02/2014   takes Metformin and Glipizide daily   GERD (gastroesophageal reflux disease)    takes Omeprazole daily   Gout    takes Colchicine daily as needed   Hypertension    takes Dyazide and Diltiazem daily   Nocturia    Polio    PONV (postoperative nausea and vomiting)    Wears glasses    Past Surgical History:  Procedure Laterality Date   BACK SURGERY  07/2012   bladder tacked      COLONOSCOPY     FOOT SURGERY     more than 5 surgeries on right foot related to polio   SHOULDER ARTHROSCOPY WITH ROTATOR CUFF REPAIR Right 06/15/2021   Procedure: SHOULDER ARTHROSCOPY WITH ROTATOR CUFF REPAIR;  Surgeon: Oliver Barre, MD;  Location: [facility];  Service: Orthopedics;  Laterality: Right;   surgery for polio     states x 5    TOTAL ABDOMINAL HYSTERECTOMY  2002   TUBAL PREGNANCY   TUBAL LIGATION     Patient Active Problem List   Diagnosis Date Noted   Aortic atherosclerosis (HCC) 11/01/2020   Pain of right upper extremity 06/28/2020   Insomnia 12/18/2019   Coronary artery disease involving native coronary artery of native heart without angina pectoris 02/03/2019   Mixed hyperlipidemia  10/10/2017   Uncontrolled type 2 diabetes mellitus with hyperglycemia, with long-term current use of insulin (HCC) 03/14/2017   Essential hypertension, benign 03/14/2017   GERD (gastroesophageal reflux disease) 12/29/2012   History of gout 12/29/2012   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   Morbid obesity (HCC) 12/29/2012   Degenerative spondylolisthesis 07/08/2012   Spinal stenosis, lumbar region, with neurogenic claudication 07/08/2012    REFERRING PROVIDER: Thane Edu MD.  REFERRING DIAG: S/p complete tear of rotator cuff.  THERAPY DIAG:  Chronic right shoulder pain  Stiffness of right shoulder, not elsewhere classified  Muscle weakness (generalized)  Rationale for Evaluation and Treatment Rehabilitation  ONSET DATE: 06/15/21 (surgery date).  SUBJECTIVE:  SUBJECTIVE STATEMENT:  Was putting a a bunch of water bottle in refrigerator when it slipped and she reached out with right to prevent them from falling and it hurt.  Pain low today.   PERTINENT HISTORY: OA, DDD, Back surgery.  PAIN:  Are you having pain? Yes: NPRS scale: 2/10 Pain location: Right shoulder and biceps. Pain description: Ache, throb. Aggravating factors: Movement. Relieving factors: Rest.  PRECAUTIONS: Other: Progress per RTC protocol.  WEIGHT BEARING RESTRICTIONS  No UE weight bearing.  FALLS:  Has patient fallen in last 6 months? No  PATIENT GOALS :  Use right UE without pain.                                     Manual Therapy Passive ROM: PROM of L shoulder, 23 minutes with low load long duration stretching technique utilized.  Low-level non-motoric IFC to right shoulder/biceps region at 80-150 Hz on 40% scan x 15 minutes.     ASSESSMENT:  CLINICAL IMPRESSION: Patient with flare-up last Friday when trying  to prevent water bottles from falling.  Her pain was a low 2/10 upon presentation to the clinic today.  She enjoyed low-level non-motoric IFC today.  Her ROM is improving nicely.   OBJECTIVE IMPAIRMENTS decreased activity tolerance,pain, decreased range of motion.  ACTIVITY LIMITATIONS carrying, lifting, other.  PARTICIPATION LIMITATIONS: meal prep, cleaning, and other.  REHAB POTENTIAL: Excellent  CLINICAL DECISION MAKING: Stable/uncomplicated    GOALS: Goals reviewed with patient? Yes    LONG TERM GOALS: Target date: 08/16/2021    Independent with a HEP. Baseline:  Goal status: INITIAL 35 degrees so the patient can easily reach overhead. Baseline:  Goal status: INITIAL  3.  Active ER to 70 degrees+ to allow for easily donning/doffing of apparel. Baseline:  Goal status: INITIAL  4.  Increase ROM so patient is able to reach behind back to L4. Baseline:  Goal status: INITIAL 5.  Perform ADL's with pain not > 3/10. Baseline:  Goal status: INITIAL   PLAN:2 to 3 times a week for 6 weeks. PT DURATION: 6 weeks  PLANNED INTERVENTIONS: Therapeutic exercises, Therapeutic activity, Neuromuscular re-education, Patient/Family education, Joint mobilization, Electrical stimulation, Vasopneumatic device, and Ultrasound  PLAN FOR NEXT SESSION: Begin with gentle right shoulder PROM.  Vasopneumatic on low with pillow between thorax and elbow.   Marvell Fuller, PTA 07/10/21 3:18 PM

## 2021-07-12 ENCOUNTER — Other Ambulatory Visit: Payer: Self-pay | Admitting: Family Medicine

## 2021-07-13 ENCOUNTER — Ambulatory Visit: Payer: Medicare Other | Admitting: Physical Therapy

## 2021-07-13 ENCOUNTER — Encounter: Payer: Self-pay | Admitting: Physical Therapy

## 2021-07-13 DIAGNOSIS — M25511 Pain in right shoulder: Secondary | ICD-10-CM | POA: Diagnosis not present

## 2021-07-13 DIAGNOSIS — M6281 Muscle weakness (generalized): Secondary | ICD-10-CM

## 2021-07-13 DIAGNOSIS — G8929 Other chronic pain: Secondary | ICD-10-CM

## 2021-07-13 DIAGNOSIS — M25611 Stiffness of right shoulder, not elsewhere classified: Secondary | ICD-10-CM | POA: Diagnosis not present

## 2021-07-13 NOTE — Therapy (Signed)
OUTPATIENT PHYSICAL THERAPY SHOULDER TREATMENT   Patient Name: Sara Stuart MRN: [id number] DOB:1955-07-10, 66 y.o., female Today's Date: 07/13/2021   PT End of Session - 07/13/21 1032     Visit Number 4    Number of Visits 12    Date for PT Re-Evaluation 08/10/21    Authorization Type FOTO AT LEAST EVERY 5TH VISIT.  PROGRESS NOTE AT 10TH VISIT.  KX MODIFIER AFTER 15 VISITS.    PT Start Time 1035    PT Stop Time 1110    PT Time Calculation (min) 35 min    Activity Tolerance Patient tolerated treatment well    Behavior During Therapy WFL for tasks assessed/performed             Past Medical History:  Diagnosis Date   Arthritis    Chronic back pain    spondylolishthesis   DDD (degenerative disc disease), lumbosacral    Diabetes mellitus without complication (HCC) diagnosed in 02/2014   takes Metformin and Glipizide daily   GERD (gastroesophageal reflux disease)    takes Omeprazole daily   Gout    takes Colchicine daily as needed   Hypertension    takes Dyazide and Diltiazem daily   Nocturia    Polio    PONV (postoperative nausea and vomiting)    Wears glasses    Past Surgical History:  Procedure Laterality Date   BACK SURGERY  07/2012   bladder tacked      COLONOSCOPY     FOOT SURGERY     more than 5 surgeries on right foot related to polio   SHOULDER ARTHROSCOPY WITH ROTATOR CUFF REPAIR Right 06/15/2021   Procedure: SHOULDER ARTHROSCOPY WITH ROTATOR CUFF REPAIR;  Surgeon: Oliver Barre, MD;  Location: [facility];  Service: Orthopedics;  Laterality: Right;   surgery for polio     states x 5    TOTAL ABDOMINAL HYSTERECTOMY  2002   TUBAL PREGNANCY   TUBAL LIGATION     Patient Active Problem List   Diagnosis Date Noted   Aortic atherosclerosis (HCC) 11/01/2020   Pain of right upper extremity 06/28/2020   Insomnia 12/18/2019   Coronary artery disease involving native coronary artery of native heart without angina pectoris 02/03/2019   Mixed hyperlipidemia  10/10/2017   Uncontrolled type 2 diabetes mellitus with hyperglycemia, with long-term current use of insulin (HCC) 03/14/2017   Essential hypertension, benign 03/14/2017   GERD (gastroesophageal reflux disease) 12/29/2012   History of gout 12/29/2012   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   Morbid obesity (HCC) 12/29/2012   Degenerative spondylolisthesis 07/08/2012   Spinal stenosis, lumbar region, with neurogenic claudication 07/08/2012    REFERRING PROVIDER: Thane Edu MD.  REFERRING DIAG: S/p complete tear of rotator cuff.  THERAPY DIAG:  Chronic right shoulder pain  Stiffness of right shoulder, not elsewhere classified  Muscle weakness (generalized)  Rationale for Evaluation and Treatment Rehabilitation  ONSET DATE: 06/15/21 (surgery date).  SUBJECTIVE:  SUBJECTIVE STATEMENT:  Was putting a a bunch of water bottle in refrigerator when it slipped and she reached out with right to prevent them from falling and it hurt.  Pain low today.   PERTINENT HISTORY: OA, DDD, Back surgery.  PAIN:  Are you having pain? Yes: NPRS scale: 2/10 Pain location: Right shoulder and biceps. Pain description: Ache, throb. Aggravating factors: Movement. Relieving factors: Rest.  PRECAUTIONS: Other: Progress per RTC protocol.  WEIGHT BEARING RESTRICTIONS  No UE weight bearing.  FALLS:  Has patient fallen in last 6 months? No  PATIENT GOALS :  Use right UE without pain.   Manual Therapy Passive ROM: R shoulder, flex and ER; smooth arc of motion and firm end feel noted during PROM    Modalities  Date:  Vaso: Shoulder, low, 10 mins, Pain   ASSESSMENT:  CLINICAL IMPRESSION: Patient presented in clinic with mild to mod discomfort of the R shoulder. Patient denied any new bruising or continued pain  after the water bottle incident. Patient able to tolerate PROM well with few intermittant oscillations to reduce guarding. Firm end feels and smooth arc of motion noted during PROM. Normal vasopneumatic response noted following removal of the modality.   OBJECTIVE IMPAIRMENTS decreased activity tolerance,pain, decreased range of motion.  ACTIVITY LIMITATIONS carrying, lifting, other.  PARTICIPATION LIMITATIONS: meal prep, cleaning, and other.  REHAB POTENTIAL: Excellent  CLINICAL DECISION MAKING: Stable/uncomplicated  GOALS: Goals reviewed with patient? Yes    LONG TERM GOALS: Target date: 08/16/2021    Independent with a HEP. Baseline:  Goal status: INITIAL 35 degrees so the patient can easily reach overhead. Baseline:  Goal status: INITIAL  3.  Active ER to 70 degrees+ to allow for easily donning/doffing of apparel. Baseline:  Goal status: INITIAL  4.  Increase ROM so patient is able to reach behind back to L4. Baseline:  Goal status: INITIAL 5.  Perform ADL's with pain not > 3/10. Baseline:  Goal status: INITIAL   PLAN:2 to 3 times a week for 6 weeks. PT DURATION: 6 weeks  PLANNED INTERVENTIONS: Therapeutic exercises, Therapeutic activity, Neuromuscular re-education, Patient/Family education, Joint mobilization, Electrical stimulation, Vasopneumatic device, and Ultrasound  PLAN FOR NEXT SESSION: Begin with gentle right shoulder PROM.  Vasopneumatic on low with pillow between thorax and elbow.   Marvell Fuller, PTA 07/13/21 11:12 AM

## 2021-07-17 ENCOUNTER — Ambulatory Visit: Payer: Medicare Other | Admitting: Physical Therapy

## 2021-07-17 ENCOUNTER — Encounter: Payer: Self-pay | Admitting: Physical Therapy

## 2021-07-17 DIAGNOSIS — M25511 Pain in right shoulder: Secondary | ICD-10-CM | POA: Diagnosis not present

## 2021-07-17 DIAGNOSIS — M6281 Muscle weakness (generalized): Secondary | ICD-10-CM | POA: Diagnosis not present

## 2021-07-17 DIAGNOSIS — G8929 Other chronic pain: Secondary | ICD-10-CM | POA: Diagnosis not present

## 2021-07-17 DIAGNOSIS — M25611 Stiffness of right shoulder, not elsewhere classified: Secondary | ICD-10-CM | POA: Diagnosis not present

## 2021-07-17 NOTE — Therapy (Addendum)
OUTPATIENT PHYSICAL THERAPY SHOULDER TREATMENT   Patient Name: Sara Stuart MRN: [id number] DOB:[birth year], 66 y.o., female Today's Date: 07/17/2021   PT End of Session - 07/17/21 1342     Visit Number 5    Number of Visits 12    Date for PT Re-Evaluation 08/10/21    Authorization Type FOTO AT LEAST EVERY 5TH VISIT.  PROGRESS NOTE AT 10TH VISIT.  KX MODIFIER AFTER 15 VISITS.    PT Start Time 0100    PT Stop Time 0140    PT Time Calculation (min) 40 min    Activity Tolerance Patient tolerated treatment well    Behavior During Therapy WFL for tasks assessed/performed              Past Medical History:  Diagnosis Date   Arthritis    Chronic back pain    spondylolishthesis   DDD (degenerative disc disease), lumbosacral    Diabetes mellitus without complication ([place]) diagnosed in 02/2014   takes Metformin and Glipizide daily   GERD (gastroesophageal reflux disease)    takes Omeprazole daily   Gout    takes Colchicine daily as needed   Hypertension    takes Dyazide and Diltiazem daily   Nocturia    Polio    PONV (postoperative nausea and vomiting)    Wears glasses    Past Surgical History:  Procedure Laterality Date   BACK SURGERY  07/2012   bladder tacked      COLONOSCOPY     FOOT SURGERY     more than 5 surgeries on right foot related to polio   SHOULDER ARTHROSCOPY WITH ROTATOR CUFF REPAIR Right 06/15/2021   Procedure: SHOULDER ARTHROSCOPY WITH ROTATOR CUFF REPAIR;  Surgeon: Mordecai Rasmussen, MD;  Location: [facility];  Service: Orthopedics;  Laterality: Right;   surgery for polio     states x 5    TOTAL ABDOMINAL HYSTERECTOMY  2002   TUBAL PREGNANCY   TUBAL LIGATION     Patient Active Problem List   Diagnosis Date Noted   Aortic atherosclerosis ([place]) 11/01/2020   Pain of right upper extremity 06/28/2020   Insomnia 12/18/2019   Coronary artery disease involving native coronary artery of native heart without angina pectoris 02/03/2019   Mixed hyperlipidemia  10/10/2017   Uncontrolled type 2 diabetes mellitus with hyperglycemia, with long-term current use of insulin ([place]) 03/14/2017   Essential hypertension, benign 03/14/2017   GERD (gastroesophageal reflux disease) 12/29/2012   History of gout 12/29/2012   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   Morbid obesity ([place]) 12/29/2012   Degenerative spondylolisthesis 07/08/2012   Spinal stenosis, lumbar region, with neurogenic claudication 07/08/2012    REFERRING PROVIDER: Larena Glassman MD.  REFERRING DIAG: S/p complete tear of rotator cuff.  THERAPY DIAG:  Chronic right shoulder pain  Stiffness of right shoulder, not elsewhere classified  Muscle weakness (generalized)  Rationale for Evaluation and Treatment Rehabilitation  ONSET DATE: 06/15/21 (surgery date).  SUBJECTIVE:  SUBJECTIVE STATEMENT:  Been out of sling some at home.   PERTINENT HISTORY: OA, DDD, Back surgery.  PAIN:  Are you having pain? Yes: NPRS scale: 2/10 Pain location: Right shoulder and biceps. Pain description: Ache, throb. Aggravating factors: Movement. Relieving factors: Rest.  PRECAUTIONS: Other: Progress per RTC protocol.  WEIGHT BEARING RESTRICTIONS  No UE weight bearing.  FALLS:  Has patient fallen in last 6 months? No  PATIENT GOALS :  Use right UE without pain.   Manual Therapy Passive ROM: R shoulder, flex and ER; smooth arc of motion and firm end feel noted during PROM  x 23 minutes.  Modalities HMP x 10 minutes to patient's right shoulder.   ASSESSMENT:  CLINICAL IMPRESSION:    HEP: [place] by [place] Uriyah Massimo Jul 17th, 2023 View at [email or web address] using code: [email or web address] Total 2 Page 1 of 1 WAND EXTERNAL ROTATION - SUPINE ER Lie on your back holding a cane or wand with  both hands. On the affected side, place a small rolled up towel or pillow under your elbow. Maintain approx. 90 degree bend at the elbow with your arm approximately 30-45 degrees away from your side. GENTLE. PAIN-FREE Use your other arm to pull the wand/cane to rotate the affected arm back into a stretch. Hold and then return to starting position and then repeat. Repeat 10 Times Hold 15 Seconds Complete 1 Set Perform 4 Times a Day Closed chain flexion stretch Step 1: Stand facing table/countertop with both hands on table/countertop. Step 2: Keep hands on table/countertop and step back away from table/countertop to stretch the shoulders. DO NOT WEIGHT BEAR THROUGH AFFECTED UPPER EXTREMITY. GENTLE AND PAIN-FREE. Repeat 10 Times Hold 10 Seconds Complete 1 Set Perform 4 Times a Day  GOALS: Goals reviewed with patient? Yes    LONG TERM GOALS: Target date: 08/16/2021    Independent with a HEP. Baseline:  Goal status: INITIAL 35 degrees so the patient can easily reach overhead. Baseline:  Goal status: INITIAL  3.  Active ER to 70 degrees+ to allow for easily donning/doffing of apparel. Baseline:  Goal status: INITIAL  4.  Increase ROM so patient is able to reach behind back to L4. Baseline:  Goal status: INITIAL 5.  Perform ADL's with pain not > 3/10. Baseline:  Goal status: INITIAL   PLAN:2 to 3 times a week for 6 weeks. PT DURATION: 6 weeks  PLANNED INTERVENTIONS: Therapeutic exercises, Therapeutic activity, Neuromuscular re-education, Patient/Family education, Joint mobilization, Electrical stimulation, Vasopneumatic device, and Ultrasound  PLAN FOR NEXT SESSION: Begin with gentle right shoulder PROM.  Vasopneumatic on low with pillow between thorax and elbow.   Standley Brooking, PTA 07/17/21 1:44 PM   PHYSICAL THERAPY DISCHARGE SUMMARY  Visits from Start of Care: 5.  Current functional level related to goals / functional outcomes: See above.   Remaining  deficits: See below.   Education / Equipment: HEP.   Patient agrees to discharge. Patient goals were not met. Patient is being discharged due to not returning since the last visit.    Mali Jahna Liebert MPT

## 2021-07-18 ENCOUNTER — Other Ambulatory Visit: Payer: Self-pay | Admitting: Family Medicine

## 2021-07-21 ENCOUNTER — Ambulatory Visit (INDEPENDENT_AMBULATORY_CARE_PROVIDER_SITE_OTHER): Payer: Medicare Other | Admitting: Pharmacist

## 2021-07-21 ENCOUNTER — Ambulatory Visit (INDEPENDENT_AMBULATORY_CARE_PROVIDER_SITE_OTHER): Payer: Medicare Other | Admitting: Orthopedic Surgery

## 2021-07-21 ENCOUNTER — Encounter: Payer: Self-pay | Admitting: Orthopedic Surgery

## 2021-07-21 DIAGNOSIS — G720 Drug-induced myopathy: Secondary | ICD-10-CM

## 2021-07-21 DIAGNOSIS — E1165 Type 2 diabetes mellitus with hyperglycemia: Secondary | ICD-10-CM

## 2021-07-21 DIAGNOSIS — E782 Mixed hyperlipidemia: Secondary | ICD-10-CM

## 2021-07-21 DIAGNOSIS — Z9889 Other specified postprocedural states: Secondary | ICD-10-CM

## 2021-07-21 MED ORDER — SAXAGLIPTIN HCL 5 MG PO TABS: 5.0000 mg | ORAL_TABLET | Freq: Every day | ORAL | 4 refills | Status: DC

## 2021-07-21 MED ORDER — DAPAGLIFLOZIN PROPANEDIOL 10 MG PO TABS: 10.0000 mg | ORAL_TABLET | Freq: Every day | ORAL | 4 refills | Status: DC

## 2021-07-21 NOTE — Progress Notes (Signed)
Chronic Care Management Pharmacy Note  07/21/2021 Name:  Sara Stuart MRN:  [id number] DOB:  [birth year]  Summary:  Diabetes: Goal on Track (progressing): YES. "Uncontrolled"- A1C 7.8-->7.0%,  A1C practically at goal for patient, however still having HYPERglycemia and HYPOglycemia Patient reports blood sugar is running higher post-rotator cuff surgery Increased pain & diet not as well controlled since she is unable to prepare meals at this time She will work to increase meal time insulin with heavier carb meals to cover the highs Current treatment: Tresiba 25 units AT BEDTIME, Humulin 70/30 mix pen (10-30 units with each meal), Onglyza 79m, Farxiga 115m metformin; Humulin 70/30 mix pen is familiar to her since her husband is also on--we have tried x2 to transition her over to basal/bolus, but this has been unsuccessful.  Patient resorts back to previous regimen if she begins to run high (concern for hypoglycemia/overlap with mix+basal+other meds) Noted intolerance to Trulicity (may consider Rybelsus, low dose ozempic, mounjaro to decrease insulin requirements) Denies personal and family history of Medullary thyroid cancer (MTC) Continue Tresiba 25 units  Continue mix insulin 70/30 to 10-30 units --patient doing 2-3 times daily (COUNSELED PATIENT TO BACK DOWN on MEAL TIME INSULIN WHEN EATING LESS--THIS IS USUALLY AT LUNCH TIME (only do 10units); DO NOT WAIT MORE THE 4 HOURS TO EAT DINNER; USE LIBRE TO GUIDE YOUR TREATMENT AND FOOD DECISIONS) Would like to have patient on rapid acting insulin only (not mix)--this has not worked out x[employer] hypo events-->patient denies hypoglycemia in the past 2 weeks; counseled on administration of insulin and eating 3 steady meals daily (she was waiting to late to eat dinner and going low) Working to get liEnergy [employer]GM 2 (cost is $35/month) Continue ongylza (free via az&me patient assistance program) Continue farxiga (free via az&me patient assistance  program) Continue metformin Current glucose readings: fasting glucose: <200, post prandial glucose: 200s Reports hypoglycemia has improved/no events x6 weeks The patient is asked to make an attempt to improve diet and exercise patterns to aid in medical management of this problem. Discussed meal planning options and Plate method for healthy eating Avoid sugary drinks and desserts Incorporate balanced protein, non starchy veggies, 1 serving of carbohydrate with each meal Increase water intake Increase physical activity as able Current exercise: n/a-->encouraged Recommended heart healthy diet, healthy plate method handout given; work on transition DM regimen Assessed patient finances. Patient approved for Az[employer]me patient assistance for Farxiga and Onglyza (sub for januvia-easier to get via patient assistance)--patient approved until 12/31/21, Humalog mix insulin 70/30--free via [employer] patient assistance  Hyperlipidemia:  Goal on Track (progressing): YES. Controlled-LDL 78-->67 current treatment: rosuvastatin 1015maily; now at goal;some myopathy with higher dose statin Medications previously tried: n/a  Current dietary patterns: heart healthy diet, healthy plate method handout given Counseled on diet, may increase statin if not at goal with A1c control and lifestyle changes Lipid Panel     Component Value Date/Time   CHOL 146 05/10/2021 0857   TRIG 122 05/10/2021 0857   HDL 57 05/10/2021 0857   CHOLHDL 2.6 05/10/2021 0857   CHOLHDL 3.8 05/02/2017 1617   LDLCALC 67 05/10/2021 0857   LDLCALC 111 (H) 05/02/2017 1617   LABVLDL 22 05/10/2021 0857    Hypertension: Patient at goal <130/80 BP home report-ranges from 110s-130s/65-80 Continue current regimen--on ACEi  Patient Goals/Self-Care Activities patient will:  - take medications as prescribed as evidenced by patient report and record review check glucose DAILY OR IF SYMPTOMATIC, document, and provide at  future  appointments collaborate with provider on medication access solutions target a minimum of 150 minutes of moderate intensity exercise weekly engage in dietary modifications by FOLLOWING HEALTHY PLATE METHOD/HEART HEALTHY DIET   Subjective: Sara Stuart is an 66 y.o. year old female who is a primary patient of Loman Brooklyn, FNP.  The CCM team was consulted for assistance with disease management and care coordination needs.    Engaged with patient by telephone for follow up visit in response to provider referral for pharmacy case management and/or care coordination services.   Consent to Services:  The patient was given information about Chronic Care Management services, agreed to services, and gave verbal consent prior to initiation of services.  Please see initial visit note for detailed documentation.   Patient Care Team: Loman Brooklyn, FNP as PCP - General (Family Medicine) Lavera Guise, [facility] as Pharmacist (Family Medicine) Celestia Khat, OD (Optometry)  Objective:  Lab Results  Component Value Date   CREATININE 0.94 05/10/2021   CREATININE 0.87 02/09/2021   CREATININE 0.96 11/01/2020    Lab Results  Component Value Date   HGBA1C 7.0 (H) 05/10/2021   Last diabetic Eye exam:  Lab Results  Component Value Date/Time   HMDIABEYEEXA No Retinopathy 06/23/2020 12:00 AM    Last diabetic Foot exam: No results found for: "HMDIABFOOTEX"      Component Value Date/Time   CHOL 146 05/10/2021 0857   TRIG 122 05/10/2021 0857   HDL 57 05/10/2021 0857   CHOLHDL 2.6 05/10/2021 0857   CHOLHDL 3.8 05/02/2017 1617   LDLCALC 67 05/10/2021 0857   LDLCALC 111 (H) 05/02/2017 1617       Latest Ref Rng & Units 05/10/2021    8:57 AM 02/09/2021    2:36 PM 11/01/2020   10:22 AM  Hepatic Function  Total Protein 6.0 - 8.5 g/dL 6.9  7.3  7.0   Albumin 3.8 - 4.8 g/dL 4.4  4.5  4.2   AST 0 - 40 IU/L _0 ALT 0 - 32 IU/L _1 Alk Phosphatase 44 - 121 IU/L 79  82  80    Total Bilirubin 0.0 - 1.2 mg/dL 0.2  0.2  0.3     Lab Results  Component Value Date/Time   TSH 1.41 05/02/2017 04:17 PM   FREET4 1.1 05/02/2017 04:17 PM       Latest Ref Rng & Units 05/10/2021    8:57 AM 02/09/2021    2:36 PM 11/01/2020   10:22 AM  CBC  WBC 3.4 - 10.8 x10E3/uL 7.8  11.7  9.0   Hemoglobin 11.1 - 15.9 g/dL 13.7  14.1  13.3   Hematocrit 34.0 - 46.6 % 41.2  43.2  42.3   Platelets 150 - 450 x10E3/uL 286  314  306     Lab Results  Component Value Date/Time   VD25OH 22 (L) 05/02/2017 04:17 PM    Clinical ASCVD: No  The 10-year ASCVD risk score (Arnett DK, et al., 2019) is: 12.2%   Values used to calculate the score:     Age: 25 years     Sex: Female     Is Non-Hispanic African American: No     Diabetic: Yes     Tobacco smoker: No     Systolic Blood Pressure: 034 mmHg     Is BP treated: Yes     HDL Cholesterol: 57 mg/dL     Total Cholesterol: 146  mg/dL    Other: (CHADS2VASc if Afib, PHQ9 if depression, MMRC or CAT for COPD, ACT, DEXA)  Social History   Tobacco Use  Smoking Status Never  Smokeless Tobacco Never   BP Readings from Last 3 Encounters:  06/15/21 131/69  06/12/21 117/74  05/10/21 112/76   Pulse Readings from Last 3 Encounters:  06/15/21 67  06/12/21 80  05/10/21 64   Wt Readings from Last 3 Encounters:  06/15/21 203 lb 0.7 oz (92.1 kg)  06/12/21 203 lb (92.1 kg)  05/10/21 208 lb (94.3 kg)    Assessment: Review of patient past medical history, allergies, medications, health status, including review of consultants reports, laboratory and other test data, was performed as part of comprehensive evaluation and provision of chronic care management services.   SDOH:  (Social Determinants of Health) assessments and interventions performed:    CCM Care Plan  Allergies  Allergen Reactions   Trulicity [Dulaglutide] Other (See Comments)    GI intolerance    Medications Reviewed Today     Reviewed by Lavera Guise, [facility] (Pharmacist)  on 07/21/21 at 1425  Med List Status: <None>   Medication Order Taking? Sig Documenting Provider Last Dose Status Informant  aspirin EC 81 MG tablet [id number] No Take 1 tablet (81 mg total) by mouth daily. Orson Eva, MD Taking Active Self           Med Note [facility], [facility]   Thu Mar 14, 2017  2:26 PM)    baclofen (LIORESAL) 10 MG tablet [id number] No Take 1 tablet (10 mg total) by mouth 3 (three) times daily as needed. Loman Brooklyn, FNP Taking Active   Continuous Blood Gluc Receiver (FREESTYLE LIBRE 2 READER) DEVI [id number] No Use to test blood sugar continuously. DX: E11.65 Loman Brooklyn, FNP Taking Active            Med Note Blanca Friend, Royce Macadamia   Thu Jun 01, 2021 11:06 AM) SENT TO [employer] DI[employer]ETES SUPPLY VIA [place] PORTAL  Continuous Blood Gluc Sensor (FREESTYLE LIBRE 2 SENSOR) MISC [id number] No Use to test blood sugar continuously.  Replace sensor every 14 days. DX: E11.65 Loman Brooklyn, FNP Taking Active            Med Note Blanca Friend, Royce Macadamia   Thu Jun 01, 2021 11:07 AM) SENT TO [facility]V DIABETES SUPPLY VIA [place] PORTAL   dapagliflozin propanediol (FARXIGA) 10 MG TABS tablet [id number] No Take 1 tablet (10 mg total) by mouth daily before breakfast. Loman Brooklyn, FNP Taking Active            Med Note Blanca Friend, Marcellus Scott May 31, 2021  4:13 PM) Via AZ&me patient assistance program   diltiazem (DILT-XR) 120 MG 24 hr capsule [id number] No Take 1 capsule (120 mg total) by mouth daily. Loman Brooklyn, FNP Taking Active   fluticasone [facility]) 50 MCG/ACT nasal spray [id number] No Place 2 sprays into both nostrils daily as needed. [provider] Taking Active   gabapentin (NEURONTIN) 100 MG capsule [id number] No Take 1 capsule (100 mg total) by mouth 3 (three) times daily. Mordecai Rasmussen, MD Taking Active   glucose blood test strip [id number] No Test BS four times daily Dx E11.65 Ronnie Doss M, DO Taking Active   ibuprofen (ADVIL) 800 MG tablet [id number] No TAKE 1 TABLET BY  MOUTH EVERY 8 HOURS AS NEEDED FOR MODERATE PAIN Loman Brooklyn, FNP Taking Active   insulin degludec (TRESIBA FLEXTOUCH) 100 UNIT/ML  FlexTouch Pen [id number] No Inject 26 Units into the skin daily.  Patient taking differently: Inject 20-26 Units into the skin daily.   Loman Brooklyn, FNP Taking Active            Med Note [facility], Avel Peace   Thu Jun 15, 2021  6:38 AM) Evelina Bucy only 1/2 dose 15 units   insulin isophane & regular human KwikPen (HUMULIN 70/30 KWIKPEN) (70-30) 100 UNIT/ML KwikPen [id number] No Inject 35 Units into the skin with breakfast, with lunch, and with evening meal.  Patient taking differently: Inject 20-35 Units into the skin with breakfast, with lunch, and with evening meal.   Loman Brooklyn, FNP Taking Active   Insulin Pen Needle (B-D ULTRAFINE III SHORT PEN) 31G X 8 MM MISC [id number] No 1 each by Does not apply route as directed. Cassandria Anger, MD Taking Active Self  lisinopril (ZESTRIL) 2.5 MG tablet [id number] No Take 1 tablet (2.5 mg total) by mouth daily. Loman Brooklyn, FNP Taking Active   metFORMIN (GLUCOPHAGE-XR) 500 MG 24 hr tablet [id number] No Take 2 tablets (1,000 mg total) by mouth 2 (two) times daily. Loman Brooklyn, FNP Taking Active   metoprolol tartrate (LOPRESSOR) 25 MG tablet [id number] No Take 1 tablet (25 mg total) by mouth daily. Loman Brooklyn, FNP Taking Active   nitroGLYCERIN (NITROSTAT) 0.4 MG SL tablet [id number] No DISSOLVE ONE TABLET UNDER THE TONGUE EVERY 5 MINUTES AS NEEDED FOR CHEST PAIN.  DO NOT EXCEED A TOTAL OF 3 DOSES IN 15 MINUTES Fay Records, MD Taking Active   omeprazole (PRILOSEC) 40 MG capsule [id number] No Take 1 capsule by mouth once daily Loman Brooklyn, FNP Taking Active   rosuvastatin (CRESTOR) 10 MG tablet [id number] No Take 1 tablet by mouth once daily Evelina Dun A, FNP Taking Active   saxagliptin HCl (ONGLYZA) 5 MG TABS tablet [id number] No Take 1 tablet (5 mg total) by mouth daily. Loman Brooklyn, FNP Taking  Active            Med Note Blanca Friend, Sherian Maroon Jun 01, 2021 11:07 AM) Via AZ&me patient assistance program   spironolactone (ALDACTONE) 25 MG tablet [id number] No Take 1/2 (one-half) tablet by mouth once daily Loman Brooklyn, FNP Taking Active   traZODone (DESYREL) 50 MG tablet [id number] No TAKE 1 TABLET BY MOUTH AT BEDTIME Hendricks Limes F, FNP Taking Active   triamterene-hydrochlorothiazide (DYAZIDE) 37.5-25 MG capsule [id number] No Take 1 each (1 capsule total) by mouth daily. Loman Brooklyn, FNP Taking Active   Med List Note Lavera Guise [facility] 06/01/21 1127): Prescription assistance for Onglyza and Farxiga--ESCRIBE TO [place] (AZ&ME PATIENT ASSISTANCE) Prescription assistance for HUMALOG KWIKPEN 70/30--ESCRIBE TO [facility] [place] ([employer] patient assistance)             Patient Active Problem List   Diagnosis Date Noted   Aortic atherosclerosis ([place]) 11/01/2020   Pain of right upper extremity 06/28/2020   Insomnia 12/18/2019   Coronary artery disease involving native coronary artery of native heart without angina pectoris 02/03/2019   Mixed hyperlipidemia 10/10/2017   Uncontrolled type 2 diabetes mellitus with hyperglycemia, with long-term current use of insulin ([place]) 03/14/2017   Essential hypertension, benign 03/14/2017   GERD (gastroesophageal reflux disease) 12/29/2012   History of gout 12/29/2012   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   Morbid obesity ([place]) 12/29/2012   Degenerative spondylolisthesis 07/08/2012   Spinal stenosis, lumbar region, with neurogenic  claudication 07/08/2012    Immunization History  Administered Date(s) Administered   Influenza, High Dose Seasonal PF 10/22/2020   Influenza, Quadrivalent, Recombinant, Inj, Pf 09/18/2018   Influenza,inj,Quad PF,6+ Mos 09/21/2017   Influenza,inj,quad, With Preservative 01/05/2017, 09/21/2017   Influenza,trivalent, recombinat, inj, PF 10/02/2014    Influenza-Unspecified 09/18/2018, 10/02/2019, 10/27/2020   Moderna Sars-Covid-2 Vaccination 11/03/2019, 12/03/2019, 06/16/2020   PNEUMOCOCCAL CONJUGATE-20 11/01/2020   Pneumococcal Polysaccharide-23 09/10/2017   Tdap 01/07/2018   Zoster Recombinat (Shingrix) 02/09/2021    Conditions to be addressed/monitored: HTN, HLD, and DMII  Care Plan : PHARMD MEDICATION MANAGEMENT  Updates made by Lavera Guise, RPH since 07/21/2021 12:00 AM     Problem: DISEASE PROGRESSION PREVENTION      Long-Range Goal: T2DM, HLD, HTN PHARMD GOAL   Recent Progress: Not on track  Priority: High  Note:   Current Barriers:  Unable to independently afford treatment regimen Unable to achieve control of T2DM  Suboptimal therapeutic regimen for T2DM  Pharmacist Clinical Goal(s):  patient will verbalize ability to afford treatment regimen achieve control of T2DM, HLD as evidenced by GOAL A1C, IMPROVED QUALITY OF LIFE, LDL<70 maintain control of T2DM, HLD as evidenced by GOAL A1C, IMPROVED QUALITY OF LIFE, LDL<70  adhere to plan to optimize therapeutic regimen for T2DM, HLD as evidenced by report of adherence to recommended medication management changes through collaboration with PharmD and provider.   Interventions: 1:1 collaboration with Loman Brooklyn, FNP regarding development and update of comprehensive plan of care as evidenced by provider attestation and co-signature Inter-disciplinary care team collaboration (see longitudinal plan of care) Comprehensive medication review performed; medication list updated in electronic medical record  Diabetes: Goal on Track (progressing): YES. "Uncontrolled"- A1C 7.8-->7.0%,  A1C practically at goal for patient, however still having HYPERglycemia and HYPOglycemia Patient reports blood sugar is running higher post-rotator cuff surgery Increased pain & diet not as well controlled since she is unable to prepare meals at this time She will work to increase meal time  insulin with heavier carb meals to cover the highs Current treatment: Tresiba 25 units AT BEDTIME, Humulin 70/30 mix pen (10-30 units with each meal), Onglyza 93m, Farxiga 146m metformin; Humulin 70/30 mix pen is familiar to her since her husband is also on--we have tried x2 to transition her over to basal/bolus, but this has been unsuccessful.  Patient resorts back to previous regimen if she begins to run high (concern for hypoglycemia/overlap with mix+basal+other meds) Noted intolerance to Trulicity (may consider Rybelsus, low dose ozempic, mounjaro to decrease insulin requirements) Denies personal and family history of Medullary thyroid cancer (MTC) Continue Tresiba 25 units  Continue mix insulin 70/30 to 10-30 units --patient doing 2-3 times daily (COUNSELED PATIENT TO BACK DOWN on MEAL TIME INSULIN WHEN EATING LESS--THIS IS USUALLY AT LUNCH TIME (only do 10units); DO NOT WAIT MORE THE 4 HOURS TO EAT DINNER; USE LIBRE TO GUIDE YOUR TREATMENT AND FOOD DECISIONS) Would like to have patient on rapid acting insulin only (not mix)--this has not worked out [employer] hypo events-->patient denies hypoglycemia in the past 2 weeks; counseled on administration of insulin and eating 3 steady meals daily (she was waiting to late to eat dinner and going low) Working to get li[employer]GM 2 (cost is $35/month) Continue ongylza (free via az&me patient assistance program) Continue farxiga (free via az&me patient assistance program) Continue metformin Current glucose readings: fasting glucose: <200, post prandial glucose: 200s Reports hypoglycemia has improved/no events x6 weeks The patient is asked to  make an attempt to improve diet and exercise patterns to aid in medical management of this problem. Discussed meal planning options and Plate method for healthy eating Avoid sugary drinks and desserts Incorporate balanced protein, non starchy veggies, 1 serving of carbohydrate with each meal Increase water  intake Increase physical activity as able Current exercise: n/a-->encouraged Recommended heart healthy diet, healthy plate method handout given; work on transition DM regimen Assessed patient finances. Patient approved for Az[employer]me patient assistance for Farxiga and Onglyza (sub for januvia-easier to get via patient assistance)--patient approved until 12/31/21, Humalog mix insulin 70/30--free via [employer] patient assistance  Hyperlipidemia:  Goal on Track (progressing): YES. Controlled-LDL 78-->67 current treatment: rosuvastatin 72m daily; now at goal;some myopathy with higher dose statin Medications previously tried: n/a  Current dietary patterns: heart healthy diet, healthy plate method handout given Counseled on diet, may increase statin if not at goal with A1c control and lifestyle changes Lipid Panel     Component Value Date/Time   CHOL 146 05/10/2021 0857   TRIG 122 05/10/2021 0857   HDL 57 05/10/2021 0857   CHOLHDL 2.6 05/10/2021 0857   CHOLHDL 3.8 05/02/2017 1617   LDLCALC 67 05/10/2021 0857   LDLCALC 111 (H) 05/02/2017 1617   LABVLDL 22 05/10/2021 0857   Hypertension: Patient at goal <130/80 BP home report-ranges from 110s-130s/65-80 Continue current regimen--on ACEi  Patient Goals/Self-Care Activities patient will:  - take medications as prescribed as evidenced by patient report and record review check glucose DAILY OR IF SYMPTOMATIC, document, and provide at future appointments collaborate with provider on medication access solutions target a minimum of 150 minutes of moderate intensity exercise weekly engage in dietary modifications by FOLLOWING HEALTHY PLATE METHOD/HEART HEALTHY DIET      Plan: Next PCP appointment scheduled for:  08/11/21   JRegina Eck PharmD, BCPS Clinical Pharmacist, [place] Phone [phone]

## 2021-07-21 NOTE — Progress Notes (Signed)
Orthopaedic Postop Note  Assessment: Masiah Lewing is a 66 y.o. female s/p right arthroscopic rotator cuff repair  DOS: 06/15/2021  Plan: Pain has improved Passive ROM has improved Ok to DC the sling Continue with PT Follow-up in 6 weeks Call with issues   Follow-up: Return in about 6 weeks (around 09/01/2021). XR at next visit: None  Subjective:  Chief Complaint  Patient presents with   Post-op Follow-up    Rotator cuff repair 06/15/21    History of Present Illness: Virna Livengood is a 66 y.o. female who presents following the above stated procedure.  She is approximately 5-6 weeks out from surgery.  The pain in her shoulder has improved.  She continues to have pain in the right hand.  Recently, she started to have similar pain in her left hand.  She was taking gabapentin, but this has not changed her symptoms, she does not think.  She has been working with physical therapy.  No issues.  She has been focusing on passive range of motion.  The therapist has told her she is doing well.   Review of Systems: No fevers or chills No numbness or tingling No Chest Pain No shortness of breath   Objective: There were no vitals taken for this visit.  Physical Exam:  Right shoulder surgical incisions are healing well.  No surrounding erythema or drainage.  No swelling.  Passive forward flexion to 100 degrees.  Passive abduction at her side to 90 degrees without discomfort.  Fingers are warm well perfused.  Sensation is intact throughout the right hand.  No swelling of the right hand.  2+ radial pulse.  IMAGING: I personally ordered and reviewed the following images:  No new imaging obtained today.   Oliver Barre, MD 07/21/2021 9:23 AM

## 2021-07-21 NOTE — Patient Instructions (Addendum)
Visit Information  Following are the goals we discussed today:  Current Barriers:  Unable to independently afford treatment regimen Unable to achieve control of T2DM  Suboptimal therapeutic regimen for T2DM  Pharmacist Clinical Goal(s):  patient will verbalize ability to afford treatment regimen achieve control of T2DM, HLD as evidenced by GOAL A1C, IMPROVED QUALITY OF LIFE, LDL<70 maintain control of T2DM, HLD as evidenced by GOAL A1C, IMPROVED QUALITY OF LIFE, LDL<70  adhere to plan to optimize therapeutic regimen for T2DM, HLD as evidenced by report of adherence to recommended medication management changes through collaboration with PharmD and provider.   Interventions: 1:1 collaboration with Gwenlyn Fudge, FNP regarding development and update of comprehensive plan of care as evidenced by provider attestation and co-signature Inter-disciplinary care team collaboration (see longitudinal plan of care) Comprehensive medication review performed; medication list updated in electronic medical record  Diabetes: Goal on Track (progressing): YES. "Uncontrolled"- A1C 7.8-->7.0%,  A1C practically at goal for patient, however still having HYPERglycemia and HYPOglycemia Patient reports blood sugar is running higher post-rotator cuff surgery Increased pain & diet not as well controlled since she is unable to prepare meals at this time She will work to increase meal time insulin with heavier carb meals to cover the highs Current treatment: Tresiba 25 units AT BEDTIME, Humulin 70/30 mix pen (10-30 units with each meal), Onglyza 5mg , Farxiga 10mg , metformin; Humulin 70/30 mix pen is familiar to her since her husband is also on--we have tried x2 to transition her over to basal/bolus, but this has been unsuccessful.  Patient resorts back to previous regimen if she begins to run high (concern for hypoglycemia/overlap with mix+basal+other meds) Noted intolerance to Trulicity (may consider Rybelsus, low  dose ozempic, mounjaro to decrease insulin requirements) Denies personal and family history of Medullary thyroid cancer (MTC) Continue Tresiba 25 units  Continue mix insulin 70/30 to 10-30 units --patient doing 2-3 times daily (COUNSELED PATIENT TO BACK DOWN on MEAL TIME INSULIN WHEN EATING LESS--THIS IS USUALLY AT LUNCH TIME (only do 10units); DO NOT WAIT MORE THE 4 HOURS TO EAT DINNER; USE LIBRE TO GUIDE YOUR TREATMENT AND FOOD DECISIONS) Would like to have patient on rapid acting insulin only (not mix)--this has not worked out 02-19-1971 about hypo events-->patient denies hypoglycemia in the past 2 weeks; counseled on administration of insulin and eating 3 steady meals daily (she was waiting to late to eat dinner and going low) Working to get 02-19-1971 CGM 2 (cost is $35/month) Continue ongylza (free via [employer] patient assistance program) Continue farxiga (free via [employer] patient assistance program) Continue metformin Current glucose readings: fasting glucose: <200, post prandial glucose: 200s Reports hypoglycemia has improved/no events x6 weeks The patient is asked to make an attempt to improve diet and exercise patterns to aid in medical management of this problem. Discussed meal planning options and Plate method for healthy eating Avoid sugary drinks and desserts Incorporate balanced protein, non starchy veggies, 1 serving of carbohydrate with each meal Increase water intake Increase physical activity as able Current exercise: n/a-->encouraged Recommended heart healthy diet, healthy plate method handout given; work on transition DM regimen Assessed patient finances. Patient approved for [employer] patient assistance for Farxiga and Onglyza (sub for januvia-easier to get via patient assistance)--patient approved until 12/31/21, Humalog mix insulin 70/30--free via [employer] patient assistance  Hyperlipidemia:  Goal on Track (progressing): YES. Controlled-LDL 78-->67 current treatment:  rosuvastatin 10mg  daily; now at goal;some myopathy with higher dose statin Medications previously tried: n/a  Current dietary patterns:  heart healthy diet, healthy plate method handout given Counseled on diet, may increase statin if not at goal with A1c control and lifestyle changes Lipid Panel     Component Value Date/Time   CHOL 146 05/10/2021 0857   TRIG 122 05/10/2021 0857   HDL 57 05/10/2021 0857   CHOLHDL 2.6 05/10/2021 0857   CHOLHDL 3.8 05/02/2017 1617   LDLCALC 67 05/10/2021 0857   LDLCALC 111 (H) 05/02/2017 1617   LABVLDL 22 05/10/2021 0857    Hypertension: Patient at goal <130/80 BP home report-ranges from 110s-130s/65-80 Continue current regimen--on ACEi  Patient Goals/Self-Care Activities patient will:  - take medications as prescribed as evidenced by patient report and record review check glucose DAILY OR IF SYMPTOMATIC, document, and provide at future appointments collaborate with provider on medication access solutions target a minimum of 150 minutes of moderate intensity exercise weekly engage in dietary modifications by FOLLOWING HEALTHY PLATE METHOD/HEART HEALTHY DIET   Plan: Telephone follow up appointment with care management team member scheduled for:  1 month  Signature Kieth Brightly, PharmD, BCPS Clinical Pharmacist, [facility]  II Phone [phone]   Please call the care guide team at [phone] if you need to cancel or reschedule your appointment.   The patient verbalized understanding of instructions, educational materials, and care plan provided today and DECLINED offer to receive copy of patient instructions, educational materials, and care plan.

## 2021-07-27 ENCOUNTER — Telehealth: Payer: Self-pay | Admitting: Family Medicine

## 2021-07-27 NOTE — Telephone Encounter (Signed)
Pt called requesting to speak with Sara Stuart. Its about her insulin. Says she still has some but is almost out.

## 2021-07-27 NOTE — Telephone Encounter (Signed)
Wife states the question was not about her. It was about her husband. Encounter will be closed.

## 2021-07-31 DIAGNOSIS — E782 Mixed hyperlipidemia: Secondary | ICD-10-CM | POA: Diagnosis not present

## 2021-07-31 DIAGNOSIS — E1165 Type 2 diabetes mellitus with hyperglycemia: Secondary | ICD-10-CM | POA: Diagnosis not present

## 2021-07-31 DIAGNOSIS — Z794 Long term (current) use of insulin: Secondary | ICD-10-CM

## 2021-08-04 ENCOUNTER — Other Ambulatory Visit: Payer: Self-pay

## 2021-08-04 MED ORDER — NITROGLYCERIN 0.4 MG SL SUBL: SUBLINGUAL_TABLET | SUBLINGUAL | 3 refills | Status: AC

## 2021-08-04 NOTE — Telephone Encounter (Signed)
Refilled NTG to [facility]

## 2021-08-08 ENCOUNTER — Other Ambulatory Visit: Payer: Self-pay | Admitting: Family Medicine

## 2021-08-08 DIAGNOSIS — I10 Essential (primary) hypertension: Secondary | ICD-10-CM

## 2021-08-11 ENCOUNTER — Ambulatory Visit (INDEPENDENT_AMBULATORY_CARE_PROVIDER_SITE_OTHER): Payer: Medicare Other

## 2021-08-11 ENCOUNTER — Ambulatory Visit (INDEPENDENT_AMBULATORY_CARE_PROVIDER_SITE_OTHER): Payer: Medicare Other | Admitting: Family Medicine

## 2021-08-11 ENCOUNTER — Encounter: Payer: Self-pay | Admitting: Family Medicine

## 2021-08-11 ENCOUNTER — Ambulatory Visit: Payer: Medicare Other | Admitting: Pharmacist

## 2021-08-11 VITALS — BP 102/64 | HR 65 | Temp 97.4°F | Ht 61.0 in | Wt 197.8 lb

## 2021-08-11 DIAGNOSIS — G4700 Insomnia, unspecified: Secondary | ICD-10-CM | POA: Diagnosis not present

## 2021-08-11 DIAGNOSIS — E782 Mixed hyperlipidemia: Secondary | ICD-10-CM | POA: Diagnosis not present

## 2021-08-11 DIAGNOSIS — E1165 Type 2 diabetes mellitus with hyperglycemia: Secondary | ICD-10-CM | POA: Diagnosis not present

## 2021-08-11 DIAGNOSIS — I2510 Atherosclerotic heart disease of native coronary artery without angina pectoris: Secondary | ICD-10-CM | POA: Diagnosis not present

## 2021-08-11 DIAGNOSIS — K219 Gastro-esophageal reflux disease without esophagitis: Secondary | ICD-10-CM | POA: Diagnosis not present

## 2021-08-11 DIAGNOSIS — I10 Essential (primary) hypertension: Secondary | ICD-10-CM

## 2021-08-11 DIAGNOSIS — I700 Atherosclerosis of aorta: Secondary | ICD-10-CM | POA: Diagnosis not present

## 2021-08-11 DIAGNOSIS — Z780 Asymptomatic menopausal state: Secondary | ICD-10-CM

## 2021-08-11 DIAGNOSIS — Z794 Long term (current) use of insulin: Secondary | ICD-10-CM

## 2021-08-11 LAB — BAYER DCA HB A1C WAIVED: HB A1C (BAYER DCA - WAIVED): 8.2 % — ABNORMAL HIGH (ref 4.8–5.6)

## 2021-08-11 MED ORDER — DILTIAZEM HCL ER 120 MG PO CP24: 120.0000 mg | ORAL_CAPSULE | Freq: Every day | ORAL | 1 refills | Status: DC

## 2021-08-11 MED ORDER — SPIRONOLACTONE 25 MG PO TABS: ORAL_TABLET | ORAL | 1 refills | Status: DC

## 2021-08-11 MED ORDER — TRIAMTERENE-HCTZ 37.5-25 MG PO CAPS: 1.0000 | ORAL_CAPSULE | Freq: Every day | ORAL | 1 refills | Status: DC

## 2021-08-11 MED ORDER — METFORMIN HCL ER 500 MG PO TB24: 1000.0000 mg | ORAL_TABLET | Freq: Two times a day (BID) | ORAL | 1 refills | Status: DC

## 2021-08-11 NOTE — Progress Notes (Unsigned)
Assessment & Plan:  1. Uncontrolled type 2 diabetes mellitus with hyperglycemia, with long-term current use of insulin (HCC) Lab Results  Component Value Date   HGBA1C 8.2 (H) 08/11/2021   HGBA1C 7.0 (H) 05/10/2021   HGBA1C 7.6 (H) 02/09/2021    - Diabetes is not at goal of A1c < 7. - Medications: continue current medications, discussed importance of taking medications as prescribed. Discussed in detail timing of insulins and appropriate use. - Home glucose monitoring: continue wearing freestyle libre. - Patient is currently taking a statin. Patient is taking an ACE-inhibitor/ARB.  - Instruction/counseling given: reminded to get eye exam  Diabetes Health Maintenance Due  Topic Date Due   OPHTHALMOLOGY EXAM  06/23/2021   FOOT EXAM  11/01/2021   HEMOGLOBIN A1C  02/11/2022    Lab Results  Component Value Date   LABMICR <3.0 11/01/2020   LABMICR <3.0 09/09/2019   MICROALBUR 1.8 05/02/2017   - Lipid panel - CMP14+EGFR - CBC with Differential/Platelet - Bayer DCA Hb A1c Waived - metFORMIN (GLUCOPHAGE-XR) 500 MG 24 hr tablet; Take 2 tablets (1,000 mg total) by mouth 2 (two) times daily.  Dispense: 360 tablet; Refill: 1  2. Essential hypertension, benign Well controlled on current regimen.  - Lipid panel - CMP14+EGFR - CBC with Differential/Platelet - diltiazem (DILT-XR) 120 MG 24 hr capsule; Take 1 capsule (120 mg total) by mouth daily.  Dispense: 90 capsule; Refill: 1 - spironolactone (ALDACTONE) 25 MG tablet; Take 1/2 (one-half) tablet by mouth once daily  Dispense: 45 tablet; Refill: 1 - triamterene-hydrochlorothiazide (DYAZIDE) 37.5-25 MG capsule; Take 1 each (1 capsule total) by mouth daily.  Dispense: 90 capsule; Refill: 1  3. Mixed hyperlipidemia Well controlled on current regimen.  - Lipid panel - CMP14+EGFR  4. Aortic atherosclerosis (HCC) Continue current regimen. - Lipid panel  5. Coronary artery disease involving native coronary artery of native heart  without angina pectoris Continue current regimen. - Lipid panel  6. Gastroesophageal reflux disease without esophagitis Well controlled on current regimen.  - CMP14+EGFR  7. Morbid obesity ([place]) Encouraged healthy eating and exercise. - Lipid panel - CMP14+EGFR - CBC with Differential/Platelet  8. Insomnia, unspecified type Well controlled on current regimen.  - CMP14+EGFR  9. Postmenopausal estrogen deficiency - DG [facility] DEXA   Return in about 3 months (around 11/11/2021) for follow-up of chronic medication conditions.  Hendricks Limes, MSN, APRN, FNP-C [facility]  Subjective:    Patient ID: Sara Stuart, female    DOB: 20-Jul-1955, 66 y.o.   MRN: [id number]  Patient Care Team: Loman Brooklyn, FNP as PCP - General (Family Medicine) Lavera Guise, [facility] as Pharmacist (Family Medicine) Celestia Khat, [place] (Optometry)   Chief Complaint:  Chief Complaint  Patient presents with   Medical Management of Chronic Issues    HPI: Sara Stuart is a 66 y.o. female presenting on 08/11/2021 for Medical Management of Chronic Issues  Diabetes with hypertension and hyperlipidemia: Patient presents for follow up of diabetes. Current symptoms include: hyperglycemia. Known diabetic complications: cardiovascular disease. Medication compliance: somewhat - she is taking [place], Metformin, Onglyza, and Humulin as prescribed, but is only taking the full dose of Tresiba when her evening blood sugar is >250. She is only taking 10 units if blood sugar is <200 and 20 units if it is 200-250. Current diet: in general, an "unhealthy" diet. States she has been eating a lot of sandwiches since having shoulder surgery as she is unable to cook. Current exercise: walking. Home blood  sugar records:  using the freestyle libre - active 96%, only in target range 37% . Is she  on ACE inhibitor or angiotensin II receptor blocker? Yes (Lisinopril). Is she on a statin? Yes (Rosuvastatin).      Aortic Atherosclerosis/CAD: taking aspirin and rosuvastatin.   GERD: taking omeprazole daily and TUMS as needed.  Insomnia: sleeping well with Trazodone.  New complaints: None   Social history:  Relevant past medical, surgical, family and social history reviewed and updated as indicated. Interim medical history since our last visit reviewed.  Allergies and medications reviewed and updated.  DATA REVIEWED: CHART IN EPIC  ROS: Negative unless specifically indicated above in HPI.    Current Outpatient Medications:    aspirin EC 81 MG tablet, Take 1 tablet (81 mg total) by mouth daily., Disp: 30 tablet, Rfl:    baclofen (LIORESAL) 10 MG tablet, Take 1 tablet (10 mg total) by mouth 3 (three) times daily as needed., Disp: 90 each, Rfl: 2   Continuous Blood Gluc Receiver (FREESTYLE LIBRE 2 READER) DEVI, Use to test blood sugar continuously. DX: E11.65, Disp: 1 each, Rfl: 0   Continuous Blood Gluc Sensor (FREESTYLE LIBRE 2 SENSOR) MISC, Use to test blood sugar continuously.  Replace sensor every 14 days. DX: E11.65, Disp: 2 each, Rfl: 11   dapagliflozin propanediol (FARXIGA) 10 MG TABS tablet, Take 1 tablet (10 mg total) by mouth daily before breakfast., Disp: 90 tablet, Rfl: 4   diltiazem (DILT-XR) 120 MG 24 hr capsule, Take 1 capsule (120 mg total) by mouth daily., Disp: 90 capsule, Rfl: 1   fluticasone (FLONASE) 50 MCG/ACT nasal spray, Place 2 sprays into both nostrils daily as needed., Disp: , Rfl:    gabapentin (NEURONTIN) 100 MG capsule, Take 1 capsule (100 mg total) by mouth 3 (three) times daily., Disp: 90 capsule, Rfl: 0   glucose blood test strip, Test BS four times daily Dx E11.65, Disp: 400 each, Rfl: 3   ibuprofen (ADVIL) 800 MG tablet, TAKE 1 TABLET BY MOUTH EVERY 8 HOURS AS NEEDED FOR MODERATE PAIN, Disp: 90 tablet, Rfl: 0   insulin degludec (TRESIBA FLEXTOUCH) 100 UNIT/ML FlexTouch Pen, Inject 26 Units into the skin daily. (Patient taking differently: Inject 20-26  Units into the skin daily.), Disp: 15 mL, Rfl: 3   insulin isophane & regular human KwikPen (HUMULIN 70/30 KWIKPEN) (70-30) 100 UNIT/ML KwikPen, Inject 35 Units into the skin with breakfast, with lunch, and with evening meal. (Patient taking differently: Inject 20-35 Units into the skin with breakfast, with lunch, and with evening meal.), Disp: 30 mL, Rfl: 11   Insulin Pen Needle (B-D ULTRAFINE III SHORT PEN) 31G X 8 MM MISC, 1 each by Does not apply route as directed., Disp: 100 each, Rfl: 3   lisinopril (ZESTRIL) 2.5 MG tablet, Take 1 tablet (2.5 mg total) by mouth daily., Disp: 90 tablet, Rfl: 1   metFORMIN (GLUCOPHAGE-XR) 500 MG 24 hr tablet, Take 2 tablets (1,000 mg total) by mouth 2 (two) times daily., Disp: 360 tablet, Rfl: 1   metoprolol tartrate (LOPRESSOR) 25 MG tablet, Take 1 tablet by mouth once daily, Disp: 90 tablet, Rfl: 0   nitroGLYCERIN (NITROSTAT) 0.4 MG SL tablet, DISSOLVE ONE TABLET UNDER THE TONGUE EVERY 5 MINUTES AS NEEDED FOR CHEST PAIN.  DO NOT EXCEED A TOTAL OF 3 DOSES IN 15 MINUTES, Disp: 25 tablet, Rfl: 3   omeprazole (PRILOSEC) 40 MG capsule, Take 1 capsule by mouth once daily, Disp: 90 capsule, Rfl: 0   rosuvastatin (  CRESTOR) 10 MG tablet, Take 1 tablet by mouth once daily, Disp: 90 tablet, Rfl: 1   saxagliptin HCl (ONGLYZA) 5 MG TABS tablet, Take 1 tablet (5 mg total) by mouth daily., Disp: 90 tablet, Rfl: 4   spironolactone (ALDACTONE) 25 MG tablet, Take 1/2 (one-half) tablet by mouth once daily, Disp: 45 tablet, Rfl: 0   traZODone (DESYREL) 50 MG tablet, TAKE 1 TABLET BY MOUTH AT BEDTIME, Disp: 90 tablet, Rfl: 0   triamterene-hydrochlorothiazide (DYAZIDE) 37.5-25 MG capsule, Take 1 each (1 capsule total) by mouth daily., Disp: 90 capsule, Rfl: 1   Allergies  Allergen Reactions   Trulicity [Dulaglutide] Other (See Comments)    GI intolerance   Past Medical History:  Diagnosis Date   Arthritis    Chronic back pain    spondylolishthesis   DDD (degenerative disc  disease), lumbosacral    Diabetes mellitus without complication ([employer]) diagnosed in 02/2014   takes Metformin and Glipizide daily   GERD (gastroesophageal reflux disease)    takes Omeprazole daily   Gout    takes Colchicine daily as needed   Hypertension    takes Dyazide and Diltiazem daily   Nocturia    Polio    PONV (postoperative nausea and vomiting)    Wears glasses     Past Surgical History:  Procedure Laterality Date   BACK SURGERY  07/2012   bladder tacked      COLONOSCOPY     FOOT SURGERY     more than 5 surgeries on right foot related to polio   SHOULDER ARTHROSCOPY WITH ROTATOR CUFF REPAIR Right 06/15/2021   Procedure: SHOULDER ARTHROSCOPY WITH ROTATOR CUFF REPAIR;  Surgeon: Mordecai Rasmussen, MD;  Location: [facility];  Service: Orthopedics;  Laterality: Right;   surgery for polio     states x 5    TOTAL ABDOMINAL HYSTERECTOMY  2002   TUBAL PREGNANCY   TUBAL LIGATION      Social History   Socioeconomic History   Marital status: Married    Spouse name: Not on file   Number of children: Not on file   Years of education: Not on file   Highest education level: Not on file  Occupational History   Occupation: Retired  Tobacco Use   Smoking status: Never   Smokeless tobacco: Never  Vaping Use   Vaping Use: Never used  Substance and Sexual Activity   Alcohol use: No   Drug use: No   Sexual activity: Yes    Birth control/protection: Surgical  Other Topics Concern   Not on file  Social History Narrative   Not on file   Social Determinants of Health   Financial Resource Strain: Not on file  Food Insecurity: Not on file  Transportation Needs: Not on file  Physical Activity: Not on file  Stress: Not on file  Social Connections: Not on file  Intimate Partner Violence: Not on file        Objective:    BP 102/64   Pulse 65   Temp (!) 97.4 F (36.3 C) (Temporal)   Ht $R'5\' 1"'pk$  (1.549 m)   Wt 197 lb 12.8 oz (89.7 kg)   SpO2 95%   BMI 37.37 kg/m   Wt Readings  from Last 3 Encounters:  08/11/21 197 lb 12.8 oz (89.7 kg)  06/15/21 203 lb 0.7 oz (92.1 kg)  06/12/21 203 lb (92.1 kg)    Physical Exam Vitals reviewed.  Constitutional:      General: She is not in  acute distress.    Appearance: Normal appearance. She is morbidly obese. She is not ill-appearing, toxic-appearing or diaphoretic.  HENT:     Head: Normocephalic and atraumatic.  Eyes:     General: No scleral icterus.       Right eye: No discharge.        Left eye: No discharge.     Conjunctiva/sclera: Conjunctivae normal.  Cardiovascular:     Rate and Rhythm: Normal rate and regular rhythm.     Heart sounds: Normal heart sounds. No murmur heard.    No friction rub. No gallop.  Pulmonary:     Effort: Pulmonary effort is normal. No respiratory distress.     Breath sounds: Normal breath sounds. No stridor. No wheezing, rhonchi or rales.  Musculoskeletal:        General: Normal range of motion.     Cervical back: Normal range of motion.  Skin:    General: Skin is warm and dry.     Capillary Refill: Capillary refill takes less than 2 seconds.  Neurological:     General: No focal deficit present.     Mental Status: She is alert and oriented to person, place, and time. Mental status is at baseline.  Psychiatric:        Mood and Affect: Mood normal.        Behavior: Behavior normal.        Thought Content: Thought content normal.        Judgment: Judgment normal.     Lab Results  Component Value Date   TSH 1.41 05/02/2017   Lab Results  Component Value Date   WBC 7.8 05/10/2021   HGB 13.7 05/10/2021   HCT 41.2 05/10/2021   MCV 80 05/10/2021   PLT 286 05/10/2021   Lab Results  Component Value Date   NA 139 05/10/2021   K 3.9 05/10/2021   CO2 21 05/10/2021   GLUCOSE 189 (H) 05/10/2021   BUN 21 05/10/2021   CREATININE 0.94 05/10/2021   BILITOT 0.2 05/10/2021   ALKPHOS 79 05/10/2021   AST 19 05/10/2021   ALT 14 05/10/2021   PROT 6.9 05/10/2021   ALBUMIN 4.4  05/10/2021   CALCIUM 9.4 05/10/2021   ANIONGAP 9 08/24/2019   EGFR 67 05/10/2021   Lab Results  Component Value Date   CHOL 146 05/10/2021   Lab Results  Component Value Date   HDL 57 05/10/2021   Lab Results  Component Value Date   LDLCALC 67 05/10/2021   Lab Results  Component Value Date   TRIG 122 05/10/2021   Lab Results  Component Value Date   CHOLHDL 2.6 05/10/2021   Lab Results  Component Value Date   HGBA1C 7.0 (H) 05/10/2021

## 2021-08-11 NOTE — Patient Instructions (Signed)
Please schedule your eye exam.

## 2021-08-12 LAB — LIPID PANEL
Chol/HDL Ratio: 2.9 ratio (ref 0.0–4.4)
Cholesterol, Total: 160 mg/dL (ref 100–199)
HDL: 55 mg/dL (ref >39)
LDL Chol Calc (NIH): 81 mg/dL (ref 0–99)
Triglycerides: 141 mg/dL (ref 0–149)
VLDL Cholesterol Cal: 24 mg/dL (ref 5–40)

## 2021-08-12 LAB — CBC WITH DIFFERENTIAL/PLATELET
Basophils Absolute: 0.1 10*3/uL (ref 0.0–0.2)
Basos: 1 %
EOS (ABSOLUTE): 0.4 10*3/uL (ref 0.0–0.4)
Eos: 4 %
Hematocrit: 39.9 % (ref 34.0–46.6)
Hemoglobin: 13.1 g/dL (ref 11.1–15.9)
Immature Grans (Abs): 0 10*3/uL (ref 0.0–0.1)
Immature Granulocytes: 0 %
Lymphocytes Absolute: 2.8 10*3/uL (ref 0.7–3.1)
Lymphs: 24 %
MCH: 26.8 pg (ref 26.6–33.0)
MCHC: 32.8 g/dL (ref 31.5–35.7)
MCV: 82 fL (ref 79–97)
Monocytes Absolute: 0.7 10*3/uL (ref 0.1–0.9)
Monocytes: 6 %
Neutrophils Absolute: 7.7 10*3/uL — ABNORMAL HIGH (ref 1.4–7.0)
Neutrophils: 65 %
Platelets: 311 10*3/uL (ref 150–450)
RBC: 4.88 x10E6/uL (ref 3.77–5.28)
RDW: 14.1 % (ref 11.7–15.4)
WBC: 11.8 10*3/uL — ABNORMAL HIGH (ref 3.4–10.8)

## 2021-08-12 LAB — CMP14+EGFR
ALT: 13 IU/L (ref 0–32)
AST: 15 IU/L (ref 0–40)
Albumin/Globulin Ratio: 1.7 (ref 1.2–2.2)
Albumin: 4.4 g/dL (ref 3.9–4.9)
Alkaline Phosphatase: 85 IU/L (ref 44–121)
BUN/Creatinine Ratio: 24 (ref 12–28)
BUN: 22 mg/dL (ref 8–27)
Bilirubin Total: 0.2 mg/dL (ref 0.0–1.2)
CO2: 22 mmol/L (ref 20–29)
Calcium: 9.7 mg/dL (ref 8.7–10.3)
Chloride: 101 mmol/L (ref 96–106)
Creatinine, Ser: 0.92 mg/dL (ref 0.57–1.00)
Globulin, Total: 2.6 g/dL (ref 1.5–4.5)
Glucose: 114 mg/dL — ABNORMAL HIGH (ref 70–99)
Potassium: 4 mmol/L (ref 3.5–5.2)
Sodium: 142 mmol/L (ref 134–144)
Total Protein: 7 g/dL (ref 6.0–8.5)
eGFR: 69 mL/min/{1.73_m2} (ref >59)

## 2021-08-16 NOTE — Progress Notes (Signed)
  Care Management   Follow Up Note   08/11/2021 Name: Sara Stuart MRN: [id number] DOB: [birth year]   Referred by: Gwenlyn Fudge, FNP Reason for referral : Chronic Care Management and Diabetes  Patient is scheduled to see PCP today.  We will reschedule patient.  A1c is increasing.    Kieth Brightly, PharmD, BCPS Clinical Pharmacist, [facility]  II Phone [phone]

## 2021-08-25 ENCOUNTER — Ambulatory Visit (INDEPENDENT_AMBULATORY_CARE_PROVIDER_SITE_OTHER): Payer: Medicare Other | Admitting: Pharmacist

## 2021-08-25 DIAGNOSIS — E782 Mixed hyperlipidemia: Secondary | ICD-10-CM

## 2021-08-25 DIAGNOSIS — E1165 Type 2 diabetes mellitus with hyperglycemia: Secondary | ICD-10-CM

## 2021-08-25 NOTE — Progress Notes (Signed)
Chronic Care Management Pharmacy Note  08/25/2021 Name:  Sara Stuart MRN:  [id number] DOB:  08/19/1955  Summary:  Diabetes: Goal on Track (progressing): NO "Uncontrolled"- A1C 7.0-->8.2%,  A1C increased, patient s/p ortho surgery  practically at goal for patient, however still having HYPERglycemia and HYPOglycemia Patient reports blood sugar is running higher post-rotator cuff surgery Increased pain & diet not as well controlled since she is unable to prepare meals at this time She will work to increase meal time insulin with heavier carb meals to cover the highs Current treatment: Tresiba 25 units AT BEDTIME, Humulin 70/30 mix pen (10-30 units with each meal), Onglyza 84m, Farxiga 145m metformin; Humulin 70/30 mix pen is familiar to her since her husband is also on--we have tried x2 to transition her over to basal/bolus, but this has been unsuccessful.  Patient resorts back to previous regimen if she begins to run high (concern for hypoglycemia/overlap with mix+basal+other meds) Noted intolerance to Trulicity (may consider Rybelsus, low dose ozempic, mounjaro to decrease insulin requirements) Denies personal and family history of Medullary thyroid cancer (MTC) Increase Tresiba to 26 units nightly Continue mix insulin 70/30 to 10-30 units  (increased to 20-28 per meal due to higher Bgs)--patient doing 2-3 times daily ; DO NOT WAIT MORE THE 4 HOURS TO EAT DINNER; USE LIBRE TO GUIDE YOUR TREATMENT AND FOOD DECISIONS) Would like to have patient on rapid acting insulin only (not mix)--this has not worked out x[employer] hypo events-->patient denies hypoglycemia in the past 2 weeks; counseled on administration of insulin and eating 3 steady meals daily (she was waiting to late to eat dinner and going low) Working to get liEnergy [employer]GM 2 (cost is $35/month) Continue ongylza (free via az&me patient assistance program) Continue farxiga (free via az&me patient assistance program) Continue  metformin Current glucose readings: fasting glucose: <200, post prandial glucose: 200s Reports hypoglycemia has improved/no events x6 weeks The patient is asked to make an attempt to improve diet and exercise patterns to aid in medical management of this problem. Discussed meal planning options and Plate method for healthy eating Avoid sugary drinks and desserts Incorporate balanced protein, non starchy veggies, 1 serving of carbohydrate with each meal Increase water intake Increase physical activity as able Current exercise: n/a-->encouraged Recommended heart healthy diet, healthy plate method handout given; work on transition DM regimen Assessed patient finances. Patient approved for Az[employer] patient assistance for Farxiga and Onglyza (sub for januvia-easier to get via patient assistance)--patient approved until 12/31/21, Humalog mix insulin 70/30--free via [employer] patient assistance  Hyperlipidemia:  Goal on Track (progressing): YES. Controlled-LDL 78-->67 current treatment: rosuvastatin 1059maily; now at goal;some myopathy with higher dose statin Medications previously tried: n/a  Current dietary patterns: heart healthy diet, healthy plate method handout given Counseled on diet, may increase statin if not at goal with A1c control and lifestyle changes Lipid Panel     Component Value Date/Time   CHOL 146 05/10/2021 0857   TRIG 122 05/10/2021 0857   HDL 57 05/10/2021 0857   CHOLHDL 2.6 05/10/2021 0857   CHOLHDL 3.8 05/02/2017 1617   LDLCALC 67 05/10/2021 0857   LDLCALC 111 (H) 05/02/2017 1617   LABVLDL 22 05/10/2021 0857    Hypertension: Patient at goal <130/80 BP home report-ranges from 110s-130s/65-80 Continue current regimen--on ACEi  Patient Goals/Self-Care Activities patient will:  - take medications as prescribed as evidenced by patient report and record review check glucose DAILY OR IF SYMPTOMATIC, document, and provide at future appointments  collaborate with  provider on medication access solutions target a minimum of 150 minutes of moderate intensity exercise weekly engage in dietary modifications by FOLLOWING HEALTHY PLATE METHOD/HEART HEALTHY DIET   Subjective: Sara Stuart is an 66 y.o. year old female who is a primary patient of Loman Brooklyn, FNP.  The CCM team was consulted for assistance with disease management and care coordination needs.    Engaged with patient by telephone for follow up visit in response to provider referral for pharmacy case management and/or care coordination services.   Consent to Services:  The patient was given information about Chronic Care Management services, agreed to services, and gave verbal consent prior to initiation of services.  Please see initial visit note for detailed documentation.   Patient Care Team: Loman Brooklyn, FNP as PCP - General (Family Medicine) Lavera Guise, [facility] as Pharmacist (Family Medicine) Celestia Khat, OD (Optometry)  Objective:  Lab Results  Component Value Date   CREATININE 0.92 08/11/2021   CREATININE 0.94 05/10/2021   CREATININE 0.87 02/09/2021    Lab Results  Component Value Date   HGBA1C 8.2 (H) 08/11/2021   Last diabetic Eye exam:  Lab Results  Component Value Date/Time   HMDIABEYEEXA No Retinopathy 06/23/2020 12:00 AM    Last diabetic Foot exam: No results found for: "HMDIABFOOTEX"      Component Value Date/Time   CHOL 160 08/11/2021 1334   TRIG 141 08/11/2021 1334   HDL 55 08/11/2021 1334   CHOLHDL 2.9 08/11/2021 1334   CHOLHDL 3.8 05/02/2017 1617   LDLCALC 81 08/11/2021 1334   LDLCALC 111 (H) 05/02/2017 1617       Latest Ref Rng & Units 08/11/2021    1:34 PM 05/10/2021    8:57 AM 02/09/2021    2:36 PM  Hepatic Function  Total Protein 6.0 - 8.5 g/dL 7.0  6.9  7.3   Albumin 3.9 - 4.9 g/dL 4.4  4.4  4.5   AST 0 - 40 IU/L '15  19  20   ' ALT 0 - 32 IU/L '13  14  17   ' Alk Phosphatase 44 - 121 IU/L 85  79  82   Total Bilirubin 0.0 - 1.2 mg/dL  0.2  0.2  0.2     Lab Results  Component Value Date/Time   TSH 1.41 05/02/2017 04:17 PM   FREET4 1.1 05/02/2017 04:17 PM       Latest Ref Rng & Units 08/11/2021    1:34 PM 05/10/2021    8:57 AM 02/09/2021    2:36 PM  CBC  WBC 3.4 - 10.8 x10E3/uL 11.8  7.8  11.7   Hemoglobin 11.1 - 15.9 g/dL 13.1  13.7  14.1   Hematocrit 34.0 - 46.6 % 39.9  41.2  43.2   Platelets 150 - 450 x10E3/uL 311  286  314     Lab Results  Component Value Date/Time   VD25OH 22 (L) 05/02/2017 04:17 PM    Clinical ASCVD: No  The 10-year ASCVD risk score (Arnett DK, et al., 2019) is: 8.1%   Values used to calculate the score:     Age: 28 years     Sex: Female     Is Non-Hispanic African American: No     Diabetic: Yes     Tobacco smoker: No     Systolic Blood Pressure: 235 mmHg     Is BP treated: Yes     HDL Cholesterol: 55 mg/dL     Total Cholesterol: 160  mg/dL    Other: (CHADS2VASc if Afib, PHQ9 if depression, MMRC or CAT for COPD, ACT, DEXA)  Social History   Tobacco Use  Smoking Status Never  Smokeless Tobacco Never   BP Readings from Last 3 Encounters:  08/11/21 102/64  06/15/21 131/69  06/12/21 117/74   Pulse Readings from Last 3 Encounters:  08/11/21 65  06/15/21 67  06/12/21 80   Wt Readings from Last 3 Encounters:  08/11/21 197 lb 12.8 oz (89.7 kg)  06/15/21 203 lb 0.7 oz (92.1 kg)  06/12/21 203 lb (92.1 kg)    Assessment: Review of patient past medical history, allergies, medications, health status, including review of consultants reports, laboratory and other test data, was performed as part of comprehensive evaluation and provision of chronic care management services.   SDOH:  (Social Determinants of Health) assessments and interventions performed:    CCM Care Plan  Allergies  Allergen Reactions   Trulicity [Dulaglutide] Other (See Comments)    GI intolerance    Medications Reviewed Today     Reviewed by Lavera Guise, [facility] (Pharmacist) on 08/25/21 at [place]  List Status: <None>   Medication Order Taking? Sig Documenting Provider Last Dose Status Informant  aspirin EC 81 MG tablet [id number] No Take 1 tablet (81 mg total) by mouth daily. Orson Eva, MD Taking Active Self           Med Note [facility] A   Thu Mar 14, 2017  2:26 PM)    baclofen (LIORESAL) 10 MG tablet [id number] No Take 1 tablet (10 mg total) by mouth 3 (three) times daily as needed. Loman Brooklyn, FNP Taking Active   Continuous Blood Gluc Receiver (FREESTYLE LIBRE 2 READER) DEVI [id number] No Use to test blood sugar continuously. DX: E11.65 Loman Brooklyn, FNP Taking Active            Med Note Blanca Friend, Royce Macadamia   Thu Jun 01, 2021 11:06 AM) SENT TO ADV DIABETES SUPPLY VIA [place] PORTAL  Continuous Blood Gluc Sensor (FREESTYLE LIBRE 2 SENSOR) MISC [id number] No Use to test blood sugar continuously.  Replace sensor every 14 days. DX: E11.65 Loman Brooklyn, FNP Taking Active            Med Note Blanca Friend, Royce Macadamia   Thu Jun 01, 2021 11:07 AM) SENT TO ADV DIABETES SUPPLY VIA [place] PORTAL   dapagliflozin propanediol (FARXIGA) 10 MG TABS tablet [id number] No Take 1 tablet (10 mg total) by mouth daily before breakfast. Loman Brooklyn, FNP Taking Active   diltiazem (DILT-XR) 120 MG 24 hr capsule [id number]  Take 1 capsule (120 mg total) by mouth daily. Loman Brooklyn, FNP  Active   fluticasone [facility]) 50 MCG/ACT nasal spray [id number] No Place 2 sprays into both nostrils daily as needed. [provider] Taking Active   gabapentin (NEURONTIN) 100 MG capsule [id number] No Take 1 capsule (100 mg total) by mouth 3 (three) times daily. Mordecai Rasmussen, MD Taking Active   glucose blood test strip [id number] No Test BS four times daily Dx E11.65 Ronnie Doss M, DO Taking Active   ibuprofen (ADVIL) 800 MG tablet [id number] No TAKE 1 TABLET BY MOUTH EVERY 8 HOURS AS NEEDED FOR MODERATE PAIN Loman Brooklyn, FNP Taking Active   insulin degludec (TRESIBA FLEXTOUCH) 100 UNIT/ML  FlexTouch Pen [id number] No Inject 26 Units into the skin daily.  Patient taking differently: Inject 20-26 Units into the skin daily.   Loman Brooklyn, FNP  Taking Active            Med Note Loman Brooklyn   Fri Aug 11, 2021  2:10 PM)    insulin isophane & regular human KwikPen (HUMULIN 70/30 KWIKPEN) (70-30) 100 UNIT/ML KwikPen [id number] No Inject 35 Units into the skin with breakfast, with lunch, and with evening meal.  Patient taking differently: Inject 20-35 Units into the skin with breakfast, with lunch, and with evening meal.   Loman Brooklyn, FNP Taking Active   Insulin Pen Needle (B-D ULTRAFINE III SHORT PEN) 31G X 8 MM MISC [id number] No 1 each by Does not apply route as directed. Cassandria Anger, MD Taking Active Self  lisinopril (ZESTRIL) 2.5 MG tablet [id number] No Take 1 tablet (2.5 mg total) by mouth daily. Loman Brooklyn, FNP Taking Active   metFORMIN (GLUCOPHAGE-XR) 500 MG 24 hr tablet [id number]  Take 2 tablets (1,000 mg total) by mouth 2 (two) times daily. Loman Brooklyn, FNP  Active   metoprolol tartrate (LOPRESSOR) 25 MG tablet [id number] No Take 1 tablet by mouth once daily Hendricks Limes F, FNP Taking Active   nitroGLYCERIN (NITROSTAT) 0.4 MG SL tablet [id number] No DISSOLVE ONE TABLET UNDER THE TONGUE EVERY 5 MINUTES AS NEEDED FOR CHEST PAIN.  DO NOT EXCEED A TOTAL OF 3 DOSES IN 15 MINUTES Branch, Alphonse Guild, MD Taking Active   omeprazole (PRILOSEC) 40 MG capsule [id number] No Take 1 capsule by mouth once daily Loman Brooklyn, FNP Taking Active   rosuvastatin (CRESTOR) 10 MG tablet [id number] No Take 1 tablet by mouth once daily Evelina Dun A, FNP Taking Active   saxagliptin HCl (ONGLYZA) 5 MG TABS tablet [id number] No Take 1 tablet (5 mg total) by mouth daily. Loman Brooklyn, FNP Taking Active   spironolactone (ALDACTONE) 25 MG tablet [id number]  Take 1/2 (one-half) tablet by mouth once daily Loman Brooklyn, FNP  Active   traZODone (DESYREL) 50 MG tablet  [id number] No TAKE 1 TABLET BY MOUTH AT BEDTIME Hendricks Limes F, FNP Taking Active   triamterene-hydrochlorothiazide (DYAZIDE) 37.5-25 MG capsule [id number]  Take 1 each (1 capsule total) by mouth daily. Loman Brooklyn, FNP  Active   Med List Note Lavera Guise [facility] 06/01/21 1127): Prescription assistance for Onglyza and Farxiga--ESCRIBE TO [place] ([employer] PATIENT ASSISTANCE) Prescription assistance for HUMALOG KWIKPEN 70/30--ESCRIBE TO [facility] [place] ([employer] patient assistance)             Patient Active Problem List   Diagnosis Date Noted   Aortic atherosclerosis ([place]) 11/01/2020   Insomnia 12/18/2019   Coronary artery disease involving native coronary artery of native heart without angina pectoris 02/03/2019   Mixed hyperlipidemia 10/10/2017   Uncontrolled type 2 diabetes mellitus with hyperglycemia, with long-term current use of insulin ([place]) 03/14/2017   Essential hypertension, benign 03/14/2017   GERD (gastroesophageal reflux disease) 12/29/2012   History of gout 12/29/2012   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   Morbid obesity ([place]) 12/29/2012   Degenerative spondylolisthesis 07/08/2012   Spinal stenosis, lumbar region, with neurogenic claudication 07/08/2012    Immunization History  Administered Date(s) Administered   Influenza, High Dose Seasonal PF 10/22/2020   Influenza, Quadrivalent, Recombinant, Inj, Pf 09/18/2018   Influenza,inj,Quad PF,6+ Mos 09/21/2017   Influenza,inj,quad, With Preservative 01/05/2017, 09/21/2017   Influenza,trivalent, recombinat, inj, PF 10/02/2014   Influenza-Unspecified 09/18/2018, 10/02/2019, 10/27/2020   Moderna Sars-Covid-2 Vaccination 11/03/2019, 12/03/2019, 06/16/2020   PNEUMOCOCCAL CONJUGATE-20 11/01/2020   Pneumococcal Polysaccharide-23 09/10/2017  Tdap 01/07/2018   Zoster Recombinat (Shingrix) 02/09/2021    Conditions to be addressed/monitored: HTN, HLD, and DMII  Care Plan :  PHARMD MEDICATION MANAGEMENT  Updates made by Lavera Guise, RPH since 08/30/2021 12:00 AM     Problem: DISEASE PROGRESSION PREVENTION      Long-Range Goal: T2DM, HLD, HTN PHARMD GOAL   Recent Progress: Not on track  Priority: High  Note:   Current Barriers:  Unable to independently afford treatment regimen Unable to achieve control of T2DM  Suboptimal therapeutic regimen for T2DM  Pharmacist Clinical Goal(s):  patient will verbalize ability to afford treatment regimen achieve control of T2DM, HLD as evidenced by GOAL A1C, IMPROVED QUALITY OF LIFE, LDL<70 maintain control of T2DM, HLD as evidenced by GOAL A1C, IMPROVED QUALITY OF LIFE, LDL<70  adhere to plan to optimize therapeutic regimen for T2DM, HLD as evidenced by report of adherence to recommended medication management changes through collaboration with PharmD and provider.   Interventions: 1:1 collaboration with Loman Brooklyn, FNP regarding development and update of comprehensive plan of care as evidenced by provider attestation and co-signature Inter-disciplinary care team collaboration (see longitudinal plan of care) Comprehensive medication review performed; medication list updated in electronic medical record  Diabetes: Goal on Track (progressing): YES. "Uncontrolled"- A1C 7.0-->8.2%,  A1C increased, patient s/p ortho surgery  practically at goal for patient, however still having HYPERglycemia and HYPOglycemia Patient reports blood sugar is running higher post-rotator cuff surgery Increased pain & diet not as well controlled since she is unable to prepare meals at this time She will work to increase meal time insulin with heavier carb meals to cover the highs Current treatment: Tresiba 25 units AT BEDTIME, Humulin 70/30 mix pen (10-30 units with each meal), Onglyza 4m, Farxiga 117m metformin; Humulin 70/30 mix pen is familiar to her since her husband is also on--we have tried x2 to transition her over to basal/bolus,  but this has been unsuccessful.  Patient resorts back to previous regimen if she begins to run high (concern for hypoglycemia/overlap with mix+basal+other meds) Noted intolerance to Trulicity (may consider Rybelsus, low dose ozempic, mounjaro to decrease insulin requirements) Denies personal and family history of Medullary thyroid cancer (MTC) Increase Tresiba to 26 units nightly Continue mix insulin 70/30 to 10-30 units  (increased to 20-28 per meal due to higher Bgs)--patient doing 2-3 times daily ; DO NOT WAIT MORE THE 4 HOURS TO EAT DINNER; USE LIBRE TO GUIDE YOUR TREATMENT AND FOOD DECISIONS) Would like to have patient on rapid acting insulin only (not mix)--this has not worked out [employer] hypo events-->patient denies hypoglycemia in the past 2 weeks; counseled on administration of insulin and eating 3 steady meals daily (she was waiting to late to eat dinner and going low) Working to get li[employer]GM 2 (cost is $35/month) Continue ongylza (free via az&me patient assistance program) Continue farxiga (free via az&me patient assistance program) Continue metformin Current glucose readings: fasting glucose: <200, post prandial glucose: 200s Reports hypoglycemia has improved/no events x6 weeks The patient is asked to make an attempt to improve diet and exercise patterns to aid in medical management of this problem. Discussed meal planning options and Plate method for healthy eating Avoid sugary drinks and desserts Incorporate balanced protein, non starchy veggies, 1 serving of carbohydrate with each meal Increase water intake Increase physical activity as able Current exercise: n/a-->encouraged Recommended heart healthy diet, healthy plate method handout given; work on transition DM regimen Assessed patient finances. Patient approved for  Az[employer]me patient assistance for Farxiga and Onglyza (sub for januvia-easier to get via patient assistance)--patient approved until 12/31/21, Humalog mix  insulin 70/30--free via [employer] patient assistance  Hyperlipidemia:  Goal on Track (progressing): YES. Controlled-LDL 78-->67 current treatment: rosuvastatin 64m daily; now at goal;some myopathy with higher dose statin Medications previously tried: n/a  Current dietary patterns: heart healthy diet, healthy plate method handout given Counseled on diet, may increase statin if not at goal with A1c control and lifestyle changes Lipid Panel     Component Value Date/Time   CHOL 146 05/10/2021 0857   TRIG 122 05/10/2021 0857   HDL 57 05/10/2021 0857   CHOLHDL 2.6 05/10/2021 0857   CHOLHDL 3.8 05/02/2017 1617   LDLCALC 67 05/10/2021 0857   LDLCALC 111 (H) 05/02/2017 1617   LABVLDL 22 05/10/2021 0857   Hypertension: Patient at goal <130/80 BP home report-ranges from 110s-130s/65-80 Continue current regimen--on ACEi  Patient Goals/Self-Care Activities patient will:  - take medications as prescribed as evidenced by patient report and record review check glucose DAILY OR IF SYMPTOMATIC, document, and provide at future appointments collaborate with provider on medication access solutions target a minimum of 150 minutes of moderate intensity exercise weekly engage in dietary modifications by FOLLOWING HEALTHY PLATE METHOD/HEART HEALTHY DIET     Plan: Telephone follow up appointment with care management team member scheduled for:  3 months   JRegina Eck PharmD, BCPS Clinical Pharmacist, [place] Phone [phone]

## 2021-08-30 NOTE — Patient Instructions (Signed)
Visit Information  Following are the goals we discussed today:  Current Barriers:  Unable to independently afford treatment regimen Unable to achieve control of T2DM  Suboptimal therapeutic regimen for T2DM  Pharmacist Clinical Goal(s):  patient will verbalize ability to afford treatment regimen achieve control of T2DM, HLD as evidenced by GOAL A1C, IMPROVED QUALITY OF LIFE, LDL<70 maintain control of T2DM, HLD as evidenced by GOAL A1C, IMPROVED QUALITY OF LIFE, LDL<70  adhere to plan to optimize therapeutic regimen for T2DM, HLD as evidenced by report of adherence to recommended medication management changes through collaboration with PharmD and provider.   Interventions: 1:1 collaboration with Gwenlyn Fudge, FNP regarding development and update of comprehensive plan of care as evidenced by provider attestation and co-signature Inter-disciplinary care team collaboration (see longitudinal plan of care) Comprehensive medication review performed; medication list updated in electronic medical record  Diabetes: Goal on Track (progressing): YES. "Uncontrolled"- A1C 7.0-->8.2%,  A1C increased, patient s/p ortho surgery  practically at goal for patient, however still having HYPERglycemia and HYPOglycemia Patient reports blood sugar is running higher post-rotator cuff surgery Increased pain & diet not as well controlled since she is unable to prepare meals at this time She will work to increase meal time insulin with heavier carb meals to cover the highs Current treatment: Tresiba 25 units AT BEDTIME, Humulin 70/30 mix pen (10-30 units with each meal), Onglyza 5mg , Farxiga 10mg , metformin; Humulin 70/30 mix pen is familiar to her since her husband is also on--we have tried x2 to transition her over to basal/bolus, but this has been unsuccessful.  Patient resorts back to previous regimen if she begins to run high (concern for hypoglycemia/overlap with mix+basal+other meds) Noted intolerance to  Trulicity (may consider Rybelsus, low dose ozempic, mounjaro to decrease insulin requirements) Denies personal and family history of Medullary thyroid cancer (MTC) Increase Tresiba to 26 units nightly Continue mix insulin 70/30 to 10-30 units  (increased to 20-28 per meal due to higher Bgs)--patient doing 2-3 times daily ; DO NOT WAIT MORE THE 4 HOURS TO EAT DINNER; USE LIBRE TO GUIDE YOUR TREATMENT AND FOOD DECISIONS) Would like to have patient on rapid acting insulin only (not mix)--this has not worked out 02-19-1971 about hypo events-->patient denies hypoglycemia in the past 2 weeks; counseled on administration of insulin and eating 3 steady meals daily (she was waiting to late to eat dinner and going low) Working to get 02-19-1971 CGM 2 (cost is $35/month) Continue ongylza (free via [employer] patient assistance program) Continue farxiga (free via [employer] patient assistance program) Continue metformin Current glucose readings: fasting glucose: <200, post prandial glucose: 200s Reports hypoglycemia has improved/no events x6 weeks The patient is asked to make an attempt to improve diet and exercise patterns to aid in medical management of this problem. Discussed meal planning options and Plate method for healthy eating Avoid sugary drinks and desserts Incorporate balanced protein, non starchy veggies, 1 serving of carbohydrate with each meal Increase water intake Increase physical activity as able Current exercise: n/a-->encouraged Recommended heart healthy diet, healthy plate method handout given; work on transition DM regimen Assessed patient finances. Patient approved for [employer] patient assistance for Farxiga and Onglyza (sub for januvia-easier to get via patient assistance)--patient approved until 12/31/21, Humalog mix insulin 70/30--free via [employer] patient assistance  Hyperlipidemia:  Goal on Track (progressing): YES. Controlled-LDL 78-->67 current treatment: rosuvastatin 10mg  daily; now at  goal;some myopathy with higher dose statin Medications previously tried: n/a  Current dietary patterns: heart healthy diet,  healthy plate method handout given Counseled on diet, may increase statin if not at goal with A1c control and lifestyle changes Lipid Panel     Component Value Date/Time   CHOL 146 05/10/2021 0857   TRIG 122 05/10/2021 0857   HDL 57 05/10/2021 0857   CHOLHDL 2.6 05/10/2021 0857   CHOLHDL 3.8 05/02/2017 1617   LDLCALC 67 05/10/2021 0857   LDLCALC 111 (H) 05/02/2017 1617   LABVLDL 22 05/10/2021 0857    Hypertension: Patient at goal <130/80 BP home report-ranges from 110s-130s/65-80 Continue current regimen--on ACEi  Patient Goals/Self-Care Activities patient will:  - take medications as prescribed as evidenced by patient report and record review check glucose DAILY OR IF SYMPTOMATIC, document, and provide at future appointments collaborate with provider on medication access solutions target a minimum of 150 minutes of moderate intensity exercise weekly engage in dietary modifications by FOLLOWING HEALTHY PLATE METHOD/HEART HEALTHY DIET   Plan: Telephone follow up appointment with care management team member scheduled for:  3 months  Signature Kieth Brightly, PharmD, BCPS Clinical Pharmacist, [facility]  II Phone [phone]   Please call the care guide team at [phone] if you need to cancel or reschedule your appointment.   The patient verbalized understanding of instructions, educational materials, and care plan provided today and DECLINED offer to receive copy of patient instructions, educational materials, and care plan.

## 2021-08-31 DIAGNOSIS — E785 Hyperlipidemia, unspecified: Secondary | ICD-10-CM

## 2021-08-31 DIAGNOSIS — E1169 Type 2 diabetes mellitus with other specified complication: Secondary | ICD-10-CM | POA: Diagnosis not present

## 2021-08-31 DIAGNOSIS — I10 Essential (primary) hypertension: Secondary | ICD-10-CM

## 2021-08-31 DIAGNOSIS — Z794 Long term (current) use of insulin: Secondary | ICD-10-CM | POA: Diagnosis not present

## 2021-09-01 ENCOUNTER — Encounter: Payer: Medicare Other | Admitting: Orthopedic Surgery

## 2021-09-06 DIAGNOSIS — E1165 Type 2 diabetes mellitus with hyperglycemia: Secondary | ICD-10-CM | POA: Diagnosis not present

## 2021-09-18 ENCOUNTER — Ambulatory Visit (INDEPENDENT_AMBULATORY_CARE_PROVIDER_SITE_OTHER): Payer: Medicare Other | Admitting: Nurse Practitioner

## 2021-09-18 ENCOUNTER — Other Ambulatory Visit: Payer: Self-pay | Admitting: Family

## 2021-09-18 ENCOUNTER — Ambulatory Visit (INDEPENDENT_AMBULATORY_CARE_PROVIDER_SITE_OTHER): Payer: Medicare Other

## 2021-09-18 ENCOUNTER — Encounter: Payer: Self-pay | Admitting: Nurse Practitioner

## 2021-09-18 VITALS — BP 113/73 | HR 68 | Temp 98.6°F | Ht 61.0 in | Wt 201.0 lb

## 2021-09-18 DIAGNOSIS — R1032 Left lower quadrant pain: Secondary | ICD-10-CM | POA: Diagnosis not present

## 2021-09-18 DIAGNOSIS — E782 Mixed hyperlipidemia: Secondary | ICD-10-CM

## 2021-09-18 DIAGNOSIS — I2510 Atherosclerotic heart disease of native coronary artery without angina pectoris: Secondary | ICD-10-CM

## 2021-09-18 LAB — MICROSCOPIC EXAMINATION
Bacteria, UA: NONE SEEN
RBC, Urine: NONE SEEN /hpf (ref 0–2)
Renal Epithel, UA: NONE SEEN /hpf

## 2021-09-18 LAB — URINALYSIS, ROUTINE W REFLEX MICROSCOPIC
Bilirubin, UA: NEGATIVE
Ketones, UA: NEGATIVE
Nitrite, UA: NEGATIVE
Protein,UA: NEGATIVE
Specific Gravity, UA: 1.015 (ref 1.005–1.030)
Urobilinogen, Ur: 0.2 mg/dL (ref 0.2–1.0)
pH, UA: 5.5 (ref 5.0–7.5)

## 2021-09-18 MED ORDER — ACETAMINOPHEN 500 MG PO TABS: 500.0000 mg | ORAL_TABLET | Freq: Four times a day (QID) | ORAL | 0 refills | Status: AC | PRN

## 2021-09-18 NOTE — Patient Instructions (Signed)
Abdominal Pain, Adult Many things can cause belly (abdominal) pain. Most times, belly pain is not dangerous. Many cases of belly pain can be watched and treated at home. Sometimes, though, belly pain is serious. Your doctor will try to find the cause of your belly pain. Follow these instructions at home:  Medicines Take over-the-counter and prescription medicines only as told by your doctor. Do not take medicines that help you poop (laxatives) unless told by your doctor. General instructions Watch your belly pain for any changes. Drink enough fluid to keep your pee (urine) pale yellow. Keep all follow-up visits as told by your doctor. This is important. Contact a doctor if: Your belly pain changes or gets worse. You are not hungry, or you lose weight without trying. You are having trouble pooping (constipated) or have watery poop (diarrhea) for more than 2-3 days. You have pain when you pee or poop. Your belly pain wakes you up at night. Your pain gets worse with meals, after eating, or with certain foods. You are vomiting and cannot keep anything down. You have a fever. You have blood in your pee. Get help right away if: Your pain does not go away as soon as your doctor says it should. You cannot stop vomiting. Your pain is only in areas of your belly, such as the right side or the left lower part of the belly. You have bloody or black poop, or poop that looks like tar. You have very bad pain, cramping, or bloating in your belly. You have signs of not having enough fluid or water in your body (dehydration), such as: Dark pee, very little pee, or no pee. Cracked lips. Dry mouth. Sunken eyes. Sleepiness. Weakness. You have trouble breathing or chest pain. Summary Many cases of belly pain can be watched and treated at home. Watch your belly pain for any changes. Take over-the-counter and prescription medicines only as told by your doctor. Contact a doctor if your belly pain  changes or gets worse. Get help right away if you have very bad pain, cramping, or bloating in your belly. This information is not intended to replace advice given to you by your health care provider. Make sure you discuss any questions you have with your health care provider. Document Revised: 04/28/2018 Document Reviewed: 04/28/2018 Elsevier Patient Education  2023 [employer] Inc.  

## 2021-09-18 NOTE — Progress Notes (Signed)
Acute Office Visit  Subjective:     Patient ID: Sara Stuart, female    DOB: 1955/08/19, 66 y.o.   MRN: [id number]  Chief Complaint  Patient presents with   Abdominal Pain    4-5 days    Abdominal Pain This is a new problem. Episode onset: in the past 4 days. The onset quality is gradual. The problem occurs constantly. The problem has been unchanged. The pain is located in the LLQ. The pain is at a severity of 6/10. The pain is moderate. The quality of the pain is aching. The abdominal pain radiates to the LLQ. Pertinent negatives include no constipation, diarrhea, fever or frequency. Nothing aggravates the pain. The pain is relieved by Nothing. She has tried nothing for the symptoms.    Review of Systems  Constitutional: Negative.  Negative for chills and fever.  HENT: Negative.    Eyes: Negative.   Respiratory: Negative.    Cardiovascular: Negative.   Gastrointestinal:  Positive for abdominal pain. Negative for constipation and diarrhea.  Genitourinary:  Negative for frequency.  Musculoskeletal: Negative.   Skin: Negative.  Negative for itching and rash.  All other systems reviewed and are negative.       Objective:    BP 113/73   Pulse 68   Temp 98.6 F (37 C)   Ht 5\' 1"  (1.549 m)   Wt 201 lb (91.2 kg)   SpO2 99%   BMI 37.98 kg/m  BP Readings from Last 3 Encounters:  09/18/21 113/73  08/11/21 102/64  06/15/21 131/69      Physical Exam Vitals and nursing note reviewed.  Constitutional:      Appearance: Normal appearance. She is well-developed.  HENT:     Head: Normocephalic.     Right Ear: External ear normal.     Left Ear: External ear normal.     Nose: Nose normal.     Mouth/Throat:     Mouth: Mucous membranes are moist.  Cardiovascular:     Rate and Rhythm: Normal rate and regular rhythm.     Pulses: Normal pulses.     Heart sounds: Normal heart sounds.  Pulmonary:     Effort: Pulmonary effort is normal.     Breath sounds: Normal breath sounds.   Abdominal:     General: Bowel sounds are normal.     Palpations: Abdomen is soft.     Tenderness: There is abdominal tenderness in the left lower quadrant. There is no right CVA tenderness or left CVA tenderness.  Skin:    Findings: No erythema or rash.  Neurological:     Mental Status: She is alert and oriented to person, place, and time.  Psychiatric:        Behavior: Behavior normal.     No results found for any visits on 09/18/21.      Assessment & Plan:  Left lower quadrant abdominal pain symptoms present in the past 3 to 4 days.  Patient denies fever, nausea, diarrhea or constipation.  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  Completed abdominal x-ray, urinalysis completed results pending.  Tylenol/ibuprofen as needed for pain. Follow-up with unresolved symptoms. Problem List Items Addressed This Visit   None Visit Diagnoses     Left lower quadrant abdominal pain    -  Primary   Relevant Orders   DG Abd 2 Views   Urinalysis, Routine w reflex microscopic       Meds ordered this encounter  Medications   acetaminophen (  TYLENOL) 500 MG tablet    Sig: Take 1 tablet (500 mg total) by mouth every 6 (six) hours as needed for moderate pain.    Dispense:  30 tablet    Refill:  0    Order Specific Question:   Supervising Provider    Answer:   Claretta Fraise [phone]    Return if symptoms worsen or fail to improve.  Ivy Lynn, NP

## 2021-09-21 ENCOUNTER — Encounter: Payer: Self-pay | Admitting: Nurse Practitioner

## 2021-09-21 ENCOUNTER — Other Ambulatory Visit: Payer: Self-pay | Admitting: Nurse Practitioner

## 2021-09-21 DIAGNOSIS — R8290 Unspecified abnormal findings in urine: Secondary | ICD-10-CM

## 2021-09-21 MED ORDER — NITROFURANTOIN MONOHYD MACRO 100 MG PO CAPS: 100.0000 mg | ORAL_CAPSULE | Freq: Two times a day (BID) | ORAL | 0 refills | Status: DC

## 2021-09-22 ENCOUNTER — Other Ambulatory Visit: Payer: Self-pay | Admitting: Family Medicine

## 2021-09-22 ENCOUNTER — Other Ambulatory Visit: Payer: Medicare Other

## 2021-09-22 ENCOUNTER — Other Ambulatory Visit: Payer: Self-pay | Admitting: Nurse Practitioner

## 2021-09-22 DIAGNOSIS — R8290 Unspecified abnormal findings in urine: Secondary | ICD-10-CM

## 2021-09-22 NOTE — Telephone Encounter (Signed)
I sent in medication for possible UTI, that may be causing abdominal pain if you say you are having good bowel movement they will x-ray you had large amount of stool in the colon, sometimes people do pass liquid stool around hard stool.  If it is not resolved after completing antibiotics for UTI we may need to reassess and send you to GI.  Also added cultures to urinalysis

## 2021-09-25 ENCOUNTER — Other Ambulatory Visit: Payer: Self-pay

## 2021-09-25 ENCOUNTER — Encounter (HOSPITAL_COMMUNITY): Payer: Self-pay | Admitting: Emergency Medicine

## 2021-09-25 ENCOUNTER — Emergency Department (HOSPITAL_COMMUNITY)
Admission: EM | Admit: 2021-09-25 | Discharge: 2021-09-25 | Disposition: A | Discharge status: Home / Self Care | Payer: Medicare Other | Attending: Emergency Medicine | Admitting: Emergency Medicine

## 2021-09-25 DIAGNOSIS — Z7982 Long term (current) use of aspirin: Secondary | ICD-10-CM | POA: Insufficient documentation

## 2021-09-25 DIAGNOSIS — K5732 Diverticulitis of large intestine without perforation or abscess without bleeding: Secondary | ICD-10-CM | POA: Diagnosis not present

## 2021-09-25 DIAGNOSIS — D72829 Elevated white blood cell count, unspecified: Secondary | ICD-10-CM | POA: Insufficient documentation

## 2021-09-25 DIAGNOSIS — Z794 Long term (current) use of insulin: Secondary | ICD-10-CM | POA: Diagnosis not present

## 2021-09-25 DIAGNOSIS — Z7984 Long term (current) use of oral hypoglycemic drugs: Secondary | ICD-10-CM | POA: Insufficient documentation

## 2021-09-25 DIAGNOSIS — K5792 Diverticulitis of intestine, part unspecified, without perforation or abscess without bleeding: Secondary | ICD-10-CM | POA: Diagnosis not present

## 2021-09-25 DIAGNOSIS — R1032 Left lower quadrant pain: Secondary | ICD-10-CM | POA: Diagnosis present

## 2021-09-25 DIAGNOSIS — Z79899 Other long term (current) drug therapy: Secondary | ICD-10-CM | POA: Diagnosis not present

## 2021-09-25 LAB — COMPREHENSIVE METABOLIC PANEL
ALT: 18 U/L (ref 0–44)
AST: 20 U/L (ref 15–41)
Albumin: 4.2 g/dL (ref 3.5–5.0)
Alkaline Phosphatase: 78 U/L (ref 38–126)
Anion gap: 11 (ref 5–15)
BUN: 20 mg/dL (ref 8–23)
CO2: 24 mmol/L (ref 22–32)
Calcium: 9.3 mg/dL (ref 8.9–10.3)
Chloride: 102 mmol/L (ref 98–111)
Creatinine, Ser: 0.91 mg/dL (ref 0.44–1.00)
GFR, Estimated: >60 mL/min (ref >60)
Glucose, Bld: 110 mg/dL — ABNORMAL HIGH (ref 70–99)
Potassium: 3.5 mmol/L (ref 3.5–5.1)
Sodium: 137 mmol/L (ref 135–145)
Total Bilirubin: 0.7 mg/dL (ref 0.3–1.2)
Total Protein: 7.7 g/dL (ref 6.5–8.1)

## 2021-09-25 LAB — URINALYSIS, ROUTINE W REFLEX MICROSCOPIC
Bilirubin Urine: NEGATIVE
Glucose, UA: >=500 mg/dL — AB
Hgb urine dipstick: NEGATIVE
Ketones, ur: NEGATIVE mg/dL
Leukocytes,Ua: NEGATIVE
Nitrite: NEGATIVE
Protein, ur: NEGATIVE mg/dL
Specific Gravity, Urine: 1.017 (ref 1.005–1.030)
pH: 5 (ref 5.0–8.0)

## 2021-09-25 LAB — CBC
HCT: 43.4 % (ref 36.0–46.0)
Hemoglobin: 13.9 g/dL (ref 12.0–15.0)
MCH: 26.5 pg (ref 26.0–34.0)
MCHC: 32 g/dL (ref 30.0–36.0)
MCV: 82.7 fL (ref 80.0–100.0)
Platelets: 320 10*3/uL (ref 150–400)
RBC: 5.25 MIL/uL — ABNORMAL HIGH (ref 3.87–5.11)
RDW: 14 % (ref 11.5–15.5)
WBC: 10.7 10*3/uL — ABNORMAL HIGH (ref 4.0–10.5)
nRBC: 0 % (ref 0.0–0.2)

## 2021-09-25 LAB — LIPASE, BLOOD: Lipase: 28 U/L (ref 11–51)

## 2021-09-25 LAB — URINE CULTURE

## 2021-09-25 MED ORDER — HYDROCODONE-ACETAMINOPHEN 5-325 MG PO TABS: 2.0000 | ORAL_TABLET | Freq: Two times a day (BID) | ORAL | 0 refills | Status: DC | PRN

## 2021-09-25 MED ORDER — AMOXICILLIN-POT CLAVULANATE 875-125 MG PO TABS
1.0000 | ORAL_TABLET | Freq: Once | ORAL | Status: AC
  Administered 2021-09-25: 1 via ORAL
  Filled 2021-09-25: qty 1

## 2021-09-25 MED ORDER — HYDROCODONE-ACETAMINOPHEN 5-325 MG PO TABS
1.0000 | ORAL_TABLET | Freq: Once | ORAL | Status: AC
  Administered 2021-09-25: 1 via ORAL
  Filled 2021-09-25: qty 1

## 2021-09-25 MED ORDER — AMOXICILLIN-POT CLAVULANATE 875-125 MG PO TABS: 1.0000 | ORAL_TABLET | Freq: Two times a day (BID) | ORAL | 0 refills | Status: DC

## 2021-09-25 NOTE — ED Triage Notes (Signed)
Pt to the ED with pain in her lower left abdomen that started 3 days go.  Pt states her last normal BM was at 1450.

## 2021-09-25 NOTE — Discharge Instructions (Addendum)
You are seen in the ER for left-sided abdominal pain.  You have a condition called diverticulosis (instructions provided).  Diverticulosis can lead to a condition called diverticulitis, which causes left-sided abdominal pain.  We suspect that you currently have diverticulitis causing the pain.  Please take the antibiotics that are prescribed.  Take Tylenol every 6 hours.  Take Vicodin that is prescribed only if the pain is excruciating.  Return to the ER if your pain starts getting worse, you have severe nausea, vomiting, sweats, bloody stools.

## 2021-09-26 NOTE — ED Provider Notes (Signed)
[facility] EMERGENCY DEPARTMENT Provider Note   CSN: [id number] Arrival date & time: 09/25/21  1352     History  Chief Complaint  Patient presents with   Abdominal Pain    Sara Stuart is a 66 y.o. female.  HPI     66 year old female comes in with chief complaint of lower abdominal pain.  Her pain started 3 days ago.  Pain is located only on the left side, and has been constant.  She had seen her PCP and was started on Macrobid.  She denies any burning with urination, urinary frequency.  Patient also denies any bloody stools, diarrhea.  No history of pain like this before.  She has history of gynecologic surgery, but denies any history of large fibroids, ovarian cyst.  No current vaginal bleeding, fevers, chills.  Home Medications Prior to Admission medications   Medication Sig Start Date End Date Taking? Authorizing Provider  amoxicillin-clavulanate (AUGMENTIN) 875-125 MG tablet Take 1 tablet by mouth every 12 (twelve) hours. 09/25/21  Yes Derwood Kaplan, MD  HYDROcodone-acetaminophen (NORCO/VICODIN) 5-325 MG tablet Take 2 tablets by mouth every 12 (twelve) hours as needed for severe pain. 09/25/21  Yes Derwood Kaplan, MD  acetaminophen (TYLENOL) 500 MG tablet Take 1 tablet (500 mg total) by mouth every 6 (six) hours as needed for moderate pain. 09/18/21   Daryll Drown, NP  aspirin EC 81 MG tablet Take 1 tablet (81 mg total) by mouth daily. 12/30/12   Catarina Hartshorn, MD  baclofen (LIORESAL) 10 MG tablet Take 1 tablet (10 mg total) by mouth 3 (three) times daily as needed. 05/10/21   Gwenlyn Fudge, FNP  Continuous Blood Gluc Receiver (FREESTYLE LIBRE 2 READER) DEVI Use to test blood sugar continuously. DX: E11.65 05/09/21   Gwenlyn Fudge, FNP  Continuous Blood Gluc Sensor (FREESTYLE LIBRE 2 SENSOR) MISC Use to test blood sugar continuously.  Replace sensor every 14 days. DX: E11.65 05/09/21   Gwenlyn Fudge, FNP  dapagliflozin propanediol (FARXIGA) 10 MG TABS tablet Take 1 tablet  (10 mg total) by mouth daily before breakfast. 07/21/21   Gwenlyn Fudge, FNP  diltiazem (DILT-XR) 120 MG 24 hr capsule Take 1 capsule (120 mg total) by mouth daily. 08/11/21   Gwenlyn Fudge, FNP  fluticasone (FLONASE) 50 MCG/ACT nasal spray Place 2 sprays into both nostrils daily as needed. 06/11/17   [provider]  gabapentin (NEURONTIN) 100 MG capsule Take 1 capsule (100 mg total) by mouth 3 (three) times daily. 06/23/21   Oliver Barre, MD  glucose blood test strip Test BS four times daily Dx E11.65 12/01/19   Delynn Flavin M, DO  ibuprofen (ADVIL) 800 MG tablet TAKE 1 TABLET BY MOUTH EVERY 8 HOURS AS NEEDED FOR MODERATE PAIN 07/12/21   Gwenlyn Fudge, FNP  insulin degludec (TRESIBA FLEXTOUCH) 100 UNIT/ML FlexTouch Pen Inject 26 Units into the skin daily. Patient taking differently: Inject 20-26 Units into the skin daily. 01/04/21   Gwenlyn Fudge, FNP  insulin isophane & regular human KwikPen (HUMULIN 70/30 KWIKPEN) (70-30) 100 UNIT/ML KwikPen Inject 35 Units into the skin with breakfast, with lunch, and with evening meal. Patient taking differently: Inject 20-35 Units into the skin with breakfast, with lunch, and with evening meal. 01/10/21   [name] F, FNP  Insulin Pen Needle (B-D ULTRAFINE III SHORT PEN) 31G X 8 MM MISC 1 each by Does not apply route as directed. 03/21/17   Roma Kayser, MD  lisinopril (ZESTRIL) 2.5  MG tablet Take 1 tablet (2.5 mg total) by mouth daily. 05/10/21   Loman Brooklyn, FNP  metFORMIN (GLUCOPHAGE-XR) 500 MG 24 hr tablet Take 2 tablets (1,000 mg total) by mouth 2 (two) times daily. 08/11/21   Loman Brooklyn, FNP  metoprolol tartrate (LOPRESSOR) 25 MG tablet Take 1 tablet by mouth once daily 08/08/21   Loman Brooklyn, FNP  nitrofurantoin, macrocrystal-monohydrate, (MACROBID) 100 MG capsule Take 1 capsule (100 mg total) by mouth 2 (two) times daily. 1 po BId 09/21/21   Ivy Lynn, NP  nitroGLYCERIN (NITROSTAT) 0.4 MG SL tablet  DISSOLVE ONE TABLET UNDER THE TONGUE EVERY 5 MINUTES AS NEEDED FOR CHEST PAIN.  DO NOT EXCEED A TOTAL OF 3 DOSES IN 15 MINUTES 08/04/21   Arnoldo Lenis, MD  omeprazole (PRILOSEC) 40 MG capsule Take 1 capsule by mouth once daily 07/12/21   Loman Brooklyn, FNP  rosuvastatin (CRESTOR) 10 MG tablet Take 1 tablet by mouth once daily 09/19/21   Loman Brooklyn, FNP  saxagliptin HCl (ONGLYZA) 5 MG TABS tablet Take 1 tablet (5 mg total) by mouth daily. 07/21/21   Loman Brooklyn, FNP  spironolactone (ALDACTONE) 25 MG tablet Take 1/2 (one-half) tablet by mouth once daily 08/11/21   Loman Brooklyn, FNP  traZODone (DESYREL) 50 MG tablet TAKE 1 TABLET BY MOUTH AT BEDTIME 07/18/21   Loman Brooklyn, FNP  triamterene-hydrochlorothiazide (DYAZIDE) 37.5-25 MG capsule Take 1 each (1 capsule total) by mouth daily. 08/11/21   Loman Brooklyn, FNP      Allergies    Trulicity [dulaglutide]    Review of Systems   Review of Systems  All other systems reviewed and are negative.   Physical Exam Updated Vital Signs BP 120/82 (BP Location: Right Arm)   Pulse 70   Temp 98 F (36.7 C) (Oral)   Resp 17   Ht 5\' 1"  (1.549 m)   Wt 91.2 kg   SpO2 100%   BMI 37.98 kg/m  Physical Exam Vitals and nursing note reviewed.  Constitutional:      Appearance: She is well-developed.  HENT:     Head: Atraumatic.  Cardiovascular:     Rate and Rhythm: Normal rate.  Pulmonary:     Effort: Pulmonary effort is normal.  Abdominal:     Tenderness: There is abdominal tenderness in the left lower quadrant. There is no guarding or rebound.  Musculoskeletal:     Cervical back: Normal range of motion and neck supple.  Skin:    General: Skin is warm and dry.  Neurological:     Mental Status: She is alert and oriented to person, place, and time.     ED Results / Procedures / Treatments   Labs (all labs ordered are listed, but only abnormal results are displayed) Labs Reviewed  COMPREHENSIVE METABOLIC PANEL -  Abnormal; Notable for the following components:      Result Value   Glucose, Bld 110 (*)    All other components within normal limits  CBC - Abnormal; Notable for the following components:   WBC 10.7 (*)    RBC 5.25 (*)    All other components within normal limits  URINALYSIS, ROUTINE W REFLEX MICROSCOPIC - Abnormal; Notable for the following components:   Glucose, UA >=500 (*)    Bacteria, UA RARE (*)    All other components within normal limits  LIPASE, BLOOD    EKG None  Radiology No results found.  Procedures Procedures  Medications Ordered in ED Medications  amoxicillin-clavulanate (AUGMENTIN) 875-125 MG per tablet 1 tablet (1 tablet Oral Given 09/25/21 1813)  HYDROcodone-acetaminophen (NORCO/VICODIN) 5-325 MG per tablet 1 tablet (1 tablet Oral Given 09/25/21 1813)    ED Course/ Medical Decision Making/ A&P                           Medical Decision Making Amount and/or Complexity of Data Reviewed Labs: ordered.  Risk Prescription drug management.   This patient presents to the ED with chief complaint(s) of left lower quadrant abdominal pain that has been present constantly with pertinent past medical history of CT and endoscopic diagnosis of diverticulosis and recent treatment with Macrobid for suspected UTI for the same discomfort.The complaint involves an extensive differential diagnosis and also carries with it a high risk of complications and morbidity.    The differential diagnosis includes diverticulitis with or without complication such as perforated viscus, abscess.  Other possibilities include kidney stone.  Low clinical suspicion for cystitis or pyelonephritis but they were also considered in the differential diagnosis.  Patient has focal left lower quadrant tenderness without any rebound or guarding.  No evidence of peritonitis.  0 SIRS criteria during my assessment.   I have independently reviewed and interpreted patient's labs : White count is at  10.7, UA is reassuring, no renal failure, no hematuria.  Based on this, low concerns for intra-abdominal abscess or perforated viscus.  Additional history obtained: Records reviewed  patient's previous CT scan from several years ago that showed sigmoid diverticulosis and a colonoscopy from 2 or 3 years ago which also revealed diverticulosis.   Treatment and Reassessment: Based on clinical exam, history and review of patient's chart and labs, my suspicion is high clinically for diverticulitis.  I discussed this concern with the patient and to proceed with treatment with Augmentin and for her to return to the ER if her symptoms gets worse.  Patient is comfortable with it.  With this approach, will avoid CT scan which I think is not going to provide significant yield.   Consideration for admission or further workup: CT abdomen pelvis considered given patient has no history of diverticulitis, but patient is comfortable with clinical diagnosis and with wait and watch approach.  Final Clinical Impression(s) / ED Diagnoses Final diagnoses:  LLQ abdominal pain  Diverticulitis of colon without hemorrhage    Rx / DC Orders ED Discharge Orders          Ordered    amoxicillin-clavulanate (AUGMENTIN) 875-125 MG tablet  Every 12 hours        09/25/21 1755    HYDROcodone-acetaminophen (NORCO/VICODIN) 5-325 MG tablet  Every 12 hours PRN        09/25/21 1757              Derwood Kaplan, MD 09/26/21 1759

## 2021-10-02 ENCOUNTER — Ambulatory Visit (INDEPENDENT_AMBULATORY_CARE_PROVIDER_SITE_OTHER): Payer: Medicare Other | Admitting: Family Medicine

## 2021-10-02 ENCOUNTER — Encounter: Payer: Self-pay | Admitting: Family Medicine

## 2021-10-02 VITALS — BP 116/81 | HR 80 | Temp 97.0°F | Ht 61.0 in | Wt 200.2 lb

## 2021-10-02 DIAGNOSIS — R1032 Left lower quadrant pain: Secondary | ICD-10-CM

## 2021-10-02 MED ORDER — CIPROFLOXACIN HCL 500 MG PO TABS: 500.0000 mg | ORAL_TABLET | Freq: Two times a day (BID) | ORAL | 0 refills | Status: DC

## 2021-10-02 MED ORDER — METRONIDAZOLE 500 MG PO TABS: 500.0000 mg | ORAL_TABLET | Freq: Two times a day (BID) | ORAL | 0 refills | Status: DC

## 2021-10-02 NOTE — Progress Notes (Signed)
Subjective:  Patient ID: Sara Stuart, female    DOB: [birth year]  Age: 66 y.o. MRN: [id number]  CC: Diverticulitis   HPI Sara Stuart presents for LLQ pain onset 2.5 weeks ago. interfering with walking. Having LBM 6-7 a day for 20 years. Initial XR indicated constipation so she was started on miralax. Evidence for UTI so started on macrodantin. No relief so she went to E.D. due to colonoscopy showing diverticulosis she was dx ed with diverticulitis. Started on amoxil. Glucose 130 today at home. Has been low at times. Appetite variable. Pain is a pinching sensation Also reporting left hip pain.      10/02/2021    4:00 PM 09/18/2021    2:55 PM 08/11/2021    1:32 PM  Depression screen PHQ 2/9  Decreased Interest 0 0 0  Down, Depressed, Hopeless 0 0 0  PHQ - 2 Score 0 0 0  Altered sleeping  0 0  Tired, decreased energy  0 0  Change in appetite  0 0  Feeling bad or failure about yourself   0 0  Trouble concentrating  0 0  Moving slowly or fidgety/restless  0 0  Suicidal thoughts  0 0  PHQ-9 Score  0 0  Difficult doing work/chores   Not difficult at all    History Sara Stuart has a past medical history of Arthritis, Chronic back pain, DDD (degenerative disc disease), lumbosacral, Diabetes mellitus without complication ([place]) (diagnosed in 02/2014), GERD (gastroesophageal reflux disease), Gout, Hypertension, Nocturia, Polio, PONV (postoperative nausea and vomiting), and Wears glasses.   She has a past surgical history that includes Foot surgery; surgery for polio; bladder tacked ; Back surgery (07/2012); Tubal ligation; Colonoscopy; Total abdominal hysterectomy (2002); and Shoulder arthroscopy with rotator cuff repair (Right, 06/15/2021).   Her family history includes Colon cancer in her mother; Diabetes in her father; Heart attack in her father; Thyroid disease in her father.She reports that she has never smoked. She has never used smokeless tobacco. She reports that she does not drink alcohol and  does not use drugs.    ROS Review of Systems  Constitutional:  Positive for appetite change.  HENT: Negative.    Eyes:  Negative for visual disturbance.  Respiratory:  Negative for shortness of breath.   Cardiovascular:  Negative for chest pain.  Gastrointestinal:  Positive for abdominal pain, constipation and diarrhea. Negative for nausea and vomiting.  Musculoskeletal:  Positive for arthralgias (left hip).    Objective:  BP 116/81   Pulse 80   Temp (!) 97 F (36.1 C)   Ht '5\' 1"'  (1.549 m)   Wt 200 lb 3.2 oz (90.8 kg)   SpO2 94%   BMI 37.83 kg/m   BP Readings from Last 3 Encounters:  10/02/21 116/81  09/25/21 120/82  09/18/21 113/73    Wt Readings from Last 3 Encounters:  10/02/21 200 lb 3.2 oz (90.8 kg)  09/25/21 201 lb (91.2 kg)  09/18/21 201 lb (91.2 kg)     Physical Exam Constitutional:      General: She is not in acute distress.    Appearance: She is well-developed.  HENT:     Head: Normocephalic and atraumatic.  Eyes:     Conjunctiva/sclera: Conjunctivae normal.     Pupils: Pupils are equal, round, and reactive to light.  Neck:     Thyroid: No thyromegaly.  Cardiovascular:     Rate and Rhythm: Normal rate and regular rhythm.     Heart sounds: Normal heart sounds. No  murmur heard. Pulmonary:     Effort: Pulmonary effort is normal. No respiratory distress.     Breath sounds: Normal breath sounds. No wheezing or rales.  Abdominal:     General: Bowel sounds are normal. There is no distension.     Palpations: Abdomen is soft. There is no mass.     Tenderness: There is abdominal tenderness (moderate LLQ). There is no left CVA tenderness, guarding or rebound.  Musculoskeletal:        General: Normal range of motion.     Cervical back: Normal range of motion and neck supple.  Lymphadenopathy:     Cervical: No cervical adenopathy.  Skin:    General: Skin is warm and dry.  Neurological:     Mental Status: She is alert and oriented to person, place, and  time.  Psychiatric:        Behavior: Behavior normal.        Thought Content: Thought content normal.        Judgment: Judgment normal.       Assessment & Plan:   Sara Stuart was seen today for diverticulitis.  Diagnoses and all orders for this visit:  Left lower quadrant abdominal pain -     CT ABDOMEN PELVIS W CONTRAST; Future -     CBC with Differential/Platelet -     CMP14+EGFR -     Lipase -     Urinalysis -     Urine Culture  Other orders -     metroNIDAZOLE (FLAGYL) 500 MG tablet; Take 1 tablet (500 mg total) by mouth 2 (two) times daily. -     ciprofloxacin (CIPRO) 500 MG tablet; Take 1 tablet (500 mg total) by mouth 2 (two) times daily.       I have discontinued Sara Stuart "Sara Stuart"'s nitrofurantoin (macrocrystal-monohydrate) and amoxicillin-clavulanate. I am also having her start on metroNIDAZOLE and ciprofloxacin. Additionally, I am having her maintain her aspirin EC, Insulin Pen Needle, fluticasone, glucose blood, Tresiba FlexTouch, HumuLIN 70/30 KwikPen, FreeStyle Libre 2 Sensor, [employer] 2 Reader, baclofen, lisinopril, gabapentin, omeprazole, ibuprofen, traZODone, dapagliflozin propanediol, saxagliptin HCl, nitroGLYCERIN, metoprolol tartrate, diltiazem, metFORMIN, spironolactone, triamterene-hydrochlorothiazide, acetaminophen, rosuvastatin, and HYDROcodone-acetaminophen.  Allergies as of 10/02/2021       Reactions   Trulicity [dulaglutide] Other (See Comments)   GI intolerance        Medication List        Accurate as of October 02, 2021  6:36 PM. If you have any questions, ask your nurse or doctor.          STOP taking these medications    amoxicillin-clavulanate 875-125 MG tablet Commonly known as: AUGMENTIN Stopped by: Claretta Fraise, MD   nitrofurantoin (macrocrystal-monohydrate) 100 MG capsule Commonly known as: Macrobid Stopped by: Claretta Fraise, MD       TAKE these medications    acetaminophen 500 MG tablet Commonly known as:  TYLENOL Take 1 tablet (500 mg total) by mouth every 6 (six) hours as needed for moderate pain.   aspirin EC 81 MG tablet Take 1 tablet (81 mg total) by mouth daily.   baclofen 10 MG tablet Commonly known as: LIORESAL Take 1 tablet (10 mg total) by mouth 3 (three) times daily as needed.   ciprofloxacin 500 MG tablet Commonly known as: Cipro Take 1 tablet (500 mg total) by mouth 2 (two) times daily. Started by: Claretta Fraise, MD   dapagliflozin propanediol 10 MG Tabs tablet Commonly known as: Farxiga Take 1 tablet (10 mg total) by  mouth daily before breakfast.   diltiazem 120 MG 24 hr capsule Commonly known as: Dilt-XR Take 1 capsule (120 mg total) by mouth daily.   fluticasone 50 MCG/ACT nasal spray Commonly known as: FLONASE Place 2 sprays into both nostrils daily as needed.   FreeStyle Libre 2 Reader [place] Use to test blood sugar continuously. DX: E11.65   FreeStyle Libre 2 Sensor Misc Use to test blood sugar continuously.  Replace sensor every 14 days. DX: E11.65   gabapentin 100 MG capsule Commonly known as: NEURONTIN Take 1 capsule (100 mg total) by mouth 3 (three) times daily.   glucose blood test strip Test BS four times daily Dx E11.65   HumuLIN 70/30 KwikPen (70-30) 100 UNIT/ML KwikPen Generic drug: insulin isophane & regular human KwikPen Inject 35 Units into the skin with breakfast, with lunch, and with evening meal. What changed: additional instructions   HYDROcodone-acetaminophen 5-325 MG tablet Commonly known as: NORCO/VICODIN Take 2 tablets by mouth every 12 (twelve) hours as needed for severe pain.   ibuprofen 800 MG tablet Commonly known as: ADVIL TAKE 1 TABLET BY MOUTH EVERY 8 HOURS AS NEEDED FOR MODERATE PAIN   Insulin Pen Needle 31G X 8 MM Misc Commonly known as: B-D ULTRAFINE III SHORT PEN 1 each by Does not apply route as directed.   lisinopril 2.5 MG tablet Commonly known as: ZESTRIL Take 1 tablet (2.5 mg total) by mouth daily.    metFORMIN 500 MG 24 hr tablet Commonly known as: GLUCOPHAGE-XR Take 2 tablets (1,000 mg total) by mouth 2 (two) times daily.   metoprolol tartrate 25 MG tablet Commonly known as: LOPRESSOR Take 1 tablet by mouth once daily   metroNIDAZOLE 500 MG tablet Commonly known as: FLAGYL Take 1 tablet (500 mg total) by mouth 2 (two) times daily. Started by: Claretta Fraise, MD   nitroGLYCERIN 0.4 MG SL tablet Commonly known as: NITROSTAT DISSOLVE ONE TABLET UNDER THE TONGUE EVERY 5 MINUTES AS NEEDED FOR CHEST PAIN.  DO NOT EXCEED A TOTAL OF 3 DOSES IN 15 MINUTES   omeprazole 40 MG capsule Commonly known as: PRILOSEC Take 1 capsule by mouth once daily   rosuvastatin 10 MG tablet Commonly known as: CRESTOR Take 1 tablet by mouth once daily   saxagliptin HCl 5 MG Tabs tablet Commonly known as: Onglyza Take 1 tablet (5 mg total) by mouth daily.   spironolactone 25 MG tablet Commonly known as: ALDACTONE Take 1/2 (one-half) tablet by mouth once daily   traZODone 50 MG tablet Commonly known as: DESYREL TAKE 1 TABLET BY MOUTH AT BEDTIME   Tresiba FlexTouch 100 UNIT/ML FlexTouch Pen Generic drug: insulin degludec Inject 26 Units into the skin daily. What changed: how much to take   triamterene-hydrochlorothiazide 37.5-25 MG capsule Commonly known as: DYAZIDE Take 1 each (1 capsule total) by mouth daily.         Follow-up: Return in about 1 week (around 10/09/2021), or if symptoms worsen or fail to improve, for abd pain.  Claretta Fraise, M.D.

## 2021-10-03 ENCOUNTER — Ambulatory Visit (INDEPENDENT_AMBULATORY_CARE_PROVIDER_SITE_OTHER): Payer: Medicare Other | Admitting: Pharmacist

## 2021-10-03 ENCOUNTER — Ambulatory Visit (HOSPITAL_COMMUNITY)
Admission: RE | Admit: 2021-10-03 | Discharge: 2021-10-03 | Disposition: A | Discharge status: Home / Self Care | Payer: Medicare Other | Source: Ambulatory Visit | Attending: Family Medicine | Admitting: Family Medicine

## 2021-10-03 ENCOUNTER — Ambulatory Visit: Payer: Medicare Other | Admitting: Family Medicine

## 2021-10-03 DIAGNOSIS — Z794 Long term (current) use of insulin: Secondary | ICD-10-CM

## 2021-10-03 DIAGNOSIS — I700 Atherosclerosis of aorta: Secondary | ICD-10-CM | POA: Diagnosis not present

## 2021-10-03 DIAGNOSIS — R1032 Left lower quadrant pain: Secondary | ICD-10-CM | POA: Diagnosis not present

## 2021-10-03 DIAGNOSIS — E782 Mixed hyperlipidemia: Secondary | ICD-10-CM

## 2021-10-03 DIAGNOSIS — E1165 Type 2 diabetes mellitus with hyperglycemia: Secondary | ICD-10-CM | POA: Diagnosis not present

## 2021-10-03 LAB — CBC WITH DIFFERENTIAL/PLATELET
Basophils Absolute: 0.1 10*3/uL (ref 0.0–0.2)
Basos: 1 %
EOS (ABSOLUTE): 0.4 10*3/uL (ref 0.0–0.4)
Eos: 4 %
Hematocrit: 43.1 % (ref 34.0–46.6)
Hemoglobin: 14.1 g/dL (ref 11.1–15.9)
Immature Grans (Abs): 0 10*3/uL (ref 0.0–0.1)
Immature Granulocytes: 0 %
Lymphocytes Absolute: 2.8 10*3/uL (ref 0.7–3.1)
Lymphs: 26 %
MCH: 26.8 pg (ref 26.6–33.0)
MCHC: 32.7 g/dL (ref 31.5–35.7)
MCV: 82 fL (ref 79–97)
Monocytes Absolute: 0.7 10*3/uL (ref 0.1–0.9)
Monocytes: 7 %
Neutrophils Absolute: 6.6 10*3/uL (ref 1.4–7.0)
Neutrophils: 62 %
Platelets: 317 10*3/uL (ref 150–450)
RBC: 5.27 x10E6/uL (ref 3.77–5.28)
RDW: 13.5 % (ref 11.7–15.4)
WBC: 10.5 10*3/uL (ref 3.4–10.8)

## 2021-10-03 LAB — URINALYSIS
Bilirubin, UA: NEGATIVE
Ketones, UA: NEGATIVE
Leukocytes,UA: NEGATIVE
Nitrite, UA: NEGATIVE
Protein,UA: NEGATIVE
RBC, UA: NEGATIVE
Specific Gravity, UA: 1.02 (ref 1.005–1.030)
Urobilinogen, Ur: 0.2 mg/dL (ref 0.2–1.0)
pH, UA: 5.5 (ref 5.0–7.5)

## 2021-10-03 LAB — CMP14+EGFR
ALT: 18 IU/L (ref 0–32)
AST: 18 IU/L (ref 0–40)
Albumin/Globulin Ratio: 1.6 (ref 1.2–2.2)
Albumin: 4.5 g/dL (ref 3.9–4.9)
Alkaline Phosphatase: 85 IU/L (ref 44–121)
BUN/Creatinine Ratio: 25 (ref 12–28)
BUN: 23 mg/dL (ref 8–27)
Bilirubin Total: 0.4 mg/dL (ref 0.0–1.2)
CO2: 22 mmol/L (ref 20–29)
Calcium: 9.7 mg/dL (ref 8.7–10.3)
Chloride: 99 mmol/L (ref 96–106)
Creatinine, Ser: 0.93 mg/dL (ref 0.57–1.00)
Globulin, Total: 2.8 g/dL (ref 1.5–4.5)
Glucose: 123 mg/dL — ABNORMAL HIGH (ref 70–99)
Potassium: 4 mmol/L (ref 3.5–5.2)
Sodium: 139 mmol/L (ref 134–144)
Total Protein: 7.3 g/dL (ref 6.0–8.5)
eGFR: 68 mL/min/{1.73_m2} (ref >59)

## 2021-10-03 LAB — URINE CULTURE: Organism ID, Bacteria: NO GROWTH

## 2021-10-03 LAB — LIPASE: Lipase: 12 U/L — ABNORMAL LOW (ref 14–72)

## 2021-10-03 MED ORDER — IOHEXOL 300 MG/ML  SOLN
100.0000 mL | Freq: Once | INTRAMUSCULAR | Status: AC | PRN
  Administered 2021-10-03: 100 mL via INTRAVENOUS

## 2021-10-03 NOTE — Progress Notes (Signed)
Hello Jaiah,  Your lab result is normal and/or stable.Some minor variations that are not significant are commonly marked abnormal, but do not represent any medical problem for you.  Best regards, Turki Tapanes, M.D.

## 2021-10-03 NOTE — Progress Notes (Signed)
    Pharmacy Note   10/03/2021 Name:  Sara Stuart    MRN:  [id number]      DOB:  1955/01/05   Summary:  Diabetes: Goal on Track (progressing): NO "Uncontrolled"- A1C 7.0-->8.2%,  A1C continues to increase; patient s/p ortho surgery   Changes today to diabetes regimen:    Decrease meal time to humalog 20 units Tresiba qhs to 26 units Need to overhaul regimen having hypos/hyper Libre 2 cgm now via ADS Current treatment: Tresiba 25 units AT BEDTIME, Humulin 70/30 mix pen (10-30 units with each meal), Onglyza 5mg , Farxiga 10mg , metformin; Humulin 70/30 mix pen is familiar to patient since her husband is also on--we have tried x2 to transition her over to basal/bolus, but this has been unsuccessful.  Patient resorts back to previous regimen if she begins to run high (concern for hypoglycemia/overlap with mix+basal+other meds) Noted intolerance to Trulicity (may consider Rybelsus, low dose ozempic, mounjaro to decrease insulin requirements) Denies personal and family history of Medullary thyroid cancer (MTC) Insulin changes as above Continue ongylza (free via az&me patient assistance program) Continue farxiga (free via az&me patient assistance program) Continue metformin Current glucose readings: fasting glucose: <200, post prandial glucose: 200s Reports hypoglycemia has improved, but still occurring due to overlapped insulin and inconsistent diet The patient is asked to make an attempt to improve diet and exercise patterns to aid in medical management of this problem. Discussed meal planning options and Plate method for healthy eating Avoid sugary drinks and desserts Incorporate balanced protein, non starchy veggies, 1 serving of carbohydrate with each meal Increase water intake Increase physical activity as able Current exercise: n/a-->encouraged Recommended heart healthy diet, healthy plate method handout given; work on transition DM regimen Assessed patient finances. Patient approved for  [employer] patient assistance for Farxiga and Onglyza (sub for januvia-easier to get via patient assistance)--patient approved until 12/31/21, Humalog mix insulin 70/30--free via [employer] patient assistance   Hyperlipidemia:  Goal on Track (progressing): YES. Controlled-LDL 78-->67 current treatment: rosuvastatin 10mg  daily; now at goal;some myopathy with higher dose statin Medications previously tried: n/a  Current dietary patterns: heart healthy diet, healthy plate method handout given Counseled on diet, may increase statin if not at goal with A1c control and lifestyle changes Lipid Panel          Component Value Date/Time    CHOL 146 05/10/2021 0857    TRIG 122 05/10/2021 0857    HDL 57 05/10/2021 0857    CHOLHDL 2.6 05/10/2021 0857    CHOLHDL 3.8 05/02/2017 1617    LDLCALC 67 05/10/2021 0857    LDLCALC 111 (H) 05/02/2017 1617    LABVLDL 22 05/10/2021 0857      Hypertension: Patient at goal <130/80 BP home report-ranges from 110s-130s/65-80 Continue current regimen--on ACEi   Patient Goals/Self-Care Activities patient will:  - take medications as prescribed as evidenced by patient report and record review check glucose DAILY OR IF SYMPTOMATIC, document, and provide at future appointments collaborate with provider on medication access solutions target a minimum of 150 minutes of moderate intensity    Regina Eck, PharmD, BCPS Clinical Pharmacist, [place]  II Phone [phone]

## 2021-10-05 ENCOUNTER — Other Ambulatory Visit: Payer: Self-pay | Admitting: Orthopedic Surgery

## 2021-10-06 ENCOUNTER — Telehealth: Payer: Self-pay | Admitting: Family Medicine

## 2021-10-06 NOTE — Telephone Encounter (Signed)
Patient is still having issues with diverticulitis and would like something to be called in. Please call back and advise.

## 2021-10-07 DIAGNOSIS — E1165 Type 2 diabetes mellitus with hyperglycemia: Secondary | ICD-10-CM | POA: Diagnosis not present

## 2021-10-09 NOTE — Telephone Encounter (Signed)
Patient aware and will go to ER

## 2021-10-14 ENCOUNTER — Other Ambulatory Visit: Payer: Self-pay | Admitting: Family Medicine

## 2021-10-18 ENCOUNTER — Other Ambulatory Visit: Payer: Self-pay | Admitting: Family Medicine

## 2021-10-19 ENCOUNTER — Other Ambulatory Visit: Payer: Self-pay | Admitting: Family Medicine

## 2021-10-20 ENCOUNTER — Telehealth: Payer: Self-pay

## 2021-10-20 NOTE — Telephone Encounter (Signed)
Received notification from [employer] regarding RE-ENROLLMENT approval for [employer] Patient assistance approved from 01/01/22 to 01/01/23.  Phone: [phone]

## 2021-10-26 ENCOUNTER — Ambulatory Visit (INDEPENDENT_AMBULATORY_CARE_PROVIDER_SITE_OTHER): Payer: Medicare Other | Admitting: Pharmacist

## 2021-10-26 DIAGNOSIS — E1165 Type 2 diabetes mellitus with hyperglycemia: Secondary | ICD-10-CM | POA: Diagnosis not present

## 2021-10-26 DIAGNOSIS — Z794 Long term (current) use of insulin: Secondary | ICD-10-CM

## 2021-10-26 NOTE — Progress Notes (Signed)
    Pharmacy Note   10/26/2021 Name:  Sara Stuart    MRN:  [id number]      DOB:  [birth year]   Summary:   Diabetes: Goal on Track (progressing): NO "Uncontrolled"- A1C 7.0-->8.2% Changes today to diabetes regimen:           Decrease meal time to humalog 15-20 units Tresiba qhs to 20-25 units Need to overhaul regimen having hypos/hyper--patient to schedule in person visit to review insulins Libre 2 cgm now via ADS Current treatment: Tresiba 20-25 units AT BEDTIME, Humulin 70/30 mix pen (10-30 units with each meal), Onglyza 5mg , Farxiga 10mg , metformin; Humulin 70/30 mix pen is familiar to patient since her husband is also on--we have tried x2 to transition her over to basal/bolus, but this has been unsuccessful.  Patient resorts back to previous regimen if she begins to run high (concern for hypoglycemia/overlap with mix+basal+other meds) Noted intolerance to Trulicity (may consider Rybelsus, low dose ozempic, mounjaro to decrease insulin requirements) Denies personal and family history of Medullary thyroid cancer (MTC) Insulin changes as above Continue ongylza (free via az&me patient assistance program)--this will discontinue in January 2024 Continue farxiga (free via az&me patient assistance program) Continue metformin Current glucose readings: fasting glucose: <200, post prandial glucose: 200s Reports hypoglycemia has improved, but still occurring due to overlapped insulin and inconsistent diet The patient is asked to make an attempt to improve diet and exercise patterns to aid in medical management of this problem. Discussed meal planning options and Plate method for healthy eating Avoid sugary drinks and desserts Incorporate balanced protein, non starchy veggies, 1 serving of carbohydrate with each meal Increase water intake Increase physical activity as able Current exercise: n/a-->encouraged Recommended heart healthy diet, healthy plate method handout given; work on transition DM  regimen Assessed patient finances. Patient [place] for [employer] patient assistance for Farxiga and Onglyza (sub for januvia-easier to get via patient assistance)--patient approved until 12/31/21, Humalog mix insulin 70/30--free via [employer] patient assistance   Hyperlipidemia:  Goal on Track (progressing): YES. Controlled-LDL 78-->67 current treatment: rosuvastatin 10mg  daily; now at goal;some myopathy with higher dose statin Medications previously tried: n/a  Current dietary patterns: heart healthy diet, healthy plate method handout given Counseled on diet, may increase statin if not at goal with A1c control and lifestyle changes Lipid Panel              Component Value Date/Time    CHOL 146 05/10/2021 0857    TRIG 122 05/10/2021 0857    HDL 57 05/10/2021 0857    CHOLHDL 2.6 05/10/2021 0857    CHOLHDL 3.8 05/02/2017 1617    LDLCALC 67 05/10/2021 0857    LDLCALC 111 (H) 05/02/2017 1617    LABVLDL 22 05/10/2021 0857      Hypertension: Patient at goal <130/80 BP home report-ranges from 110s-130s/65-80 Continue current regimen--on ACEi   Patient Goals/Self-Care Activities patient will:  - take medications as prescribed as evidenced by patient report and record review check glucose DAILY OR IF SYMPTOMATIC, document, and provide at future appointments collaborate with provider on medication access solutions target a minimum of 150 minutes of moderate intensity      Regina Eck, PharmD, BCPS Clinical Pharmacist, [place]  II Phone [phone]

## 2021-11-06 ENCOUNTER — Other Ambulatory Visit: Payer: Self-pay | Admitting: Orthopedic Surgery

## 2021-11-06 ENCOUNTER — Other Ambulatory Visit: Payer: Self-pay | Admitting: Family Medicine

## 2021-11-06 DIAGNOSIS — I10 Essential (primary) hypertension: Secondary | ICD-10-CM

## 2021-11-08 DIAGNOSIS — E1165 Type 2 diabetes mellitus with hyperglycemia: Secondary | ICD-10-CM | POA: Diagnosis not present

## 2021-11-09 NOTE — Telephone Encounter (Signed)
Disregard previous note - patient ELIGIBLE for 2024 enrollment. 

## 2021-11-14 ENCOUNTER — Ambulatory Visit: Payer: Medicare Other | Admitting: Family Medicine

## 2021-11-16 ENCOUNTER — Telehealth: Payer: Self-pay | Admitting: Pharmacist

## 2021-11-16 NOTE — Telephone Encounter (Signed)
Please send [employer] re-enrollment for: -Humulin 70/30 30 units into skin 3 times daily with meals (max dose per day-90units) -Tresiba 20 units nightly -Pen needles #[id number] (novo)  Already approved for [employer]  Thank you!

## 2021-11-20 ENCOUNTER — Other Ambulatory Visit: Payer: Self-pay | Admitting: Family Medicine

## 2021-11-20 ENCOUNTER — Ambulatory Visit (INDEPENDENT_AMBULATORY_CARE_PROVIDER_SITE_OTHER): Payer: Medicare Other | Admitting: Family Medicine

## 2021-11-20 ENCOUNTER — Encounter: Payer: Self-pay | Admitting: Family Medicine

## 2021-11-20 ENCOUNTER — Ambulatory Visit (INDEPENDENT_AMBULATORY_CARE_PROVIDER_SITE_OTHER): Payer: Medicare Other

## 2021-11-20 VITALS — BP 116/79 | HR 65 | Temp 97.1°F | Ht 61.0 in | Wt 203.8 lb

## 2021-11-20 DIAGNOSIS — M25551 Pain in right hip: Secondary | ICD-10-CM | POA: Diagnosis not present

## 2021-11-20 DIAGNOSIS — I10 Essential (primary) hypertension: Secondary | ICD-10-CM

## 2021-11-20 DIAGNOSIS — E782 Mixed hyperlipidemia: Secondary | ICD-10-CM

## 2021-11-20 DIAGNOSIS — Z794 Long term (current) use of insulin: Secondary | ICD-10-CM | POA: Diagnosis not present

## 2021-11-20 DIAGNOSIS — E1165 Type 2 diabetes mellitus with hyperglycemia: Secondary | ICD-10-CM

## 2021-11-20 DIAGNOSIS — M25552 Pain in left hip: Secondary | ICD-10-CM

## 2021-11-20 LAB — BAYER DCA HB A1C WAIVED: HB A1C (BAYER DCA - WAIVED): 8.1 % — ABNORMAL HIGH (ref 4.8–5.6)

## 2021-11-20 MED ORDER — GABAPENTIN 300 MG PO CAPS: 300.0000 mg | ORAL_CAPSULE | Freq: Two times a day (BID) | ORAL | 1 refills | Status: DC

## 2021-11-20 NOTE — Progress Notes (Signed)
Subjective:  Patient ID: Sara Stuart, female    DOB: 1955/02/05  Age: 66 y.o. MRN: [id number]  CC: Medical Management of Chronic Issues   HPI Darneshia Demary presents for right hip and buttocks pain. Has bad arthritis according to her back surgeon. Knots in her back and hips.   Worries about hypoglycemia. Reduces her basal to avoid overnight lows.    in for follow-up of elevated cholesterol. Doing well without complaints on current medication. Denies side effects of statin including myalgia and arthralgia and nausea. Currently no chest pain, shortness of breath or other cardiovascular related symptoms noted.      11/20/2021   10:59 AM 10/02/2021    4:00 PM 09/18/2021    2:55 PM  Depression screen PHQ 2/9  Decreased Interest 0 0 0  Down, Depressed, Hopeless 0 0 0  PHQ - 2 Score 0 0 0  Altered sleeping   0  Tired, decreased energy   0  Change in appetite   0  Feeling bad or failure about yourself    0  Trouble concentrating   0  Moving slowly or fidgety/restless   0  Suicidal thoughts   0  PHQ-9 Score   0    History Rudi has a past medical history of Arthritis, Chronic back pain, DDD (degenerative disc disease), lumbosacral, Diabetes mellitus without complication ([place]) (diagnosed in 02/2014), GERD (gastroesophageal reflux disease), Gout, Hypertension, Nocturia, Polio, PONV (postoperative nausea and vomiting), and Wears glasses.   She has a past surgical history that includes Foot surgery; surgery for polio; bladder tacked ; Back surgery (07/2012); Tubal ligation; Colonoscopy; Total abdominal hysterectomy (2002); and Shoulder arthroscopy with rotator cuff repair (Right, 06/15/2021).   Her family history includes Colon cancer in her mother; Diabetes in her father; Heart attack in her father; Thyroid disease in her father.She reports that she has never smoked. She has never used smokeless tobacco. She reports that she does not drink alcohol and does not use drugs.    ROS Review of  Systems  Constitutional: Negative.   HENT: Negative.    Eyes:  Negative for visual disturbance.  Respiratory:  Negative for shortness of breath.   Cardiovascular:  Negative for chest pain.  Gastrointestinal:  Negative for abdominal pain.  Musculoskeletal:  Positive for arthralgias (hips).    Objective:  BP 116/79   Pulse 65   Temp (!) 97.1 F (36.2 C)   Ht _0  (1.549 m)   Wt 203 lb 12.8 oz (92.4 kg)   SpO2 97%   BMI 38.51 kg/m   BP Readings from Last 3 Encounters:  11/20/21 116/79  10/02/21 116/81  09/25/21 120/82    Wt Readings from Last 3 Encounters:  11/20/21 203 lb 12.8 oz (92.4 kg)  10/02/21 200 lb 3.2 oz (90.8 kg)  09/25/21 201 lb (91.2 kg)     Physical Exam Constitutional:      General: She is not in acute distress.    Appearance: She is well-developed. She is obese.  Cardiovascular:     Rate and Rhythm: Normal rate and regular rhythm.  Pulmonary:     Breath sounds: Normal breath sounds.  Musculoskeletal:        General: Normal range of motion.  Skin:    General: Skin is warm and dry.  Neurological:     Mental Status: She is alert and oriented to person, place, and time.       Assessment & Plan:   Aribella was seen today for medical  management of chronic issues.  Diagnoses and all orders for this visit:  Uncontrolled type 2 diabetes mellitus with hyperglycemia, with long-term current use of insulin (HCC) -     Bayer DCA Hb A1c Waived -     Microalbumin / creatinine urine ratio  Mixed hyperlipidemia -     Lipid panel  Essential hypertension, benign -     CBC with Differential/Platelet -     CMP14+EGFR  Bilateral hip pain -     DG HIP UNILAT W OR W/O PELVIS 2-3 VIEWS LEFT; Future -     DG HIP UNILAT W OR W/O PELVIS 2-3 VIEWS RIGHT; Future       I have discontinued Sara Stuart "[name]"'s ciprofloxacin. I am also having her maintain her aspirin EC, Insulin Pen Needle, fluticasone, glucose blood, Tresiba FlexTouch, HumuLIN 70/30 KwikPen,  FreeStyle Libre 2 Sensor, [employer] 2 Reader, baclofen, lisinopril, dapagliflozin propanediol, saxagliptin HCl, nitroGLYCERIN, diltiazem, metFORMIN, spironolactone, triamterene-hydrochlorothiazide, acetaminophen, rosuvastatin, HYDROcodone-acetaminophen, metroNIDAZOLE, gabapentin, omeprazole, traZODone, ibuprofen, and metoprolol tartrate.  Allergies as of 11/20/2021       Reactions   Trulicity [dulaglutide] Other (See Comments)   GI intolerance        Medication List        Accurate as of November 20, 2021 12:13 PM. If you have any questions, ask your nurse or doctor.          STOP taking these medications    ciprofloxacin 500 MG tablet Commonly known as: Cipro Stopped by: Claretta Fraise, MD       TAKE these medications    acetaminophen 500 MG tablet Commonly known as: TYLENOL Take 1 tablet (500 mg total) by mouth every 6 (six) hours as needed for moderate pain.   aspirin EC 81 MG tablet Take 1 tablet (81 mg total) by mouth daily.   baclofen 10 MG tablet Commonly known as: LIORESAL Take 1 tablet (10 mg total) by mouth 3 (three) times daily as needed.   dapagliflozin propanediol 10 MG Tabs tablet Commonly known as: Farxiga Take 1 tablet (10 mg total) by mouth daily before breakfast.   diltiazem 120 MG 24 hr capsule Commonly known as: Dilt-XR Take 1 capsule (120 mg total) by mouth daily.   fluticasone 50 MCG/ACT nasal spray Commonly known as: FLONASE Place 2 sprays into both nostrils daily as needed.   FreeStyle Libre 2 Reader Twodot Use to test blood sugar continuously. DX: E11.65   FreeStyle Libre 2 Sensor Misc Use to test blood sugar continuously.  Replace sensor every 14 days. DX: E11.65   gabapentin 100 MG capsule Commonly known as: NEURONTIN TAKE 1 CAPSULE BY MOUTH THREE TIMES DAILY   glucose blood test strip Test BS four times daily Dx E11.65   HumuLIN 70/30 KwikPen (70-30) 100 UNIT/ML KwikPen Generic drug: insulin isophane & regular human  KwikPen Inject 35 Units into the skin with breakfast, with lunch, and with evening meal. What changed: additional instructions   HYDROcodone-acetaminophen 5-325 MG tablet Commonly known as: NORCO/VICODIN Take 2 tablets by mouth every 12 (twelve) hours as needed for severe pain.   ibuprofen 800 MG tablet Commonly known as: ADVIL TAKE 1 TABLET BY MOUTH EVERY 8 HOURS AS NEEDED FOR MODERATE PAIN   Insulin Pen Needle 31G X 8 MM Misc Commonly known as: B-D ULTRAFINE III SHORT PEN 1 each by Does not apply route as directed.   lisinopril 2.5 MG tablet Commonly known as: ZESTRIL Take 1 tablet (2.5 mg total) by mouth daily.  metFORMIN 500 MG 24 hr tablet Commonly known as: GLUCOPHAGE-XR Take 2 tablets (1,000 mg total) by mouth 2 (two) times daily.   metoprolol tartrate 25 MG tablet Commonly known as: LOPRESSOR Take 1 tablet by mouth once daily   metroNIDAZOLE 500 MG tablet Commonly known as: FLAGYL Take 1 tablet (500 mg total) by mouth 2 (two) times daily.   nitroGLYCERIN 0.4 MG SL tablet Commonly known as: NITROSTAT DISSOLVE ONE TABLET UNDER THE TONGUE EVERY 5 MINUTES AS NEEDED FOR CHEST PAIN.  DO NOT EXCEED A TOTAL OF 3 DOSES IN 15 MINUTES   omeprazole 40 MG capsule Commonly known as: PRILOSEC Take 1 capsule by mouth once daily   rosuvastatin 10 MG tablet Commonly known as: CRESTOR Take 1 tablet by mouth once daily   saxagliptin HCl 5 MG Tabs tablet Commonly known as: Onglyza Take 1 tablet (5 mg total) by mouth daily.   spironolactone 25 MG tablet Commonly known as: ALDACTONE Take 1/2 (one-half) tablet by mouth once daily   traZODone 50 MG tablet Commonly known as: DESYREL TAKE 1 TABLET BY MOUTH AT BEDTIME   Tresiba FlexTouch 100 UNIT/ML FlexTouch Pen Generic drug: insulin degludec Inject 26 Units into the skin daily. What changed: how much to take   triamterene-hydrochlorothiazide 37.5-25 MG capsule Commonly known as: DYAZIDE Take 1 each (1 capsule total) by  mouth daily.         Follow-up: Return in about 3 months (around 02/20/2022).  Claretta Fraise, M.D.

## 2021-11-21 LAB — CMP14+EGFR
ALT: 13 IU/L (ref 0–32)
AST: 18 IU/L (ref 0–40)
Albumin/Globulin Ratio: 1.7 (ref 1.2–2.2)
Albumin: 4.4 g/dL (ref 3.9–4.9)
Alkaline Phosphatase: 91 IU/L (ref 44–121)
BUN/Creatinine Ratio: 21 (ref 12–28)
BUN: 23 mg/dL (ref 8–27)
Bilirubin Total: 0.3 mg/dL (ref 0.0–1.2)
CO2: 25 mmol/L (ref 20–29)
Calcium: 9.4 mg/dL (ref 8.7–10.3)
Chloride: 98 mmol/L (ref 96–106)
Creatinine, Ser: 1.07 mg/dL — ABNORMAL HIGH (ref 0.57–1.00)
Globulin, Total: 2.6 g/dL (ref 1.5–4.5)
Glucose: 163 mg/dL — ABNORMAL HIGH (ref 70–99)
Potassium: 4.1 mmol/L (ref 3.5–5.2)
Sodium: 138 mmol/L (ref 134–144)
Total Protein: 7 g/dL (ref 6.0–8.5)
eGFR: 58 mL/min/{1.73_m2} — ABNORMAL LOW (ref >59)

## 2021-11-21 LAB — CBC WITH DIFFERENTIAL/PLATELET
Basophils Absolute: 0 10*3/uL (ref 0.0–0.2)
Basos: 0 %
EOS (ABSOLUTE): 0.4 10*3/uL (ref 0.0–0.4)
Eos: 5 %
Hematocrit: 44 % (ref 34.0–46.6)
Hemoglobin: 13.7 g/dL (ref 11.1–15.9)
Immature Grans (Abs): 0 10*3/uL (ref 0.0–0.1)
Immature Granulocytes: 0 %
Lymphocytes Absolute: 2.2 10*3/uL (ref 0.7–3.1)
Lymphs: 23 %
MCH: 26.1 pg — ABNORMAL LOW (ref 26.6–33.0)
MCHC: 31.1 g/dL — ABNORMAL LOW (ref 31.5–35.7)
MCV: 84 fL (ref 79–97)
Monocytes Absolute: 0.5 10*3/uL (ref 0.1–0.9)
Monocytes: 5 %
Neutrophils Absolute: 6.2 10*3/uL (ref 1.4–7.0)
Neutrophils: 67 %
Platelets: 309 10*3/uL (ref 150–450)
RBC: 5.25 x10E6/uL (ref 3.77–5.28)
RDW: 13.3 % (ref 11.7–15.4)
WBC: 9.4 10*3/uL (ref 3.4–10.8)

## 2021-11-21 LAB — LIPID PANEL
Chol/HDL Ratio: 2.5 ratio (ref 0.0–4.4)
Cholesterol, Total: 155 mg/dL (ref 100–199)
HDL: 61 mg/dL (ref >39)
LDL Chol Calc (NIH): 73 mg/dL (ref 0–99)
Triglycerides: 118 mg/dL (ref 0–149)
VLDL Cholesterol Cal: 21 mg/dL (ref 5–40)

## 2021-11-21 LAB — MICROALBUMIN / CREATININE URINE RATIO
Creatinine, Urine: 34.6 mg/dL
Microalb/Creat Ratio: <9 mg/g creat (ref 0–29)
Microalbumin, Urine: <3 ug/mL

## 2021-11-27 NOTE — Progress Notes (Signed)
Hello [name],  Your lab result is normal and/or stable.Some minor variations that are not significant are commonly marked abnormal, but do not represent any medical problem for you.  Best regards, Avantae Bither, M.D.

## 2021-11-28 ENCOUNTER — Telehealth: Payer: Self-pay | Admitting: Family Medicine

## 2021-11-29 NOTE — Telephone Encounter (Signed)
Patient aware.

## 2021-11-29 NOTE — Telephone Encounter (Signed)
Please let patient know tresiba is in fridge for pick up Thanks!

## 2021-11-30 NOTE — Telephone Encounter (Signed)
Mailed applications to pt's home 

## 2021-12-12 ENCOUNTER — Encounter: Payer: Self-pay | Admitting: Family Medicine

## 2021-12-12 ENCOUNTER — Ambulatory Visit (INDEPENDENT_AMBULATORY_CARE_PROVIDER_SITE_OTHER): Payer: Medicare Other | Admitting: Family Medicine

## 2021-12-12 VITALS — BP 107/69 | HR 67 | Temp 96.9°F | Ht 61.0 in | Wt 210.0 lb

## 2021-12-12 DIAGNOSIS — Z0000 Encounter for general adult medical examination without abnormal findings: Secondary | ICD-10-CM

## 2021-12-12 NOTE — Progress Notes (Unsigned)
Annual Wellness Visit     Patient: Sara Stuart, Female    DOB: Jan 04, 1955, 66 y.o.   MRN: [id number]  Subjective  Chief Complaint  Patient presents with   WELCOME TO MEDICARE    Sara Stuart is a 66 y.o. female who presents today for her Annual Wellness Visit. She reports consuming a regular diet. Not avoiding potatoes, starches.  Running around the house for exercise.  She generally feels fairly well. She reports sleeping not good due to hip pain. . She does not have additional problems to discuss today.    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   aspirin EC 81 MG tablet Take 1 tablet (81 mg total) by mouth daily.   baclofen (LIORESAL) 10 MG tablet Take 1 tablet (10 mg total) by mouth 3 (three) times daily as needed.   Continuous Blood Gluc Receiver (FREESTYLE LIBRE 2 READER) DEVI Use to test blood sugar continuously. DX: E11.65   Continuous Blood Gluc Sensor (FREESTYLE LIBRE 2 SENSOR) MISC Use to test blood sugar continuously.  Replace sensor every 14 days. DX: E11.65   dapagliflozin propanediol  (FARXIGA) 10 MG TABS tablet Take 1 tablet (10 mg total) by mouth daily before breakfast.   diltiazem (DILT-XR) 120 MG 24 hr capsule Take 1 capsule (120 mg total) by mouth daily.   fluticasone (FLONASE) 50 MCG/ACT nasal spray Place 2 sprays into both nostrils daily as needed.   gabapentin (NEURONTIN) 300 MG capsule Take 1 capsule (300 mg total) by mouth 2 (two) times daily.   glucose blood test strip Test BS four times daily Dx E11.65   ibuprofen (ADVIL) 800 MG tablet TAKE 1 TABLET BY MOUTH EVERY 8 HOURS AS NEEDED FOR MODERATE PAIN   insulin degludec (TRESIBA FLEXTOUCH) 100 UNIT/ML FlexTouch Pen Inject 26 Units into the skin daily. (Patient taking differently: Inject 20-26 Units into the skin daily.)   insulin isophane & regular human KwikPen (HUMULIN 70/30 KWIKPEN) (70-30) 100 UNIT/ML KwikPen Inject 35 Units into the skin with breakfast, with lunch, and with evening meal. (Patient taking differently: Inject 20-35 Units into the skin with breakfast, with lunch, and with evening meal.)   Insulin Pen Needle (B-D ULTRAFINE III SHORT PEN) 31G X 8 MM MISC 1 each by Does not apply route as directed.   lisinopril (ZESTRIL) 2.5 MG tablet Take 1 tablet (2.5 mg total) by mouth daily.   metFORMIN (GLUCOPHAGE-XR) 500 MG 24 hr tablet Take 2 tablets (1,000 mg total) by mouth 2 (two) times daily.   metoprolol tartrate (LOPRESSOR) 25 MG tablet Take 1 tablet by mouth once daily   nitroGLYCERIN (NITROSTAT) 0.4 MG SL tablet DISSOLVE ONE TABLET UNDER THE  TONGUE EVERY 5 MINUTES AS NEEDED FOR CHEST PAIN.  DO NOT EXCEED A TOTAL OF 3 DOSES IN 15 MINUTES   omeprazole (PRILOSEC) 40 MG capsule Take 1 capsule by mouth once daily   rosuvastatin (CRESTOR) 10 MG tablet Take 1 tablet by mouth once daily   spironolactone (ALDACTONE) 25 MG tablet Take 1/2 (one-half) tablet by mouth once daily   traZODone (DESYREL) 50 MG tablet TAKE 1 TABLET BY MOUTH AT BEDTIME   triamterene-hydrochlorothiazide (DYAZIDE) 37.5-25 MG capsule Take 1 each  (1 capsule total) by mouth daily.   HYDROcodone-acetaminophen (NORCO/VICODIN) 5-325 MG tablet Take 2 tablets by mouth every 12 (twelve) hours as needed for severe pain. (Patient not taking: Reported on 12/12/2021)   metroNIDAZOLE (FLAGYL) 500 MG tablet Take 1 tablet (500 mg total) by mouth 2 (two) times daily. (Patient not taking: Reported on 12/12/2021)   saxagliptin HCl (ONGLYZA) 5 MG TABS tablet Take 1 tablet (5 mg total) by mouth daily.   No facility-administered medications prior to visit.    Allergies  Allergen Reactions   Trulicity [Dulaglutide] Other (See Comments)    GI intolerance    Patient Care Team: Claretta Fraise, MD as PCP - General (Family Medicine) Blanca Friend Royce Macadamia, [facility] as Pharmacist (Family Medicine) Celestia Khat, OD (Optometry)  Review of Systems  Constitutional:  Negative for chills, diaphoresis and fever.  HENT:  Negative for congestion and sore throat.   Eyes: Negative.   Respiratory:  Negative for cough and shortness of breath.   Cardiovascular:  Negative for chest pain and palpitations.  Gastrointestinal:  Negative for abdominal pain, diarrhea, nausea and vomiting.  Genitourinary:  Negative for dysuria.  Musculoskeletal:  Negative for joint pain.  Skin:  Negative for rash.  Neurological:  Negative for dizziness and headaches.  Psychiatric/Behavioral: Negative.          Objective  BP 107/69   Pulse 67   Temp (!) 96.9 F (36.1 C)   Ht _0  (1.549 m)   Wt 210 lb (95.3 kg)   SpO2 97%   BMI 39.68 kg/m  BP Readings from Last 3 Encounters:  12/12/21 107/69  11/20/21 116/79  10/02/21 116/81   Wt Readings from Last 3 Encounters:  12/12/21 210 lb (95.3 kg)  11/20/21 203 lb 12.8 oz (92.4 kg)  10/02/21 200 lb 3.2 oz (90.8 kg)      Physical Exam Constitutional:      General: She is not in acute distress.    Appearance: She is well-developed.  Cardiovascular:     Rate and Rhythm: Normal rate and regular rhythm.  Pulmonary:     Breath sounds:  Normal breath sounds.  Musculoskeletal:        General: Normal range of motion.  Skin:    General: Skin is warm and dry.  Neurological:     Mental Status: She is alert and oriented to person, place, and time.       Most recent functional status assessment:    12/12/2021   11:04 AM  In your present state of health, do you have any difficulty performing the following activities:  Hearing? 1  Comment left ear hearing loss  Vision? 0  Difficulty concentrating or making decisions? 0  Walking or climbing stairs? 1  Comment uses cane for ambulation  Dressing or bathing? 0  Doing errands, shopping? 0  Preparing Food and eating ? N  Using the Toilet? N  In the past six months, have you accidently leaked urine? N  Do you  have problems with loss of bowel control? Y  Managing your Medications? N  Managing your Finances? N  Housekeeping or managing your Housekeeping? N   Most recent fall risk assessment:    12/12/2021   11:03 AM  Fall Risk   Falls in the past year? 0    Most recent depression screenings:    12/12/2021   11:04 AM 11/20/2021   10:59 AM  PHQ 2/9 Scores  PHQ - 2 Score 0 0   Most recent cognitive screening:    12/12/2021   11:06 AM  6CIT Screen  What Year? 0 points  What month? 0 points  What time? 0 points  Count back from 20 0 points  Months in reverse 2 points  Repeat phrase 2 points  Total Score 4 points   Most recent Audit-C alcohol use screening     No data to display         A score of 3 or more in women, and 4 or more in men indicates increased risk for alcohol abuse, EXCEPT if all of the points are from question 1   Vision/Hearing Screen: No results found.  Last CBC Lab Results  Component Value Date   WBC 9.4 11/20/2021   HGB 13.7 11/20/2021   HCT 44.0 11/20/2021   MCV 84 11/20/2021   MCH 26.1 (L) 11/20/2021   RDW 13.3 11/20/2021   PLT 309 50/56/9794   Last metabolic panel Lab Results  Component Value Date   GLUCOSE 163  (H) 11/20/2021   NA 138 11/20/2021   K 4.1 11/20/2021   CL 98 11/20/2021   CO2 25 11/20/2021   BUN 23 11/20/2021   CREATININE 1.07 (H) 11/20/2021   EGFR 58 (L) 11/20/2021   CALCIUM 9.4 11/20/2021   PROT 7.0 11/20/2021   ALBUMIN 4.4 11/20/2021   LABGLOB 2.6 11/20/2021   AGRATIO 1.7 11/20/2021   BILITOT 0.3 11/20/2021   ALKPHOS 91 11/20/2021   AST 18 11/20/2021   ALT 13 11/20/2021   ANIONGAP 11 09/25/2021   Last lipids Lab Results  Component Value Date   CHOL 155 11/20/2021   HDL 61 11/20/2021   LDLCALC 73 11/20/2021   TRIG 118 11/20/2021   CHOLHDL 2.5 11/20/2021   Last hemoglobin A1c Lab Results  Component Value Date   HGBA1C 8.1 (H) 11/20/2021      No results found for any visits on 12/12/21.    Assessment & Plan   Annual wellness visit done today including the all of the following: Reviewed patient's Family Medical History Reviewed and updated list of patient's medical providers Assessment of cognitive impairment was done Assessed patient's functional ability Established a written schedule for health screening [place] Completed and Reviewed  Exercise Activities and Dietary recommendations  Goals       T2DM, HLD, HTN PHARMD GOAL (pt-stated)      Current Barriers:  Unable to independently afford treatment regimen Unable to achieve control of T2DM  Suboptimal therapeutic regimen for T2DM  Pharmacist Clinical Goal(s):  patient will verbalize ability to afford treatment regimen achieve control of T2DM, HLD as evidenced by GOAL A1C, IMPROVED QUALITY OF LIFE, LDL<70 maintain control of T2DM, HLD as evidenced by GOAL A1C, IMPROVED QUALITY OF LIFE, LDL<70  adhere to plan to optimize therapeutic regimen for T2DM, HLD as evidenced by report of adherence to recommended medication management changes through collaboration with PharmD and provider.   Interventions: 1:1 collaboration with Loman Brooklyn, FNP regarding development and update  of  comprehensive plan of care as evidenced by provider attestation and co-signature Inter-disciplinary care team collaboration (see longitudinal plan of care) Comprehensive medication review performed; medication list updated in electronic medical record  Diabetes: Goal on Track (progressing): YES. "Uncontrolled"- A1C 7.0-->8.2%,  A1C increased, patient s/p ortho surgery  practically at goal for patient, however still having HYPERglycemia and HYPOglycemia Patient reports blood sugar is running higher post-rotator cuff surgery Increased pain & diet not as well controlled since she is unable to prepare meals at this time She will work to increase meal time insulin with heavier carb meals to cover the highs Current treatment: Tresiba 25 units AT BEDTIME, Humulin 70/30 mix pen (10-30 units with each meal), Onglyza 53m, Farxiga 140m metformin; Humulin 70/30 mix pen is familiar to her since her husband is also on--we have tried x2 to transition her over to basal/bolus, but this has been unsuccessful.  Patient resorts back to previous regimen if she begins to run high (concern for hypoglycemia/overlap with mix+basal+other meds) Noted intolerance to Trulicity (may consider Rybelsus, low dose ozempic, mounjaro to decrease insulin requirements) Denies personal and family history of Medullary thyroid cancer (MTC) Increase Tresiba to 26 units nightly Continue mix insulin 70/30 to 10-30 units  (increased to 20-28 per meal due to higher Bgs)--patient doing 2-3 times daily ; DO NOT WAIT MORE THE 4 HOURS TO EAT DINNER; USE LIBRE TO GUIDE YOUR TREATMENT AND FOOD DECISIONS) Would like to have patient on rapid acting insulin only (not mix)--this has not worked out x2Games developerbout hypo events-->patient denies hypoglycemia in the past 2 weeks; counseled on administration of insulin and eating 3 steady meals daily (she was waiting to late to eat dinner and going low) Working to get liEnergy East CorporationGM 2 (cost is  $35/month) Continue ongylza (free via az&me patient assistance program) Continue farxiga (free via az&me patient assistance program) Continue metformin Current glucose readings: fasting glucose: <200, post prandial glucose: 200s Reports hypoglycemia has improved/no events x6 weeks The patient is asked to make an attempt to improve diet and exercise patterns to aid in medical management of this problem. Discussed meal planning options and Plate method for healthy eating Avoid sugary drinks and desserts Incorporate balanced protein, non starchy veggies, 1 serving of carbohydrate with each meal Increase water intake Increase physical activity as able Current exercise: n/a-->encouraged Recommended heart healthy diet, healthy plate method handout given; work on transition DM regimen Assessed patient finances. Patient approved for [employer] patient assistance for Farxiga and Onglyza (sub for januvia-easier to get via patient assistance)--patient approved until 12/31/21, Humalog mix insulin 70/30--free via [employer] patient assistance  Hyperlipidemia:  Goal on Track (progressing): YES. Controlled-LDL 78-->67 current treatment: rosuvastatin 1037maily; now at goal;some myopathy with higher dose statin Medications previously tried: n/a  Current dietary patterns: heart healthy diet, healthy plate method handout given Counseled on diet, may increase statin if not at goal with A1c control and lifestyle changes Lipid Panel     Component Value Date/Time   CHOL 146 05/10/2021 0857   TRIG 122 05/10/2021 0857   HDL 57 05/10/2021 0857   CHOLHDL 2.6 05/10/2021 0857   CHOLHDL 3.8 05/02/2017 1617   LDLCALC 67 05/10/2021 0857   LDLCALC 111 (H) 05/02/2017 1617   LABVLDL 22 05/10/2021 0857   Hypertension: Patient at goal <130/80 BP home report-ranges from 110s-130s/65-80 Continue current regimen--on ACEi  Patient Goals/Self-Care Activities patient will:  - take medications as prescribed as evidenced by  patient report and record review check  glucose DAILY OR IF SYMPTOMATIC, document, and provide at future appointments collaborate with provider on medication access solutions target a minimum of 150 minutes of moderate intensity exercise weekly engage in dietary modifications by FOLLOWING HEALTHY PLATE METHOD/HEART HEALTHY DIET         Immunization History  Administered Date(s) Administered   Influenza, High Dose Seasonal PF 10/22/2020   Influenza, Quadrivalent, Recombinant, Inj, Pf 09/18/2018   Influenza,inj,Quad PF,6+ Mos 09/21/2017   Influenza,inj,quad, With Preservative 01/05/2017, 09/21/2017   Influenza,trivalent, recombinat, inj, PF 10/02/2014   Influenza-Unspecified 09/18/2018, 10/02/2019, 10/27/2020   Moderna Sars-Covid-2 Vaccination 11/03/2019, 12/03/2019, 06/16/2020   PNEUMOCOCCAL CONJUGATE-20 11/01/2020   Pneumococcal Polysaccharide-23 09/10/2017   Tdap 01/07/2018   Zoster Recombinat (Shingrix) 02/09/2021    Health Maintenance  Topic Date Due   Zoster Vaccines- Shingrix (2 of 2) 04/06/2021   OPHTHALMOLOGY EXAM  06/23/2021   COVID-19 Vaccine (4 - 2023-24 season) 09/01/2021   FOOT EXAM  11/01/2021   INFLUENZA VACCINE  04/01/2022 (Originally 08/01/2021)   HEMOGLOBIN A1C  05/21/2022   MAMMOGRAM  09/01/2022   Diabetic kidney evaluation - eGFR measurement  11/21/2022   Diabetic kidney evaluation - Urine ACR  11/21/2022   Medicare Annual Wellness (AWV)  12/13/2022   COLONOSCOPY (Pts 45-28yr Insurance coverage will need to be confirmed)  03/25/2024   DTaP/Tdap/Td (2 - Td or Tdap) 01/08/2028   Pneumonia Vaccine 65 Years old  Completed   DEXA SCAN  Completed   Hepatitis C Screening  Completed   HIV Screening  Completed   HPV VACCINES  Aged Out     Discussed health benefits of physical activity, and encouraged her to engage in regular exercise appropriate for her age and condition.    Problem List Items Addressed This Visit   None Visit Diagnoses     Welcome to  Medicare preventive visit    -  Primary       Return in about 1 year (around 12/13/2022).     WClaretta Fraise MD

## 2021-12-12 NOTE — Patient Instructions (Signed)
Advance Directive  Advance directives are legal documents that allow you to make decisions about your health care and medical treatment in case you become unable to communicate for yourself. Advance directives let your wishes be known to family, friends, and health care providers. Discussing and writing advance directives should happen over time rather than all at once. Advance directives can be changed and updated at any time. There are different types of advance directives, such as: Medical power of attorney. Living will. Do not resuscitate (DNR) order or do not attempt resuscitation (DNAR) order. Health care proxy and medical power of attorney A health care proxy is also called a health care agent. This person is appointed to make medical decisions for you when you are unable to make decisions for yourself. Generally, people ask a trusted friend or family member to act as their proxy and represent their preferences. Make sure you have an agreement with your trusted person to act as your proxy. A proxy may have to make a medical decision on your behalf if your wishes are not known. A medical power of attorney, also called a durable power of attorney for health care, is a legal document that names your health care proxy. Depending on the laws in your state, the document may need to be: Signed. Notarized. Dated. Copied. Witnessed. Incorporated into your medical record. You may also want to appoint a trusted person to manage your money in the event you are unable to do so. This is called a durable power of attorney for finances. It is a separate legal document from the durable power of attorney for health care. You may choose your health care proxy or someone different to act as your agent in money matters. If you do not appoint a proxy, or there is a concern that the proxy is not acting in your best interest, a court may appoint a guardian to act on your behalf. Living will A living will is a set  of instructions that state your wishes about medical care when you cannot express them yourself. Health care providers should keep a copy of your living will in your medical record. You may want to give a copy to family members or friends. To alert caregivers in case of an emergency, you can place a card in your wallet to let them know that you have a living will and where they can find it. A living will is used if you become: Terminally ill. Disabled. Unable to communicate or make decisions. The following decisions should be included in your living will: To use or not to use life support equipment, such as dialysis machines and breathing machines (ventilators). Whether you want a DNR or DNAR order. This tells health care providers not to use cardiopulmonary resuscitation (CPR) if breathing or heartbeat stops. To use or not to use tube feeding. To be given or not to be given food and fluids. Whether you want comfort (palliative) care when the goal becomes comfort rather than a cure. Whether you want to donate your organs and tissues. A living will does not give instructions for distributing your money and property if you should pass away. DNR or DNAR A DNR or DNAR order is a request not to have CPR in the event that your heart stops beating or you stop breathing. If a DNR or DNAR order has not been made and shared, a health care provider will try to help any patient whose heart has stopped or who has stopped breathing.   If you plan to have surgery, talk with your health care provider about how your DNR or DNAR order will be followed if problems occur. What if I do not have an advance directive? Some states assign family decision makers to act on your behalf if you do not have an advance directive. Each state has its own laws about advance directives. You may want to check with your health care provider, attorney, or state representative about the laws in your state. Summary Advance directives are  legal documents that allow you to make decisions about your health care and medical treatment in case you become unable to communicate for yourself. The process of discussing and writing advance directives should happen over time. You can change and update advance directives at any time. Advance directives may include a medical power of attorney, a living will, and a DNR or DNAR order. This information is not intended to replace advice given to you by your health care provider. Make sure you discuss any questions you have with your health care provider. Document Revised: 09/22/2019 Document Reviewed: 09/22/2019 Elsevier Patient Education  2023 Elsevier Inc.  

## 2021-12-13 ENCOUNTER — Encounter: Payer: Self-pay | Admitting: Family Medicine

## 2021-12-23 ENCOUNTER — Other Ambulatory Visit: Payer: Self-pay | Admitting: Family Medicine

## 2021-12-23 DIAGNOSIS — E782 Mixed hyperlipidemia: Secondary | ICD-10-CM

## 2021-12-23 DIAGNOSIS — I2510 Atherosclerotic heart disease of native coronary artery without angina pectoris: Secondary | ICD-10-CM

## 2021-12-23 DIAGNOSIS — I10 Essential (primary) hypertension: Secondary | ICD-10-CM

## 2022-01-09 DIAGNOSIS — E1165 Type 2 diabetes mellitus with hyperglycemia: Secondary | ICD-10-CM | POA: Diagnosis not present

## 2022-01-19 ENCOUNTER — Other Ambulatory Visit: Payer: Self-pay | Admitting: Family Medicine

## 2022-01-25 ENCOUNTER — Telehealth: Payer: Self-pay | Admitting: Family Medicine

## 2022-01-25 NOTE — Telephone Encounter (Signed)
Patient called requesting to speak with Sara Stuart regarding patient assistance letter she received in the mail. Has questions.

## 2022-01-26 ENCOUNTER — Other Ambulatory Visit (HOSPITAL_COMMUNITY): Payer: Self-pay

## 2022-01-26 NOTE — Telephone Encounter (Signed)
Called patient.  Pt running low on Tresiba. Re-enrollment was started back in Dec but never rec'd pcp pages back. Will re-submit online. Patient says she is half way thru her last pen of tresiba and cannot afford the $35 copay at the pharmacy. Are samples available?

## 2022-01-29 ENCOUNTER — Other Ambulatory Visit (HOSPITAL_COMMUNITY): Payer: Self-pay

## 2022-01-30 NOTE — Telephone Encounter (Signed)
Please let patient know tresiba samples are up front in fridge  Thanks!

## 2022-01-30 NOTE — Telephone Encounter (Signed)
Patient aware

## 2022-02-02 ENCOUNTER — Telehealth: Payer: Self-pay | Admitting: Family Medicine

## 2022-02-02 NOTE — Telephone Encounter (Signed)
Patient calling to see if we have any samples of Farxiga.

## 2022-02-02 NOTE — Telephone Encounter (Signed)
Samples have been placed upfront

## 2022-02-15 ENCOUNTER — Other Ambulatory Visit: Payer: Self-pay | Admitting: Family Medicine

## 2022-02-15 DIAGNOSIS — I10 Essential (primary) hypertension: Secondary | ICD-10-CM

## 2022-02-20 ENCOUNTER — Encounter: Payer: Self-pay | Admitting: Family Medicine

## 2022-02-20 ENCOUNTER — Ambulatory Visit (INDEPENDENT_AMBULATORY_CARE_PROVIDER_SITE_OTHER): Payer: Medicare Other | Admitting: Family Medicine

## 2022-02-20 VITALS — BP 108/73 | HR 61 | Temp 97.2°F | Ht 61.0 in | Wt 210.6 lb

## 2022-02-20 DIAGNOSIS — E1165 Type 2 diabetes mellitus with hyperglycemia: Secondary | ICD-10-CM | POA: Diagnosis not present

## 2022-02-20 DIAGNOSIS — E782 Mixed hyperlipidemia: Secondary | ICD-10-CM

## 2022-02-20 DIAGNOSIS — Z794 Long term (current) use of insulin: Secondary | ICD-10-CM

## 2022-02-20 DIAGNOSIS — M79601 Pain in right arm: Secondary | ICD-10-CM

## 2022-02-20 DIAGNOSIS — I10 Essential (primary) hypertension: Secondary | ICD-10-CM | POA: Diagnosis not present

## 2022-02-20 LAB — CBC WITH DIFFERENTIAL/PLATELET
Basophils Absolute: 0.1 10*3/uL (ref 0.0–0.2)
Basos: 1 %
EOS (ABSOLUTE): 0.4 10*3/uL (ref 0.0–0.4)
Eos: 5 %
Hematocrit: 38.4 % (ref 34.0–46.6)
Hemoglobin: 12.5 g/dL (ref 11.1–15.9)
Immature Grans (Abs): 0 10*3/uL (ref 0.0–0.1)
Immature Granulocytes: 0 %
Lymphocytes Absolute: 2 10*3/uL (ref 0.7–3.1)
Lymphs: 22 %
MCH: 26.7 pg (ref 26.6–33.0)
MCHC: 32.6 g/dL (ref 31.5–35.7)
MCV: 82 fL (ref 79–97)
Monocytes Absolute: 0.5 10*3/uL (ref 0.1–0.9)
Monocytes: 6 %
Neutrophils Absolute: 5.8 10*3/uL (ref 1.4–7.0)
Neutrophils: 66 %
Platelets: 287 10*3/uL (ref 150–450)
RBC: 4.69 x10E6/uL (ref 3.77–5.28)
RDW: 14 % (ref 11.7–15.4)
WBC: 8.8 10*3/uL (ref 3.4–10.8)

## 2022-02-20 LAB — CMP14+EGFR
ALT: 19 IU/L (ref 0–32)
AST: 19 IU/L (ref 0–40)
Albumin/Globulin Ratio: 1.9 (ref 1.2–2.2)
Albumin: 4.4 g/dL (ref 3.9–4.9)
Alkaline Phosphatase: 80 IU/L (ref 44–121)
BUN/Creatinine Ratio: 22 (ref 12–28)
BUN: 24 mg/dL (ref 8–27)
Bilirubin Total: 0.3 mg/dL (ref 0.0–1.2)
CO2: 21 mmol/L (ref 20–29)
Calcium: 9 mg/dL (ref 8.7–10.3)
Chloride: 99 mmol/L (ref 96–106)
Creatinine, Ser: 1.07 mg/dL — ABNORMAL HIGH (ref 0.57–1.00)
Globulin, Total: 2.3 g/dL (ref 1.5–4.5)
Glucose: 186 mg/dL — ABNORMAL HIGH (ref 70–99)
Potassium: 4.2 mmol/L (ref 3.5–5.2)
Sodium: 135 mmol/L (ref 134–144)
Total Protein: 6.7 g/dL (ref 6.0–8.5)
eGFR: 57 mL/min/{1.73_m2} — ABNORMAL LOW (ref >59)

## 2022-02-20 LAB — BAYER DCA HB A1C WAIVED: HB A1C (BAYER DCA - WAIVED): 8.6 % — ABNORMAL HIGH (ref 4.8–5.6)

## 2022-02-20 LAB — LIPID PANEL
Chol/HDL Ratio: 2.2 ratio (ref 0.0–4.4)
Cholesterol, Total: 127 mg/dL (ref 100–199)
HDL: 58 mg/dL (ref >39)
LDL Chol Calc (NIH): 54 mg/dL (ref 0–99)
Triglycerides: 73 mg/dL (ref 0–149)
VLDL Cholesterol Cal: 15 mg/dL (ref 5–40)

## 2022-02-20 MED ORDER — BACLOFEN 10 MG PO TABS: 10.0000 mg | ORAL_TABLET | Freq: Three times a day (TID) | ORAL | 2 refills | Status: DC | PRN

## 2022-02-20 NOTE — Progress Notes (Addendum)
Subjective:  Patient ID: Sara Stuart, female    DOB: 07-19-1955  Age: 67 y.o. MRN: [id number]  CC: Medical Management of Chronic Issues   HPI Sara Stuart presents forFollow-up of diabetes. Patient checks blood sugar at home. She uses a CGM currently.   150-200 fasting and similar postprandial.Patient denies symptoms such as polyuria, polydipsia, excessive hunger, nausea No significant hypoglycemic spells noted. Medications reviewed. Pt reports taking them regularly without complication/adverse reaction being reported today.  Lab Results  Component Value Date   HGBA1C 8.6 (H) 02/20/2022   HGBA1C 8.1 (H) 11/20/2021   HGBA1C 8.2 (H) 08/11/2021     History Sara Stuart has a past medical history of Arthritis, Chronic back pain, DDD (degenerative disc disease), lumbosacral, Diabetes mellitus without complication ([place]) (diagnosed in 02/2014), GERD (gastroesophageal reflux disease), Gout, Hypertension, Nocturia, Polio, PONV (postoperative nausea and vomiting), and Wears glasses.   She has a past surgical history that includes Foot surgery; surgery for polio; bladder tacked ; Back surgery (07/2012); Tubal ligation; Colonoscopy; Total abdominal hysterectomy (2002); and Shoulder arthroscopy with rotator cuff repair (Right, 06/15/2021).   Her family history includes Cancer in her sister; Colon cancer in her mother; Diabetes in her father; Heart attack in her father; Thyroid disease in her father.She reports that she has never smoked. She has never used smokeless tobacco. She reports that she does not drink alcohol and does not use drugs.  Current Outpatient Medications on File Prior to Visit  Medication Sig Dispense Refill   acetaminophen (TYLENOL) 500 MG tablet Take 1 tablet (500 mg total) by mouth every 6 (six) hours as needed for moderate pain. 30 tablet 0   aspirin EC 81 MG tablet Take 1 tablet (81 mg total) by mouth daily. 30 tablet    Continuous Blood Gluc Receiver (FREESTYLE LIBRE 2 READER) DEVI  Use to test blood sugar continuously. DX: E11.65 1 each 0   Continuous Blood Gluc Sensor (FREESTYLE LIBRE 2 SENSOR) MISC Use to test blood sugar continuously.  Replace sensor every 14 days. DX: E11.65 2 each 11   dapagliflozin propanediol (FARXIGA) 10 MG TABS tablet Take 1 tablet (10 mg total) by mouth daily before breakfast. 90 tablet 4   diltiazem (DILT-XR) 120 MG 24 hr capsule Take 1 capsule (120 mg total) by mouth daily. 90 capsule 1   fluticasone (FLONASE) 50 MCG/ACT nasal spray Place 2 sprays into both nostrils daily as needed.     gabapentin (NEURONTIN) 300 MG capsule Take 1 capsule (300 mg total) by mouth 2 (two) times daily. 180 capsule 1   glucose blood test strip Test BS four times daily Dx E11.65 400 each 3   HYDROcodone-acetaminophen (NORCO/VICODIN) 5-325 MG tablet Take 2 tablets by mouth every 12 (twelve) hours as needed for severe pain. 6 tablet 0   ibuprofen (ADVIL) 800 MG tablet TAKE 1 TABLET BY MOUTH EVERY 8 HOURS AS NEEDED FOR MODERATE PAIN 90 tablet 0   insulin degludec (TRESIBA FLEXTOUCH) 100 UNIT/ML FlexTouch Pen Inject 26 Units into the skin daily. (Patient taking differently: Inject 20-26 Units into the skin daily.) 15 mL 3   insulin isophane & regular human KwikPen (HUMULIN 70/30 KWIKPEN) (70-30) 100 UNIT/ML KwikPen Inject 35 Units into the skin with breakfast, with lunch, and with evening meal. (Patient taking differently: Inject 20-35 Units into the skin with breakfast, with lunch, and with evening meal.) 30 mL 11   Insulin Pen Needle (B-D ULTRAFINE III SHORT PEN) 31G X 8 MM MISC 1 each by Does not apply  route as directed. 100 each 3   lisinopril (ZESTRIL) 2.5 MG tablet Take 1 tablet by mouth once daily 90 tablet 0   metFORMIN (GLUCOPHAGE-XR) 500 MG 24 hr tablet Take 2 tablets (1,000 mg total) by mouth 2 (two) times daily. 360 tablet 1   metoprolol tartrate (LOPRESSOR) 25 MG tablet Take 1 tablet by mouth once daily 90 tablet 0   metroNIDAZOLE (FLAGYL) 500 MG tablet Take 1  tablet (500 mg total) by mouth 2 (two) times daily. 14 tablet 0   nitroGLYCERIN (NITROSTAT) 0.4 MG SL tablet DISSOLVE ONE TABLET UNDER THE TONGUE EVERY 5 MINUTES AS NEEDED FOR CHEST PAIN.  DO NOT EXCEED A TOTAL OF 3 DOSES IN 15 MINUTES 25 tablet 3   omeprazole (PRILOSEC) 40 MG capsule Take 1 capsule by mouth once daily 90 capsule 1   rosuvastatin (CRESTOR) 10 MG tablet Take 1 tablet by mouth once daily 90 tablet 0   saxagliptin HCl (ONGLYZA) 5 MG TABS tablet Take 1 tablet (5 mg total) by mouth daily. 90 tablet 4   spironolactone (ALDACTONE) 25 MG tablet Take 1/2 (one-half) tablet by mouth once daily 45 tablet 0   traZODone (DESYREL) 50 MG tablet TAKE 1 TABLET BY MOUTH AT BEDTIME 90 tablet 1   triamterene-hydrochlorothiazide (DYAZIDE) 37.5-25 MG capsule Take 1 each (1 capsule total) by mouth daily. 90 capsule 1   No current facility-administered medications on file prior to visit.    ROS Review of Systems  Constitutional: Negative.   HENT: Negative.    Eyes:  Negative for visual disturbance.  Respiratory:  Negative for shortness of breath.   Cardiovascular:  Negative for chest pain.  Gastrointestinal:  Negative for abdominal pain.  Musculoskeletal:  Negative for arthralgias.    Objective:  BP 108/73   Pulse 61   Temp (!) 97.2 F (36.2 C)   Ht 5\' 1"  (1.549 m)   Wt 210 lb 9.6 oz (95.5 kg)   SpO2 98%   BMI 39.79 kg/m   BP Readings from Last 3 Encounters:  02/20/22 108/73  12/12/21 107/69  11/20/21 116/79    Wt Readings from Last 3 Encounters:  02/20/22 210 lb 9.6 oz (95.5 kg)  12/12/21 210 lb (95.3 kg)  11/20/21 203 lb 12.8 oz (92.4 kg)     Physical Exam Constitutional:      General: She is not in acute distress.    Appearance: She is well-developed.  Cardiovascular:     Rate and Rhythm: Normal rate and regular rhythm.  Pulmonary:     Breath sounds: Normal breath sounds.  Musculoskeletal:        General: Normal range of motion.  Skin:    General: Skin is warm and  dry.  Neurological:     Mental Status: She is alert and oriented to person, place, and time.       Assessment & Plan:   Sara Stuart was seen today for medical management of chronic issues.  Diagnoses and all orders for this visit:  Uncontrolled type 2 diabetes mellitus with hyperglycemia, with long-term current use of insulin ([place]) -     Bayer DCA Hb A1c Waived  Mixed hyperlipidemia -     Lipid panel  Essential hypertension, benign -     CBC with Differential/Platelet -     CMP14+EGFR  Right arm pain -     baclofen (LIORESAL) 10 MG tablet; Take 1 tablet (10 mg total) by mouth 3 (three) times daily as needed.      I  am having Sara Stuart "Fredricka Bonine" maintain her aspirin EC, Insulin Pen Needle, fluticasone, glucose blood, Tresiba FlexTouch, HumuLIN 70/30 KwikPen, FreeStyle Libre 2 Sensor, [employer] 2 Reader, dapagliflozin propanediol, saxagliptin HCl, nitroGLYCERIN, diltiazem, metFORMIN, triamterene-hydrochlorothiazide, acetaminophen, HYDROcodone-acetaminophen, metroNIDAZOLE, traZODone, gabapentin, rosuvastatin, lisinopril, omeprazole, ibuprofen, metoprolol tartrate, spironolactone, and baclofen.  Meds ordered this encounter  Medications   baclofen (LIORESAL) 10 MG tablet    Sig: Take 1 tablet (10 mg total) by mouth 3 (three) times daily as needed.    Dispense:  90 each    Refill:  2     Follow-up: Return in about 3 months (around 05/21/2022).  Claretta Fraise, M.D. 02/20/22

## 2022-02-21 NOTE — Progress Notes (Signed)
Hello Kanyia,  Your lab result is normal and/or stable.Some minor variations that are not significant are commonly marked abnormal, but do not represent any medical problem for you.  Best regards, Claretta Fraise, M.D.

## 2022-02-23 NOTE — Telephone Encounter (Signed)
Pt called requesting to speak with Rosendo Gros regarding her patient assistance medications.

## 2022-02-24 ENCOUNTER — Encounter: Payer: Self-pay | Admitting: Family Medicine

## 2022-02-26 ENCOUNTER — Telehealth: Payer: Self-pay

## 2022-02-26 ENCOUNTER — Other Ambulatory Visit: Payer: Self-pay | Admitting: Family Medicine

## 2022-02-26 ENCOUNTER — Other Ambulatory Visit (HOSPITAL_COMMUNITY): Payer: Self-pay

## 2022-02-26 DIAGNOSIS — Z1231 Encounter for screening mammogram for malignant neoplasm of breast: Secondary | ICD-10-CM

## 2022-02-26 NOTE — Telephone Encounter (Signed)
Pt says she is almost out of samples of farxiga and was told the office didn't have any left. (Had medication name confused with Tyler Aas- which she is also enrolled for).  Patient is enrolled with [employer] for this medication. Provided company phone number for patient to call and follow up on her shipment. Pt will return call to me or office if she has any issues.

## 2022-02-26 NOTE — Telephone Encounter (Signed)
Patient spoke with company, meds are being shipped. No new rx needed I suppose!!

## 2022-02-26 NOTE — Telephone Encounter (Signed)
Received notification from [facility] regarding approval for [employer]. Patient assistance approved until 01/01/23.  Medication will ship to office  Phone: [phone]

## 2022-02-26 NOTE — Telephone Encounter (Signed)
Pt called this office to let you know that she did get in touch with the pharmacy  and they will be sending her meds

## 2022-02-26 NOTE — Telephone Encounter (Signed)
[id number] for Onglyza and Wilder Glade may need to be sent to [facility] for shipment.

## 2022-02-27 ENCOUNTER — Encounter: Payer: Self-pay | Admitting: Pharmacist

## 2022-03-01 ENCOUNTER — Encounter: Payer: Self-pay | Admitting: Radiology

## 2022-03-03 ENCOUNTER — Other Ambulatory Visit: Payer: Self-pay | Admitting: Family Medicine

## 2022-03-03 DIAGNOSIS — I10 Essential (primary) hypertension: Secondary | ICD-10-CM

## 2022-03-05 ENCOUNTER — Ambulatory Visit
Admission: RE | Admit: 2022-03-05 | Discharge: 2022-03-05 | Disposition: A | Discharge status: Home / Self Care | Payer: Medicare Other | Source: Ambulatory Visit

## 2022-03-05 DIAGNOSIS — Z1231 Encounter for screening mammogram for malignant neoplasm of breast: Secondary | ICD-10-CM | POA: Diagnosis not present

## 2022-03-07 ENCOUNTER — Encounter: Payer: Self-pay | Admitting: Family Medicine

## 2022-03-08 ENCOUNTER — Encounter: Payer: Self-pay | Admitting: Nurse Practitioner

## 2022-03-08 ENCOUNTER — Ambulatory Visit (INDEPENDENT_AMBULATORY_CARE_PROVIDER_SITE_OTHER): Payer: Medicare Other | Admitting: Nurse Practitioner

## 2022-03-08 VITALS — BP 98/69 | HR 61 | Temp 97.2°F | Resp 20 | Ht 61.0 in | Wt 205.0 lb

## 2022-03-08 DIAGNOSIS — K1120 Sialoadenitis, unspecified: Secondary | ICD-10-CM | POA: Diagnosis not present

## 2022-03-08 DIAGNOSIS — R112 Nausea with vomiting, unspecified: Secondary | ICD-10-CM | POA: Diagnosis not present

## 2022-03-08 MED ORDER — ONDANSETRON HCL 4 MG PO TABS: 4.0000 mg | ORAL_TABLET | Freq: Three times a day (TID) | ORAL | 0 refills | Status: AC | PRN

## 2022-03-08 NOTE — Progress Notes (Addendum)
Subjective:    Patient ID: Sara Stuart, female    DOB: 01/09/1955, 67 y.o.   MRN: [id number]   Chief Complaint: Knot on left side of face and Nausea   HPI Patient comes in with 2 complaints: - knot on left side of face- has been there for a few days. Has gone down some since yesterday.  -nausea- has had for a couple of weeks. She has thrown up a couple of time sin the last 2 weeks. Smells bother her.  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  Gastrointestinal:  Positive for nausea and vomiting. Negative for abdominal pain, constipation and diarrhea.       Objective:   Physical Exam Vitals reviewed.  Constitutional:      Appearance: Normal appearance. She is obese.  HENT:     Head:     Salivary Glands: Left salivary gland is diffusely enlarged and tender.  Cardiovascular:     Rate and Rhythm: Normal rate and regular rhythm.     Heart sounds: Normal heart sounds.  Pulmonary:     Effort: Pulmonary effort is normal.     Breath sounds: Normal breath sounds.  Abdominal:     General: Abdomen is flat.     Palpations: Abdomen is soft.     Tenderness: There is no abdominal tenderness.  Skin:    General: Skin is warm.  Neurological:     General: No focal  deficit present.     Mental Status: She is alert and oriented to person, place, and time.  Psychiatric:        Mood and Affect: Mood normal.        Behavior: Behavior normal.     BP 98/69   Pulse 61   Temp (!) 97.2 F (36.2 C) (Temporal)   Resp 20   Ht '5\' 1"'$  (1.549 m)   Wt 205 lb (93 kg)   SpO2 95%   BMI 38.73 kg/m        Assessment & Plan:   Sara Stuart in today with chief complaint of Knot on left side of face and Nausea   1. Nausea and vomiting, unspecified vomiting type First 24 Hours-Clear liquids  popsicles  Jello  gatorade  Sprite Second 24 hours-Add Full liquids ( Liquids you cant see through) Third 24 hours- Bland diet ( foods that are baked or broiled)  *avoiding fried foods and highly spiced foods* During these 3 days  Avoid milk, cheese, ice cream or any other dairy products  Avoid caffeine- REMEMBER Mt. Dew and Mello Yellow contain lots of caffeine You should eat and drink in  Frequent small volumes If no improvement in symptoms or worsen in 2-3 days should RETRUN TO OFFICE or go to ER!   Meds ordered this encounter  Medications   ondansetron (ZOFRAN) 4 MG tablet  Sig: Take 1 tablet (4 mg total) by mouth every 8 (eight) hours as needed for nausea or vomiting.    Dispense:  20 tablet    Refill:  0    Order Specific Question:   Supervising Provider    Answer:   Caryl Pina A [id number]      2. Parotiditis Usually viral Suck on sugar free sour candy Motrin if helps  Patient is diabetic with high HGBA1c- cannot treat with steroids at this time.  The above assessment and management plan was discussed with the patient. The patient verbalized understanding of and has agreed to the management plan. Patient is aware to call the clinic if symptoms persist or worsen. Patient is aware when to return to the clinic for a follow-up visit. Patient educated on when it is appropriate to go to the emergency department.   Mary-Margaret Hassell Done, FNP

## 2022-03-08 NOTE — Patient Instructions (Signed)
Parotitis ? ?Parotitis means that you have irritation and swelling (inflammation) in one or both of your parotid glands. These glands make saliva. They are found on each side of your face, below and in front of your earlobes. You may or may not have pain with this condition. ?What are the causes? ?This condition may be caused by: ?Infections from germs (bacteria or viruses). ?Something blocking the flow of saliva through the parotid glands. This can be a stone, scar tissue, or a tumor. ?Diseases that cause your body's defense system (immune system) to attack healthy cells in your salivary glands. These are called autoimmune diseases. ?What increases the risk? ?Being 50 years old or older. ?Not drinking enough fluids (being dehydrated). ?Drinking too much alcohol. ?Having: ?A dry mouth. ?Diabetes. ?Gout. ?A long-term illness. ?Not taking good care of your mouth and teeth (poor oral hygiene). ?Having had radiation treatments to the head and neck. ?Taking certain medicines. ?What are the signs or symptoms? ?Swelling under and in front of the ear. This may get worse after you eat. ?Pain and tenderness over the parotid gland. This may get worse after you eat. ?Redness and warmth of the skin over the parotid gland. ?Fever or chills. ?Pus coming from the ducts inside the mouth. ?Dry mouth. ?A bad taste in the mouth. ?How is this treated? ?Treatment depends on the cause. It may include: ?Antibiotic medicine for an infection from bacteria. ?NSAIDs, such as ibuprofen, to treat pain and swelling. ?Drinking more fluids. ?Removing a stone or obstruction. ?Treating a disease that is causing parotitis. ?Surgery to drain an infection, remove a growth, or remove the whole gland. ?Treatment may not be needed if the swelling goes away with home care. ?Follow these instructions at home: ?Medicines ? ?Take over-the-counter and prescription medicines only as told by your doctor. ?If you were prescribed an antibiotic medicine, take it as  told by your doctor. Do not stop taking it even if you start to feel better. ?Managing pain and swelling ?If told, put heat on the affected area. Do this as often as told by your doctor. Use the heat source that your doctor recommends, such as a moist heat pack or a heating pad. ?Place a towel between your skin and the heat source. ?Leave the heat on for 20-30 minutes. ?Take off the heat if your skin turns bright red. This is very important. If you cannot feel pain, heat, or cold, you have a greater risk of getting burned. ?Gargle with salt water 3-4 times a day or as needed. To make salt water, dissolve ?-1 tsp (3-6 g) of salt in 1 cup (237 mL) of warm water. ?Gently rub your parotid glands as told by your doctor. ?General instructions ? ?Drink enough fluid to keep your pee (urine) pale yellow. ?Keep your mouth clean and moist. ?Suck on sour candy. This may help to: ?Make your mouth less dry. ?Make more saliva. ?Take good care of your mouth: ?Brush your teeth at least two times a day. ?Floss your teeth every day. ?See your dentist regularly. ?Do not smoke or use any products that contain nicotine or tobacco. If you need help quitting, ask your doctor. ?Do not drink alcohol. ?Keep all follow-up visits. ?Contact a doctor if: ?You have a fever or chills. ?You have new symptoms. ?Your symptoms get worse. ?Your symptoms do not get better with treatment. ?Get help right away if: ?You have trouble breathing or swallowing. ?These symptoms may be an emergency. Get help right away. Call your   local emergency services (911 in the [place] ?Do not wait to see if the symptoms will go away. ?Do not drive yourself to the hospital. ?Summary ?Parotitis means that you have irritation and swelling (inflammation) in one or both of your parotid glands. ?Symptoms include pain and swelling under and in front of the ear. ?Treatment for parotitis depends on the cause. In some cases, the condition may go away on its own with home care. ?You  should drink plenty of fluids, take good care of your mouth, and do not use products that contain nicotine or tobacco. ?This information is not intended to replace advice given to you by your health care provider. Make sure you discuss any questions you have with your health care provider. ?Document Revised: 04/29/2020 Document Reviewed: 04/29/2020 ?Elsevier Patient Education ? 2023 Elsevier Inc. ? ?

## 2022-03-19 DIAGNOSIS — E1165 Type 2 diabetes mellitus with hyperglycemia: Secondary | ICD-10-CM | POA: Diagnosis not present

## 2022-03-20 ENCOUNTER — Other Ambulatory Visit: Payer: Self-pay | Admitting: Family Medicine

## 2022-03-20 DIAGNOSIS — E782 Mixed hyperlipidemia: Secondary | ICD-10-CM

## 2022-03-20 DIAGNOSIS — I2510 Atherosclerotic heart disease of native coronary artery without angina pectoris: Secondary | ICD-10-CM

## 2022-03-20 DIAGNOSIS — Z794 Long term (current) use of insulin: Secondary | ICD-10-CM

## 2022-03-20 DIAGNOSIS — I10 Essential (primary) hypertension: Secondary | ICD-10-CM

## 2022-03-20 DIAGNOSIS — E1165 Type 2 diabetes mellitus with hyperglycemia: Secondary | ICD-10-CM

## 2022-03-22 ENCOUNTER — Encounter: Payer: Self-pay | Admitting: Family Medicine

## 2022-03-22 ENCOUNTER — Ambulatory Visit (INDEPENDENT_AMBULATORY_CARE_PROVIDER_SITE_OTHER): Payer: Medicare Other | Admitting: Family Medicine

## 2022-03-22 DIAGNOSIS — Z794 Long term (current) use of insulin: Secondary | ICD-10-CM | POA: Diagnosis not present

## 2022-03-22 DIAGNOSIS — E1165 Type 2 diabetes mellitus with hyperglycemia: Secondary | ICD-10-CM

## 2022-03-22 MED ORDER — TRESIBA FLEXTOUCH 100 UNIT/ML ~~LOC~~ SOPN: 30.0000 [IU] | PEN_INJECTOR | Freq: Every day | SUBCUTANEOUS | 3 refills | Status: DC

## 2022-03-22 MED ORDER — HUMULIN 70/30 KWIKPEN (70-30) 100 UNIT/ML ~~LOC~~ SUPN: PEN_INJECTOR | SUBCUTANEOUS | 11 refills | Status: DC

## 2022-03-22 MED ORDER — TIZANIDINE HCL 4 MG PO TABS: 4.0000 mg | ORAL_TABLET | Freq: Four times a day (QID) | ORAL | 1 refills | Status: DC | PRN

## 2022-03-22 NOTE — Progress Notes (Signed)
Subjective:  Patient ID: Sara Stuart, female    DOB: 08-Feb-1955  Age: 67 y.o. MRN: [id number]  CC: Diabetes   HPI Sara Stuart presents forFollow-up of diabetes. Patient checks blood sugar at home.   Log attached Patient denies symptoms such as polyuria, polydipsia, excessive hunger, nausea No significant hypoglycemic spells noted. Medications reviewed. Pt reports taking them regularly without complication/adverse reaction being reported today.    History Sara Stuart has a past medical history of Arthritis, Chronic back pain, DDD (degenerative disc disease), lumbosacral, Diabetes mellitus without complication ([place]) (diagnosed in 02/2014), GERD (gastroesophageal reflux disease), Gout, Hypertension, Nocturia, Polio, PONV (postoperative nausea and vomiting), and Wears glasses.   She has a past surgical history that includes Foot surgery; surgery for polio; bladder tacked ; Back surgery (07/2012); Tubal ligation; Colonoscopy; Total abdominal hysterectomy (2002); and Shoulder arthroscopy with rotator cuff repair (Right, 06/15/2021).   Her family history includes Cancer in her sister; Colon cancer in her mother; Diabetes in her father; Heart attack in her father; Thyroid disease in her father.She reports that she has never smoked. She has never used smokeless tobacco. She reports that she does not drink alcohol and does not use drugs.  Current Outpatient Medications on File Prior to Visit  Medication Sig Dispense Refill   acetaminophen (TYLENOL) 500 MG tablet Take 1 tablet (500 mg total) by mouth every 6 (six) hours as needed for moderate pain. 30 tablet 0   aspirin EC 81 MG tablet Take 1 tablet (81 mg total) by mouth daily. 30 tablet    Continuous Blood Gluc Receiver (FREESTYLE LIBRE 2 READER) DEVI Use to test blood sugar continuously. DX: E11.65 1 each 0   Continuous Blood Gluc Sensor (FREESTYLE LIBRE 2 SENSOR) MISC Use to test blood sugar continuously.  Replace sensor every 14 days. DX: E11.65 2 each  11   dapagliflozin propanediol (FARXIGA) 10 MG TABS tablet Take 1 tablet (10 mg total) by mouth daily before breakfast. 90 tablet 4   DILT-XR 120 MG 24 hr capsule Take 1 capsule by mouth once daily 90 capsule 0   fluticasone (FLONASE) 50 MCG/ACT nasal spray Place 2 sprays into both nostrils daily as needed.     gabapentin (NEURONTIN) 300 MG capsule Take 1 capsule (300 mg total) by mouth 2 (two) times daily. 180 capsule 1   glucose blood test strip Test BS four times daily Dx E11.65 400 each 3   HYDROcodone-acetaminophen (NORCO/VICODIN) 5-325 MG tablet Take 2 tablets by mouth every 12 (twelve) hours as needed for severe pain. 6 tablet 0   ibuprofen (ADVIL) 800 MG tablet TAKE 1 TABLET BY MOUTH EVERY 8 HOURS AS NEEDED FOR MODERATE PAIN 90 tablet 0   Insulin Pen Needle (B-D ULTRAFINE III SHORT PEN) 31G X 8 MM MISC 1 each by Does not apply route as directed. 100 each 3   lisinopril (ZESTRIL) 2.5 MG tablet Take 1 tablet by mouth once daily 90 tablet 0   metFORMIN (GLUCOPHAGE-XR) 500 MG 24 hr tablet Take 2 tablets by mouth twice daily 60 tablet 0   metoprolol tartrate (LOPRESSOR) 25 MG tablet Take 1 tablet by mouth once daily 90 tablet 0   metroNIDAZOLE (FLAGYL) 500 MG tablet Take 1 tablet (500 mg total) by mouth 2 (two) times daily. 14 tablet 0   nitroGLYCERIN (NITROSTAT) 0.4 MG SL tablet DISSOLVE ONE TABLET UNDER THE TONGUE EVERY 5 MINUTES AS NEEDED FOR CHEST PAIN.  DO NOT EXCEED A TOTAL OF 3 DOSES IN 15 MINUTES 25 tablet 3  omeprazole (PRILOSEC) 40 MG capsule Take 1 capsule by mouth once daily 90 capsule 1   ondansetron (ZOFRAN) 4 MG tablet Take 1 tablet (4 mg total) by mouth every 8 (eight) hours as needed for nausea or vomiting. 20 tablet 0   rosuvastatin (CRESTOR) 10 MG tablet Take 1 tablet by mouth once daily 90 tablet 0   spironolactone (ALDACTONE) 25 MG tablet Take 1/2 (one-half) tablet by mouth once daily 45 tablet 0   traZODone (DESYREL) 50 MG tablet TAKE 1 TABLET BY MOUTH AT BEDTIME 90 tablet  1   triamterene-hydrochlorothiazide (DYAZIDE) 37.5-25 MG capsule Take 1 capsule by mouth once daily 30 capsule 0   No current facility-administered medications on file prior to visit.    ROS Review of Systems  Constitutional: Negative.   HENT: Negative.    Eyes:  Negative for visual disturbance.  Respiratory:  Negative for shortness of breath.   Cardiovascular:  Negative for chest pain.  Gastrointestinal:  Negative for abdominal pain.  Musculoskeletal:  Negative for arthralgias.    Objective:  BP 108/66   Pulse 67   Temp 97.7 F (36.5 C)   Ht 5\' 1"  (1.549 m)   Wt 207 lb 6.4 oz (94.1 kg)   SpO2 95%   BMI 39.19 kg/m   BP Readings from Last 3 Encounters:  03/22/22 108/66  03/08/22 98/69  02/20/22 108/73    Wt Readings from Last 3 Encounters:  03/22/22 207 lb 6.4 oz (94.1 kg)  03/08/22 205 lb (93 kg)  02/20/22 210 lb 9.6 oz (95.5 kg)     Physical Exam Constitutional:      General: She is not in acute distress.    Appearance: She is well-developed.  Cardiovascular:     Rate and Rhythm: Normal rate and regular rhythm.  Pulmonary:     Breath sounds: Normal breath sounds.  Musculoskeletal:        General: Normal range of motion.  Skin:    General: Skin is warm and dry.  Neurological:     Mental Status: She is alert and oriented to person, place, and time.       Assessment & Plan:   Sara Stuart was seen today for diabetes.  Diagnoses and all orders for this visit:  Uncontrolled type 2 diabetes mellitus with hyperglycemia, with long-term current use of insulin (HCC) -     insulin isophane & regular human KwikPen (HUMULIN 70/30 KWIKPEN) (70-30) 100 UNIT/ML KwikPen; Inject 30 Units into the skin with breakfast, and 20 with evening meal. -     insulin degludec (TRESIBA FLEXTOUCH) 100 UNIT/ML FlexTouch Pen; Inject 30 Units into the skin at bedtime.  Other orders -     tiZANidine (ZANAFLEX) 4 MG tablet; Take 1 tablet (4 mg total) by mouth every 6 (six) hours as  needed for muscle spasms.      I have discontinued Sara Stuart "[name]"'s saxagliptin HCl and baclofen. I have also changed her HumuLIN 70/30 KwikPen and [employer]. Additionally, I am having her start on tiZANidine. Lastly, I am having her maintain her aspirin EC, Insulin Pen Needle, fluticasone, glucose blood, FreeStyle Libre 2 Sensor, [employer] 2 Reader, dapagliflozin propanediol, nitroGLYCERIN, acetaminophen, HYDROcodone-acetaminophen, metroNIDAZOLE, traZODone, gabapentin, lisinopril, omeprazole, ibuprofen, metoprolol tartrate, spironolactone, Dilt-XR, ondansetron, triamterene-hydrochlorothiazide, metFORMIN, and rosuvastatin.  Meds ordered this encounter  Medications   insulin isophane & regular human KwikPen (HUMULIN 70/30 KWIKPEN) (70-30) 100 UNIT/ML KwikPen    Sig: Inject 30 Units into the skin with breakfast, and 20 with evening meal.  Dispense:  30 mL    Refill:  11    2 boxes=1 month   insulin degludec (TRESIBA FLEXTOUCH) 100 UNIT/ML FlexTouch Pen    Sig: Inject 30 Units into the skin at bedtime.    Dispense:  15 mL    Refill:  3   tiZANidine (ZANAFLEX) 4 MG tablet    Sig: Take 1 tablet (4 mg total) by mouth every 6 (six) hours as needed for muscle spasms.    Dispense:  30 tablet    Refill:  1     Follow-up: Return in about 1 month (around 04/22/2022).  Claretta Fraise, M.D.

## 2022-03-24 ENCOUNTER — Encounter: Payer: Self-pay | Admitting: Family Medicine

## 2022-03-26 MED ORDER — METFORMIN HCL ER 500 MG PO TB24: 1000.0000 mg | ORAL_TABLET | Freq: Two times a day (BID) | ORAL | 1 refills | Status: DC

## 2022-03-29 DIAGNOSIS — H6691 Otitis media, unspecified, right ear: Secondary | ICD-10-CM | POA: Diagnosis not present

## 2022-03-31 ENCOUNTER — Other Ambulatory Visit: Payer: Self-pay | Admitting: Family Medicine

## 2022-03-31 DIAGNOSIS — I10 Essential (primary) hypertension: Secondary | ICD-10-CM

## 2022-04-04 ENCOUNTER — Other Ambulatory Visit: Payer: Self-pay | Admitting: Family Medicine

## 2022-04-04 DIAGNOSIS — I10 Essential (primary) hypertension: Secondary | ICD-10-CM

## 2022-04-07 ENCOUNTER — Other Ambulatory Visit: Payer: Self-pay | Admitting: Family Medicine

## 2022-04-07 DIAGNOSIS — I10 Essential (primary) hypertension: Secondary | ICD-10-CM

## 2022-04-09 MED ORDER — LISINOPRIL 2.5 MG PO TABS: 2.5000 mg | ORAL_TABLET | Freq: Every day | ORAL | 0 refills | Status: DC

## 2022-04-15 ENCOUNTER — Other Ambulatory Visit: Payer: Self-pay | Admitting: Family Medicine

## 2022-04-17 ENCOUNTER — Other Ambulatory Visit: Payer: Self-pay | Admitting: Family Medicine

## 2022-04-17 DIAGNOSIS — I10 Essential (primary) hypertension: Secondary | ICD-10-CM

## 2022-04-23 ENCOUNTER — Ambulatory Visit: Payer: Medicare Other | Admitting: Family Medicine

## 2022-04-24 LAB — HM DIABETES EYE EXAM

## 2022-05-12 ENCOUNTER — Other Ambulatory Visit: Payer: Self-pay | Admitting: Family Medicine

## 2022-05-12 DIAGNOSIS — I10 Essential (primary) hypertension: Secondary | ICD-10-CM

## 2022-05-14 ENCOUNTER — Ambulatory Visit (INDEPENDENT_AMBULATORY_CARE_PROVIDER_SITE_OTHER): Payer: Medicare Other | Admitting: Family Medicine

## 2022-05-14 ENCOUNTER — Other Ambulatory Visit: Payer: Self-pay | Admitting: Family Medicine

## 2022-05-14 ENCOUNTER — Encounter: Payer: Self-pay | Admitting: Family Medicine

## 2022-05-14 VITALS — BP 98/65 | HR 58 | Temp 97.4°F | Ht 61.0 in | Wt 205.6 lb

## 2022-05-14 DIAGNOSIS — E1165 Type 2 diabetes mellitus with hyperglycemia: Secondary | ICD-10-CM

## 2022-05-14 DIAGNOSIS — Z794 Long term (current) use of insulin: Secondary | ICD-10-CM

## 2022-05-14 DIAGNOSIS — I10 Essential (primary) hypertension: Secondary | ICD-10-CM | POA: Diagnosis not present

## 2022-05-14 DIAGNOSIS — I2510 Atherosclerotic heart disease of native coronary artery without angina pectoris: Secondary | ICD-10-CM | POA: Diagnosis not present

## 2022-05-14 DIAGNOSIS — E782 Mixed hyperlipidemia: Secondary | ICD-10-CM | POA: Diagnosis not present

## 2022-05-14 DIAGNOSIS — M79601 Pain in right arm: Secondary | ICD-10-CM

## 2022-05-14 LAB — BAYER DCA HB A1C WAIVED: HB A1C (BAYER DCA - WAIVED): 7.7 % — ABNORMAL HIGH (ref 4.8–5.6)

## 2022-05-14 MED ORDER — OMEPRAZOLE 40 MG PO CPDR: 40.0000 mg | DELAYED_RELEASE_CAPSULE | Freq: Every day | ORAL | 3 refills | Status: DC

## 2022-05-14 MED ORDER — ROSUVASTATIN CALCIUM 10 MG PO TABS: 10.0000 mg | ORAL_TABLET | Freq: Every day | ORAL | 3 refills | Status: DC

## 2022-05-14 MED ORDER — METOPROLOL TARTRATE 25 MG PO TABS: 25.0000 mg | ORAL_TABLET | Freq: Every day | ORAL | 3 refills | Status: DC

## 2022-05-14 MED ORDER — METOPROLOL TARTRATE 25 MG PO TABS
12.5000 mg | ORAL_TABLET | Freq: Every day | ORAL | 3 refills | Status: DC
Start: 2022-05-14 — End: 2023-05-09

## 2022-05-14 MED ORDER — TRAZODONE HCL 50 MG PO TABS: 50.0000 mg | ORAL_TABLET | Freq: Every day | ORAL | 1 refills | Status: DC

## 2022-05-14 NOTE — Progress Notes (Signed)
Subjective:  Patient ID: Sara Stuart,  female    DOB: 05-09-1955  Age: 67 y.o.    CC: Medical Management of Chronic Issues   HPI Sara Stuart presents for  follow-up of hypertension. Patient has no history of headache chest pain or shortness of breath or recent cough. Patient also denies symptoms of TIA such as numbness weakness lateralizing. Patient denies side effects from medication. States taking it regularly.  Patient also  in for follow-up of elevated cholesterol. Doing well without complaints on current medication. Denies side effects  including myalgia and arthralgia and nausea. Also in today for liver function testing. Currently no chest pain, shortness of breath or other cardiovascular related symptoms noted.  Follow-up of diabetes. Patient does check blood sugar at home. Readings run between 60 and 330 Patient denies symptoms such as excessive hunger or urinary frequency, excessive hunger, nausea Three No significant hypoglycemic spells noted in the last 2 weeks.  Medications reviewed. Pt reports taking them regularly. Pt. denies complication/adverse reaction today.  Realizes she needs to cut back on french fries and hush puppies  History Sara Stuart has a past medical history of Arthritis, Chronic back pain, DDD (degenerative disc disease), lumbosacral, Diabetes mellitus without complication (HCC) (diagnosed in 02/2014), GERD (gastroesophageal reflux disease), Gout, Hypertension, Nocturia, Polio, PONV (postoperative nausea and vomiting), and Wears glasses.   She has a past surgical history that includes Foot surgery; surgery for polio; bladder tacked ; Back surgery (07/2012); Tubal ligation; Colonoscopy; Total abdominal hysterectomy (2002); and Shoulder arthroscopy with rotator cuff repair (Right, 06/15/2021).   Her family history includes Cancer in her sister; Colon cancer in her mother; Diabetes in her father; Heart attack in her father; Thyroid disease in her father.She reports that  she has never smoked. She has never used smokeless tobacco. She reports that she does not drink alcohol and does not use drugs.  Current Outpatient Medications on File Prior to Visit  Medication Sig Dispense Refill   acetaminophen (TYLENOL) 500 MG tablet Take 1 tablet (500 mg total) by mouth every 6 (six) hours as needed for moderate pain. 30 tablet 0   aspirin EC 81 MG tablet Take 1 tablet (81 mg total) by mouth daily. 30 tablet    Continuous Blood Gluc Receiver (FREESTYLE LIBRE 2 READER) DEVI Use to test blood sugar continuously. DX: E11.65 1 each 0   Continuous Blood Gluc Sensor (FREESTYLE LIBRE 2 SENSOR) MISC Use to test blood sugar continuously.  Replace sensor every 14 days. DX: E11.65 2 each 11   dapagliflozin propanediol (FARXIGA) 10 MG TABS tablet Take 1 tablet (10 mg total) by mouth daily before breakfast. 90 tablet 4   DILT-XR 120 MG 24 hr capsule Take 1 capsule by mouth once daily 90 capsule 0   fluticasone (FLONASE) 50 MCG/ACT nasal spray Place 2 sprays into both nostrils daily as needed.     gabapentin (NEURONTIN) 300 MG capsule Take 1 capsule by mouth twice daily 180 capsule 0   glucose blood test strip Test BS four times daily Dx E11.65 400 each 3   HYDROcodone-acetaminophen (NORCO/VICODIN) 5-325 MG tablet Take 2 tablets by mouth every 12 (twelve) hours as needed for severe pain. 6 tablet 0   ibuprofen (ADVIL) 800 MG tablet TAKE 1 TABLET BY MOUTH EVERY 8 HOURS AS NEEDED FOR MODERATE PAIN 90 tablet 0   insulin degludec (TRESIBA FLEXTOUCH) 100 UNIT/ML FlexTouch Pen Inject 30 Units into the skin at bedtime. 15 mL 3   insulin isophane & regular human  KwikPen (HUMULIN 70/30 KWIKPEN) (70-30) 100 UNIT/ML KwikPen Inject 30 Units into the skin with breakfast, and 20 with evening meal. 30 mL 11   Insulin Pen Needle (B-D ULTRAFINE III SHORT PEN) 31G X 8 MM MISC 1 each by Does not apply route as directed. 100 each 3   lisinopril (ZESTRIL) 2.5 MG tablet Take 1 tablet (2.5 mg total) by mouth  daily. 90 tablet 0   metFORMIN (GLUCOPHAGE-XR) 500 MG 24 hr tablet Take 2 tablets (1,000 mg total) by mouth 2 (two) times daily. 360 tablet 1   metroNIDAZOLE (FLAGYL) 500 MG tablet Take 1 tablet (500 mg total) by mouth 2 (two) times daily. 14 tablet 0   nitroGLYCERIN (NITROSTAT) 0.4 MG SL tablet DISSOLVE ONE TABLET UNDER THE TONGUE EVERY 5 MINUTES AS NEEDED FOR CHEST PAIN.  DO NOT EXCEED A TOTAL OF 3 DOSES IN 15 MINUTES 25 tablet 3   ondansetron (ZOFRAN) 4 MG tablet Take 1 tablet (4 mg total) by mouth every 8 (eight) hours as needed for nausea or vomiting. 20 tablet 0   spironolactone (ALDACTONE) 25 MG tablet Take 1/2 (one-half) tablet by mouth once daily 45 tablet 0   tiZANidine (ZANAFLEX) 4 MG tablet Take 1 tablet (4 mg total) by mouth every 6 (six) hours as needed for muscle spasms. 30 tablet 1   triamterene-hydrochlorothiazide (DYAZIDE) 37.5-25 MG capsule Take 1 capsule by mouth once daily 30 capsule 0   No current facility-administered medications on file prior to visit.    ROS Review of Systems  Constitutional: Negative.   HENT: Negative.    Eyes:  Negative for visual disturbance.  Respiratory:  Negative for shortness of breath.   Cardiovascular:  Negative for chest pain.  Gastrointestinal:  Negative for abdominal pain.  Musculoskeletal:  Negative for arthralgias.    Objective:  BP 98/65   Pulse (!) 58   Temp (!) 97.4 F (36.3 C)   Ht 5\' 1"  (1.549 m)   Wt 205 lb 9.6 oz (93.3 kg)   SpO2 94%   BMI 38.85 kg/m   BP Readings from Last 3 Encounters:  05/14/22 98/65  03/22/22 108/66  03/08/22 98/69    Wt Readings from Last 3 Encounters:  05/14/22 205 lb 9.6 oz (93.3 kg)  03/22/22 207 lb 6.4 oz (94.1 kg)  03/08/22 205 lb (93 kg)     Physical Exam Constitutional:      General: She is not in acute distress.    Appearance: She is well-developed.  Cardiovascular:     Rate and Rhythm: Normal rate and regular rhythm.  Pulmonary:     Breath sounds: Normal breath sounds.   Musculoskeletal:        General: Normal range of motion.  Skin:    General: Skin is warm and dry.  Neurological:     Mental Status: She is alert and oriented to person, place, and time.     Diabetic Foot Exam - Simple   Simple Foot Form Diabetic Foot exam was performed with the following findings: Yes 05/14/2022  2:42 PM  Visual Inspection No deformities, no ulcerations, no other skin breakdown bilaterally: Yes Sensation Testing Intact to touch and monofilament testing bilaterally: Yes Pulse Check Posterior Tibialis and Dorsalis pulse intact bilaterally: Yes Comments     Lab Results  Component Value Date   HGBA1C 7.7 (H) 05/14/2022   HGBA1C 8.6 (H) 02/20/2022   HGBA1C 8.1 (H) 11/20/2021    Assessment & Plan:   Sara Stuart was seen today for medical management  of chronic issues.  Diagnoses and all orders for this visit:  Uncontrolled type 2 diabetes mellitus with hyperglycemia, with long-term current use of insulin (HCC) -     Bayer DCA Hb A1c Waived  Mixed hyperlipidemia -     Lipid panel -     rosuvastatin (CRESTOR) 10 MG tablet; Take 1 tablet (10 mg total) by mouth daily.  Essential hypertension, benign -     CBC with Differential/Platelet -     CMP14+EGFR -     Discontinue: metoprolol tartrate (LOPRESSOR) 25 MG tablet; Take 1 tablet (25 mg total) by mouth daily. -     metoprolol tartrate (LOPRESSOR) 25 MG tablet; Take 0.5 tablets (12.5 mg total) by mouth daily.  Coronary artery disease involving native coronary artery of native heart without angina pectoris -     rosuvastatin (CRESTOR) 10 MG tablet; Take 1 tablet (10 mg total) by mouth daily.  Other orders -     omeprazole (PRILOSEC) 40 MG capsule; Take 1 capsule (40 mg total) by mouth daily. -     traZODone (DESYREL) 50 MG tablet; Take 1 tablet (50 mg total) by mouth at bedtime.   I have discontinued Sara Stuart "[name]"'s metoprolol tartrate. I have also changed her omeprazole, rosuvastatin, traZODone, and  metoprolol tartrate. Additionally, I am having her maintain her aspirin EC, Insulin Pen Needle, fluticasone, glucose blood, FreeStyle Libre 2 Sensor, [employer] 2 Reader, dapagliflozin propanediol, nitroGLYCERIN, acetaminophen, HYDROcodone-acetaminophen, metroNIDAZOLE, Dilt-XR, ondansetron, HumuLIN 70/30 KwikPen, Tresiba FlexTouch, tiZANidine, metFORMIN, lisinopril, ibuprofen, gabapentin, triamterene-hydrochlorothiazide, and spironolactone.  Decrease Humulin at hs to 25 units   Follow-up: Return in about 3 months (around 08/14/2022).  Mechele Claude, M.D.

## 2022-05-15 LAB — CBC WITH DIFFERENTIAL/PLATELET
Basophils Absolute: 0.1 10*3/uL (ref 0.0–0.2)
Basos: 1 %
EOS (ABSOLUTE): 0.3 10*3/uL (ref 0.0–0.4)
Eos: 4 %
Hematocrit: 41 % (ref 34.0–46.6)
Hemoglobin: 12.6 g/dL (ref 11.1–15.9)
Immature Grans (Abs): 0 10*3/uL (ref 0.0–0.1)
Immature Granulocytes: 0 %
Lymphocytes Absolute: 2.5 10*3/uL (ref 0.7–3.1)
Lymphs: 27 %
MCH: 25.5 pg — ABNORMAL LOW (ref 26.6–33.0)
MCHC: 30.7 g/dL — ABNORMAL LOW (ref 31.5–35.7)
MCV: 83 fL (ref 79–97)
Monocytes Absolute: 0.6 10*3/uL (ref 0.1–0.9)
Monocytes: 6 %
Neutrophils Absolute: 5.8 10*3/uL (ref 1.4–7.0)
Neutrophils: 62 %
Platelets: 301 10*3/uL (ref 150–450)
RBC: 4.95 x10E6/uL (ref 3.77–5.28)
RDW: 14.1 % (ref 11.7–15.4)
WBC: 9.2 10*3/uL (ref 3.4–10.8)

## 2022-05-15 LAB — LIPID PANEL
Chol/HDL Ratio: 2.8 ratio (ref 0.0–4.4)
Cholesterol, Total: 146 mg/dL (ref 100–199)
HDL: 53 mg/dL (ref >39)
LDL Chol Calc (NIH): 68 mg/dL (ref 0–99)
Triglycerides: 146 mg/dL (ref 0–149)
VLDL Cholesterol Cal: 25 mg/dL (ref 5–40)

## 2022-05-15 LAB — CMP14+EGFR
ALT: 14 IU/L (ref 0–32)
AST: 17 IU/L (ref 0–40)
Albumin/Globulin Ratio: 1.7 (ref 1.2–2.2)
Albumin: 4.3 g/dL (ref 3.9–4.9)
Alkaline Phosphatase: 78 IU/L (ref 44–121)
BUN/Creatinine Ratio: 26 (ref 12–28)
BUN: 25 mg/dL (ref 8–27)
Bilirubin Total: 0.3 mg/dL (ref 0.0–1.2)
CO2: 21 mmol/L (ref 20–29)
Calcium: 9.3 mg/dL (ref 8.7–10.3)
Chloride: 102 mmol/L (ref 96–106)
Creatinine, Ser: 0.96 mg/dL (ref 0.57–1.00)
Globulin, Total: 2.5 g/dL (ref 1.5–4.5)
Glucose: 104 mg/dL — ABNORMAL HIGH (ref 70–99)
Potassium: 4.2 mmol/L (ref 3.5–5.2)
Sodium: 137 mmol/L (ref 134–144)
Total Protein: 6.8 g/dL (ref 6.0–8.5)
eGFR: 65 mL/min/{1.73_m2} (ref >59)

## 2022-05-15 NOTE — Progress Notes (Signed)
Hello Mialani,  Your lab result is normal and/or stable.Some minor variations that are not significant are commonly marked abnormal, but do not represent any medical problem for you.  Best regards, Cherylin Waguespack, M.D.

## 2022-05-21 DIAGNOSIS — E1165 Type 2 diabetes mellitus with hyperglycemia: Secondary | ICD-10-CM | POA: Diagnosis not present

## 2022-05-24 ENCOUNTER — Other Ambulatory Visit: Payer: Self-pay | Admitting: Pharmacist

## 2022-05-24 NOTE — Progress Notes (Signed)
    05/24/2022 Name: Sara Stuart MRN: [id number] DOB: 1955/08/01   Discussed type 2 diabetes.  Patient has made positive changes and has decreased her A1c to 7.7% (from 8.4%).  Her insulin is here for pick up and she is to continue current regimen.  She is enjoying her libre 2 CGM which she fills at [employer].  I will need to clarify where she is currently filling.  Denies hypoglycemia.  Continues to work on diet and food choices.  LDL at goal    Lab Results  Component Value Date   HGBA1C 7.7 (H) 05/14/2022   Lipid Panel     Component Value Date/Time   CHOL 146 05/14/2022 1356   TRIG 146 05/14/2022 1356   HDL 53 05/14/2022 1356   CHOLHDL 2.8 05/14/2022 1356   CHOLHDL 3.8 05/02/2017 1617   LDLCALC 68 05/14/2022 1356   LDLCALC 111 (H) 05/02/2017 1617    F/u pharmd on 06/15/22   Kieth Brightly, PharmD, BCACP Clinical Pharmacist, [facility]

## 2022-05-31 ENCOUNTER — Other Ambulatory Visit: Payer: Self-pay | Admitting: Family Medicine

## 2022-05-31 DIAGNOSIS — I10 Essential (primary) hypertension: Secondary | ICD-10-CM

## 2022-06-15 ENCOUNTER — Ambulatory Visit (INDEPENDENT_AMBULATORY_CARE_PROVIDER_SITE_OTHER): Payer: Medicare Other | Admitting: Pharmacist

## 2022-06-15 DIAGNOSIS — Z794 Long term (current) use of insulin: Secondary | ICD-10-CM

## 2022-06-15 DIAGNOSIS — E1165 Type 2 diabetes mellitus with hyperglycemia: Secondary | ICD-10-CM

## 2022-06-23 ENCOUNTER — Other Ambulatory Visit: Payer: Self-pay | Admitting: Family Medicine

## 2022-06-23 DIAGNOSIS — I10 Essential (primary) hypertension: Secondary | ICD-10-CM

## 2022-06-28 NOTE — Progress Notes (Signed)
   Pharmacy CGM Follow up  Subjective:     Patient ID: Sara Stuart, female    DOB: November 07, 1955, 67 y.o.   MRN: [id number]  [name] 3 CGM send to [facility] via [facility] portal.  ADS reports patient has not answered her phone & they must speak to her before shipping out CGM.  Requested patient call [phone].  I will f/u with her once CGM received.  We are attempting to upgrade the patient to the libre 3 CGM system.  She is currently wearing libre 2 CGM. A1c improved to 7.7%     Kieth Brightly, PharmD, BCACP Clinical Pharmacist, [facility]

## 2022-07-13 ENCOUNTER — Ambulatory Visit (INDEPENDENT_AMBULATORY_CARE_PROVIDER_SITE_OTHER): Payer: Medicare Other | Admitting: Pharmacist

## 2022-07-13 DIAGNOSIS — E1165 Type 2 diabetes mellitus with hyperglycemia: Secondary | ICD-10-CM

## 2022-07-13 DIAGNOSIS — Z794 Long term (current) use of insulin: Secondary | ICD-10-CM

## 2022-07-13 MED ORDER — DAPAGLIFLOZIN PROPANEDIOL 10 MG PO TABS
10.0000 mg | ORAL_TABLET | Freq: Every day | ORAL | 4 refills | Status: DC
Start: 2022-07-13 — End: 2022-11-02

## 2022-07-13 NOTE — Progress Notes (Signed)
07/13/2022 Name: Dixie Irion MRN: [id number] DOB: [birth year]  Chief Complaint  Patient presents with   Diabetes    Marsheila Teale is a 67 y.o. year old female who presented for a telephone visit.  I connected with  Mallie Mussel on 07/26/22 by telephone and verified that I am speaking with the correct person using two identifiers.  Patient was at home during this visit and PharmD was located in PCP office.   They were referred to the pharmacist by their PCP for assistance in managing diabetes and medication access.    Subjective:  Care Team: Primary Care Provider: Mechele Claude, MD   Medication Access/Adherence  Current Pharmacy:  [facility] [address], [place] - [address] [place] [address] Phone: [phone] Fax: [phone]  [facility] - [place], [place] - [address] [place] [address] Phone: [phone] Fax: [phone]   Patient reports affordability concerns with their medications: Yes  Patient reports access/transportation concerns to their pharmacy: No  Patient reports adherence concerns with their medications:  No     Diabetes:  Current medications: tresiba, 75/25 mix, metformin, farxiga Medications tried in the past: trulicity  Current glucose readings: fbg<135; some hypoglycemia mid day if dinner is later (overlap from mixed insulins) Using LIBRE 2 meter  -Patient is testing blood sugar 6 times daily -Patient is injecting insulin 4 or more times daily -She greatly benefits from her continuous glucose monitoring system  (libre2)  Patient denies hypoglycemic s/sx including dizziness, shakiness, sweating. Patient denies hyperglycemic symptoms including polyuria, polydipsia, polyphagia, nocturia, neuropathy, blurred vision.  Current meal patterns:  The patient is asked to make an attempt to improve diet and exercise patterns to aid in medical management of this problem. Discussed meal planning  options and Plate method for healthy eating Avoid sugary drinks and desserts Incorporate balanced protein, non starchy veggies, 1 serving of carbohydrate with each meal Increase water intake Increase physical activity as able  Current physical activity: encouraged as able  Current medication access support: refer to Active FYI tabs   Objective:  Lab Results  Component Value Date   HGBA1C 7.7 (H) 05/14/2022    Lab Results  Component Value Date   CREATININE 0.96 05/14/2022   BUN 25 05/14/2022   NA 137 05/14/2022   K 4.2 05/14/2022   CL 102 05/14/2022   CO2 21 05/14/2022    Lab Results  Component Value Date   CHOL 146 05/14/2022   HDL 53 05/14/2022   LDLCALC 68 05/14/2022   TRIG 146 05/14/2022   CHOLHDL 2.8 05/14/2022    Medications Reviewed Today   Medications were not reviewed in this encounter       Assessment/Plan:   Diabetes: - Currently uncontrolled, but patient has improved - Reviewed long term cardiovascular and renal outcomes of uncontrolled blood sugar - Reviewed goal A1c, goal fasting, and goal 2 hour post prandial glucose - Reviewed dietary modifications including FOLLOWING A HEART HEALTHY DIET/HEALTHY PLATE METHOD - Recommend to : Continue Tresiba 30 units Continue Humalog 75/25 twice daily 20 units (appears to overlap and patient waits to eat dinner later--can result in hypoglycemia); would like to transition patient to simply short acting (not mix) to avoid hypoglycemia, however patient reports she has tried this in the past and it resulted in hyperglycemia all day; encouraged patient to eat 3 steady meals daily to avoid hypoglycemia Continue metformin-GFR 65 Continue Farxiga - Recommend to check  glucose continuously using Libre 2 CGM - Refill sent for Farxiga to [facility]   Follow Up Plan: 3 months with pharmD 20 min of care spent with this patient  Kieth Brightly, PharmD, BCACP Clinical Pharmacist, [facility]

## 2022-07-26 ENCOUNTER — Encounter: Payer: Self-pay | Admitting: Pharmacist

## 2022-07-26 MED ORDER — INSULIN SYRINGE-NEEDLE U-100 30G X 1/2" 1 ML MISC
5 refills | Status: AC
Start: 2022-07-26 — End: ?

## 2022-07-27 ENCOUNTER — Other Ambulatory Visit: Payer: Self-pay | Admitting: Family Medicine

## 2022-07-27 DIAGNOSIS — M79601 Pain in right arm: Secondary | ICD-10-CM

## 2022-08-02 ENCOUNTER — Other Ambulatory Visit: Payer: Self-pay | Admitting: Family Medicine

## 2022-08-02 DIAGNOSIS — M79601 Pain in right arm: Secondary | ICD-10-CM

## 2022-08-11 ENCOUNTER — Other Ambulatory Visit: Payer: Self-pay | Admitting: Family Medicine

## 2022-08-11 DIAGNOSIS — M79601 Pain in right arm: Secondary | ICD-10-CM

## 2022-08-15 ENCOUNTER — Encounter: Payer: Self-pay | Admitting: Family Medicine

## 2022-08-15 ENCOUNTER — Ambulatory Visit (INDEPENDENT_AMBULATORY_CARE_PROVIDER_SITE_OTHER): Payer: Medicare Other | Admitting: Family Medicine

## 2022-08-15 VITALS — BP 102/67 | HR 60 | Temp 96.9°F | Ht 61.0 in | Wt 205.2 lb

## 2022-08-15 DIAGNOSIS — E1140 Type 2 diabetes mellitus with diabetic neuropathy, unspecified: Secondary | ICD-10-CM

## 2022-08-15 DIAGNOSIS — E1165 Type 2 diabetes mellitus with hyperglycemia: Secondary | ICD-10-CM | POA: Diagnosis not present

## 2022-08-15 DIAGNOSIS — Z794 Long term (current) use of insulin: Secondary | ICD-10-CM

## 2022-08-15 DIAGNOSIS — I10 Essential (primary) hypertension: Secondary | ICD-10-CM

## 2022-08-15 DIAGNOSIS — E782 Mixed hyperlipidemia: Secondary | ICD-10-CM | POA: Diagnosis not present

## 2022-08-15 LAB — BAYER DCA HB A1C WAIVED: HB A1C (BAYER DCA - WAIVED): 7.2 % — ABNORMAL HIGH (ref 4.8–5.6)

## 2022-08-15 MED ORDER — TRESIBA FLEXTOUCH 100 UNIT/ML ~~LOC~~ SOPN
40.0000 [IU] | PEN_INJECTOR | Freq: Every day | SUBCUTANEOUS | 3 refills | Status: DC
Start: 2022-08-15 — End: 2022-09-19

## 2022-08-15 MED ORDER — TIZANIDINE HCL 4 MG PO TABS: 4.0000 mg | ORAL_TABLET | Freq: Every day | ORAL | 1 refills | Status: DC

## 2022-08-15 MED ORDER — SPIRONOLACTONE 25 MG PO TABS
ORAL_TABLET | ORAL | 3 refills | Status: DC
Start: 2022-08-15 — End: 2023-07-02

## 2022-08-15 MED ORDER — GABAPENTIN 300 MG PO CAPS: ORAL_CAPSULE | ORAL | 1 refills | Status: DC

## 2022-08-15 MED ORDER — HUMULIN 70/30 KWIKPEN (70-30) 100 UNIT/ML ~~LOC~~ SUPN
PEN_INJECTOR | SUBCUTANEOUS | 11 refills | Status: DC
Start: 2022-08-15 — End: 2022-09-19

## 2022-08-15 MED ORDER — TRIAMTERENE-HCTZ 37.5-25 MG PO CAPS
1.0000 | ORAL_CAPSULE | Freq: Every day | ORAL | 3 refills | Status: DC
Start: 2022-08-15 — End: 2023-07-02

## 2022-08-15 MED ORDER — LISINOPRIL 2.5 MG PO TABS
2.5000 mg | ORAL_TABLET | Freq: Every day | ORAL | 3 refills | Status: DC
Start: 2022-08-15 — End: 2023-07-02

## 2022-08-15 NOTE — Progress Notes (Signed)
Subjective:  Patient ID: Sara Stuart,  female    DOB: 1955-06-12  Age: 67 y.o.    CC: Medical Management of Chronic Issues   HPI Kristle Wojahn presents for  follow-up of hypertension. Patient has no history of headache chest pain or shortness of breath or recent cough. Patient also denies symptoms of TIA such as numbness weakness lateralizing. Patient denies side effects from medication. States taking it regularly.  Patient also  in for follow-up of elevated cholesterol. Doing well without complaints on current medication. Denies side effects  including myalgia and arthralgia and nausea. Also in today for liver function testing. Currently no chest pain, shortness of breath or other cardiovascular related symptoms noted.  Follow-up of diabetes. Patient does check blood sugar at home. Readings run between 98-140 fasting and 0130-150 prandial Patient denies symptoms such as excessive hunger or urinary frequency, excessive hunger, nausea Multiple  hypoglycemic spells noted.Most overnight, some in the afternoon. Medications reviewed. Using 70/30 insulin 25 at breakfast, 20-25 at lunch and 25 at supper PLUS 25 Tresiba at hs  History Ghalia has a past medical history of Arthritis, Chronic back pain, DDD (degenerative disc disease), lumbosacral, Diabetes mellitus without complication (HCC) (diagnosed in 02/2014), GERD (gastroesophageal reflux disease), Gout, Hypertension, Nocturia, Polio, PONV (postoperative nausea and vomiting), and Wears glasses.   She has a past surgical history that includes Foot surgery; surgery for polio; bladder tacked ; Back surgery (07/2012); Tubal ligation; Colonoscopy; Total abdominal hysterectomy (2002); and Shoulder arthroscopy with rotator cuff repair (Right, 06/15/2021).   Her family history includes Cancer in her sister; Colon cancer in her mother; Diabetes in her father; Heart attack in her father; Thyroid disease in her father.She reports that she has never smoked. She  has never used smokeless tobacco. She reports that she does not drink alcohol and does not use drugs.  Current Outpatient Medications on File Prior to Visit  Medication Sig Dispense Refill   acetaminophen (TYLENOL) 500 MG tablet Take 1 tablet (500 mg total) by mouth every 6 (six) hours as needed for moderate pain. 30 tablet 0   aspirin EC 81 MG tablet Take 1 tablet (81 mg total) by mouth daily. 30 tablet    Continuous Blood Gluc Receiver (FREESTYLE LIBRE 2 READER) DEVI Use to test blood sugar continuously. DX: E11.65 1 each 0   Continuous Blood Gluc Sensor (FREESTYLE LIBRE 2 SENSOR) MISC Use to test blood sugar continuously.  Replace sensor every 14 days. DX: E11.65 2 each 11   dapagliflozin propanediol (FARXIGA) 10 MG TABS tablet Take 1 tablet (10 mg total) by mouth daily before breakfast. 90 tablet 4   DILT-XR 120 MG 24 hr capsule Take 1 capsule by mouth once daily 90 capsule 0   fluticasone (FLONASE) 50 MCG/ACT nasal spray Place 2 sprays into both nostrils daily as needed.     ibuprofen (ADVIL) 800 MG tablet TAKE 1 TABLET BY MOUTH EVERY 8 HOURS AS NEEDED FOR MODERATE PAIN 90 tablet 0   Insulin Pen Needle (B-D ULTRAFINE III SHORT PEN) 31G X 8 MM MISC 1 each by Does not apply route as directed. 100 each 3   Insulin Syringe-Needle U-100 (B-D INS SYR ULTRAFINE 1CC/30G) 30G X 1/2" 1 ML MISC Use to inject insulin 3 times daily as directed; DX: E11.65 100 each 5   metFORMIN (GLUCOPHAGE-XR) 500 MG 24 hr tablet Take 2 tablets (1,000 mg total) by mouth 2 (two) times daily. 360 tablet 1   metoprolol tartrate (LOPRESSOR) 25 MG tablet Take  0.5 tablets (12.5 mg total) by mouth daily. 45 tablet 3   metroNIDAZOLE (FLAGYL) 500 MG tablet Take 1 tablet (500 mg total) by mouth 2 (two) times daily. 14 tablet 0   nitroGLYCERIN (NITROSTAT) 0.4 MG SL tablet DISSOLVE ONE TABLET UNDER THE TONGUE EVERY 5 MINUTES AS NEEDED FOR CHEST PAIN.  DO NOT EXCEED A TOTAL OF 3 DOSES IN 15 MINUTES 25 tablet 3   omeprazole (PRILOSEC)  40 MG capsule Take 1 capsule (40 mg total) by mouth daily. 90 capsule 3   ondansetron (ZOFRAN) 4 MG tablet Take 1 tablet (4 mg total) by mouth every 8 (eight) hours as needed for nausea or vomiting. 20 tablet 0   rosuvastatin (CRESTOR) 10 MG tablet Take 1 tablet (10 mg total) by mouth daily. 90 tablet 3   traZODone (DESYREL) 50 MG tablet Take 1 tablet (50 mg total) by mouth at bedtime. 90 tablet 1   No current facility-administered medications on file prior to visit.    ROS Review of Systems  Objective:  BP 102/67   Pulse 60   Temp (!) 96.9 F (36.1 C)   Ht 5\' 1"  (1.549 m)   Wt 205 lb 3.2 oz (93.1 kg)   SpO2 96%   BMI 38.77 kg/m   BP Readings from Last 3 Encounters:  08/15/22 102/67  05/14/22 98/65  03/22/22 108/66    Wt Readings from Last 3 Encounters:  08/15/22 205 lb 3.2 oz (93.1 kg)  05/14/22 205 lb 9.6 oz (93.3 kg)  03/22/22 207 lb 6.4 oz (94.1 kg)     Physical Exam  Diabetic Foot Exam - Simple   No data filed     Lab Results  Component Value Date   HGBA1C 7.7 (H) 05/14/2022   HGBA1C 8.6 (H) 02/20/2022   HGBA1C 8.1 (H) 11/20/2021    Assessment & Plan:   Berline Lopes "[name]" was seen today for medical management of chronic issues.  Diagnoses and all orders for this visit:  Uncontrolled type 2 diabetes mellitus with hyperglycemia, with long-term current use of insulin (HCC) -     Bayer DCA Hb A1c Waived -     insulin isophane & regular human KwikPen (HUMULIN 70/30 KWIKPEN) (70-30) 100 UNIT/ML KwikPen; Inject 35 Units into the skin with breakfast, and 15 with evening meal. DO NOT inject at lunchtime or any other time -     insulin degludec (TRESIBA FLEXTOUCH) 100 UNIT/ML FlexTouch Pen; Inject 40 Units into the skin at bedtime.  Mixed hyperlipidemia -     Lipid panel  Essential hypertension, benign -     CBC with Differential/Platelet -     CMP14+EGFR -     lisinopril (ZESTRIL) 2.5 MG tablet; Take 1 tablet (2.5 mg total) by mouth daily. -      spironolactone (ALDACTONE) 25 MG tablet; Take 1/2 (one-half) tablet by mouth once daily -     triamterene-hydrochlorothiazide (DYAZIDE) 37.5-25 MG capsule; Take 1 each (1 capsule total) by mouth daily.  Type 2 diabetes mellitus with diabetic neuropathy, with long-term current use of insulin (HCC)  Other orders -     gabapentin (NEURONTIN) 300 MG capsule; One each morning and two each evening -     tiZANidine (ZANAFLEX) 4 MG tablet; Take 1 tablet (4 mg total) by mouth at bedtime.   I have discontinued Sara Stuart "[name]"'s glucose blood and HYDROcodone-acetaminophen. I have also changed her gabapentin, lisinopril, triamterene-hydrochlorothiazide, tiZANidine, HumuLIN 70/30 KwikPen, and [employer]. Additionally, I am having her maintain her aspirin  EC, Insulin Pen Needle, fluticasone, FreeStyle Libre 2 Sensor, [employer] 2 Reader, nitroGLYCERIN, acetaminophen, metroNIDAZOLE, ondansetron, metFORMIN, ibuprofen, omeprazole, rosuvastatin, traZODone, metoprolol tartrate, Dilt-XR, dapagliflozin propanediol, Insulin Syringe-Needle U-100, and spironolactone.  Meds ordered this encounter  Medications   gabapentin (NEURONTIN) 300 MG capsule    Sig: One each morning and two each evening    Dispense:  270 capsule    Refill:  1   lisinopril (ZESTRIL) 2.5 MG tablet    Sig: Take 1 tablet (2.5 mg total) by mouth daily.    Dispense:  90 tablet    Refill:  3   spironolactone (ALDACTONE) 25 MG tablet    Sig: Take 1/2 (one-half) tablet by mouth once daily    Dispense:  45 tablet    Refill:  3   triamterene-hydrochlorothiazide (DYAZIDE) 37.5-25 MG capsule    Sig: Take 1 each (1 capsule total) by mouth daily.    Dispense:  90 capsule    Refill:  3   tiZANidine (ZANAFLEX) 4 MG tablet    Sig: Take 1 tablet (4 mg total) by mouth at bedtime.    Dispense:  90 tablet    Refill:  1   insulin isophane & regular human KwikPen (HUMULIN 70/30 KWIKPEN) (70-30) 100 UNIT/ML KwikPen    Sig: Inject 35 Units  into the skin with breakfast, and 15 with evening meal. DO NOT inject at lunchtime or any other time    Dispense:  30 mL    Refill:  11    2 boxes=1 month   insulin degludec (TRESIBA FLEXTOUCH) 100 UNIT/ML FlexTouch Pen    Sig: Inject 40 Units into the skin at bedtime.    Dispense:  45 mL    Refill:  3     Follow-up: Return in about 1 month (around 09/15/2022).  Mechele Claude, M.D.

## 2022-08-16 LAB — CBC WITH DIFFERENTIAL/PLATELET
Basophils Absolute: 0.1 10*3/uL (ref 0.0–0.2)
Basos: 1 %
EOS (ABSOLUTE): 0.6 10*3/uL — ABNORMAL HIGH (ref 0.0–0.4)
Eos: 6 %
Hematocrit: 41.7 % (ref 34.0–46.6)
Hemoglobin: 13.1 g/dL (ref 11.1–15.9)
Immature Grans (Abs): 0 10*3/uL (ref 0.0–0.1)
Immature Granulocytes: 0 %
Lymphocytes Absolute: 2.5 10*3/uL (ref 0.7–3.1)
Lymphs: 23 %
MCH: 25.7 pg — ABNORMAL LOW (ref 26.6–33.0)
MCHC: 31.4 g/dL — ABNORMAL LOW (ref 31.5–35.7)
MCV: 82 fL (ref 79–97)
Monocytes Absolute: 0.7 10*3/uL (ref 0.1–0.9)
Monocytes: 7 %
Neutrophils Absolute: 6.7 10*3/uL (ref 1.4–7.0)
Neutrophils: 63 %
Platelets: 321 10*3/uL (ref 150–450)
RBC: 5.1 x10E6/uL (ref 3.77–5.28)
RDW: 13.5 % (ref 11.7–15.4)
WBC: 10.6 10*3/uL (ref 3.4–10.8)

## 2022-08-16 LAB — CMP14+EGFR
ALT: 21 IU/L (ref 0–32)
AST: 24 IU/L (ref 0–40)
Albumin: 4.2 g/dL (ref 3.9–4.9)
Alkaline Phosphatase: 96 IU/L (ref 44–121)
BUN/Creatinine Ratio: 22 (ref 12–28)
BUN: 24 mg/dL (ref 8–27)
Bilirubin Total: 0.2 mg/dL (ref 0.0–1.2)
CO2: 22 mmol/L (ref 20–29)
Calcium: 9.3 mg/dL (ref 8.7–10.3)
Chloride: 99 mmol/L (ref 96–106)
Creatinine, Ser: 1.07 mg/dL — ABNORMAL HIGH (ref 0.57–1.00)
Globulin, Total: 2.6 g/dL (ref 1.5–4.5)
Glucose: 133 mg/dL — ABNORMAL HIGH (ref 70–99)
Potassium: 4.4 mmol/L (ref 3.5–5.2)
Sodium: 137 mmol/L (ref 134–144)
Total Protein: 6.8 g/dL (ref 6.0–8.5)
eGFR: 57 mL/min/{1.73_m2} — ABNORMAL LOW (ref >59)

## 2022-08-16 LAB — LIPID PANEL
Chol/HDL Ratio: 2.4 ratio (ref 0.0–4.4)
Cholesterol, Total: 145 mg/dL (ref 100–199)
HDL: 60 mg/dL (ref >39)
LDL Chol Calc (NIH): 67 mg/dL (ref 0–99)
Triglycerides: 101 mg/dL (ref 0–149)
VLDL Cholesterol Cal: 18 mg/dL (ref 5–40)

## 2022-08-16 NOTE — Progress Notes (Signed)
Hello Mialani,  Your lab result is normal and/or stable.Some minor variations that are not significant are commonly marked abnormal, but do not represent any medical problem for you.  Best regards, Warren Stacks, M.D.

## 2022-08-18 ENCOUNTER — Other Ambulatory Visit: Payer: Self-pay | Admitting: Family Medicine

## 2022-08-18 ENCOUNTER — Encounter: Payer: Self-pay | Admitting: Family Medicine

## 2022-08-18 DIAGNOSIS — Z794 Long term (current) use of insulin: Secondary | ICD-10-CM

## 2022-08-20 DIAGNOSIS — E1165 Type 2 diabetes mellitus with hyperglycemia: Secondary | ICD-10-CM | POA: Diagnosis not present

## 2022-08-27 ENCOUNTER — Telehealth: Payer: Self-pay

## 2022-08-27 NOTE — Telephone Encounter (Signed)
Patient informed we have received three boxes of Sara Stuart and it will be placed in the refrigerator for her to pick up.

## 2022-09-04 ENCOUNTER — Other Ambulatory Visit: Payer: Self-pay | Admitting: Family Medicine

## 2022-09-04 DIAGNOSIS — I10 Essential (primary) hypertension: Secondary | ICD-10-CM

## 2022-09-19 ENCOUNTER — Encounter: Payer: Self-pay | Admitting: Family Medicine

## 2022-09-19 ENCOUNTER — Ambulatory Visit (INDEPENDENT_AMBULATORY_CARE_PROVIDER_SITE_OTHER): Payer: Medicare Other | Admitting: Family Medicine

## 2022-09-19 VITALS — BP 114/68 | HR 66 | Temp 97.1°F | Ht 61.0 in | Wt 205.2 lb

## 2022-09-19 DIAGNOSIS — E1165 Type 2 diabetes mellitus with hyperglycemia: Secondary | ICD-10-CM | POA: Diagnosis not present

## 2022-09-19 DIAGNOSIS — E11649 Type 2 diabetes mellitus with hypoglycemia without coma: Secondary | ICD-10-CM | POA: Diagnosis not present

## 2022-09-19 DIAGNOSIS — Z794 Long term (current) use of insulin: Secondary | ICD-10-CM | POA: Diagnosis not present

## 2022-09-19 DIAGNOSIS — I10 Essential (primary) hypertension: Secondary | ICD-10-CM

## 2022-09-19 MED ORDER — TRESIBA FLEXTOUCH 100 UNIT/ML ~~LOC~~ SOPN
35.0000 [IU] | PEN_INJECTOR | Freq: Every day | SUBCUTANEOUS | Status: DC
Start: 2022-09-19 — End: 2022-11-22

## 2022-09-19 MED ORDER — AMOXICILLIN-POT CLAVULANATE 875-125 MG PO TABS: 1.0000 | ORAL_TABLET | Freq: Two times a day (BID) | ORAL | 0 refills | Status: DC

## 2022-09-19 MED ORDER — HUMULIN 70/30 KWIKPEN (70-30) 100 UNIT/ML ~~LOC~~ SUPN
PEN_INJECTOR | SUBCUTANEOUS | Status: DC
Start: 2022-09-19 — End: 2022-11-02

## 2022-09-19 MED ORDER — FEXOFENADINE HCL 180 MG PO TABS: 180.0000 mg | ORAL_TABLET | Freq: Every day | ORAL | 11 refills | Status: DC

## 2022-09-19 NOTE — Progress Notes (Signed)
Subjective:  Patient ID: Sara Stuart, female    DOB: 1955-04-10  Age: 67 y.o. MRN: [id number]  CC: Follow-up   HPI Sara Stuart presents forFollow-up of diabetes. Log attached showing overnight lows, bedtime 88-282. Fasting 84-140.  Patient denies symptoms such as polyuria, polydipsia, excessive hunger nausea Lows overnight have required her to snack during the night. Log reviewed, scanned to file.  Medications reviewed. Pt reports taking them regularly. Blending 70/30 BID with Evaristo Bury. .    History Sara Stuart has a past medical history of Arthritis, Chronic back pain, DDD (degenerative disc disease), lumbosacral, Diabetes mellitus without complication (HCC) (diagnosed in 02/2014), GERD (gastroesophageal reflux disease), Gout, Hypertension, Nocturia, Polio, PONV (postoperative nausea and vomiting), and Wears glasses.   She has a past surgical history that includes Foot surgery; surgery for polio; bladder tacked ; Back surgery (07/2012); Tubal ligation; Colonoscopy; Total abdominal hysterectomy (2002); and Shoulder arthroscopy with rotator cuff repair (Right, 06/15/2021).   Her family history includes Cancer in her sister; Colon cancer in her mother; Diabetes in her father; Heart attack in her father; Thyroid disease in her father.She reports that she has never smoked. She has never used smokeless tobacco. She reports that she does not drink alcohol and does not use drugs.  Current Outpatient Medications on File Prior to Visit  Medication Sig Dispense Refill   acetaminophen (TYLENOL) 500 MG tablet Take 1 tablet (500 mg total) by mouth every 6 (six) hours as needed for moderate pain. 30 tablet 0   aspirin EC 81 MG tablet Take 1 tablet (81 mg total) by mouth daily. 30 tablet    Continuous Blood Gluc Receiver (FREESTYLE LIBRE 2 READER) DEVI Use to test blood sugar continuously. DX: E11.65 1 each 0   Continuous Blood Gluc Sensor (FREESTYLE LIBRE 2 SENSOR) MISC Use to test blood sugar continuously.   Replace sensor every 14 days. DX: E11.65 2 each 11   dapagliflozin propanediol (FARXIGA) 10 MG TABS tablet Take 1 tablet (10 mg total) by mouth daily before breakfast. 90 tablet 4   DILT-XR 120 MG 24 hr capsule Take 1 capsule by mouth once daily 90 capsule 0   fluticasone (FLONASE) 50 MCG/ACT nasal spray Place 2 sprays into both nostrils daily as needed.     gabapentin (NEURONTIN) 300 MG capsule One each morning and two each evening 270 capsule 1   ibuprofen (ADVIL) 800 MG tablet TAKE 1 TABLET BY MOUTH EVERY 8 HOURS AS NEEDED FOR MODERATE PAIN 90 tablet 0   Insulin Pen Needle (B-D ULTRAFINE III SHORT PEN) 31G X 8 MM MISC 1 each by Does not apply route as directed. 100 each 3   Insulin Syringe-Needle U-100 (B-D INS SYR ULTRAFINE 1CC/30G) 30G X 1/2" 1 ML MISC Use to inject insulin 3 times daily as directed; DX: E11.65 100 each 5   lisinopril (ZESTRIL) 2.5 MG tablet Take 1 tablet (2.5 mg total) by mouth daily. 90 tablet 3   metFORMIN (GLUCOPHAGE-XR) 500 MG 24 hr tablet Take 2 tablets by mouth twice daily 360 tablet 0   metoprolol tartrate (LOPRESSOR) 25 MG tablet Take 0.5 tablets (12.5 mg total) by mouth daily. 45 tablet 3   metroNIDAZOLE (FLAGYL) 500 MG tablet Take 1 tablet (500 mg total) by mouth 2 (two) times daily. 14 tablet 0   nitroGLYCERIN (NITROSTAT) 0.4 MG SL tablet DISSOLVE ONE TABLET UNDER THE TONGUE EVERY 5 MINUTES AS NEEDED FOR CHEST PAIN.  DO NOT EXCEED A TOTAL OF 3 DOSES IN 15 MINUTES 25 tablet 3  omeprazole (PRILOSEC) 40 MG capsule Take 1 capsule (40 mg total) by mouth daily. 90 capsule 3   ondansetron (ZOFRAN) 4 MG tablet Take 1 tablet (4 mg total) by mouth every 8 (eight) hours as needed for nausea or vomiting. 20 tablet 0   rosuvastatin (CRESTOR) 10 MG tablet Take 1 tablet (10 mg total) by mouth daily. 90 tablet 3   spironolactone (ALDACTONE) 25 MG tablet Take 1/2 (one-half) tablet by mouth once daily 45 tablet 3   tiZANidine (ZANAFLEX) 4 MG tablet Take 1 tablet (4 mg total) by  mouth at bedtime. 90 tablet 1   traZODone (DESYREL) 50 MG tablet Take 1 tablet (50 mg total) by mouth at bedtime. 90 tablet 1   triamterene-hydrochlorothiazide (DYAZIDE) 37.5-25 MG capsule Take 1 each (1 capsule total) by mouth daily. 90 capsule 3   No current facility-administered medications on file prior to visit.    ROS Review of Systems  Constitutional: Negative.   HENT: Negative.    Eyes:  Negative for visual disturbance.  Respiratory:  Negative for shortness of breath.   Cardiovascular:  Negative for chest pain.  Gastrointestinal:  Negative for abdominal pain.  Musculoskeletal:  Negative for arthralgias.    Objective:  BP 114/68   Pulse 66   Temp (!) 97.1 F (36.2 C)   Ht 5\' 1"  (1.549 m)   Wt 205 lb 3.2 oz (93.1 kg)   SpO2 95%   BMI 38.77 kg/m   BP Readings from Last 3 Encounters:  09/19/22 114/68  08/15/22 102/67  05/14/22 98/65    Wt Readings from Last 3 Encounters:  09/19/22 205 lb 3.2 oz (93.1 kg)  08/15/22 205 lb 3.2 oz (93.1 kg)  05/14/22 205 lb 9.6 oz (93.3 kg)     Physical Exam Constitutional:      General: She is not in acute distress.    Appearance: She is well-developed.  Cardiovascular:     Rate and Rhythm: Normal rate and regular rhythm.  Pulmonary:     Breath sounds: Normal breath sounds.  Musculoskeletal:        General: Normal range of motion.  Skin:    General: Skin is warm and dry.  Neurological:     Mental Status: She is alert and oriented to person, place, and time.       Assessment & Plan:   Sara Stuart" was seen today for follow-up.  Diagnoses and all orders for this visit:  Diabetic hypoglycemia (HCC)  Uncontrolled type 2 diabetes mellitus with hyperglycemia, with long-term current use of insulin (HCC) -     insulin isophane & regular human KwikPen (HUMULIN 70/30 KWIKPEN) (70-30) 100 UNIT/ML KwikPen; Inject 37 Units into the skin with breakfast, and 17 with evening meal. DO NOT inject at lunchtime or any other  time -     insulin degludec (TRESIBA FLEXTOUCH) 100 UNIT/ML FlexTouch Pen; Inject 35 Units into the skin at bedtime.  Essential hypertension, benign  Other orders -     fexofenadine (ALLEGRA) 180 MG tablet; Take 1 tablet (180 mg total) by mouth daily. For allergy symptoms -     amoxicillin-clavulanate (AUGMENTIN) 875-125 MG tablet; Take 1 tablet by mouth 2 (two) times daily. Take all of this medication      I have changed Sara Stuart "[name]"'s HumuLIN 70/30 KwikPen and [employer]. I am also having her start on fexofenadine and amoxicillin-clavulanate. Additionally, I am having her maintain her aspirin EC, Insulin Pen Needle, fluticasone, FreeStyle Libre 2 Sensor, [employer] 2  Reader, nitroGLYCERIN, acetaminophen, metroNIDAZOLE, ondansetron, ibuprofen, omeprazole, rosuvastatin, traZODone, metoprolol tartrate, dapagliflozin propanediol, Insulin Syringe-Needle U-100, gabapentin, lisinopril, spironolactone, triamterene-hydrochlorothiazide, tiZANidine, metFORMIN, and Dilt-XR.  Meds ordered this encounter  Medications   insulin isophane & regular human KwikPen (HUMULIN 70/30 KWIKPEN) (70-30) 100 UNIT/ML KwikPen    Sig: Inject 37 Units into the skin with breakfast, and 17 with evening meal. DO NOT inject at lunchtime or any other time    2 boxes=1 month   insulin degludec (TRESIBA FLEXTOUCH) 100 UNIT/ML FlexTouch Pen    Sig: Inject 35 Units into the skin at bedtime.   fexofenadine (ALLEGRA) 180 MG tablet    Sig: Take 1 tablet (180 mg total) by mouth daily. For allergy symptoms    Dispense:  30 tablet    Refill:  11   amoxicillin-clavulanate (AUGMENTIN) 875-125 MG tablet    Sig: Take 1 tablet by mouth 2 (two) times daily. Take all of this medication    Dispense:  20 tablet    Refill:  0     Follow-up: Return in about 1 month (around 10/19/2022).  Mechele Claude, M.D.

## 2022-09-21 ENCOUNTER — Telehealth: Payer: Self-pay | Admitting: Pharmacist

## 2022-09-21 NOTE — Telephone Encounter (Signed)
   Patient has [place] surplus at home.  Will continue to store her Evaristo Bury supply in the lab fridge.  PCP has decreased night Tresiba to 35 units.  Patient is still having hypoglycemia.  Recommended to decrease Tresiba to 33 units.  Humulin 70/30 remains at 35 units for breakfast and 17 units for dinner.  Would ideally like to switch to basal/bolus to avoid hypoglycemia.  Patient respectfully declined.  Pen needles left up front for patient to pick up.  Kieth Brightly, PharmD, BCACP Clinical Pharmacist, [facility]

## 2022-09-24 ENCOUNTER — Telehealth (INDEPENDENT_AMBULATORY_CARE_PROVIDER_SITE_OTHER): Payer: Medicare Other | Admitting: Family

## 2022-09-24 ENCOUNTER — Encounter: Payer: Self-pay | Admitting: Family Medicine

## 2022-09-24 ENCOUNTER — Ambulatory Visit: Payer: Medicare Other | Admitting: Family

## 2022-09-24 ENCOUNTER — Encounter: Payer: Self-pay | Admitting: Family

## 2022-09-24 DIAGNOSIS — H6690 Otitis media, unspecified, unspecified ear: Secondary | ICD-10-CM

## 2022-09-24 MED ORDER — AMOXICILLIN-POT CLAVULANATE 875-125 MG PO TABS
1.0000 | ORAL_TABLET | Freq: Two times a day (BID) | ORAL | 0 refills | Status: DC
Start: 2022-09-24 — End: 2022-11-22

## 2022-09-24 NOTE — Progress Notes (Signed)
Virtual Visit Consent   Sara Stuart, you are scheduled for a virtual visit with a [place] provider today. Just as with appointments in the office, your consent must be obtained to participate. Your consent will be active for this visit and any virtual visit you may have with one of our providers in the next 365 days. If you have a MyChart account, a copy of this consent can be sent to you electronically.  As this is a virtual visit, video technology does not allow for your provider to perform a traditional examination. This may limit your provider's ability to fully assess your condition. If your provider identifies any concerns that need to be evaluated in person or the need to arrange testing (such as labs, EKG, etc.), we will make arrangements to do so. Although advances in technology are sophisticated, we cannot ensure that it will always work on either your end or our end. If the connection with a video visit is poor, the visit may have to be switched to a telephone visit. With either a video or telephone visit, we are not always able to ensure that we have a secure connection.  By engaging in this virtual visit, you consent to the provision of healthcare and authorize for your insurance to be billed (if applicable) for the services provided during this visit. Depending on your insurance coverage, you may receive a charge related to this service.  I need to obtain your verbal consent now. Are you willing to proceed with your visit today? Sara Stuart has provided verbal consent on 09/24/2022 for a virtual visit (video or telephone). Jannifer Rodney, FNP  Date: 09/24/2022 4:32 PM  Virtual Visit via Video Note   I, Jannifer Rodney, connected with  Sara Stuart  ([id number], [birth year]) on 09/24/22 at  5:45 PM EDT by a video-enabled telemedicine application and verified that I am speaking with the correct person using two identifiers.  Location: Patient: Virtual Visit Location Patient: Home Provider:  Virtual Visit Location Provider: Office/Clinic   I discussed the limitations of evaluation and management by telemedicine and the availability of in person appointments. The patient expressed understanding and agreed to proceed.    History of Present Illness: Sara Stuart is a 67 y.o. who identifies as a female who was assigned female at birth, and is being seen today for right  ear pain and swelling that started  5 days ago. She saw her PCP who called in Augmentin, but she never picked this up because she states the pharmacy did not have medicine.  HPI: Otalgia  There is pain in the right ear. This is a new problem. The current episode started in the past 7 days. The problem occurs constantly. The problem has been gradually worsening. There has been no fever. The pain is at a severity of 8/10. The pain is moderate. Associated symptoms include headaches. Pertinent negatives include no coughing, diarrhea, hearing loss, neck pain, rash, rhinorrhea, sore throat or vomiting. Treatments tried: allegra. The treatment provided mild relief.    Problems:  Patient Active Problem List   Diagnosis Date Noted   Type 2 diabetes mellitus with diabetic neuropathy, with long-term current use of insulin (HCC) 08/15/2022   Aortic atherosclerosis (HCC) 11/01/2020   Insomnia 12/18/2019   Coronary artery disease involving native coronary artery of native heart without angina pectoris 02/03/2019   Mixed hyperlipidemia 10/10/2017   Uncontrolled type 2 diabetes mellitus with hyperglycemia, with long-term current use of insulin (HCC) 03/14/2017   Essential hypertension, benign  03/14/2017   GERD (gastroesophageal reflux disease) 12/29/2012   History of gout 12/29/2012   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   Morbid obesity (HCC) 12/29/2012   Degenerative spondylolisthesis 07/08/2012   Spinal stenosis, lumbar region, with neurogenic claudication 07/08/2012    Allergies:  Allergies  Allergen Reactions    Trulicity [Dulaglutide] Other (See Comments)    GI intolerance even at low doses   Medications:  Current Outpatient Medications:    amoxicillin-clavulanate (AUGMENTIN) 875-125 MG tablet, Take 1 tablet by mouth 2 (two) times daily., Disp: 14 tablet, Rfl: 0   acetaminophen (TYLENOL) 500 MG tablet, Take 1 tablet (500 mg total) by mouth every 6 (six) hours as needed for moderate pain., Disp: 30 tablet, Rfl: 0   aspirin EC 81 MG tablet, Take 1 tablet (81 mg total) by mouth daily., Disp: 30 tablet, Rfl:    Continuous Blood Gluc Receiver (FREESTYLE LIBRE 2 READER) DEVI, Use to test blood sugar continuously. DX: E11.65, Disp: 1 each, Rfl: 0   Continuous Blood Gluc Sensor (FREESTYLE LIBRE 2 SENSOR) MISC, Use to test blood sugar continuously.  Replace sensor every 14 days. DX: E11.65, Disp: 2 each, Rfl: 11   dapagliflozin propanediol (FARXIGA) 10 MG TABS tablet, Take 1 tablet (10 mg total) by mouth daily before breakfast., Disp: 90 tablet, Rfl: 4   DILT-XR 120 MG 24 hr capsule, Take 1 capsule by mouth once daily, Disp: 90 capsule, Rfl: 0   fexofenadine (ALLEGRA) 180 MG tablet, Take 1 tablet (180 mg total) by mouth daily. For allergy symptoms, Disp: 30 tablet, Rfl: 11   fluticasone (FLONASE) 50 MCG/ACT nasal spray, Place 2 sprays into both nostrils daily as needed., Disp: , Rfl:    gabapentin (NEURONTIN) 300 MG capsule, One each morning and two each evening, Disp: 270 capsule, Rfl: 1   ibuprofen (ADVIL) 800 MG tablet, TAKE 1 TABLET BY MOUTH EVERY 8 HOURS AS NEEDED FOR MODERATE PAIN, Disp: 90 tablet, Rfl: 0   insulin degludec (TRESIBA FLEXTOUCH) 100 UNIT/ML FlexTouch Pen, Inject 35 Units into the skin at bedtime., Disp: , Rfl:    insulin isophane & regular human KwikPen (HUMULIN 70/30 KWIKPEN) (70-30) 100 UNIT/ML KwikPen, Inject 37 Units into the skin with breakfast, and 17 with evening meal. DO NOT inject at lunchtime or any other time, Disp: , Rfl:    Insulin Pen Needle (B-D ULTRAFINE III SHORT PEN) 31G X 8  MM MISC, 1 each by Does not apply route as directed., Disp: 100 each, Rfl: 3   Insulin Syringe-Needle U-100 (B-D INS SYR ULTRAFINE 1CC/30G) 30G X 1/2" 1 ML MISC, Use to inject insulin 3 times daily as directed; DX: E11.65, Disp: 100 each, Rfl: 5   lisinopril (ZESTRIL) 2.5 MG tablet, Take 1 tablet (2.5 mg total) by mouth daily., Disp: 90 tablet, Rfl: 3   metFORMIN (GLUCOPHAGE-XR) 500 MG 24 hr tablet, Take 2 tablets by mouth twice daily, Disp: 360 tablet, Rfl: 0   metoprolol tartrate (LOPRESSOR) 25 MG tablet, Take 0.5 tablets (12.5 mg total) by mouth daily., Disp: 45 tablet, Rfl: 3   metroNIDAZOLE (FLAGYL) 500 MG tablet, Take 1 tablet (500 mg total) by mouth 2 (two) times daily., Disp: 14 tablet, Rfl: 0   nitroGLYCERIN (NITROSTAT) 0.4 MG SL tablet, DISSOLVE ONE TABLET UNDER THE TONGUE EVERY 5 MINUTES AS NEEDED FOR CHEST PAIN.  DO NOT EXCEED A TOTAL OF 3 DOSES IN 15 MINUTES, Disp: 25 tablet, Rfl: 3   omeprazole (PRILOSEC) 40 MG capsule, Take 1 capsule (40 mg total)  by mouth daily., Disp: 90 capsule, Rfl: 3   ondansetron (ZOFRAN) 4 MG tablet, Take 1 tablet (4 mg total) by mouth every 8 (eight) hours as needed for nausea or vomiting., Disp: 20 tablet, Rfl: 0   rosuvastatin (CRESTOR) 10 MG tablet, Take 1 tablet (10 mg total) by mouth daily., Disp: 90 tablet, Rfl: 3   spironolactone (ALDACTONE) 25 MG tablet, Take 1/2 (one-half) tablet by mouth once daily, Disp: 45 tablet, Rfl: 3   tiZANidine (ZANAFLEX) 4 MG tablet, Take 1 tablet (4 mg total) by mouth at bedtime., Disp: 90 tablet, Rfl: 1   traZODone (DESYREL) 50 MG tablet, Take 1 tablet (50 mg total) by mouth at bedtime., Disp: 90 tablet, Rfl: 1   triamterene-hydrochlorothiazide (DYAZIDE) 37.5-25 MG capsule, Take 1 each (1 capsule total) by mouth daily., Disp: 90 capsule, Rfl: 3  Observations/Objective: Patient is well-developed, well-nourished in no acute distress.  Resting comfortably  at home.  Head is normocephalic, atraumatic.  No labored breathing.   Speech is clear and coherent with logical content.  Patient is alert and oriented at baseline.  Right ear pain with pulling at ear lobe   Assessment and Plan: 1. Acute otitis media, unspecified otitis media type - amoxicillin-clavulanate (AUGMENTIN) 875-125 MG tablet; Take 1 tablet by mouth 2 (two) times daily.  Dispense: 14 tablet; Refill: 0  - Take meds as prescribed - Use a cool mist humidifier  -Use saline nose sprays frequently -Force fluids -For any cough or congestion  Use plain Mucinex- regular strength or max strength is fine -For fever or aces or pains- take tylenol or ibuprofen. -Throat lozenges if help -Follow up if symptoms worsen or do not improve   Follow Up Instructions: I discussed the assessment and treatment plan with the patient. The patient was provided an opportunity to ask questions and all were answered. The patient agreed with the plan and demonstrated an understanding of the instructions.  A copy of instructions were sent to the patient via MyChart unless otherwise noted below.     The patient was advised to call back or seek an in-person evaluation if the symptoms worsen or if the condition fails to improve as anticipated.  Time:  I spent 13 minutes with the patient via telehealth technology discussing the above problems/concerns.    Jannifer Rodney, FNP

## 2022-10-01 ENCOUNTER — Telehealth: Payer: Self-pay | Admitting: Family Medicine

## 2022-10-02 NOTE — Telephone Encounter (Signed)
My chart message sent Meds were put in back fridge since patient stated she did not have storage at home and had ample insulin supply

## 2022-10-12 ENCOUNTER — Telehealth: Payer: Self-pay | Admitting: Family Medicine

## 2022-10-18 NOTE — Telephone Encounter (Signed)
Reached out to patient via mychart.

## 2022-10-24 ENCOUNTER — Ambulatory Visit: Payer: Medicare Other | Admitting: Family Medicine

## 2022-10-24 DIAGNOSIS — E1165 Type 2 diabetes mellitus with hyperglycemia: Secondary | ICD-10-CM

## 2022-10-29 ENCOUNTER — Telehealth: Payer: Self-pay | Admitting: Family Medicine

## 2022-10-30 ENCOUNTER — Telehealth: Payer: Self-pay | Admitting: Pharmacist

## 2022-10-30 NOTE — Telephone Encounter (Signed)
Please send me PAPs for patient [name], farxiga, humalog 75/25 She will need my assistance filling out Thank you!

## 2022-11-02 ENCOUNTER — Other Ambulatory Visit (INDEPENDENT_AMBULATORY_CARE_PROVIDER_SITE_OTHER): Payer: Medicare Other | Admitting: Pharmacist

## 2022-11-02 DIAGNOSIS — Z794 Long term (current) use of insulin: Secondary | ICD-10-CM

## 2022-11-02 DIAGNOSIS — E1165 Type 2 diabetes mellitus with hyperglycemia: Secondary | ICD-10-CM

## 2022-11-02 MED ORDER — DAPAGLIFLOZIN PROPANEDIOL 10 MG PO TABS: 10.0000 mg | ORAL_TABLET | Freq: Every day | ORAL | 4 refills | Status: DC

## 2022-11-02 MED ORDER — HUMULIN 70/30 KWIKPEN (70-30) 100 UNIT/ML ~~LOC~~ SUPN: PEN_INJECTOR | SUBCUTANEOUS | 5 refills | Status: DC

## 2022-11-02 NOTE — Progress Notes (Signed)
   Reviewed insulin doses -Humulin 70/30-->37 units mix for breakfast, None for lunch, 17 units mix for supper -Tresiba 35 units  -Farxiga 10mg   Re-enrollment for 2025 patient assistance programs: -Redo lilly cares 70/30 -Redo novo tresiba -Redo az&me Marcelline Deist  Sent new libre to [employer] (advanced diabetes supply) Libre 2 CGM--doesn't qualify for libre 3 yet    Kieth Brightly, PharmD, BCACP, CPP Clinical Pharmacist, [facility]

## 2022-11-02 NOTE — Telephone Encounter (Signed)
Emailed Management consultant and AZ&ME applications for renewal.

## 2022-11-13 DIAGNOSIS — E1165 Type 2 diabetes mellitus with hyperglycemia: Secondary | ICD-10-CM | POA: Diagnosis not present

## 2022-11-13 NOTE — Telephone Encounter (Signed)
Emailed pg 8 of lilly application to you. Both RX's can be e-scribed and I will fax the rest of the application :)

## 2022-11-13 NOTE — Progress Notes (Signed)
Pharmacy Medication Assistance Program Note    11/13/2022  Patient ID: Sara Stuart, female   DOB: 10/31/1955, 67 y.o.   MRN: [id number]     11/13/2022  Outreach Medication One  Initial Outreach Date (Medication One) 10/30/2022  Manufacturer Medication One [employer]  [employer] [facility] Farxiga  Dose of Farxiga 10mg   Type of Radiographer, therapeutic Assistance  Name of Prescriber Mechele Claude  Date Application Submitted to Manufacturer 11/13/2022  Method Application Sent to Manufacturer Fax       Patient ID: Sara Stuart, female  DOB: 1955-01-24, 67 y.o.  MRN:  [id number]     11/13/2022  Outreach Medication Two  Initial Outreach Date (Medication Two) 10/30/2022   10/30/2022  Manufacturer Medication Two Lilly   [facility]  [facility] Humulin  Dose of Humulin 70/30 KWIKPEN  Type of Sport and exercise psychologist  Name of Prescriber WARREN STACKS     Multiple values from one day are sorted in reverse-chronological order       Patient ID: Sara Stuart, female  DOB: 10/08/1955, 67 y.o.  MRN:  [id number]     11/13/2022  Outreach Medication Three  Initial Outreach Date (Medication Three) 10/30/2022  Manufacturer Medication Three Lilly  [facility] [name]  Type of Radiographer, therapeutic Assistance  Name of Prescriber WARREN STACKS       Awaiting completed pcp pages of [employer] application.

## 2022-11-22 ENCOUNTER — Encounter: Payer: Self-pay | Admitting: Family Medicine

## 2022-11-22 ENCOUNTER — Ambulatory Visit: Payer: Medicare Other | Admitting: Family Medicine

## 2022-11-22 VITALS — BP 109/70 | HR 64 | Temp 97.8°F | Ht 61.0 in | Wt 207.4 lb

## 2022-11-22 DIAGNOSIS — Z23 Encounter for immunization: Secondary | ICD-10-CM | POA: Diagnosis not present

## 2022-11-22 DIAGNOSIS — Z794 Long term (current) use of insulin: Secondary | ICD-10-CM | POA: Diagnosis not present

## 2022-11-22 DIAGNOSIS — E782 Mixed hyperlipidemia: Secondary | ICD-10-CM

## 2022-11-22 DIAGNOSIS — E1165 Type 2 diabetes mellitus with hyperglycemia: Secondary | ICD-10-CM | POA: Diagnosis not present

## 2022-11-22 DIAGNOSIS — I10 Essential (primary) hypertension: Secondary | ICD-10-CM | POA: Diagnosis not present

## 2022-11-22 LAB — BAYER DCA HB A1C WAIVED: HB A1C (BAYER DCA - WAIVED): 8.9 % — ABNORMAL HIGH (ref 4.8–5.6)

## 2022-11-22 MED ORDER — TRAZODONE HCL 50 MG PO TABS: 50.0000 mg | ORAL_TABLET | Freq: Every day | ORAL | 3 refills | Status: DC

## 2022-11-22 MED ORDER — DILTIAZEM HCL ER 120 MG PO CP24: 120.0000 mg | ORAL_CAPSULE | Freq: Every day | ORAL | 3 refills | Status: DC

## 2022-11-22 MED ORDER — TRESIBA FLEXTOUCH 100 UNIT/ML ~~LOC~~ SOPN: 40.0000 [IU] | PEN_INJECTOR | Freq: Every day | SUBCUTANEOUS | Status: DC

## 2022-11-22 MED ORDER — METFORMIN HCL ER 500 MG PO TB24: 1000.0000 mg | ORAL_TABLET | Freq: Two times a day (BID) | ORAL | 3 refills | Status: DC

## 2022-11-22 MED ORDER — HUMULIN 70/30 KWIKPEN (70-30) 100 UNIT/ML ~~LOC~~ SUPN: PEN_INJECTOR | SUBCUTANEOUS | 5 refills | Status: DC

## 2022-11-22 NOTE — Progress Notes (Signed)
Subjective:  Patient ID: Sara Stuart, female    DOB: 27-Jan-1955  Age: 67 y.o. MRN: [id number]  CC: Medical Management of Chronic Issues   HPI Sara Stuart presents forFollow-up of diabetes. Not taking care of DM right now due to husband's severe illness. Patient denies symptoms such as polyuria, polydipsia, excessive hunger, nausea No significant hypoglycemic spells noted. Medications reviewed. Pt reports taking them regularly without complication/adverse reaction being reported today.    History Sara Stuart has a past medical history of Arthritis, Chronic back pain, DDD (degenerative disc disease), lumbosacral, Diabetes mellitus without complication (HCC) (diagnosed in 02/2014), GERD (gastroesophageal reflux disease), Gout, Hypertension, Nocturia, Polio, PONV (postoperative nausea and vomiting), and Wears glasses.   She has a past surgical history that includes Foot surgery; surgery for polio; bladder tacked ; Back surgery (07/2012); Tubal ligation; Colonoscopy; Total abdominal hysterectomy (2002); and Shoulder arthroscopy with rotator cuff repair (Right, 06/15/2021).   Her family history includes Cancer in her sister; Colon cancer in her mother; Diabetes in her father; Heart attack in her father; Thyroid disease in her father.She reports that she has never smoked. She has never used smokeless tobacco. She reports that she does not drink alcohol and does not use drugs.  Current Outpatient Medications on File Prior to Visit  Medication Sig Dispense Refill   acetaminophen (TYLENOL) 500 MG tablet Take 1 tablet (500 mg total) by mouth every 6 (six) hours as needed for moderate pain. 30 tablet 0   aspirin EC 81 MG tablet Take 1 tablet (81 mg total) by mouth daily. 30 tablet    Continuous Blood Gluc Receiver (FREESTYLE LIBRE 2 READER) DEVI Use to test blood sugar continuously. DX: E11.65 1 each 0   Continuous Blood Gluc Sensor (FREESTYLE LIBRE 2 SENSOR) MISC Use to test blood sugar continuously.   Replace sensor every 14 days. DX: E11.65 2 each 11   dapagliflozin propanediol (FARXIGA) 10 MG TABS tablet Take 1 tablet (10 mg total) by mouth daily before breakfast. 90 tablet 4   fexofenadine (ALLEGRA) 180 MG tablet Take 1 tablet (180 mg total) by mouth daily. For allergy symptoms 30 tablet 11   fluticasone (FLONASE) 50 MCG/ACT nasal spray Place 2 sprays into both nostrils daily as needed.     gabapentin (NEURONTIN) 300 MG capsule One each morning and two each evening 270 capsule 1   ibuprofen (ADVIL) 800 MG tablet TAKE 1 TABLET BY MOUTH EVERY 8 HOURS AS NEEDED FOR MODERATE PAIN 90 tablet 0   Insulin Pen Needle (B-D ULTRAFINE III SHORT PEN) 31G X 8 MM MISC 1 each by Does not apply route as directed. 100 each 3   Insulin Syringe-Needle U-100 (B-D INS SYR ULTRAFINE 1CC/30G) 30G X 1/2" 1 ML MISC Use to inject insulin 3 times daily as directed; DX: E11.65 100 each 5   lisinopril (ZESTRIL) 2.5 MG tablet Take 1 tablet (2.5 mg total) by mouth daily. 90 tablet 3   metoprolol tartrate (LOPRESSOR) 25 MG tablet Take 0.5 tablets (12.5 mg total) by mouth daily. 45 tablet 3   nitroGLYCERIN (NITROSTAT) 0.4 MG SL tablet DISSOLVE ONE TABLET UNDER THE TONGUE EVERY 5 MINUTES AS NEEDED FOR CHEST PAIN.  DO NOT EXCEED A TOTAL OF 3 DOSES IN 15 MINUTES 25 tablet 3   omeprazole (PRILOSEC) 40 MG capsule Take 1 capsule (40 mg total) by mouth daily. 90 capsule 3   ondansetron (ZOFRAN) 4 MG tablet Take 1 tablet (4 mg total) by mouth every 8 (eight) hours as needed for nausea  or vomiting. 20 tablet 0   rosuvastatin (CRESTOR) 10 MG tablet Take 1 tablet (10 mg total) by mouth daily. 90 tablet 3   spironolactone (ALDACTONE) 25 MG tablet Take 1/2 (one-half) tablet by mouth once daily 45 tablet 3   tiZANidine (ZANAFLEX) 4 MG tablet Take 1 tablet (4 mg total) by mouth at bedtime. 90 tablet 1   triamterene-hydrochlorothiazide (DYAZIDE) 37.5-25 MG capsule Take 1 each (1 capsule total) by mouth daily. 90 capsule 3   No current  facility-administered medications on file prior to visit.    ROS Review of Systems  Constitutional: Negative.   HENT: Negative.    Eyes:  Negative for visual disturbance.  Respiratory:  Negative for shortness of breath.   Cardiovascular:  Negative for chest pain.  Gastrointestinal:  Negative for abdominal pain.  Musculoskeletal:  Negative for arthralgias.    Objective:  BP 109/70   Pulse 64   Temp 97.8 F (36.6 C)   Ht 5\' 1"  (1.549 m)   Wt 207 lb 6.4 oz (94.1 kg)   SpO2 95%   BMI 39.19 kg/m   BP Readings from Last 3 Encounters:  11/22/22 109/70  09/19/22 114/68  08/15/22 102/67    Wt Readings from Last 3 Encounters:  11/22/22 207 lb 6.4 oz (94.1 kg)  09/19/22 205 lb 3.2 oz (93.1 kg)  08/15/22 205 lb 3.2 oz (93.1 kg)     Physical Exam Constitutional:      General: She is not in acute distress.    Appearance: She is well-developed.  Cardiovascular:     Rate and Rhythm: Normal rate and regular rhythm.  Pulmonary:     Breath sounds: Normal breath sounds.  Musculoskeletal:        General: Normal range of motion.  Skin:    General: Skin is warm and dry.  Neurological:     Mental Status: She is alert and oriented to person, place, and time.       Assessment & Plan:   Sara Stuart" was seen today for medical management of chronic issues.  Diagnoses and all orders for this visit:  Uncontrolled type 2 diabetes mellitus with hyperglycemia, with long-term current use of insulin (HCC) -     Bayer DCA Hb A1c Waived -     Microalbumin / creatinine urine ratio -     metFORMIN (GLUCOPHAGE-XR) 500 MG 24 hr tablet; Take 2 tablets (1,000 mg total) by mouth 2 (two) times daily. -     insulin isophane & regular human KwikPen (HUMULIN 70/30 KWIKPEN) (70-30) 100 UNIT/ML KwikPen; Inject 39 Units into the skin with breakfast, and 15 with evening meal. DO NOT inject at lunchtime or any other time. MAX 54 units daily -     insulin degludec (TRESIBA FLEXTOUCH) 100 UNIT/ML  FlexTouch Pen; Inject 40 Units into the skin at bedtime.  Essential hypertension, benign -     CBC with Differential/Platelet -     CMP14+EGFR -     diltiazem (DILT-XR) 120 MG 24 hr capsule; Take 1 capsule (120 mg total) by mouth daily.  Mixed hyperlipidemia -     Lipid panel  Need for influenza vaccination -     Flu Vaccine Trivalent High Dose (Fluad)  Other orders -     traZODone (DESYREL) 50 MG tablet; Take 1 tablet (50 mg total) by mouth at bedtime.      I have discontinued Sara Stuart "[name]"'s metroNIDAZOLE and amoxicillin-clavulanate. I have changed her Dilt-XR to diltiazem. I have also changed  her metFORMIN, HumuLIN 70/30 KwikPen, and [employer]. Additionally, I am having her maintain her aspirin EC, Insulin Pen Needle, fluticasone, FreeStyle Libre 2 Sensor, [employer] 2 Reader, nitroGLYCERIN, acetaminophen, ondansetron, ibuprofen, omeprazole, rosuvastatin, metoprolol tartrate, Insulin Syringe-Needle U-100, gabapentin, lisinopril, spironolactone, triamterene-hydrochlorothiazide, tiZANidine, fexofenadine, dapagliflozin propanediol, and traZODone.  Meds ordered this encounter  Medications   diltiazem (DILT-XR) 120 MG 24 hr capsule    Sig: Take 1 capsule (120 mg total) by mouth daily.    Dispense:  90 capsule    Refill:  3   metFORMIN (GLUCOPHAGE-XR) 500 MG 24 hr tablet    Sig: Take 2 tablets (1,000 mg total) by mouth 2 (two) times daily.    Dispense:  360 tablet    Refill:  3   traZODone (DESYREL) 50 MG tablet    Sig: Take 1 tablet (50 mg total) by mouth at bedtime.    Dispense:  90 tablet    Refill:  3   insulin isophane & regular human KwikPen (HUMULIN 70/30 KWIKPEN) (70-30) 100 UNIT/ML KwikPen    Sig: Inject 39 Units into the skin with breakfast, and 15 with evening meal. DO NOT inject at lunchtime or any other time. MAX 54 units daily    Dispense:  45 mL    Refill:  5    2 boxes=1 month   insulin degludec (TRESIBA FLEXTOUCH) 100 UNIT/ML FlexTouch Pen     Sig: Inject 40 Units into the skin at bedtime.     Follow-up: Return in about 2 weeks (around 12/06/2022).  Mechele Claude, M.D.

## 2022-11-23 ENCOUNTER — Encounter: Payer: Self-pay | Admitting: Family Medicine

## 2022-11-23 LAB — CBC WITH DIFFERENTIAL/PLATELET
Basophils Absolute: 0.1 10*3/uL (ref 0.0–0.2)
Basos: 1 %
EOS (ABSOLUTE): 0.5 10*3/uL — ABNORMAL HIGH (ref 0.0–0.4)
Eos: 5 %
Hematocrit: 44.6 % (ref 34.0–46.6)
Hemoglobin: 13.7 g/dL (ref 11.1–15.9)
Immature Grans (Abs): 0 10*3/uL (ref 0.0–0.1)
Immature Granulocytes: 0 %
Lymphocytes Absolute: 2.5 10*3/uL (ref 0.7–3.1)
Lymphs: 25 %
MCH: 24.8 pg — ABNORMAL LOW (ref 26.6–33.0)
MCHC: 30.7 g/dL — ABNORMAL LOW (ref 31.5–35.7)
MCV: 81 fL (ref 79–97)
Monocytes Absolute: 0.7 10*3/uL (ref 0.1–0.9)
Monocytes: 7 %
Neutrophils Absolute: 6.4 10*3/uL (ref 1.4–7.0)
Neutrophils: 62 %
Platelets: 328 10*3/uL (ref 150–450)
RBC: 5.53 x10E6/uL — ABNORMAL HIGH (ref 3.77–5.28)
RDW: 14.2 % (ref 11.7–15.4)
WBC: 10.3 10*3/uL (ref 3.4–10.8)

## 2022-11-23 LAB — LIPID PANEL
Chol/HDL Ratio: 2.4 ratio (ref 0.0–4.4)
Cholesterol, Total: 134 mg/dL (ref 100–199)
HDL: 55 mg/dL (ref >39)
LDL Chol Calc (NIH): 56 mg/dL (ref 0–99)
Triglycerides: 133 mg/dL (ref 0–149)
VLDL Cholesterol Cal: 23 mg/dL (ref 5–40)

## 2022-11-23 LAB — CMP14+EGFR
ALT: 20 [IU]/L (ref 0–32)
AST: 20 [IU]/L (ref 0–40)
Albumin: 4.4 g/dL (ref 3.9–4.9)
Alkaline Phosphatase: 99 [IU]/L (ref 44–121)
BUN/Creatinine Ratio: 20 (ref 12–28)
BUN: 20 mg/dL (ref 8–27)
Bilirubin Total: 0.4 mg/dL (ref 0.0–1.2)
CO2: 21 mmol/L (ref 20–29)
Calcium: 9.5 mg/dL (ref 8.7–10.3)
Chloride: 100 mmol/L (ref 96–106)
Creatinine, Ser: 0.99 mg/dL (ref 0.57–1.00)
Globulin, Total: 2.8 g/dL (ref 1.5–4.5)
Glucose: 72 mg/dL (ref 70–99)
Potassium: 4.1 mmol/L (ref 3.5–5.2)
Sodium: 139 mmol/L (ref 134–144)
Total Protein: 7.2 g/dL (ref 6.0–8.5)
eGFR: 63 mL/min/{1.73_m2} (ref >59)

## 2022-11-23 LAB — MICROALBUMIN / CREATININE URINE RATIO
Creatinine, Urine: 80.6 mg/dL
Microalb/Creat Ratio: <4 mg/g{creat} (ref 0–29)
Microalbumin, Urine: <3 ug/mL

## 2022-11-23 NOTE — Progress Notes (Signed)
Hello [name],  Your lab result is normal and/or stable.Some minor variations that are not significant are commonly marked abnormal, but do not represent any medical problem for you.  Best regards, Mechele Claude, M.D.

## 2022-12-10 NOTE — Progress Notes (Signed)
Pharmacy Medication Assistance Program Note    12/10/2022  Patient ID: Sara Stuart, female   DOB: December 20, 1955, 67 y.o.   MRN: [id number]     11/13/2022  Outreach Medication One  Initial Outreach Date (Medication One) 10/30/2022  Manufacturer Medication One A[employer]  [employer] [facility] Farxiga  Dose of Farxiga 10mg   Type of Radiographer, therapeutic Assistance  Name of Prescriber Broadus John Stacks  Date Application Submitted to Manufacturer 11/13/2022  Method Application Sent to Manufacturer Fax  Patient Assistance Determination Approved  Approval End Date 01/01/2024

## 2022-12-18 NOTE — Telephone Encounter (Addendum)
Submitted RE-ENROLLMENT application for [facility] & HUMULIN to LILLY CARES for patient assistance.   Phone: [phone]  Please send new Rxs for both meds to [facility]. Thanks!

## 2022-12-19 ENCOUNTER — Telehealth: Payer: Self-pay | Admitting: Pharmacist

## 2022-12-19 DIAGNOSIS — E1165 Type 2 diabetes mellitus with hyperglycemia: Secondary | ICD-10-CM

## 2022-12-19 MED ORDER — HUMULIN 70/30 KWIKPEN (70-30) 100 UNIT/ML ~~LOC~~ SUPN: PEN_INJECTOR | SUBCUTANEOUS | 5 refills | Status: DC

## 2022-12-19 NOTE — Telephone Encounter (Signed)
Refills for Humulin for lilly cares patient assistance escribed to [employer]

## 2022-12-20 ENCOUNTER — Ambulatory Visit: Payer: Medicare Other

## 2022-12-20 VITALS — Ht 61.0 in | Wt 207.0 lb

## 2022-12-20 DIAGNOSIS — Z0000 Encounter for general adult medical examination without abnormal findings: Secondary | ICD-10-CM | POA: Diagnosis not present

## 2022-12-20 NOTE — Patient Instructions (Signed)
Sara Stuart , Thank you for taking time to come for your Medicare Wellness Visit. I appreciate your ongoing commitment to your health goals. Please review the following plan we discussed and let me know if I can assist you in the future.   Referrals/Orders/Follow-Ups/Clinician Recommendations: Aim for 30 minutes of exercise or brisk walking, 6-8 glasses of water, and 5 servings of fruits and vegetables each day.  This is a list of the screening recommended for you and due dates:  Health Maintenance  Topic Date Due   COVID-19 Vaccine (4 - 2024-25 season) 09/02/2022   Eye exam for diabetics  04/24/2023   Complete foot exam   05/14/2023   Hemoglobin A1C  05/22/2023   Yearly kidney function blood test for diabetes  11/22/2023   Yearly kidney health urinalysis for diabetes  11/22/2023   Medicare Annual Wellness Visit  12/20/2023   Mammogram  03/04/2024   Colon Cancer Screening  03/25/2024   DEXA scan (bone density measurement)  08/12/2026   DTaP/Tdap/Td vaccine (2 - Td or Tdap) 01/08/2028   Pneumonia Vaccine  Completed   Flu Shot  Completed   Hepatitis C Screening  Completed   Zoster (Shingles) Vaccine  Completed   HPV Vaccine  Aged Out    Advanced directives: (ACP Link)Information on Advanced Care Planning can be found at [facility] [place] Advance Health Care Directives Advance Health Care Directives ([email or web address]   Next Medicare Annual Wellness Visit scheduled for next year: Yes

## 2022-12-20 NOTE — Progress Notes (Signed)
Subjective:   Sara Stuart is a 67 y.o. female who presents for Medicare Annual (Subsequent) preventive examination.  Visit Complete: Virtual I connected with  Mallie Mussel on 12/20/22 by a audio enabled telemedicine application and verified that I am speaking with the correct person using two identifiers.  Patient Location: Home  Provider Location: Office/Clinic  I discussed the limitations of evaluation and management by telemedicine. The patient expressed understanding and agreed to proceed.  Vital Signs: Because this visit was a virtual/telehealth visit, some criteria may be missing or patient reported. Any vitals not documented were not able to be obtained and vitals that have been documented are patient reported.  Cardiac Risk Factors include: advanced age (>69men, >47 women);diabetes mellitus;dyslipidemia;hypertension     Objective:    Today's Vitals   12/20/22 1026  Weight: 207 lb (93.9 kg)  Height: 5\' 1"  (1.549 m)   Body mass index is 39.11 kg/m.     12/20/2022   12:04 PM 12/12/2021   11:03 AM 09/25/2021    2:56 PM 06/29/2021    2:01 PM 06/12/2021    1:35 PM 06/15/2019   10:15 AM 06/04/2018    9:00 PM  Advanced Directives  Does Patient Have a Medical Advance Directive? No No No No No No No  Would patient like information on creating a medical advance directive? Yes (MAU/Ambulatory/Procedural Areas - Information given) Yes (ED - Information included in AVS) No - Patient declined  No - Patient declined  Yes (Inpatient - patient defers creating a medical advance directive at this time - Information given)    Current Medications (verified) Outpatient Encounter Medications as of 12/20/2022  Medication Sig   acetaminophen (TYLENOL) 500 MG tablet Take 1 tablet (500 mg total) by mouth every 6 (six) hours as needed for moderate pain.   aspirin EC 81 MG tablet Take 1 tablet (81 mg total) by mouth daily.   Continuous Blood Gluc Receiver (FREESTYLE LIBRE 2 READER) DEVI Use to  test blood sugar continuously. DX: E11.65   Continuous Blood Gluc Sensor (FREESTYLE LIBRE 2 SENSOR) MISC Use to test blood sugar continuously.  Replace sensor every 14 days. DX: E11.65   dapagliflozin propanediol (FARXIGA) 10 MG TABS tablet Take 1 tablet (10 mg total) by mouth daily before breakfast.   diltiazem (DILT-XR) 120 MG 24 hr capsule Take 1 capsule (120 mg total) by mouth daily.   fexofenadine (ALLEGRA) 180 MG tablet Take 1 tablet (180 mg total) by mouth daily. For allergy symptoms   fluticasone (FLONASE) 50 MCG/ACT nasal spray Place 2 sprays into both nostrils daily as needed.   gabapentin (NEURONTIN) 300 MG capsule One each morning and two each evening   ibuprofen (ADVIL) 800 MG tablet TAKE 1 TABLET BY MOUTH EVERY 8 HOURS AS NEEDED FOR MODERATE PAIN   insulin degludec (TRESIBA FLEXTOUCH) 100 UNIT/ML FlexTouch Pen Inject 40 Units into the skin at bedtime.   insulin isophane & regular human KwikPen (HUMULIN 70/30 KWIKPEN) (70-30) 100 UNIT/ML KwikPen Inject 39 Units into the skin with breakfast, and 15 with evening meal. DO NOT inject at lunchtime or any other time. MAX 54 units daily   Insulin Pen Needle (B-D ULTRAFINE III SHORT PEN) 31G X 8 MM MISC 1 each by Does not apply route as directed.   Insulin Syringe-Needle U-100 (B-D INS SYR ULTRAFINE 1CC/30G) 30G X 1/2" 1 ML MISC Use to inject insulin 3 times daily as directed; DX: E11.65   lisinopril (ZESTRIL) 2.5 MG tablet Take 1 tablet (2.5 mg  total) by mouth daily.   metFORMIN (GLUCOPHAGE-XR) 500 MG 24 hr tablet Take 2 tablets (1,000 mg total) by mouth 2 (two) times daily.   metoprolol tartrate (LOPRESSOR) 25 MG tablet Take 0.5 tablets (12.5 mg total) by mouth daily.   nitroGLYCERIN (NITROSTAT) 0.4 MG SL tablet DISSOLVE ONE TABLET UNDER THE TONGUE EVERY 5 MINUTES AS NEEDED FOR CHEST PAIN.  DO NOT EXCEED A TOTAL OF 3 DOSES IN 15 MINUTES   omeprazole (PRILOSEC) 40 MG capsule Take 1 capsule (40 mg total) by mouth daily.   ondansetron (ZOFRAN) 4  MG tablet Take 1 tablet (4 mg total) by mouth every 8 (eight) hours as needed for nausea or vomiting.   rosuvastatin (CRESTOR) 10 MG tablet Take 1 tablet (10 mg total) by mouth daily.   spironolactone (ALDACTONE) 25 MG tablet Take 1/2 (one-half) tablet by mouth once daily   tiZANidine (ZANAFLEX) 4 MG tablet Take 1 tablet (4 mg total) by mouth at bedtime.   traZODone (DESYREL) 50 MG tablet Take 1 tablet (50 mg total) by mouth at bedtime.   triamterene-hydrochlorothiazide (DYAZIDE) 37.5-25 MG capsule Take 1 each (1 capsule total) by mouth daily.   No facility-administered encounter medications on file as of 12/20/2022.    Allergies (verified) Trulicity [dulaglutide]   History: Past Medical History:  Diagnosis Date   Arthritis    Chronic back pain    spondylolishthesis   DDD (degenerative disc disease), lumbosacral    Diabetes mellitus without complication (HCC) diagnosed in 02/2014   takes Metformin and Glipizide daily   GERD (gastroesophageal reflux disease)    takes Omeprazole daily   Gout    takes Colchicine daily as needed   Hypertension    takes Dyazide and Diltiazem daily   Nocturia    Polio    PONV (postoperative nausea and vomiting)    Wears glasses    Past Surgical History:  Procedure Laterality Date   BACK SURGERY  07/2012   bladder tacked      COLONOSCOPY     FOOT SURGERY     more than 5 surgeries on right foot related to polio   SHOULDER ARTHROSCOPY WITH ROTATOR CUFF REPAIR Right 06/15/2021   Procedure: SHOULDER ARTHROSCOPY WITH ROTATOR CUFF REPAIR;  Surgeon: Oliver Barre, MD;  Location: [facility];  Service: Orthopedics;  Laterality: Right;   surgery for polio     states x 5    TOTAL ABDOMINAL HYSTERECTOMY  2002   TUBAL PREGNANCY   TUBAL LIGATION     Family History  Problem Relation Age of Onset   Colon cancer Mother    Diabetes Father    Thyroid disease Father    Heart attack Father    Cancer Sister        lung   Breast cancer Neg Hx    Social History    Socioeconomic History   Marital status: Married    Spouse name: Merlyn Albert   Number of children: 3   Years of education: Not on file   Highest education level: 9th grade  Occupational History   Occupation: Retired  Tobacco Use   Smoking status: Never   Smokeless tobacco: Never  Vaping Use   Vaping status: Never Used  Substance and Sexual Activity   Alcohol use: No   Drug use: No   Sexual activity: Yes    Birth control/protection: Surgical  Other Topics Concern   Not on file  Social History Narrative   Retired and lives at home with husband Merlyn Albert.  Has 3 grown children and 1 deceased child. Likes to sew and clean. Right handed. Reports to caffeine drinks per day.    Social Drivers of Corporate investment banker Strain: Low Risk  (12/20/2022)   Overall Financial Resource Strain (CARDIA)    Difficulty of Paying Living Expenses: Not hard at all  Food Insecurity: No Food Insecurity (12/20/2022)   Hunger Vital Sign    Worried About Running Out of Food in the Last Year: Never true    Ran Out of Food in the Last Year: Never true  Transportation Needs: No Transportation Needs (12/20/2022)   PRAPARE - Administrator, Civil Service (Medical): No    Lack of Transportation (Non-Medical): No  Physical Activity: Insufficiently Active (12/20/2022)   Exercise Vital Sign    Days of Exercise per Week: 3 days    Minutes of Exercise per Session: 30 min  Stress: No Stress Concern Present (12/20/2022)   [employer] of Occupational Health - Occupational Stress Questionnaire    Feeling of Stress : Not at all  Social Connections: Moderately Integrated (12/20/2022)   Social Connection and Isolation Panel [NHANES]    Frequency of Communication with Friends and Family: More than three times a week    Frequency of Social Gatherings with Friends and Family: Three times a week    Attends Religious Services: More than 4 times per year    Active Member of Clubs or Organizations: No     Attends Banker Meetings: Never    Marital Status: Married    Tobacco Counseling Counseling given: Not Answered   Clinical Intake:  Pre-visit preparation completed: Yes  Pain : No/denies pain     Diabetes: No  How often do you need to have someone help you when you read instructions, pamphlets, or other written materials from your doctor or pharmacy?: 1 - Never  Interpreter Needed?: No  Information entered by :: Kandis Fantasia LPN   Activities of Daily Living    12/20/2022   12:04 PM  In your present state of health, do you have any difficulty performing the following activities:  Hearing? 0  Vision? 0  Difficulty concentrating or making decisions? 0  Walking or climbing stairs? 0  Dressing or bathing? 0  Doing errands, shopping? 0  Preparing Food and eating ? N  Using the Toilet? N  In the past six months, have you accidently leaked urine? N  Do you have problems with loss of bowel control? N  Managing your Medications? N  Managing your Finances? N  Housekeeping or managing your Housekeeping? N    Patient Care Team: Mechele Claude, MD as PCP - General (Family Medicine) Danella Maiers, [facility] as Pharmacist (Family Medicine) Delora Fuel, OD (Optometry)  Indicate any recent Medical Services you may have received from other than [facility] providers in the past year (date may be approximate).     Assessment:   This is a routine wellness examination for Chaselynn.  Hearing/Vision screen Hearing Screening - Comments:: Denies hearing difficulties   Vision Screening - Comments:: Wears rx glasses - up to date with routine eye exams with [facility]     Goals Addressed             This Visit's Progress    Remain active and independent        Depression Screen    12/20/2022   12:03 PM 11/22/2022   10:32 AM 11/22/2022   10:31 AM 09/19/2022   10:26  AM 08/15/2022   10:39 AM 05/14/2022    1:44 PM 03/22/2022    3:08 PM  PHQ 2/9 Scores  PHQ - 2  Score 0 0 0 0 0 0 0  PHQ- 9 Score 4 5         Fall Risk    12/20/2022   12:04 PM 11/22/2022   10:32 AM 11/22/2022   10:31 AM 09/19/2022   10:26 AM 08/15/2022   10:39 AM  Fall Risk   Falls in the past year? 0 0 0 0 0  Number falls in past yr: 0      Injury with Fall? 0      Risk for fall due to : No Fall Risks      Follow up Falls prevention discussed;Education provided;Falls evaluation completed        MEDICARE RISK AT HOME: Medicare Risk at Home Any stairs in or around the home?: No If so, are there any without handrails?: No Home free of loose throw rugs in walkways, pet beds, electrical cords, etc?: Yes Adequate lighting in your home to reduce risk of falls?: Yes Life alert?: No Use of a cane, walker or w/c?: No Grab bars in the bathroom?: Yes Shower chair or bench in shower?: No Elevated toilet seat or a handicapped toilet?: Yes  TIMED UP AND GO:  Was the test performed?  No    Cognitive Function:        12/20/2022   12:04 PM 12/12/2021   11:06 AM  6CIT Screen  What Year? 0 points 0 points  What month? 0 points 0 points  What time? 0 points 0 points  Count back from 20 0 points 0 points  Months in reverse 0 points 2 points  Repeat phrase 2 points 2 points  Total Score 2 points 4 points    Immunizations Immunization History  Administered Date(s) Administered   Fluad Trivalent(High Dose 65+) 11/22/2022   Influenza, High Dose Seasonal PF 10/22/2020   Influenza, Quadrivalent, Recombinant, Inj, Pf 09/18/2018   Influenza,inj,Quad PF,6+ Mos 09/21/2017   Influenza,inj,quad, With Preservative 01/05/2017, 09/21/2017   Influenza,trivalent, recombinat, inj, PF 10/02/2014   Influenza-Unspecified 09/18/2018, 10/02/2019, 10/27/2020   Moderna Sars-Covid-2 Vaccination 11/03/2019, 12/03/2019, 06/16/2020   PNEUMOCOCCAL CONJUGATE-20 11/01/2020   Pneumococcal Polysaccharide-23 09/10/2017   Tdap 01/07/2018   Zoster Recombinant(Shingrix) 02/09/2021, 05/16/2022    TDAP  status: Up to date  Flu Vaccine status: Up to date  Pneumococcal vaccine status: Up to date  Covid-19 vaccine status: Information provided on how to obtain vaccines.   Qualifies for Shingles Vaccine? Yes   Zostavax completed No   Shingrix Completed?: Yes  Screening Tests Health Maintenance  Topic Date Due   COVID-19 Vaccine (4 - 2024-25 season) 09/02/2022   OPHTHALMOLOGY EXAM  04/24/2023   FOOT EXAM  05/14/2023   HEMOGLOBIN A1C  05/22/2023   Diabetic kidney evaluation - eGFR measurement  11/22/2023   Diabetic kidney evaluation - Urine ACR  11/22/2023   Medicare Annual Wellness (AWV)  12/20/2023   MAMMOGRAM  03/04/2024   Colonoscopy  03/25/2024   DEXA SCAN  08/12/2026   DTaP/Tdap/Td (2 - Td or Tdap) 01/08/2028   Pneumonia Vaccine 35+ Years old  Completed   INFLUENZA VACCINE  Completed   Hepatitis C Screening  Completed   Zoster Vaccines- Shingrix  Completed   HPV VACCINES  Aged Out    Health Maintenance  Health Maintenance Due  Topic Date Due   COVID-19 Vaccine (4 - 2024-25 season) 09/02/2022  Colorectal cancer screening: Type of screening: Colonoscopy. Completed 03/26/19. Repeat every 5 years  Mammogram status: Completed 03/05/22. Repeat every year  Bone Density status: Completed 08/11/21. Results reflect: Bone density results: NORMAL. Repeat every 5 years.  Lung Cancer Screening: (Low Dose CT Chest recommended if Age 63-80 years, 20 pack-year currently smoking OR have quit w/in 15years.) does not qualify.   Lung Cancer Screening Referral: n/a  Additional Screening:  Hepatitis C Screening: does qualify; Completed 11/11/17  Vision Screening: Recommended annual ophthalmology exams for early detection of glaucoma and other disorders of the eye. Is the patient up to date with their annual eye exam?  Yes  Who is the provider or what is the name of the office in which the patient attends annual eye exams? [facility]  If pt is not established with a provider,  would they like to be referred to a provider to establish care? No .   Dental Screening: Recommended annual dental exams for proper oral hygiene  Diabetic Foot Exam: Diabetic Foot Exam: Completed 05/14/22  Community Resource Referral / Chronic Care Management: CRR required this visit?  No   CCM required this visit?  No     Plan:     I have personally reviewed and noted the following in the patient's chart:   Medical and social history Use of alcohol, tobacco or illicit drugs  Current medications and supplements including opioid prescriptions. Patient is not currently taking opioid prescriptions. Functional ability and status Nutritional status Physical activity Advanced directives List of other physicians Hospitalizations, surgeries, and ER visits in previous 12 months Vitals Screenings to include cognitive, depression, and falls Referrals and appointments  In addition, I have reviewed and discussed with patient certain preventive protocols, quality metrics, and best practice recommendations. A written personalized care plan for preventive services as well as general preventive health recommendations were provided to patient.     Kandis Fantasia [place], [place]   [id number]   After Visit Summary: (MyChart) Due to this being a telephonic visit, the after visit summary with patients personalized plan was offered to patient via MyChart   Nurse Notes: No concerns at this time

## 2022-12-24 ENCOUNTER — Ambulatory Visit: Payer: Medicare Other | Admitting: Family Medicine

## 2022-12-24 NOTE — Telephone Encounter (Signed)
Humulin 70/30 kwikpen e-scribed to [facility] 12/19/22.  Will send novo nordisk enrollment info for [place] assistance.

## 2022-12-31 ENCOUNTER — Other Ambulatory Visit (HOSPITAL_COMMUNITY): Payer: Self-pay

## 2022-12-31 ENCOUNTER — Telehealth: Payer: Self-pay

## 2023-01-01 NOTE — Telephone Encounter (Signed)
 PCP novo nordisk PAP portion has been esigned

## 2023-01-08 NOTE — Progress Notes (Signed)
 Pharmacy Medication Assistance Program Note    01/08/2023  Patient ID: Mianna Iezzi, female  DOB: 10/29/1955, 68 y.o.  MRN:  [id number]     11/13/2022  Outreach Medication Two  Initial Outreach Date (Medication Two) 10/30/2022   10/30/2022  Manufacturer Medication Two Lilly   Lilly  [facility] Humulin   Dose of Humulin  70/30 KWIKPEN  Type of Assistance Manufacturer Assistance  Name of Prescriber WARREN STACKS  Date Application Received From Patient 12/14/2022  Method Application Sent to Manufacturer Fax  Date Application Submitted to Manufacturer 01/08/2023     Multiple values from one day are sorted in reverse-chronological order     Refaxed 01/08/23

## 2023-01-11 NOTE — Progress Notes (Signed)
 Pharmacy Medication Assistance Program Note    01/11/2023  Patient ID: Sara Stuart, female  DOB: [birth year], 68 y.o.  MRN:  [id number]     12/31/2022  Outreach Medication Four  Manufacturer Medication Four Novo Nordisk  Nordisk Drugs Tresiba   Type of Radiographer, Therapeutic Assistance  Date Application Submitted to Manufacturer 12/31/2022  Method Application Sent to Manufacturer Online  Patient Assistance Determination Approved  Approval Start Date 01/03/2023  Approval End Date 01/01/2024

## 2023-01-17 ENCOUNTER — Ambulatory Visit (INDEPENDENT_AMBULATORY_CARE_PROVIDER_SITE_OTHER): Payer: Medicare Other | Admitting: Family Medicine

## 2023-01-17 ENCOUNTER — Encounter: Payer: Self-pay | Admitting: Family Medicine

## 2023-01-17 DIAGNOSIS — E1165 Type 2 diabetes mellitus with hyperglycemia: Secondary | ICD-10-CM | POA: Diagnosis not present

## 2023-01-17 DIAGNOSIS — Z794 Long term (current) use of insulin: Secondary | ICD-10-CM

## 2023-01-17 NOTE — Progress Notes (Addendum)
Subjective:  Patient ID: Sara Stuart, female    DOB: [birth year]  Age: 68 y.o. MRN: [id number]  CC: Follow-up   HPI Sara Stuart presents forFollow-up of diabetes. Patient returned log. Reviewed with her.  atient denies symptoms such as polyuria, polydipsia, excessive hunger, nausea No significant hypoglycemic spells noted. Medications reviewed. Pt reports taking them regularly without complication/adverse reaction being reported today.    History Sara Stuart has a past medical history of Arthritis, Chronic back pain, DDD (degenerative disc disease), lumbosacral, Diabetes mellitus without complication (HCC) (diagnosed in 02/2014), GERD (gastroesophageal reflux disease), Gout, Hypertension, Nocturia, Polio, PONV (postoperative nausea and vomiting), and Wears glasses.   She has a past surgical history that includes Foot surgery; surgery for polio; bladder tacked ; Back surgery (07/2012); Tubal ligation; Colonoscopy; Total abdominal hysterectomy (2002); and Shoulder arthroscopy with rotator cuff repair (Right, 06/15/2021).   Her family history includes Cancer in her sister; Colon cancer in her mother; Diabetes in her father; Heart attack in her father; Thyroid disease in her father.She reports that she has never smoked. She has never used smokeless tobacco. She reports that she does not drink alcohol and does not use drugs.  Current Outpatient Medications on File Prior to Visit  Medication Sig Dispense Refill   acetaminophen (TYLENOL) 500 MG tablet Take 1 tablet (500 mg total) by mouth every 6 (six) hours as needed for moderate pain. 30 tablet 0   aspirin EC 81 MG tablet Take 1 tablet (81 mg total) by mouth daily. 30 tablet    Continuous Blood Gluc Receiver (FREESTYLE LIBRE 2 READER) DEVI Use to test blood sugar continuously. DX: E11.65 1 each 0   Continuous Blood Gluc Sensor (FREESTYLE LIBRE 2 SENSOR) MISC Use to test blood sugar continuously.  Replace sensor every 14 days. DX: E11.65 2 each 11    dapagliflozin propanediol (FARXIGA) 10 MG TABS tablet Take 1 tablet (10 mg total) by mouth daily before breakfast. 90 tablet 4   diltiazem (DILT-XR) 120 MG 24 hr capsule Take 1 capsule (120 mg total) by mouth daily. 90 capsule 3   fexofenadine (ALLEGRA) 180 MG tablet Take 1 tablet (180 mg total) by mouth daily. For allergy symptoms 30 tablet 11   insulin isophane & regular human KwikPen (HUMULIN 70/30 KWIKPEN) (70-30) 100 UNIT/ML KwikPen Inject 39 Units into the skin with breakfast, and 15 with evening meal. DO NOT inject at lunchtime or any other time. MAX 54 units daily 45 mL 5   Insulin Pen Needle (B-D ULTRAFINE III SHORT PEN) 31G X 8 MM MISC 1 each by Does not apply route as directed. 100 each 3   Insulin Syringe-Needle U-100 (B-D INS SYR ULTRAFINE 1CC/30G) 30G X 1/2" 1 ML MISC Use to inject insulin 3 times daily as directed; DX: E11.65 100 each 5   lisinopril (ZESTRIL) 2.5 MG tablet Take 1 tablet (2.5 mg total) by mouth daily. 90 tablet 3   metFORMIN (GLUCOPHAGE-XR) 500 MG 24 hr tablet Take 2 tablets (1,000 mg total) by mouth 2 (two) times daily. 360 tablet 3   metoprolol tartrate (LOPRESSOR) 25 MG tablet Take 0.5 tablets (12.5 mg total) by mouth daily. 45 tablet 3   nitroGLYCERIN (NITROSTAT) 0.4 MG SL tablet DISSOLVE ONE TABLET UNDER THE TONGUE EVERY 5 MINUTES AS NEEDED FOR CHEST PAIN.  DO NOT EXCEED A TOTAL OF 3 DOSES IN 15 MINUTES 25 tablet 3   omeprazole (PRILOSEC) 40 MG capsule Take 1 capsule (40 mg total) by mouth daily. 90 capsule 3  ondansetron (ZOFRAN) 4 MG tablet Take 1 tablet (4 mg total) by mouth every 8 (eight) hours as needed for nausea or vomiting. 20 tablet 0   rosuvastatin (CRESTOR) 10 MG tablet Take 1 tablet (10 mg total) by mouth daily. 90 tablet 3   spironolactone (ALDACTONE) 25 MG tablet Take 1/2 (one-half) tablet by mouth once daily 45 tablet 3   tiZANidine (ZANAFLEX) 4 MG tablet Take 1 tablet (4 mg total) by mouth at bedtime. 90 tablet 1   traZODone (DESYREL) 50 MG tablet  Take 1 tablet (50 mg total) by mouth at bedtime. 90 tablet 3   triamterene-hydrochlorothiazide (DYAZIDE) 37.5-25 MG capsule Take 1 each (1 capsule total) by mouth daily. 90 capsule 3   No current facility-administered medications on file prior to visit.    ROS Review of Systems  Constitutional: Negative.   HENT: Negative.    Eyes:  Negative for visual disturbance.  Respiratory:  Negative for shortness of breath.   Cardiovascular:  Negative for chest pain.  Gastrointestinal:  Negative for abdominal pain.  Musculoskeletal:  Negative for arthralgias.    Objective:  BP 103/63   Pulse 61   Temp (!) 97.4 F (36.3 C) (Temporal)   Ht 5\' 1"  (1.549 m)   Wt 210 lb (95.3 kg)   SpO2 92%   BMI 39.68 kg/m   BP Readings from Last 3 Encounters:  01/17/23 103/63  11/22/22 109/70  09/19/22 114/68    Wt Readings from Last 3 Encounters:  01/17/23 210 lb (95.3 kg)  12/20/22 207 lb (93.9 kg)  11/22/22 207 lb 6.4 oz (94.1 kg)     Physical Exam Constitutional:      General: She is not in acute distress.    Appearance: She is well-developed.  Cardiovascular:     Rate and Rhythm: Normal rate and regular rhythm.  Pulmonary:     Breath sounds: Normal breath sounds.  Musculoskeletal:        General: Normal range of motion.  Skin:    General: Skin is warm and dry.  Neurological:     Mental Status: She is alert and oriented to person, place, and time.       Assessment & Plan:   Sara Stuart" was seen today for follow-up.  Diagnoses and all orders for this visit:  Uncontrolled type 2 diabetes mellitus with hyperglycemia, with long-term current use of insulin (HCC) -     insulin degludec (TRESIBA FLEXTOUCH) 100 UNIT/ML FlexTouch Pen; Inject 50 Units into the skin at bedtime.  Other orders -     gabapentin (NEURONTIN) 300 MG capsule; One each morning and two each evening -     ibuprofen (ADVIL) 800 MG tablet; Take 1 tablet (800 mg total) by mouth 3 (three) times daily. -      fluticasone (FLONASE) 50 MCG/ACT nasal spray; Place 2 sprays into both nostrils daily as needed.      I have changed Sara Stuart "Sara Stuart"'s ibuprofen and Tresiba FlexTouch. I am also having her maintain her aspirin EC, Insulin Pen Needle, FreeStyle Libre 2 Sensor, [employer] 2 Reader, nitroGLYCERIN, acetaminophen, ondansetron, omeprazole, rosuvastatin, metoprolol tartrate, Insulin Syringe-Needle U-100, lisinopril, spironolactone, triamterene-hydrochlorothiazide, tiZANidine, fexofenadine, dapagliflozin propanediol, diltiazem, metFORMIN, traZODone, HumuLIN 70/30 KwikPen, gabapentin, and fluticasone.  Meds ordered this encounter  Medications   gabapentin (NEURONTIN) 300 MG capsule    Sig: One each morning and two each evening    Dispense:  270 capsule    Refill:  3   ibuprofen (ADVIL) 800 MG tablet  Sig: Take 1 tablet (800 mg total) by mouth 3 (three) times daily.    Dispense:  90 tablet    Refill:  0   fluticasone (FLONASE) 50 MCG/ACT nasal spray    Sig: Place 2 sprays into both nostrils daily as needed.    Dispense:  18.2 mL    Refill:  11   insulin degludec (TRESIBA FLEXTOUCH) 100 UNIT/ML FlexTouch Pen    Sig: Inject 50 Units into the skin at bedtime.    Dispense:  15 mL    Refill:  2     Follow-up: Return in about 3 months (around 04/17/2023).  Mechele Claude, M.D.

## 2023-01-18 ENCOUNTER — Encounter: Payer: Self-pay | Admitting: Family Medicine

## 2023-01-18 MED ORDER — FLUTICASONE PROPIONATE 50 MCG/ACT NA SUSP: 2.0000 | Freq: Every day | NASAL | 11 refills | Status: AC | PRN

## 2023-01-18 MED ORDER — GABAPENTIN 300 MG PO CAPS: ORAL_CAPSULE | ORAL | 3 refills | Status: AC

## 2023-01-18 MED ORDER — IBUPROFEN 800 MG PO TABS: 800.0000 mg | ORAL_TABLET | Freq: Three times a day (TID) | ORAL | 0 refills | Status: DC

## 2023-01-18 MED ORDER — TRESIBA FLEXTOUCH 100 UNIT/ML ~~LOC~~ SOPN: 50.0000 [IU] | PEN_INJECTOR | Freq: Every day | SUBCUTANEOUS | 2 refills | Status: DC

## 2023-01-25 ENCOUNTER — Emergency Department (HOSPITAL_COMMUNITY): Payer: Medicare Other

## 2023-01-25 ENCOUNTER — Emergency Department (HOSPITAL_COMMUNITY)
Admission: EM | Admit: 2023-01-25 | Discharge: 2023-01-26 | Disposition: A | Discharge status: Home / Self Care | Payer: Medicare Other | Attending: Emergency Medicine | Admitting: Emergency Medicine

## 2023-01-25 ENCOUNTER — Encounter (HOSPITAL_COMMUNITY): Payer: Self-pay | Admitting: Emergency Medicine

## 2023-01-25 ENCOUNTER — Other Ambulatory Visit: Payer: Self-pay

## 2023-01-25 DIAGNOSIS — M25531 Pain in right wrist: Secondary | ICD-10-CM | POA: Diagnosis not present

## 2023-01-25 DIAGNOSIS — E119 Type 2 diabetes mellitus without complications: Secondary | ICD-10-CM | POA: Diagnosis not present

## 2023-01-25 DIAGNOSIS — W01198A Fall on same level from slipping, tripping and stumbling with subsequent striking against other object, initial encounter: Secondary | ICD-10-CM | POA: Insufficient documentation

## 2023-01-25 DIAGNOSIS — W19XXXA Unspecified fall, initial encounter: Secondary | ICD-10-CM | POA: Diagnosis not present

## 2023-01-25 DIAGNOSIS — S43016A Anterior dislocation of unspecified humerus, initial encounter: Secondary | ICD-10-CM

## 2023-01-25 DIAGNOSIS — Y93G3 Activity, cooking and baking: Secondary | ICD-10-CM | POA: Diagnosis not present

## 2023-01-25 DIAGNOSIS — Z9889 Other specified postprocedural states: Secondary | ICD-10-CM | POA: Diagnosis not present

## 2023-01-25 DIAGNOSIS — S43004A Unspecified dislocation of right shoulder joint, initial encounter: Secondary | ICD-10-CM

## 2023-01-25 DIAGNOSIS — Z7982 Long term (current) use of aspirin: Secondary | ICD-10-CM | POA: Insufficient documentation

## 2023-01-25 DIAGNOSIS — S0083XA Contusion of other part of head, initial encounter: Secondary | ICD-10-CM | POA: Insufficient documentation

## 2023-01-25 DIAGNOSIS — Z794 Long term (current) use of insulin: Secondary | ICD-10-CM | POA: Insufficient documentation

## 2023-01-25 DIAGNOSIS — Y92000 Kitchen of unspecified non-institutional (private) residence as  the place of occurrence of the external cause: Secondary | ICD-10-CM | POA: Insufficient documentation

## 2023-01-25 DIAGNOSIS — Z043 Encounter for examination and observation following other accident: Secondary | ICD-10-CM | POA: Diagnosis not present

## 2023-01-25 DIAGNOSIS — S43014A Anterior dislocation of right humerus, initial encounter: Secondary | ICD-10-CM | POA: Insufficient documentation

## 2023-01-25 DIAGNOSIS — M19011 Primary osteoarthritis, right shoulder: Secondary | ICD-10-CM | POA: Diagnosis not present

## 2023-01-25 DIAGNOSIS — Z7984 Long term (current) use of oral hypoglycemic drugs: Secondary | ICD-10-CM | POA: Insufficient documentation

## 2023-01-25 DIAGNOSIS — Z743 Need for continuous supervision: Secondary | ICD-10-CM | POA: Diagnosis not present

## 2023-01-25 DIAGNOSIS — S0003XA Contusion of scalp, initial encounter: Secondary | ICD-10-CM | POA: Diagnosis not present

## 2023-01-25 DIAGNOSIS — R739 Hyperglycemia, unspecified: Secondary | ICD-10-CM | POA: Diagnosis not present

## 2023-01-25 DIAGNOSIS — S4991XA Unspecified injury of right shoulder and upper arm, initial encounter: Secondary | ICD-10-CM | POA: Diagnosis present

## 2023-01-25 MED ORDER — PROPOFOL 10 MG/ML IV BOLUS
60.0000 mg | Freq: Once | INTRAVENOUS | Status: AC
  Administered 2023-01-25: 60 mg via INTRAVENOUS

## 2023-01-25 MED ORDER — FLUMAZENIL 0.5 MG/5ML IV SOLN
0.5000 mg | Freq: Once | INTRAVENOUS | Status: DC
  Filled 2023-01-25: qty 5

## 2023-01-25 MED ORDER — PROPOFOL 10 MG/ML IV BOLUS
40.0000 mg | Freq: Once | INTRAVENOUS | Status: AC
  Administered 2023-01-25: 40 mg via INTRAVENOUS

## 2023-01-25 MED ORDER — FENTANYL CITRATE PF 50 MCG/ML IJ SOSY
50.0000 ug | PREFILLED_SYRINGE | Freq: Once | INTRAMUSCULAR | Status: AC
  Administered 2023-01-25: 50 ug via INTRAVENOUS
  Filled 2023-01-25: qty 1

## 2023-01-25 MED ORDER — PROPOFOL 10 MG/ML IV BOLUS
40.0000 mg | Freq: Once | INTRAVENOUS | Status: AC
  Administered 2023-01-25: 40 mg via INTRAVENOUS
  Filled 2023-01-25: qty 20

## 2023-01-25 MED ORDER — MIDAZOLAM HCL 5 MG/5ML IJ SOLN
10.0000 mg | Freq: Once | INTRAMUSCULAR | Status: AC
  Administered 2023-01-25: 6 mg via INTRAVENOUS
  Filled 2023-01-25: qty 10

## 2023-01-25 NOTE — ED Notes (Signed)
Pt received 100 mcg of  Fentanyl via EMS prior to arrival

## 2023-01-25 NOTE — ED Provider Notes (Signed)
Patient signed out to me by Cheron Schaumann, PA-C pending CT imaging results.  Patient had mechanical fall at home, struck her head on a cabinet and injured her right arm and shoulder area.  No LOC  See previous provider note for complete H&P X-ray of the right shoulder shows anterior dislocation, CT head w/o acute intracranial injury  On my exam, pt with pain step off deformity of the right shoulder area.  also complaining of pain to right wrist.  Wrist imaging ordered.  Shoulder reduction attempted by me and by Dr. Estell Harpin.  Both attempts unsuccessful.  see Dr. Pati Gallo note for procedural sedation.     .Reduction of dislocation  Date/Time: 01/25/2023 11:07 PM  Performed by: Pauline Aus, PA-C Authorized by: Pauline Aus, PA-C  Consent: Verbal consent obtained. Risks and benefits: risks, benefits and alternatives were discussed Consent given by: patient Patient understanding: patient states understanding of the procedure being performed Patient consent: the patient's understanding of the procedure matches consent given Imaging studies: imaging studies available Patient identity confirmed: verbally with patient and arm band Time out: Immediately prior to procedure a "time out" was called to verify the correct patient, procedure, equipment, support staff and site/side marked as required. Local anesthesia used: no  Anesthesia: Local anesthesia used: no Patient tolerance: patient tolerated the procedure well with no immediate complications Comments: Unsuccessful reduction attempt    Orthopedics, Dr. Romeo Apple, was consulted and came to the ER and successfully reduced shoulder.  Patient has been observed in the department for some time, now awake and alert.  Vital signs reassuring.  Appears appropriate for discharge home, sling applied.  Will follow-up with local orthopedics.    DG Shoulder Right Result Date: 01/25/2023 CLINICAL DATA:  Post reduction EXAM: RIGHT SHOULDER -  2+ VIEW COMPARISON:  X-ray right shoulder 01/25/2023, x-ray right shoulder 06/23/2021 FINDINGS: Interval reduction with no dislocation. Cortical irregularity along the glenoid with query underlying fracture. Glenohumeral and acromioclavicular joint degenerative changes. Soft tissues are unremarkable. IMPRESSION: Cortical irregularity along the glenoid with query underlying fracture of either the glenoid or humerus. Electronically Signed   By: Tish Frederickson M.D.   On: 01/25/2023 23:05   DG Wrist Complete Right Result Date: 01/25/2023 CLINICAL DATA:  Recent fall with right wrist pain, initial encounter EXAM: RIGHT WRIST - COMPLETE 3+ VIEW COMPARISON:  None Available. FINDINGS: There is no evidence of fracture or dislocation. There is no evidence of arthropathy or other focal bone abnormality. Soft tissues are unremarkable. IMPRESSION: No acute abnormality noted. Electronically Signed   By: Alcide Clever M.D.   On: 01/25/2023 21:39   DG Shoulder Right Result Date: 01/25/2023 CLINICAL DATA:  Postreduction. EXAM: RIGHT SHOULDER - 2+ VIEW COMPARISON:  Right shoulder radiographs 01/25/2023 and 06/23/2021 FINDINGS: Persistent inferior medial positioning of the humeral head with respect to the glenoid on frontal view, consistent with persistent anterior inferior dislocation. No definite acute fracture is seen. Normal alignment of the acromioclavicular joint. The visualized portion of the right lung is unremarkable. IMPRESSION: Persistent anterior inferior dislocation of the right humeral head. Electronically Signed   By: Neita Garnet M.D.   On: 01/25/2023 21:35   CT Head Wo Contrast Result Date: 01/25/2023 CLINICAL DATA:  Larey Seat and hit head on cabinet headache EXAM: CT HEAD WITHOUT CONTRAST TECHNIQUE: Contiguous axial images were obtained from the base of the skull through the vertex without intravenous contrast. RADIATION DOSE REDUCTION: This exam was performed according to the departmental dose-optimization  program which includes automated exposure control,  adjustment of the mA and/or kV according to patient size and/or use of iterative reconstruction technique. COMPARISON:  None Available. FINDINGS: Brain: No acute territorial infarction, hemorrhage or intracranial mass. The ventricles are nonenlarged Vascular: No hyperdense vessel or unexpected calcification. Skull: Normal. Negative for fracture or focal lesion. Sinuses/Orbits: No acute finding. Other: Moderate forehead scalp hematoma IMPRESSION: 1. Negative non contrasted CT appearance of the brain. 2. Moderate forehead scalp hematoma. Electronically Signed   By: Jasmine Pang M.D.   On: 01/25/2023 19:46   DG Shoulder Right Result Date: 01/25/2023 CLINICAL DATA:  Fall EXAM: RIGHT SHOULDER - 2+ VIEW COMPARISON:  Right shoulder x-ray 06/23/2021 FINDINGS: There is anterior inferior dislocation of the right humeral head. There is no acute fracture identified. Soft tissues are within normal limits. IMPRESSION: Anterior inferior dislocation of the right humeral head. Electronically Signed   By: Darliss Cheney M.D.   On: 01/25/2023 19:14      Pauline Aus, PA-C 01/26/23 Tia Masker, MD 01/27/23 1141

## 2023-01-25 NOTE — Consult Note (Signed)
Consult for right shoulder irreducible dislocation  Hpi 68 yo F s/p arthr rot cuff repair by Dr Dallas Schimke last June  Presents today with right shoulder dislocation 2nd to a fall. 2 attemots at reduction were unscucessful   Dr Estell Harpin / PA Pauline Aus asked for my assistance   Initial reduction attempts were done with propofol and fentanyl  She c/o of no numbness   Physical Exam Vitals and nursing note reviewed.  Constitutional:      Appearance: Normal appearance.  HENT:     Head: Normocephalic and atraumatic.  Eyes:     General: No scleral icterus.       Right eye: No discharge.        Left eye: No discharge.     Extraocular Movements: Extraocular movements intact.     Conjunctiva/sclera: Conjunctivae normal.     Pupils: Pupils are equal, round, and reactive to light.  Cardiovascular:     Rate and Rhythm: Normal rate.     Pulses: Normal pulses.  Skin:    General: Skin is warm and dry.     Capillary Refill: Capillary refill takes less than 2 seconds.  Neurological:     General: No focal deficit present.     Mental Status: She is alert and oriented to person, place, and time.  Psychiatric:        Mood and Affect: Mood normal.        Behavior: Behavior normal.        Thought Content: Thought content normal.        Judgment: Judgment normal.    Right shoulder deformity with normal sensation in the upper extremity   Images show dislocation anteriorly and inferiorly on 2 separate films  3rd xr : 2 views : reduction no frx   DX: ant inf right sh disloctx  Sh immobilizer   F/u 2 weeks     Past Medical History:  Diagnosis Date   Arthritis    Chronic back pain    spondylolishthesis   DDD (degenerative disc disease), lumbosacral    Diabetes mellitus without complication (HCC) diagnosed in 02/2014   takes Metformin and Glipizide daily   GERD (gastroesophageal reflux disease)    takes Omeprazole daily   Gout    takes Colchicine daily as needed   Hypertension     takes Dyazide and Diltiazem daily   Nocturia    Polio    PONV (postoperative nausea and vomiting)    Wears glasses    Past Surgical History:  Procedure Laterality Date   BACK SURGERY  07/2012   bladder tacked      COLONOSCOPY     FOOT SURGERY     more than 5 surgeries on right foot related to polio   SHOULDER ARTHROSCOPY WITH ROTATOR CUFF REPAIR Right 06/15/2021   Procedure: SHOULDER ARTHROSCOPY WITH ROTATOR CUFF REPAIR;  Surgeon: Oliver Barre, MD;  Location: [facility];  Service: Orthopedics;  Laterality: Right;   surgery for polio     states x 5    TOTAL ABDOMINAL HYSTERECTOMY  2002   TUBAL PREGNANCY   TUBAL LIGATION     Family History  Problem Relation Age of Onset   Colon cancer Mother    Diabetes Father    Thyroid disease Father    Heart attack Father    Cancer Sister        lung   Breast cancer Neg Hx    Social History   Tobacco Use   Smoking status:  Never   Smokeless tobacco: Never  Vaping Use   Vaping status: Never Used  Substance Use Topics   Alcohol use: No   Drug use: No   Allergies  Allergen Reactions   Trulicity [Dulaglutide] Other (See Comments)    GI intolerance even at low doses

## 2023-01-25 NOTE — Discharge Instructions (Addendum)
Keep the keep the sling in place until you follow-up with Dr. Romeo Apple in his office.  You may apply ice packs on and off.

## 2023-01-25 NOTE — ED Triage Notes (Signed)
EMS called out to home for a fall, pt was cooking and turned to get something from cabinet and fell. Pt hit head on cabinet and injured right arm and shoulder, which she had previous rotator cuff surgery on. Denies passing out but has slight headache

## 2023-01-25 NOTE — ED Provider Notes (Signed)
[place] EMERGENCY DEPARTMENT AT [facility] Provider Note   CSN: [id number] Arrival date & time: 01/25/23  1746     History  Chief Complaint  Patient presents with   Fall    EMS called out to home for a fall in the kitchen, pt hit head on cabinet denies passing out, has slight headache, has severe pain in right arm/shoulder    Sara Stuart is a 68 y.o. female.  Patient reports that she fell while she was cooking dinner.  Patient reports that she thinks that she turned too quickly lost her balance and fell.  Patient reports that she did not pass out.  Patient has not had any chest pain.  Patient reports that she hit her head and her right shoulder.  Patient complains of pain in her shoulder and her head.  Her husband told her that she did lose consciousness.  Patient denies any injury to her chest abdomen denies any pain in her low back she denies any extremity injury.  Patient has a past medical history of diabetes she reports her glucose has been high today  The history is provided by the patient. No language interpreter was used.  Fall       Home Medications Prior to Admission medications   Medication Sig Start Date End Date Taking? Authorizing Provider  acetaminophen (TYLENOL) 500 MG tablet Take 1 tablet (500 mg total) by mouth every 6 (six) hours as needed for moderate pain. 09/18/21   Daryll Drown, NP  aspirin EC 81 MG tablet Take 1 tablet (81 mg total) by mouth daily. 12/30/12   Catarina Hartshorn, MD  Continuous Blood Gluc Receiver (FREESTYLE LIBRE 2 READER) DEVI Use to test blood sugar continuously. DX: E11.65 05/09/21   Gwenlyn Fudge, FNP  Continuous Blood Gluc Sensor (FREESTYLE LIBRE 2 SENSOR) MISC Use to test blood sugar continuously.  Replace sensor every 14 days. DX: E11.65 05/09/21   Gwenlyn Fudge, FNP  dapagliflozin propanediol (FARXIGA) 10 MG TABS tablet Take 1 tablet (10 mg total) by mouth daily before breakfast. 11/02/22   Mechele Claude, MD  diltiazem  (DILT-XR) 120 MG 24 hr capsule Take 1 capsule (120 mg total) by mouth daily. 11/22/22   Mechele Claude, MD  fexofenadine (ALLEGRA) 180 MG tablet Take 1 tablet (180 mg total) by mouth daily. For allergy symptoms 09/19/22   Mechele Claude, MD  fluticasone [facility]) 50 MCG/ACT nasal spray Place 2 sprays into both nostrils daily as needed. 01/18/23   Mechele Claude, MD  gabapentin (NEURONTIN) 300 MG capsule One each morning and two each evening 01/18/23   Mechele Claude, MD  ibuprofen (ADVIL) 800 MG tablet Take 1 tablet (800 mg total) by mouth 3 (three) times daily. 01/18/23   Mechele Claude, MD  insulin degludec (TRESIBA FLEXTOUCH) 100 UNIT/ML FlexTouch Pen Inject 50 Units into the skin at bedtime. 01/18/23   Mechele Claude, MD  insulin isophane & regular human KwikPen (HUMULIN 70/30 KWIKPEN) (70-30) 100 UNIT/ML KwikPen Inject 39 Units into the skin with breakfast, and 15 with evening meal. DO NOT inject at lunchtime or any other time. MAX 54 units daily 12/19/22   Mechele Claude, MD  Insulin Pen Needle (B-D ULTRAFINE III SHORT PEN) 31G X 8 MM MISC 1 each by Does not apply route as directed. 03/21/17   Roma Kayser, MD  Insulin Syringe-Needle U-100 (B-D INS SYR ULTRAFINE 1CC/30G) 30G X 1/2" 1 ML MISC Use to inject insulin 3 times daily as directed; DX: E11.65  07/26/22   Mechele Claude, MD  lisinopril (ZESTRIL) 2.5 MG tablet Take 1 tablet (2.5 mg total) by mouth daily. 08/15/22   Mechele Claude, MD  metFORMIN (GLUCOPHAGE-XR) 500 MG 24 hr tablet Take 2 tablets (1,000 mg total) by mouth 2 (two) times daily. 11/22/22   Mechele Claude, MD  metoprolol tartrate (LOPRESSOR) 25 MG tablet Take 0.5 tablets (12.5 mg total) by mouth daily. 05/14/22   Mechele Claude, MD  nitroGLYCERIN (NITROSTAT) 0.4 MG SL tablet DISSOLVE ONE TABLET UNDER THE TONGUE EVERY 5 MINUTES AS NEEDED FOR CHEST PAIN.  DO NOT EXCEED A TOTAL OF 3 DOSES IN 15 MINUTES 08/04/21   Antoine Poche, MD  omeprazole (PRILOSEC) 40 MG capsule Take 1 capsule  (40 mg total) by mouth daily. 05/14/22   Mechele Claude, MD  ondansetron (ZOFRAN) 4 MG tablet Take 1 tablet (4 mg total) by mouth every 8 (eight) hours as needed for nausea or vomiting. 03/08/22   Daphine Deutscher, Mary-Margaret, FNP  rosuvastatin (CRESTOR) 10 MG tablet Take 1 tablet (10 mg total) by mouth daily. 05/14/22   Mechele Claude, MD  spironolactone (ALDACTONE) 25 MG tablet Take 1/2 (one-half) tablet by mouth once daily 08/15/22   Mechele Claude, MD  tiZANidine (ZANAFLEX) 4 MG tablet Take 1 tablet (4 mg total) by mouth at bedtime. 08/15/22   Mechele Claude, MD  traZODone (DESYREL) 50 MG tablet Take 1 tablet (50 mg total) by mouth at bedtime. 11/22/22   Mechele Claude, MD  triamterene-hydrochlorothiazide (DYAZIDE) 37.5-25 MG capsule Take 1 each (1 capsule total) by mouth daily. 08/15/22   Mechele Claude, MD      Allergies    Trulicity [dulaglutide]    Review of Systems   Review of Systems  All other systems reviewed and are negative.   Physical Exam Updated Vital Signs BP (!) 153/94 (BP Location: Left Arm)   Pulse 71   Temp 98.4 F (36.9 C) (Oral)   Resp 16   SpO2 92%  Physical Exam Vitals and nursing note reviewed.  Constitutional:      Appearance: She is well-developed.  HENT:     Head: Normocephalic.     Comments: Swelling and bruising forehead Cardiovascular:     Rate and Rhythm: Normal rate.  Pulmonary:     Effort: Pulmonary effort is normal.  Abdominal:     General: There is no distension.  Musculoskeletal:        General: Swelling and tenderness present.     Cervical back: Normal range of motion.     Comments: Tender right shoulder pain with range of motion neurovascular neurosensory are intact  Skin:    General: Skin is warm.  Neurological:     General: No focal deficit present.     Mental Status: She is alert and oriented to person, place, and time.     ED Results / Procedures / Treatments   Labs (all labs ordered are listed, but only abnormal results are  displayed) Labs Reviewed - No data to display  EKG None  Radiology No results found.  Procedures Procedures    Medications Ordered in ED Medications - No data to display  ED Course/ Medical Decision Making/ A&P                                 Medical Decision Making          Final Clinical Impression(s) / ED Diagnoses Final diagnoses:  Fall, initial  encounter  Contusion of face, initial encounter    Rx / DC Orders ED Discharge Orders     None      Pt's care turned over to [facility]  ct head and right shoulder xray pending   Osie Cheeks 01/25/23 Dian Situ, MD 01/27/23 1141

## 2023-01-26 MED ORDER — HYDROCODONE-ACETAMINOPHEN 5-325 MG PO TABS: ORAL_TABLET | ORAL | 0 refills | Status: AC

## 2023-01-26 MED ORDER — OXYCODONE-ACETAMINOPHEN 5-325 MG PO TABS
1.0000 | ORAL_TABLET | Freq: Once | ORAL | Status: AC
  Administered 2023-01-26: 1 via ORAL
  Filled 2023-01-26: qty 1

## 2023-01-27 NOTE — ED Provider Notes (Signed)
.  Sedation  Date/Time: 01/27/2023 11:39 AM  Performed by: Bethann Berkshire, MD Authorized by: Bethann Berkshire, MD   Consent:    Consent obtained:  Verbal   Consent given by:  Patient   Risks discussed:  Allergic reaction, dysrhythmia, inadequate sedation, nausea, prolonged hypoxia resulting in organ damage, prolonged sedation necessitating reversal, respiratory compromise necessitating ventilatory assistance and intubation and vomiting   Alternatives discussed:  Analgesia without sedation, anxiolysis and regional anesthesia Universal protocol:    Procedure explained and questions answered to patient or proxy's satisfaction: yes     Relevant documents present and verified: yes     Test results available: yes     Imaging studies available: yes     Required blood products, implants, devices, and special equipment available: yes     Site/side marked: yes     Immediately prior to procedure, a time out was called: yes     Patient identity confirmed:  Verbally with patient Indications:    Procedure performed:  Dislocation reduction   Procedure necessitating sedation performed by:  Different physician Pre-sedation assessment:    Time since last food or drink:  2 hours   ASA classification: class 1 - normal, healthy patient     Mouth opening:  3 or more finger widths   Thyromental distance:  4 finger widths   Mallampati score:  I - soft palate, uvula, fauces, pillars visible   Neck mobility: normal     Pre-sedation assessments completed and reviewed: airway patency, cardiovascular function, hydration status, mental status, nausea/vomiting, pain level, respiratory function and temperature   A pre-sedation assessment was completed prior to the start of the procedure Immediate pre-procedure details:    Reassessment: Patient reassessed immediately prior to procedure     Reviewed: vital signs, relevant labs/tests and NPO status     Verified: bag valve mask available, emergency equipment available,  intubation equipment available, IV patency confirmed, oxygen available and suction available   Procedure details (see MAR for exact dosages):    Preoxygenation:  Nasal cannula   Sedation:  Propofol   Intended level of sedation: deep   Intra-procedure monitoring:  Blood pressure monitoring, cardiac monitor, continuous pulse oximetry, frequent LOC assessments, frequent vital sign checks and continuous capnometry   Intra-procedure events: none     Total Provider sedation time (minutes):  25 Post-procedure details:   A post-sedation assessment was completed following the completion of the procedure.   Attendance: Constant attendance by certified staff until patient recovered     Recovery: Patient returned to pre-procedure baseline     Post-sedation assessments completed and reviewed: airway patency, cardiovascular function, hydration status, mental status, nausea/vomiting, pain level, respiratory function and temperature     Patient is stable for discharge or admission: yes     Procedure completion:  Tolerated well, no immediate complications Comments:     Patient's shoulder was reduced without problem.  She tolerated the sedation     Bethann Berkshire, MD 01/27/23 1140

## 2023-01-27 NOTE — ED Provider Notes (Signed)
.  Sedation  Date/Time: 01/27/2023 11:33 AM  Performed by: Bethann Berkshire, MD Authorized by: Bethann Berkshire, MD   Consent:    Consent obtained:  Verbal   Consent given by:  Patient   Risks discussed:  Allergic reaction, dysrhythmia, inadequate sedation, nausea, prolonged hypoxia resulting in organ damage, prolonged sedation necessitating reversal, respiratory compromise necessitating ventilatory assistance and intubation and vomiting   Alternatives discussed:  Analgesia without sedation, anxiolysis and regional anesthesia Universal protocol:    Procedure explained and questions answered to patient or proxy's satisfaction: yes     Relevant documents present and verified: yes     Test results available: yes     Imaging studies available: yes     Required blood products, implants, devices, and special equipment available: yes     Site/side marked: yes     Immediately prior to procedure, a time out was called: yes     Patient identity confirmed:  Verbally with patient Indications:    Procedure necessitating sedation performed by:  Physician performing sedation Pre-sedation assessment:    Time since last food or drink:  2 hours   ASA classification: class 1 - normal, healthy patient     Mouth opening:  3 or more finger widths   Thyromental distance:  4 finger widths   Mallampati score:  I - soft palate, uvula, fauces, pillars visible   Neck mobility: normal     Pre-sedation assessments completed and reviewed: airway patency, cardiovascular function, hydration status, mental status, nausea/vomiting, pain level, respiratory function and temperature   A pre-sedation assessment was completed prior to the start of the procedure Immediate pre-procedure details:    Reassessment: Patient reassessed immediately prior to procedure     Reviewed: vital signs, relevant labs/tests and NPO status     Verified: bag valve mask available, emergency equipment available, intubation equipment available, IV  patency confirmed, oxygen available and suction available   Procedure details (see MAR for exact dosages):    Preoxygenation:  Nasal cannula   Sedation:  Propofol   Intended level of sedation: deep   Intra-procedure monitoring:  Blood pressure monitoring, cardiac monitor, continuous pulse oximetry, frequent LOC assessments, frequent vital sign checks and continuous capnometry   Intra-procedure events: none     Total Provider sedation time (minutes):  35 Post-procedure details:   A post-sedation assessment was completed following the completion of the procedure.   Attendance: Constant attendance by certified staff until patient recovered     Recovery: Patient returned to pre-procedure baseline     Post-sedation assessments completed and reviewed: airway patency, cardiovascular function, hydration status, mental status, nausea/vomiting, pain level, respiratory function and temperature     Patient is stable for discharge or admission: yes     Procedure completion:  Tolerated well, no immediate complications Comments:     Patient was sedated with propofol.  Attempt was done to reduce the dislocated right shoulder but it was unsuccessful.  Patient was adequately sedated and tolerated the procedure well     Bethann Berkshire, MD 01/27/23 1135

## 2023-02-04 ENCOUNTER — Telehealth: Payer: Self-pay | Admitting: *Deleted

## 2023-02-04 NOTE — Telephone Encounter (Signed)
Patient is aware that her patient assistance medication is available for pick up.

## 2023-02-06 NOTE — Progress Notes (Signed)
 t

## 2023-02-08 ENCOUNTER — Ambulatory Visit: Payer: Medicare Other | Admitting: Orthopedic Surgery

## 2023-02-08 ENCOUNTER — Other Ambulatory Visit (INDEPENDENT_AMBULATORY_CARE_PROVIDER_SITE_OTHER): Payer: Medicare Other

## 2023-02-08 DIAGNOSIS — M25511 Pain in right shoulder: Secondary | ICD-10-CM

## 2023-02-08 DIAGNOSIS — G8929 Other chronic pain: Secondary | ICD-10-CM | POA: Diagnosis not present

## 2023-02-08 NOTE — Progress Notes (Signed)
 ER follow-up  Encounter Diagnosis  Name Primary?   Chronic right shoulder pain Yes     Dislocation right shoulder closed reduction  Patient had had a rotator cuff repair by Dr. JAYSON last year so that is some concern going forward  Her pain is well-controlled  Her husband is in hospice she had to take her sling off and it has been fine  X-ray today shows maintenance of reduction  Active abduction is 70 degrees active flexion is 90 degrees  Start pendulums and shoulder exercises at home return in a month to reexamine the rotator cuff to see if it was torn

## 2023-02-08 NOTE — Progress Notes (Signed)
   There were no vitals taken for this visit.  There is no height or weight on file to calculate BMI.  Chief Complaint  Patient presents with   Shoulder Pain    Patient in today fell on 01/25/2023 and injured right shoulder  she is having pain today     Encounter Diagnosis  Name Primary?   Chronic right shoulder pain Yes    DOI/DOS/ Date: 01/25/2023  Improved

## 2023-02-14 ENCOUNTER — Encounter: Payer: Self-pay | Admitting: Family Medicine

## 2023-02-14 ENCOUNTER — Encounter (INDEPENDENT_AMBULATORY_CARE_PROVIDER_SITE_OTHER): Payer: Medicare Other | Admitting: Family Medicine

## 2023-02-18 NOTE — Progress Notes (Signed)
 Left without being seen.

## 2023-03-08 ENCOUNTER — Ambulatory Visit: Payer: Medicare Other | Admitting: Orthopedic Surgery

## 2023-03-11 ENCOUNTER — Ambulatory Visit: Payer: Self-pay | Admitting: Family Medicine

## 2023-03-11 NOTE — Telephone Encounter (Signed)
 Copied from CRM [id number]. Topic: Clinical - Red Word Triage >> Mar 11, 2023  1:33 PM Gaetano Hawthorne wrote: [facility] that prompted transfer to Nurse Triage: Patient states that she feels like there's a knot over top of her belly button - she claims there's some pain in that area - not sure what it is - concerned that it may be an infection or something more - patient stated that she's not able to button her pants either.   Chief Complaint: Abdominal pain, knot above belly button Symptoms: pain and knot above belly button Frequency: x 1 week Pertinent Negatives: Patient denies back pain, diarrhea, fever, urination pain, vomiting, chest pain, difficulty breathing, sweating, nausea Disposition: [] ED /[x] Urgent Care (no appt availability in office) / [] Appointment(In office/virtual)/ []  [place] [facility]/ [] Home Care/ [] Refused Recommended Disposition /[] [place] [facility]/ []  Follow-up with PCP Additional Notes: Patient called and advised that a week ago she started having some abdominal pain about a week ago and  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  She states the pain is only 2 out of 10 right now above her belly button but it gets worse throughout the day and is about an 8 at night.  Patient states that there is a "knot" above her belly button as well.  Patient denies back pain, fever, urination pain, vomiting, chest pain, diarrhea, difficulty breathing, sweating, or nausea.  Patient is advised that with constant pain like this and feeling something abnormal it is recommended she been seen by a provider in the next 4 hours.  There is no availability in patient's PCP office and patient states that she was considering going to urgent care.  She is advised that this would be a good idea in order to let a provider evaluate her abdominal pain and assess this  "knot" she is feeling above her belly button. She is also advised that if anything worsens to go to the emergency room.  Patient verbalized understanding and states she will go to the urgent care at this time.    Reason for Disposition  [1] MILD-MODERATE pain AND [2] constant AND [3] present > 2 hours  Answer Assessment - Initial Assessment Questions 1. LOCATION: "Where does it hurt?"      Above belly button  knot there 2. RADIATION: "Does the pain shoot anywhere else?" (e.g., chest, back)     no 3. ONSET: "When did the pain begin?" (e.g., minutes, hours or days ago)      1 week ago 4. SUDDEN: "Gradual or sudden onset?"     gradual 5. PATTERN "Does the pain come and go, or is it constant?"    - If it comes and goes: "How long does it last?" "Do you have pain now?"     (Note: Comes and goes means the pain is intermittent. It goes away completely between bouts.)    - If constant: "Is it getting better, staying the same, or getting worse?"      (Note: Constant means the pain never goes away completely; most serious pain is constant and gets worse.)      Worse at night 6. SEVERITY: "How bad is the pain?"  (e.g., Scale 1-10; mild, moderate, or severe)    - MILD (1-3): Doesn't interfere with normal activities, abdomen soft and not tender  to touch..     - MODERATE (4-7): Interferes with normal activities or awakens from sleep, abdomen tender to touch.     - SEVERE (8-10): Excruciating pain, doubled over, unable to do any normal activities.       2 but 8 at night 7. RECURRENT SYMPTOM: "Have you ever had this type of stomach pain before?" If Yes, ask: "When was the last time?" and "What happened that time?"      no 8. AGGRAVATING FACTORS: "Does anything seem to cause this pain?" (e.g., foods, stress, alcohol)     unsure 9. CARDIAC SYMPTOMS: "Do you have any of the following symptoms: chest pain, difficulty breathing, sweating, nausea?"     no 10. OTHER SYMPTOMS: "Do you have any other  symptoms?" (e.g., back pain, diarrhea, fever, urination pain, vomiting)       No  Answer Assessment - Initial Assessment Questions 1. LOCATION: "Where does it hurt?"      Right above belly button---feels a knot there 2. RADIATION: "Does the pain shoot anywhere else?" (e.g., chest, back)     no 3. ONSET: "When did the pain begin?" (e.g., minutes, hours or days ago)      1 week ago 4. SUDDEN: "Gradual or sudden onset?"     Gradually got bigger 5. PATTERN "Does the pain come and go, or is it constant?"    - If it comes and goes: "How long does it last?" "Do you have pain now?"     (Note: Comes and goes means the pain is intermittent. It goes away completely between bouts.)    - If constant: "Is it getting better, staying the same, or getting worse?"      (Note: Constant means the pain never goes away completely; most serious pain is constant and gets worse.)      Constant but it will ease up in the morning and get worse throughout the day   its an 8 at bed time 6. SEVERITY: "How bad is the pain?"  (e.g., Scale 1-10; mild, moderate, or severe)    - MILD (1-3): Doesn't interfere with normal activities, abdomen soft and not tender to touch.     - MODERATE (4-7): Interferes with normal activities or awakens from sleep, abdomen tender to touch.     - SEVERE (8-10): Excruciating pain, doubled over, unable to do any normal activities.       2 7. RECURRENT SYMPTOM: "Have you ever had this type of stomach pain before?" If Yes, ask: "When was the last time?" and "What happened that time?"      No 8. CAUSE: "What do you think is causing the stomach pain?"     unknown 9. RELIEVING/AGGRAVATING FACTORS: "What makes it better or worse?" (e.g., antacids, bending or twisting motion, bowel movement)     Throughout the day it gets worse 10. OTHER SYMPTOMS: "Do you have any other symptoms?" (e.g., back pain, diarrhea, fever, urination pain, vomiting)       No  Protocols used: Abdominal Pain - Upper-A-AH,  Abdominal Pain - Female-A-AH

## 2023-03-11 NOTE — Telephone Encounter (Signed)
URGENT CARE???

## 2023-03-12 ENCOUNTER — Encounter: Payer: Self-pay | Admitting: Family Medicine

## 2023-03-12 DIAGNOSIS — R1033 Periumbilical pain: Secondary | ICD-10-CM | POA: Diagnosis not present

## 2023-03-13 ENCOUNTER — Ambulatory Visit (INDEPENDENT_AMBULATORY_CARE_PROVIDER_SITE_OTHER): Admitting: Family Medicine

## 2023-03-13 ENCOUNTER — Encounter: Payer: Self-pay | Admitting: Family Medicine

## 2023-03-13 ENCOUNTER — Ambulatory Visit (INDEPENDENT_AMBULATORY_CARE_PROVIDER_SITE_OTHER)

## 2023-03-13 VITALS — BP 110/76 | HR 65 | Temp 97.9°F | Ht 61.0 in | Wt 211.0 lb

## 2023-03-13 DIAGNOSIS — R109 Unspecified abdominal pain: Secondary | ICD-10-CM | POA: Diagnosis not present

## 2023-03-13 DIAGNOSIS — E1140 Type 2 diabetes mellitus with diabetic neuropathy, unspecified: Secondary | ICD-10-CM | POA: Diagnosis not present

## 2023-03-13 DIAGNOSIS — R1033 Periumbilical pain: Secondary | ICD-10-CM | POA: Diagnosis not present

## 2023-03-13 DIAGNOSIS — Z794 Long term (current) use of insulin: Secondary | ICD-10-CM | POA: Diagnosis not present

## 2023-03-13 LAB — BAYER DCA HB A1C WAIVED: HB A1C (BAYER DCA - WAIVED): 8.1 % — ABNORMAL HIGH (ref 4.8–5.6)

## 2023-03-13 NOTE — Progress Notes (Signed)
 Subjective:  Patient ID: Sara Stuart, female    DOB: 1955-01-24  Age: 68 y.o. MRN: [id number]  CC: Hernia (Bulging around belly button. Painful and sore. Noticed one week ago. Does has to do having lifting with husband.  Worse as the day goes on and [pain is higher in the abdomen now. )   HPI Sara Stuart presents for one week of periumbilical pain She has noted a lump. At the site. No NVDC noted. Pain is moderate. Aching. NKI. Appetite is "too good."  presents forFollow-up of diabetes. Patient checks blood sugar at home.  Patient denies symptoms such as polyuria, polydipsia, excessive hunger, nausea No significant hypoglycemic spells noted. Medications reviewed. Pt reports taking them regularly without complication/adverse reaction being reported today.   Lab Results  Component Value Date   HGBA1C 8.1 (H) 03/13/2023   HGBA1C 8.9 (H) 11/22/2022   HGBA1C 7.2 (H) 08/15/2022         03/13/2023    9:49 AM 02/14/2023   10:49 AM 12/20/2022   12:03 PM  Depression screen PHQ 2/9  Decreased Interest 1 0 0  Down, Depressed, Hopeless 1 0 0  PHQ - 2 Score 2 0 0  Altered sleeping 1 3 2   Tired, decreased energy 1 3 2   Change in appetite 1 0 0  Feeling bad or failure about yourself  1 0 0  Trouble concentrating 0 3 0  Moving slowly or fidgety/restless 0 3 0  Suicidal thoughts 0 0 0  PHQ-9 Score 6 12 4   Difficult doing work/chores Not difficult at all Somewhat difficult Not difficult at all    History Sara Stuart has a past medical history of Arthritis, Chronic back pain, DDD (degenerative disc disease), lumbosacral, Diabetes mellitus without complication (HCC) (diagnosed in 02/2014), GERD (gastroesophageal reflux disease), Gout, Hypertension, Nocturia, Polio, PONV (postoperative nausea and vomiting), and Wears glasses.   She has a past surgical history that includes Foot surgery; surgery for polio; bladder tacked ; Back surgery (07/2012); Tubal ligation; Colonoscopy; Total abdominal  hysterectomy (2002); and Shoulder arthroscopy with rotator cuff repair (Right, 06/15/2021).   Her family history includes Cancer in her sister; Colon cancer in her mother; Diabetes in her father; Heart attack in her father; Thyroid disease in her father.She reports that she has never smoked. She has never used smokeless tobacco. She reports that she does not drink alcohol and does not use drugs.    ROS Review of Systems  Constitutional: Negative.  Negative for fever.  HENT: Negative.  Negative for congestion, rhinorrhea and sore throat.   Eyes:  Negative for visual disturbance.  Respiratory:  Negative for cough and shortness of breath.   Cardiovascular:  Negative for chest pain and palpitations.  Gastrointestinal:  Positive for abdominal pain.  Musculoskeletal:  Negative for arthralgias and myalgias.    Objective:  BP 110/76   Pulse 65   Temp 97.9 F (36.6 C)   Ht 5\' 1"  (1.549 m)   Wt 211 lb (95.7 kg)   SpO2 95%   BMI 39.87 kg/m   BP Readings from Last 3 Encounters:  03/13/23 110/76  02/14/23 120/74  01/25/23 120/72    Wt Readings from Last 3 Encounters:  03/13/23 211 lb (95.7 kg)  02/14/23 210 lb (95.3 kg)  01/17/23 210 lb (95.3 kg)     Physical Exam Constitutional:      Appearance: She is well-developed.  HENT:     Head: Normocephalic and atraumatic.  Cardiovascular:     Rate and Rhythm:  Normal rate and regular rhythm.     Heart sounds: No murmur heard. Pulmonary:     Effort: Pulmonary effort is normal.     Breath sounds: Normal breath sounds.  Abdominal:     General: Bowel sounds are normal.     Palpations: Abdomen is soft. There is no mass.     Tenderness: There is abdominal tenderness (minimal a the LUQ adjacent to umbilicus.). There is no guarding or rebound.  Skin:    General: Skin is warm and dry.  Neurological:     Mental Status: She is alert and oriented to person, place, and time.  Psychiatric:        Behavior: Behavior normal.   Abd XR-  increased gas and stool.    Assessment & Plan:   Sara Stuart" was seen today for hernia.  Diagnoses and all orders for this visit:  Acute periumbilical pain -     CBC with Differential/Platelet -     CMP14+EGFR -     Lipase -     DG Abd 2 Views; Future  Type 2 diabetes mellitus with diabetic neuropathy, with long-term current use of insulin (HCC) -     Bayer DCA Hb A1c Waived       I am having Sara Stuart "Sara Stuart" maintain her aspirin EC, Insulin Pen Needle, FreeStyle Libre 2 Sensor, [employer] 2 Reader, nitroGLYCERIN, acetaminophen, ondansetron, omeprazole, rosuvastatin, metoprolol tartrate, Insulin Syringe-Needle U-100, lisinopril, spironolactone, triamterene-hydrochlorothiazide, tiZANidine, fexofenadine, dapagliflozin propanediol, diltiazem, metFORMIN, traZODone, HumuLIN 70/30 KwikPen, gabapentin, ibuprofen, fluticasone, Sara Stuart FlexTouch, and HYDROcodone-acetaminophen.  Allergies as of 03/13/2023       Reactions   Trulicity [dulaglutide] Other (See Comments)   GI intolerance even at low doses        Medication List        Accurate as of March 13, 2023  8:05 PM. If you have any questions, ask your nurse or doctor.          acetaminophen 500 MG tablet Commonly known as: TYLENOL Take 1 tablet (500 mg total) by mouth every 6 (six) hours as needed for moderate pain.   aspirin EC 81 MG tablet Take 1 tablet (81 mg total) by mouth daily.   dapagliflozin propanediol 10 MG Tabs tablet Commonly known as: Farxiga Take 1 tablet (10 mg total) by mouth daily before breakfast.   diltiazem 120 MG 24 hr capsule Commonly known as: Dilt-XR Take 1 capsule (120 mg total) by mouth daily.   fexofenadine 180 MG tablet Commonly known as: ALLEGRA Take 1 tablet (180 mg total) by mouth daily. For allergy symptoms   fluticasone 50 MCG/ACT nasal spray Commonly known as: FLONASE Place 2 sprays into both nostrils daily as needed.   FreeStyle Libre 2 Reader [place] Use to  test blood sugar continuously. DX: E11.65   FreeStyle Libre 2 Sensor Misc Use to test blood sugar continuously.  Replace sensor every 14 days. DX: E11.65   gabapentin 300 MG capsule Commonly known as: NEURONTIN One each morning and two each evening   HumuLIN 70/30 KwikPen (70-30) 100 UNIT/ML KwikPen Generic drug: insulin isophane & regular human KwikPen Inject 39 Units into the skin with breakfast, and 15 with evening meal. DO NOT inject at lunchtime or any other time. MAX 54 units daily   HYDROcodone-acetaminophen 5-325 MG tablet Commonly known as: NORCO/VICODIN Take one tab po q 4 hrs prn pain   ibuprofen 800 MG tablet Commonly known as: ADVIL Take 1 tablet (800 mg total) by mouth 3 (  three) times daily.   Insulin Pen Needle 31G X 8 MM Misc Commonly known as: B-D ULTRAFINE III SHORT PEN 1 each by Does not apply route as directed.   Insulin Syringe-Needle U-100 30G X 1/2" 1 ML Misc Commonly known as: B-D INS SYR ULTRAFINE 1CC/30G Use to inject insulin 3 times daily as directed; DX: E11.65   lisinopril 2.5 MG tablet Commonly known as: ZESTRIL Take 1 tablet (2.5 mg total) by mouth daily.   metFORMIN 500 MG 24 hr tablet Commonly known as: GLUCOPHAGE-XR Take 2 tablets (1,000 mg total) by mouth 2 (two) times daily.   metoprolol tartrate 25 MG tablet Commonly known as: LOPRESSOR Take 0.5 tablets (12.5 mg total) by mouth daily.   nitroGLYCERIN 0.4 MG SL tablet Commonly known as: NITROSTAT DISSOLVE ONE TABLET UNDER THE TONGUE EVERY 5 MINUTES AS NEEDED FOR CHEST PAIN.  DO NOT EXCEED A TOTAL OF 3 DOSES IN 15 MINUTES   omeprazole 40 MG capsule Commonly known as: PRILOSEC Take 1 capsule (40 mg total) by mouth daily.   ondansetron 4 MG tablet Commonly known as: Zofran Take 1 tablet (4 mg total) by mouth every 8 (eight) hours as needed for nausea or vomiting.   rosuvastatin 10 MG tablet Commonly known as: CRESTOR Take 1 tablet (10 mg total) by mouth daily.   spironolactone  25 MG tablet Commonly known as: ALDACTONE Take 1/2 (one-half) tablet by mouth once daily   tiZANidine 4 MG tablet Commonly known as: ZANAFLEX Take 1 tablet (4 mg total) by mouth at bedtime.   traZODone 50 MG tablet Commonly known as: DESYREL Take 1 tablet (50 mg total) by mouth at bedtime.   Sara Stuart FlexTouch 100 UNIT/ML FlexTouch Pen Generic drug: insulin degludec Inject 50 Units into the skin at bedtime.   triamterene-hydrochlorothiazide 37.5-25 MG capsule Commonly known as: DYAZIDE Take 1 each (1 capsule total) by mouth daily.       No serious etiology of the abd mass determined. Mass itself is not palpable.   Follow-up: Return in about 3 months (around 06/13/2023).  Mechele Claude, M.D.

## 2023-03-14 ENCOUNTER — Other Ambulatory Visit: Payer: Self-pay

## 2023-03-14 DIAGNOSIS — E1140 Type 2 diabetes mellitus with diabetic neuropathy, unspecified: Secondary | ICD-10-CM

## 2023-03-14 DIAGNOSIS — E1165 Type 2 diabetes mellitus with hyperglycemia: Secondary | ICD-10-CM

## 2023-03-14 LAB — CMP14+EGFR
ALT: 13 IU/L (ref 0–32)
AST: 14 IU/L (ref 0–40)
Albumin: 4.1 g/dL (ref 3.9–4.9)
Alkaline Phosphatase: 95 IU/L (ref 44–121)
BUN/Creatinine Ratio: 19 (ref 12–28)
BUN: 18 mg/dL (ref 8–27)
Bilirubin Total: 0.3 mg/dL (ref 0.0–1.2)
CO2: 24 mmol/L (ref 20–29)
Calcium: 9.6 mg/dL (ref 8.7–10.3)
Chloride: 100 mmol/L (ref 96–106)
Creatinine, Ser: 0.94 mg/dL (ref 0.57–1.00)
Globulin, Total: 2.6 g/dL (ref 1.5–4.5)
Glucose: 109 mg/dL — ABNORMAL HIGH (ref 70–99)
Potassium: 4 mmol/L (ref 3.5–5.2)
Sodium: 139 mmol/L (ref 134–144)
Total Protein: 6.7 g/dL (ref 6.0–8.5)
eGFR: 67 mL/min/{1.73_m2} (ref >59)

## 2023-03-14 LAB — CBC WITH DIFFERENTIAL/PLATELET
Basophils Absolute: 0.1 10*3/uL (ref 0.0–0.2)
Basos: 0 %
EOS (ABSOLUTE): 0.4 10*3/uL (ref 0.0–0.4)
Eos: 3 %
Hematocrit: 41.6 % (ref 34.0–46.6)
Hemoglobin: 12.6 g/dL (ref 11.1–15.9)
Immature Grans (Abs): 0 10*3/uL (ref 0.0–0.1)
Immature Granulocytes: 0 %
Lymphocytes Absolute: 2.3 10*3/uL (ref 0.7–3.1)
Lymphs: 18 %
MCH: 24.3 pg — ABNORMAL LOW (ref 26.6–33.0)
MCHC: 30.3 g/dL — ABNORMAL LOW (ref 31.5–35.7)
MCV: 80 fL (ref 79–97)
Monocytes Absolute: 0.7 10*3/uL (ref 0.1–0.9)
Monocytes: 6 %
Neutrophils Absolute: 9.7 10*3/uL — ABNORMAL HIGH (ref 1.4–7.0)
Neutrophils: 73 %
Platelets: 331 10*3/uL (ref 150–450)
RBC: 5.18 x10E6/uL (ref 3.77–5.28)
RDW: 14.5 % (ref 11.7–15.4)
WBC: 13.3 10*3/uL — ABNORMAL HIGH (ref 3.4–10.8)

## 2023-03-14 LAB — LIPASE: Lipase: 23 U/L (ref 14–72)

## 2023-03-14 MED ORDER — FREESTYLE LIBRE 2 SENSOR MISC: 11 refills | Status: DC

## 2023-03-16 ENCOUNTER — Encounter: Payer: Self-pay | Admitting: Family Medicine

## 2023-03-20 ENCOUNTER — Telehealth: Payer: Self-pay

## 2023-03-20 NOTE — Telephone Encounter (Signed)
 Addressed in another encounter

## 2023-03-20 NOTE — Telephone Encounter (Signed)
 Copied from CRM [id number]. Topic: General - Other >> Mar 20, 2023  3:03 PM Antwanette L wrote: Reason for CRM: The patient hung up while speaking [facility]. The patient had received a call from the office. Dr. Darlyn Read nurse had called the patient and lvm. Please contact the patient

## 2023-03-29 ENCOUNTER — Telehealth: Payer: Self-pay

## 2023-03-29 NOTE — Telephone Encounter (Signed)
 Pharmacy Patient Advocate Encounter   Received notification from CoverMyMeds that prior authorization for FreeStyle Libre 2 Sensor is required/requested.   Insurance verification completed.   The patient is insured through [facility] .   Per test claim: PA required; PA submitted to above mentioned insurance via CoverMyMeds Key/confirmation #/EOC [id number] Status is pending

## 2023-04-01 ENCOUNTER — Other Ambulatory Visit (HOSPITAL_COMMUNITY): Payer: Self-pay

## 2023-04-01 NOTE — Telephone Encounter (Signed)
 Pharmacy Patient Advocate Encounter  Received notification from [facility] that Prior Authorization for FreeStyle Libre 2 Sensor  has been APPROVED from 03/29/23 to 01/01/24. Ran test claim, Copay is $0. This test claim was processed through [facility]- copay amounts may vary at other pharmacies due to pharmacy/plan contracts, or as the patient moves through the different stages of their insurance plan.   PA #/Case ID/Reference #: [id number]

## 2023-04-19 ENCOUNTER — Other Ambulatory Visit: Payer: Self-pay | Admitting: Family Medicine

## 2023-04-19 DIAGNOSIS — Z0000 Encounter for general adult medical examination without abnormal findings: Secondary | ICD-10-CM

## 2023-04-26 ENCOUNTER — Other Ambulatory Visit: Payer: Self-pay | Admitting: Family Medicine

## 2023-05-08 ENCOUNTER — Encounter: Payer: Self-pay | Admitting: Pharmacist

## 2023-05-09 ENCOUNTER — Telehealth: Payer: Self-pay

## 2023-05-09 ENCOUNTER — Other Ambulatory Visit: Payer: Self-pay | Admitting: Family Medicine

## 2023-05-09 DIAGNOSIS — I10 Essential (primary) hypertension: Secondary | ICD-10-CM

## 2023-05-09 NOTE — Telephone Encounter (Signed)
 Copied from CRM [id number]. Topic: General - Other >> May 09, 2023  1:02 PM Emylou G wrote: Reason for CRM: Howell Macintosh w/[employer] called checking status of fax to req below Continuous Blood Gluc Receiver (FREESTYLE LIBRE 2 READER) DEVI Continuous Glucose Sensor (FREESTYLE LIBRE 2 SENSOR) MISC contact number: [phone]  they are needing a prescriptions and a progress note for the last 6 months

## 2023-05-13 ENCOUNTER — Ambulatory Visit
Admission: RE | Admit: 2023-05-13 | Discharge: 2023-05-13 | Disposition: A | Discharge status: Home / Self Care | Source: Ambulatory Visit | Attending: Family Medicine | Admitting: Family Medicine

## 2023-05-13 DIAGNOSIS — Z0000 Encounter for general adult medical examination without abnormal findings: Secondary | ICD-10-CM

## 2023-05-13 DIAGNOSIS — Z1231 Encounter for screening mammogram for malignant neoplasm of breast: Secondary | ICD-10-CM | POA: Diagnosis not present

## 2023-05-14 ENCOUNTER — Telehealth: Payer: Self-pay | Admitting: Pharmacist

## 2023-05-14 DIAGNOSIS — E1140 Type 2 diabetes mellitus with diabetic neuropathy, unspecified: Secondary | ICD-10-CM | POA: Diagnosis not present

## 2023-05-14 DIAGNOSIS — E1165 Type 2 diabetes mellitus with hyperglycemia: Secondary | ICD-10-CM

## 2023-05-14 NOTE — Telephone Encounter (Signed)
 T2DM/CGM Patient to transition to FSL3+ due to upcoming discontinuation of FSL2 Will need reader too/phone not compatible Order sent to [facility] via [facility] portal They will route records to [facility]  Sara Stuart, PharmD, BCACP, CPP Clinical Pharmacist, [facility]

## 2023-05-14 NOTE — Telephone Encounter (Signed)
 Submitted FSL3 plus orders to [facility] via [employer] portal they will forward to [employer] Patient needs to switch to FSL3+ due to upcoming discontinuation of FSL2 CGM

## 2023-05-21 ENCOUNTER — Telehealth: Payer: Self-pay | Admitting: Family Medicine

## 2023-06-04 ENCOUNTER — Telehealth: Payer: Self-pay

## 2023-06-04 MED ORDER — FREESTYLE LIBRE 3 PLUS SENSOR MISC: 5 refills | Status: AC

## 2023-06-04 MED ORDER — FREESTYLE LIBRE 3 READER DEVI: 1 refills | Status: AC

## 2023-06-04 NOTE — Telephone Encounter (Signed)
 Rec'd fax from [employer] requesting refills for patients Farxiga .   Please send a 90 day supply to [facility] pharmacy, with refills, if applicable.   Thanks!

## 2023-06-05 ENCOUNTER — Telehealth: Payer: Self-pay

## 2023-06-05 ENCOUNTER — Other Ambulatory Visit: Payer: Self-pay | Admitting: Family Medicine

## 2023-06-05 DIAGNOSIS — E1165 Type 2 diabetes mellitus with hyperglycemia: Secondary | ICD-10-CM

## 2023-06-05 MED ORDER — DAPAGLIFLOZIN PROPANEDIOL 10 MG PO TABS: 10.0000 mg | ORAL_TABLET | Freq: Every day | ORAL | 4 refills | Status: AC

## 2023-06-05 NOTE — Telephone Encounter (Signed)
 Pt notified via detailed VM. LS

## 2023-06-05 NOTE — Telephone Encounter (Signed)
Noted  -LS

## 2023-06-05 NOTE — Telephone Encounter (Signed)
 Copied from CRM [id number]. Topic: General - Other >> Jun 05, 2023 11:44 AM Sara Stuart wrote: Reason for CRM: Patient called in returning a call from the office. Informed patient her prescription had been sent to the pharmacy. Patient stated she didn't need Farxiga  as she already had 3 bottles.

## 2023-06-05 NOTE — Telephone Encounter (Signed)
 This is a patient assistance medication that ships from [employer] to patients home automatically every 3 months :) patient will receive order on autofill once she is almost out of her current supply.

## 2023-06-05 NOTE — Telephone Encounter (Signed)
 Please let the patient know that I sent their prescription to their pharmacy. Thanks, WS

## 2023-06-06 ENCOUNTER — Other Ambulatory Visit

## 2023-06-10 ENCOUNTER — Other Ambulatory Visit: Payer: Self-pay | Admitting: Family Medicine

## 2023-06-10 DIAGNOSIS — E782 Mixed hyperlipidemia: Secondary | ICD-10-CM

## 2023-06-10 DIAGNOSIS — I2510 Atherosclerotic heart disease of native coronary artery without angina pectoris: Secondary | ICD-10-CM

## 2023-06-13 ENCOUNTER — Ambulatory Visit: Admitting: Family Medicine

## 2023-06-27 ENCOUNTER — Telehealth: Payer: Self-pay

## 2023-06-27 IMAGING — MG MM DIGITAL SCREENING BILAT W/ TOMO AND CAD
8 series · 8 of 24 positions shown · non-contrast
Comparison: Previous exam(s).

CLINICAL DATA: Screening.

EXAM:
DIGITAL SCREENING BILATERAL MAMMOGRAM WITH TOMOSYNTHESIS AND CAD
TECHNIQUE: Bilateral screening digital craniocaudal and mediolateral oblique
mammograms were obtained. Bilateral screening digital breast
tomosynthesis was performed. The images were evaluated with
computer-aided detection.

[R MLO synth-2D]
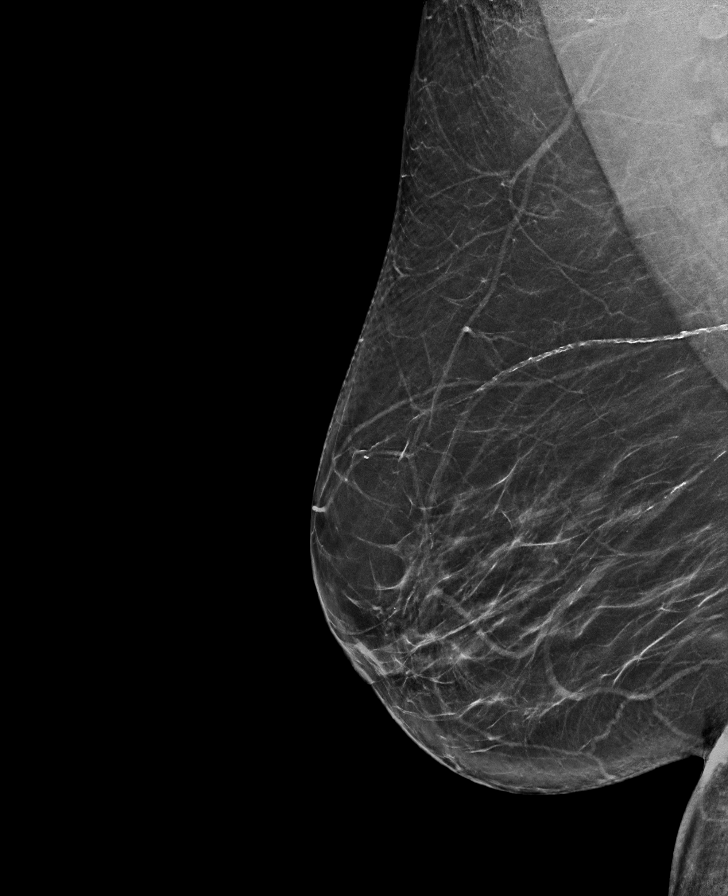

[L CC synth-2D]
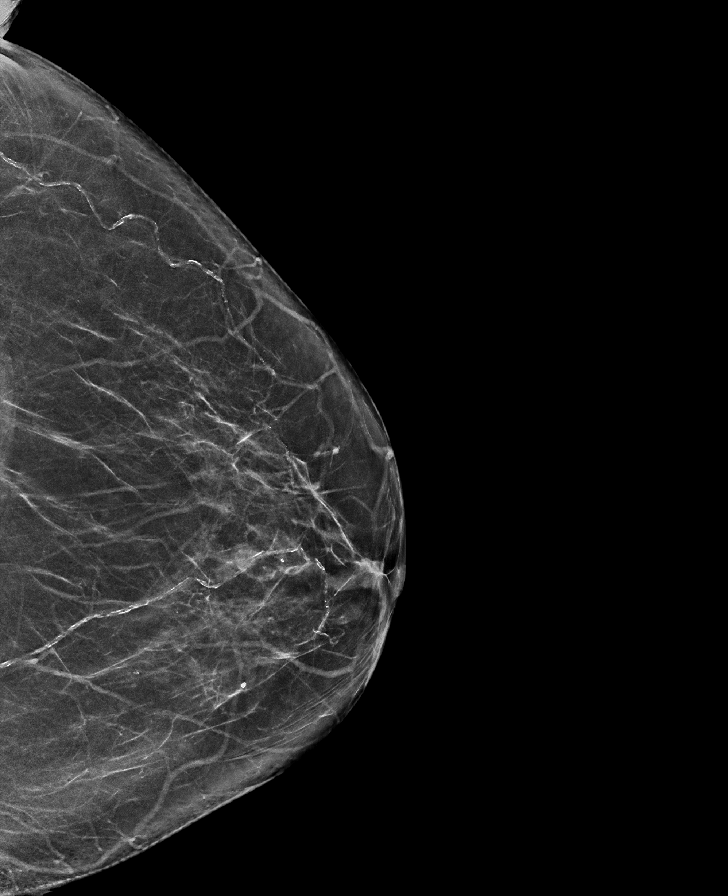

[L MLO synth-2D]
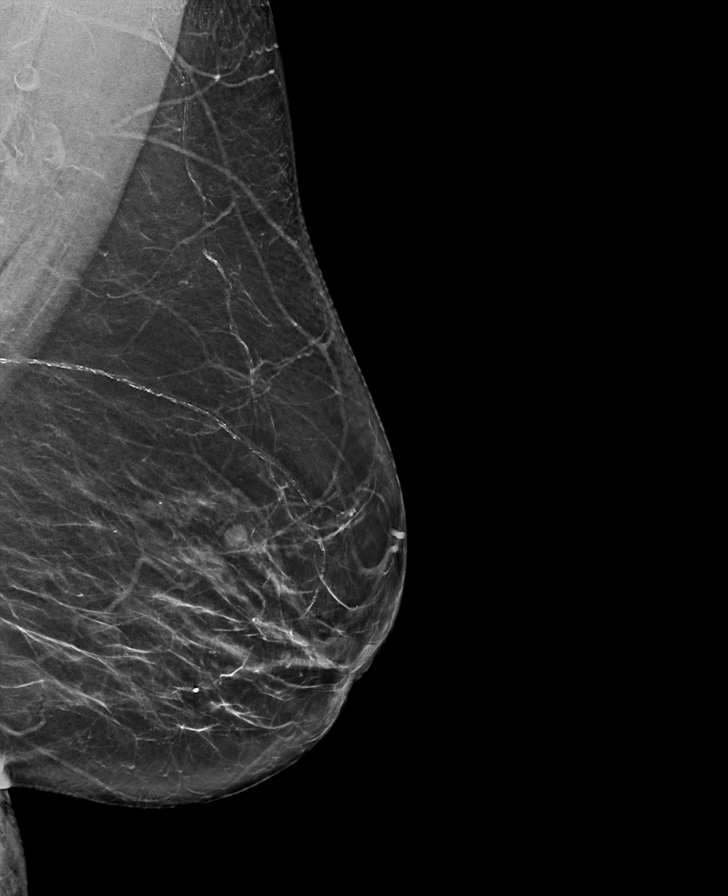

[R CC synth-2D]
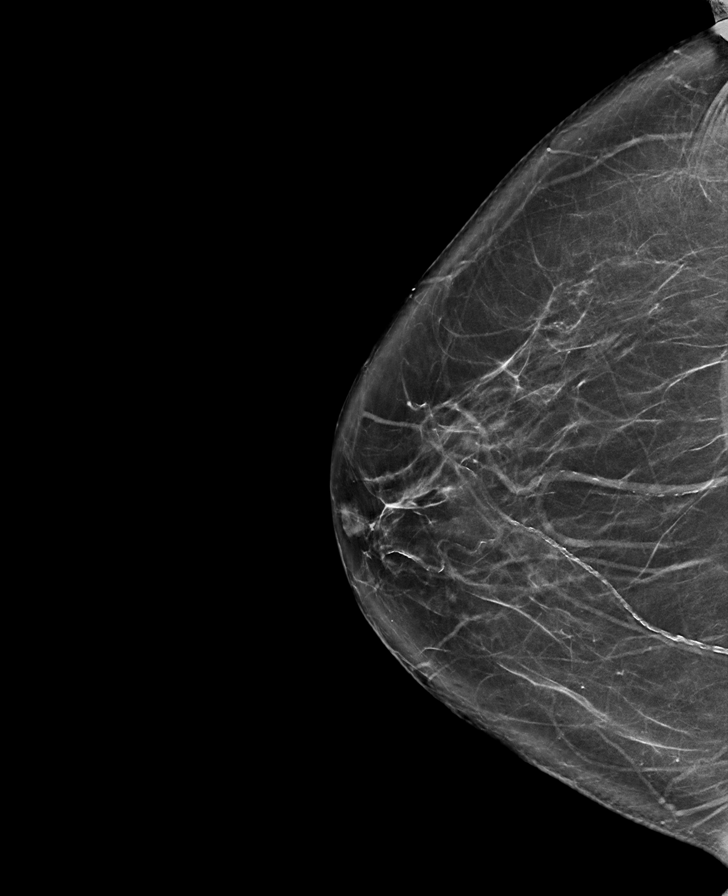

[L CC tomo · tomo slice 37/72.0]
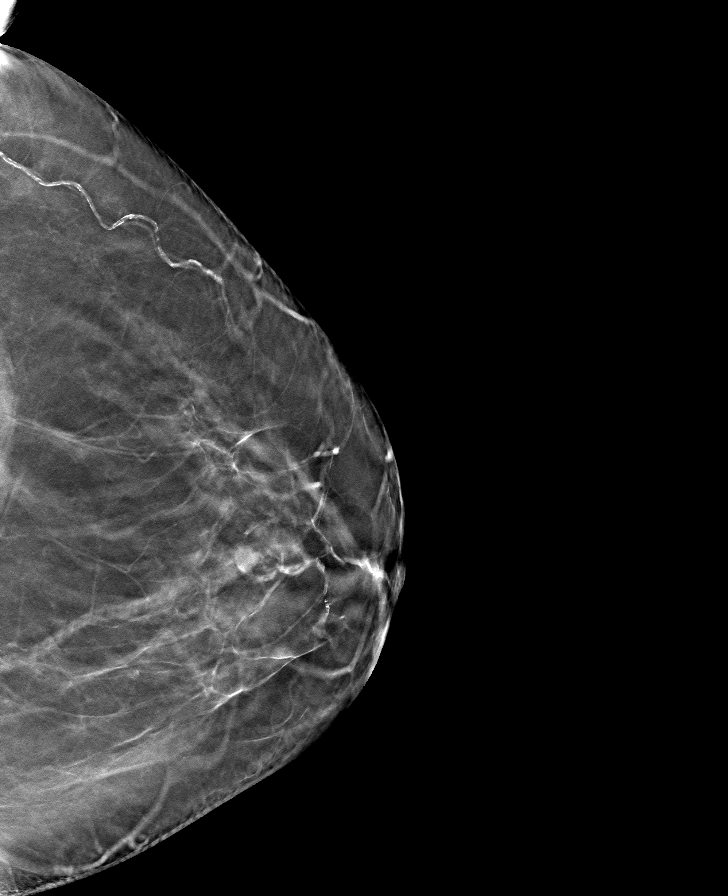

[R MLO tomo · tomo slice 39/76.0]
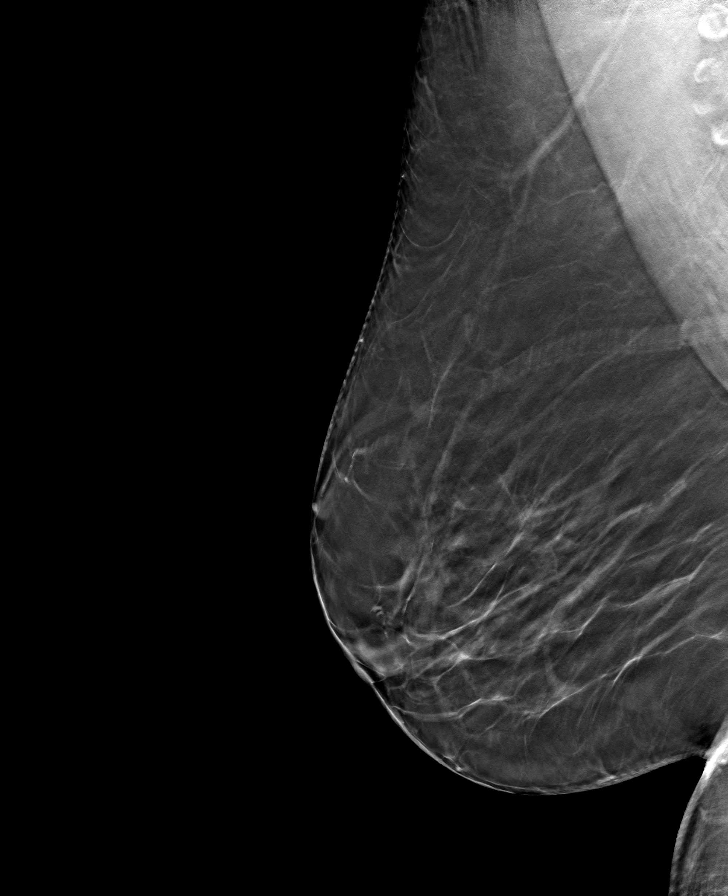

[R CC tomo · tomo slice 39/76.0]
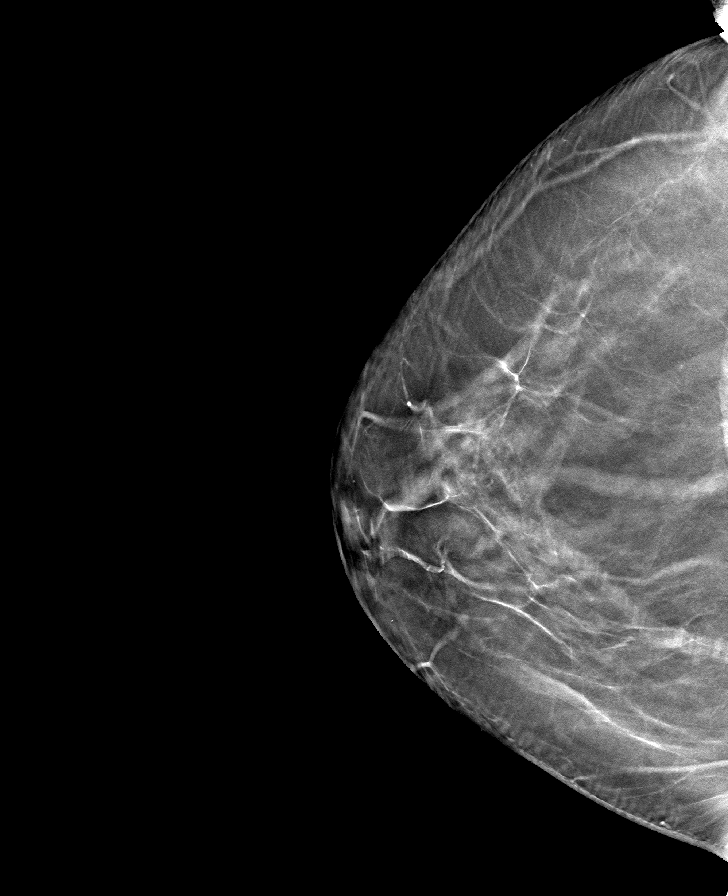

[L MLO tomo · tomo slice 38/75.0]
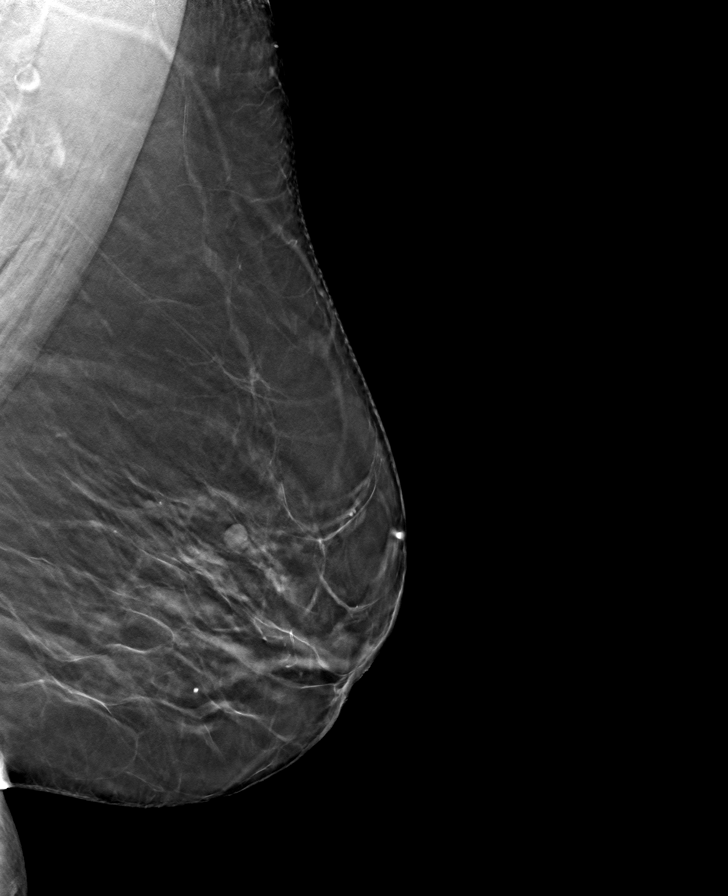

[8 of 24 positions shown; findings below may reference images not displayed]

ACR Breast Density Category c: The breast tissue is heterogeneously
dense, which may obscure small masses.
FINDINGS: There are no findings suspicious for malignancy.
IMPRESSION: No mammographic evidence of malignancy. A result letter of this
screening mammogram will be mailed directly to the patient.

RECOMMENDATION:
Screening mammogram in one year. (Code:Q3-W-BC3)

BI-RADS CATEGORY  1: Negative.

## 2023-07-03 ENCOUNTER — Ambulatory Visit (INDEPENDENT_AMBULATORY_CARE_PROVIDER_SITE_OTHER): Admitting: Family Medicine

## 2023-07-03 ENCOUNTER — Encounter: Payer: Self-pay | Admitting: Family Medicine

## 2023-07-03 ENCOUNTER — Telehealth: Payer: Self-pay

## 2023-07-03 VITALS — BP 97/65 | HR 59 | Temp 98.0°F | Ht 61.0 in | Wt 213.0 lb

## 2023-07-03 DIAGNOSIS — Z794 Long term (current) use of insulin: Secondary | ICD-10-CM

## 2023-07-03 DIAGNOSIS — M48062 Spinal stenosis, lumbar region with neurogenic claudication: Secondary | ICD-10-CM | POA: Diagnosis not present

## 2023-07-03 DIAGNOSIS — I700 Atherosclerosis of aorta: Secondary | ICD-10-CM

## 2023-07-03 DIAGNOSIS — E1165 Type 2 diabetes mellitus with hyperglycemia: Secondary | ICD-10-CM

## 2023-07-03 DIAGNOSIS — I10 Essential (primary) hypertension: Secondary | ICD-10-CM | POA: Diagnosis not present

## 2023-07-03 LAB — CBC WITH DIFFERENTIAL/PLATELET
Basophils Absolute: 0.1 10*3/uL (ref 0.0–0.2)
Basos: 1 %
EOS (ABSOLUTE): 0.5 10*3/uL — ABNORMAL HIGH (ref 0.0–0.4)
Eos: 5 %
Hematocrit: 41.1 % (ref 34.0–46.6)
Hemoglobin: 12.6 g/dL (ref 11.1–15.9)
Immature Grans (Abs): 0 10*3/uL (ref 0.0–0.1)
Immature Granulocytes: 0 %
Lymphocytes Absolute: 2 10*3/uL (ref 0.7–3.1)
Lymphs: 20 %
MCH: 24.7 pg — ABNORMAL LOW (ref 26.6–33.0)
MCHC: 30.7 g/dL — ABNORMAL LOW (ref 31.5–35.7)
MCV: 81 fL (ref 79–97)
Monocytes Absolute: 0.7 10*3/uL (ref 0.1–0.9)
Monocytes: 7 %
Neutrophils Absolute: 6.4 10*3/uL (ref 1.4–7.0)
Neutrophils: 67 %
Platelets: 317 10*3/uL (ref 150–450)
RBC: 5.1 x10E6/uL (ref 3.77–5.28)
RDW: 14.7 % (ref 11.7–15.4)
WBC: 9.6 10*3/uL (ref 3.4–10.8)

## 2023-07-03 LAB — CMP14+EGFR
ALT: 24 IU/L (ref 0–32)
AST: 21 IU/L (ref 0–40)
Albumin: 4.2 g/dL (ref 3.9–4.9)
Alkaline Phosphatase: 94 IU/L (ref 44–121)
BUN/Creatinine Ratio: 22 (ref 12–28)
BUN: 22 mg/dL (ref 8–27)
Bilirubin Total: 0.3 mg/dL (ref 0.0–1.2)
CO2: 21 mmol/L (ref 20–29)
Calcium: 9.5 mg/dL (ref 8.7–10.3)
Chloride: 101 mmol/L (ref 96–106)
Creatinine, Ser: 0.99 mg/dL (ref 0.57–1.00)
Globulin, Total: 2.5 g/dL (ref 1.5–4.5)
Glucose: 145 mg/dL — ABNORMAL HIGH (ref 70–99)
Potassium: 4.4 mmol/L (ref 3.5–5.2)
Sodium: 139 mmol/L (ref 134–144)
Total Protein: 6.7 g/dL (ref 6.0–8.5)
eGFR: 62 mL/min/{1.73_m2} (ref >59)

## 2023-07-03 LAB — BAYER DCA HB A1C WAIVED: HB A1C (BAYER DCA - WAIVED): 8.4 % — ABNORMAL HIGH (ref 4.8–5.6)

## 2023-07-03 MED ORDER — SPIRONOLACTONE 25 MG PO TABS: ORAL_TABLET | ORAL | 3 refills | Status: AC

## 2023-07-03 MED ORDER — HUMULIN 70/30 KWIKPEN (70-30) 100 UNIT/ML ~~LOC~~ SUPN: PEN_INJECTOR | SUBCUTANEOUS | 3 refills | Status: DC

## 2023-07-03 MED ORDER — TRIAMTERENE-HCTZ 37.5-25 MG PO CAPS: 1.0000 | ORAL_CAPSULE | Freq: Every day | ORAL | 3 refills | Status: AC

## 2023-07-03 MED ORDER — LISINOPRIL 2.5 MG PO TABS: 2.5000 mg | ORAL_TABLET | Freq: Every day | ORAL | 3 refills | Status: AC

## 2023-07-03 MED ORDER — FEXOFENADINE HCL 180 MG PO TABS: 180.0000 mg | ORAL_TABLET | Freq: Every day | ORAL | 11 refills | Status: AC

## 2023-07-03 MED ORDER — METOPROLOL TARTRATE 25 MG PO TABS: 12.5000 mg | ORAL_TABLET | Freq: Every day | ORAL | 0 refills | Status: DC

## 2023-07-03 MED ORDER — TRESIBA FLEXTOUCH 100 UNIT/ML ~~LOC~~ SOPN: 55.0000 [IU] | PEN_INJECTOR | Freq: Every day | SUBCUTANEOUS | 0 refills | Status: DC

## 2023-07-03 MED ORDER — TIZANIDINE HCL 6 MG PO CAPS: 6.0000 mg | ORAL_CAPSULE | Freq: Every day | ORAL | 5 refills | Status: DC

## 2023-07-03 MED ORDER — METFORMIN HCL ER 500 MG PO TB24: 1000.0000 mg | ORAL_TABLET | Freq: Two times a day (BID) | ORAL | 3 refills | Status: AC

## 2023-07-03 NOTE — Progress Notes (Signed)
 Subjective:  Patient ID: Sara Stuart, female    DOB: 07/16/1955  Age: 68 y.o. MRN: [id number]  CC: Medical Management of Chronic Issues (3 weeks of hip pain. Previous back surgeries. Sitting and walking a lot make it worse. 7/)   HPI Sara Stuart presents for presents for Follow-up of diabetes. Patient checks blood sugar at home.   100-170 fasting and 130-250 postprandial. Log reviewed.  Patient denies symptoms such as polyuria, polydipsia, excessive hunger, nausea No significant hypoglycemic spells noted. Medications reviewed. Pt reports taking them regularly without complication/adverse reaction being reported today.   Back pain flaring. CC above noted.   Lab Results  Component Value Date   HGBA1C 8.4 (H) 07/03/2023   HGBA1C 8.1 (H) 03/13/2023   HGBA1C 8.9 (H) 11/22/2022         03/13/2023    9:49 AM 02/14/2023   10:49 AM 12/20/2022   12:03 PM  Depression screen PHQ 2/9  Decreased Interest 1 0 0  Down, Depressed, Hopeless 1 0 0  PHQ - 2 Score 2 0 0  Altered sleeping 1 3 2   Tired, decreased energy 1 3 2   Change in appetite 1 0 0  Feeling bad or failure about yourself  1 0 0  Trouble concentrating 0 3 0  Moving slowly or fidgety/restless 0 3 0  Suicidal thoughts 0 0 0  PHQ-9 Score 6 12 4   Difficult doing work/chores Not difficult at all Somewhat difficult Not difficult at all    History Sara Stuart has a past medical history of Arthritis, Chronic back pain, DDD (degenerative disc disease), lumbosacral, Diabetes mellitus without complication (HCC) (diagnosed in 02/2014), GERD (gastroesophageal reflux disease), Gout, Hypertension, Nocturia, Polio, PONV (postoperative nausea and vomiting), and Wears glasses.   Sara Stuart has a past surgical history that includes Foot surgery; surgery for polio; bladder tacked ; Back surgery (07/2012); Tubal ligation; Colonoscopy; Total abdominal hysterectomy (2002); and Shoulder arthroscopy with rotator cuff repair (Right, 06/15/2021).   Her family  history includes Cancer in her sister; Colon cancer in her mother; Diabetes in her father; Heart attack in her father; Thyroid disease in her father.Sara Stuart reports that Sara Stuart has never smoked. Sara Stuart has never used smokeless tobacco. Sara Stuart reports that Sara Stuart does not drink alcohol and does not use drugs.    ROS Review of Systems  Constitutional: Negative.   HENT: Negative.    Eyes:  Negative for visual disturbance.  Respiratory:  Negative for shortness of breath.   Cardiovascular:  Negative for chest pain.  Gastrointestinal:  Negative for abdominal pain.  Musculoskeletal:  Negative for arthralgias.    Objective:  BP 97/65   Pulse (!) 59   Temp 98 F (36.7 C)   Ht 5' 1 (1.549 m)   Wt 213 lb (96.6 kg)   SpO2 95%   BMI 40.25 kg/m   BP Readings from Last 3 Encounters:  07/03/23 97/65  03/13/23 110/76  02/14/23 120/74    Wt Readings from Last 3 Encounters:  07/03/23 213 lb (96.6 kg)  03/13/23 211 lb (95.7 kg)  02/14/23 210 lb (95.3 kg)     Physical Exam Constitutional:      General: Sara Stuart is not in acute distress.    Appearance: Sara Stuart is well-developed.  Cardiovascular:     Rate and Rhythm: Normal rate and regular rhythm.  Pulmonary:     Breath sounds: Normal breath sounds.  Musculoskeletal:        General: Normal range of motion.  Skin:    General: Skin  is warm and dry.  Neurological:     Mental Status: Sara Stuart is alert and oriented to person, place, and time.      Assessment & Plan:  Uncontrolled type 2 diabetes mellitus with hyperglycemia, with long-term current use of insulin  (HCC) -     Bayer DCA Hb A1c Waived -     metFORMIN  HCl ER; Take 2 tablets (1,000 mg total) by mouth 2 (two) times daily.  Dispense: 360 tablet; Refill: 3 -     HumuLIN  70/30 KwikPen; Inject 42 Units into the skin with breakfast, and 18 with evening meal. DO NOT inject at lunchtime or any other time. MAX 60 units dailyI  Dispense: 60 mL; Refill: 3 -     Tresiba  FlexTouch; Inject 55 Units into the skin  at bedtime.  Dispense: 16.5 mL; Refill: 0  Essential hypertension, benign -     CBC with Differential/Platelet -     Triamterene -HCTZ; Take 1 each (1 capsule total) by mouth daily.  Dispense: 90 capsule; Refill: 3 -     Lisinopril ; Take 1 tablet (2.5 mg total) by mouth daily.  Dispense: 90 tablet; Refill: 3 -     Metoprolol  Tartrate; Take 0.5 tablets (12.5 mg total) by mouth daily.  Dispense: 45 tablet; Refill: 0 -     Spironolactone ; Take 1/2 (one-half) tablet by mouth once daily  Dispense: 45 tablet; Refill: 3  Aortic atherosclerosis (HCC) -     CBC with Differential/Platelet -     CMP14+EGFR  Spinal stenosis, lumbar region, with neurogenic claudication -     tiZANidine  HCl; Take 1 capsule (6 mg total) by mouth at bedtime.  Dispense: 30 capsule; Refill: 5  Other orders -     Fexofenadine  HCl; Take 1 tablet (180 mg total) by mouth daily. For allergy symptoms  Dispense: 30 tablet; Refill: 11     Follow-up: Return in about 3 months (around 10/03/2023).  Butler Der, M.D.

## 2023-07-03 NOTE — Telephone Encounter (Signed)
 Pharmacy Patient Advocate Encounter   Received notification from CoverMyMeds that prior authorization for Tizanidine  6 mg capsules is required/requested.   Insurance verification completed.   The patient is insured through [facility] .   Per test claim: PA required; PA submitted to above mentioned insurance via CoverMyMeds Key/confirmation #/EOC [id number] Status is pending

## 2023-07-04 ENCOUNTER — Other Ambulatory Visit: Payer: Self-pay | Admitting: Family Medicine

## 2023-07-08 ENCOUNTER — Other Ambulatory Visit (HOSPITAL_COMMUNITY): Payer: Self-pay

## 2023-07-08 NOTE — Telephone Encounter (Signed)
 Pharmacy Patient Advocate Encounter  Received notification from [facility] that Prior Authorization for tiZANidine  HCl 6MG  capsules has been APPROVED from 07/03/2023 to 01/01/2024. Ran test claim, Copay is $6.29. This test claim was processed through [facility]- copay amounts may vary at other pharmacies due to pharmacy/plan contracts, or as the patient moves through the different stages of their insurance plan.   PA #/Case ID/Reference #: [id number]

## 2023-07-11 ENCOUNTER — Ambulatory Visit: Payer: Self-pay | Admitting: Family Medicine

## 2023-07-29 ENCOUNTER — Other Ambulatory Visit: Payer: Self-pay | Admitting: Family Medicine

## 2023-08-21 ENCOUNTER — Telehealth: Payer: Self-pay | Admitting: Family Medicine

## 2023-08-21 NOTE — Telephone Encounter (Signed)
 Pt wants to know if [name] has any libre 2 sensors. She she out.

## 2023-08-22 DIAGNOSIS — E1165 Type 2 diabetes mellitus with hyperglycemia: Secondary | ICD-10-CM | POA: Diagnosis not present

## 2023-08-22 NOTE — Telephone Encounter (Signed)
 Patient aware

## 2023-09-07 ENCOUNTER — Other Ambulatory Visit: Payer: Self-pay | Admitting: Family Medicine

## 2023-09-07 DIAGNOSIS — I2510 Atherosclerotic heart disease of native coronary artery without angina pectoris: Secondary | ICD-10-CM

## 2023-09-07 DIAGNOSIS — E782 Mixed hyperlipidemia: Secondary | ICD-10-CM

## 2023-10-02 ENCOUNTER — Encounter: Payer: Self-pay | Admitting: Family Medicine

## 2023-10-02 ENCOUNTER — Ambulatory Visit: Admitting: Family Medicine

## 2023-10-02 VITALS — BP 99/62 | HR 61 | Temp 97.8°F | Ht 61.0 in | Wt 215.0 lb

## 2023-10-02 DIAGNOSIS — E782 Mixed hyperlipidemia: Secondary | ICD-10-CM | POA: Diagnosis not present

## 2023-10-02 DIAGNOSIS — M79671 Pain in right foot: Secondary | ICD-10-CM

## 2023-10-02 DIAGNOSIS — I10 Essential (primary) hypertension: Secondary | ICD-10-CM | POA: Diagnosis not present

## 2023-10-02 DIAGNOSIS — E1165 Type 2 diabetes mellitus with hyperglycemia: Secondary | ICD-10-CM | POA: Diagnosis not present

## 2023-10-02 DIAGNOSIS — E1140 Type 2 diabetes mellitus with diabetic neuropathy, unspecified: Secondary | ICD-10-CM | POA: Diagnosis not present

## 2023-10-02 DIAGNOSIS — M79672 Pain in left foot: Secondary | ICD-10-CM | POA: Diagnosis not present

## 2023-10-02 DIAGNOSIS — Z794 Long term (current) use of insulin: Secondary | ICD-10-CM

## 2023-10-02 LAB — BAYER DCA HB A1C WAIVED: HB A1C (BAYER DCA - WAIVED): 8.5 % — ABNORMAL HIGH (ref 4.8–5.6)

## 2023-10-02 LAB — LIPID PANEL

## 2023-10-02 MED ORDER — HUMULIN 70/30 KWIKPEN (70-30) 100 UNIT/ML ~~LOC~~ SUPN: PEN_INJECTOR | SUBCUTANEOUS | 3 refills | Status: AC

## 2023-10-02 NOTE — Progress Notes (Signed)
 Subjective:  Patient ID: Sara Stuart, female    DOB: [birth year]  Age: 68 y.o. MRN: [id number]  CC: Medical Management of Chronic Issues   HPI  Discussed the use of AI scribe software for clinical note transcription with the patient, who gave verbal consent to proceed.  History of Present Illness Sara Stuart is a 68 year old female with diabetes who presents with fatigue and foot pain.  She experiences fatigue, which she attributes to caring for Prentice, who is very weak and requires assistance with daily activities. She uses a potty chair beside the bed for him as he cannot walk to the bathroom.  She monitors her blood sugar levels, which show variability. Fasting blood sugars are generally good, but she has experienced high readings, such as 269 and 281 over the past weekend. She occasionally forgets to take her insulin , contributing to elevated levels. She takes 18 units of insulin  around supper and 5 units at bedtime, sometimes adjusting the dose to 56 units if her blood sugar is very high. She has been on insulin  since being diagnosed with diabetes about six years ago.  She reports significant foot pain, describing it as an 'ache like a toothache' in her heels with a burning sensation. The pain primarily affects her heels, extending to the middle of her foot, and is accompanied by some numbness. The pain is severe enough to impact her ability to shop for groceries, as she has to leave due to the discomfort. She takes gabapentin , one in the morning and two in the evening, for this issue. She reports some numbness in her feet. Describes burning sensation primarily in heels.          10/02/2023   11:03 AM 03/13/2023    9:49 AM 02/14/2023   10:49 AM  Depression screen PHQ 2/9  Decreased Interest 3 1 0  Down, Depressed, Hopeless 0 1 0  PHQ - 2 Score 3 2 0  Altered sleeping 0 1 3  Tired, decreased energy 3 1 3   Change in appetite 3 1 0  Feeling bad or failure about yourself  0 1  0  Trouble concentrating 0 0 3  Moving slowly or fidgety/restless 0 0 3  Suicidal thoughts 0 0 0  PHQ-9 Score 9 6 12   Difficult doing work/chores  Not difficult at all Somewhat difficult    History Kattia has a past medical history of Arthritis, Chronic back pain, DDD (degenerative disc disease), lumbosacral, Diabetes mellitus without complication (HCC) (diagnosed in 02/2014), GERD (gastroesophageal reflux disease), Gout, Hypertension, Nocturia, Polio, PONV (postoperative nausea and vomiting), and Wears glasses.   She has a past surgical history that includes Foot surgery; surgery for polio; bladder tacked ; Back surgery (07/2012); Tubal ligation; Colonoscopy; Total abdominal hysterectomy (2002); and Shoulder arthroscopy with rotator cuff repair (Right, 06/15/2021).   Her family history includes Cancer in her sister; Colon cancer in her mother; Diabetes in her father; Heart attack in her father; Thyroid disease in her father.She reports that she has never smoked. She has never used smokeless tobacco. She reports that she does not drink alcohol and does not use drugs.    ROS Review of Systems  Constitutional: Negative.   HENT:  Negative for congestion.   Eyes:  Negative for visual disturbance.  Respiratory:  Negative for shortness of breath.   Cardiovascular:  Negative for chest pain.  Gastrointestinal:  Negative for abdominal pain, constipation, diarrhea, nausea and vomiting.  Genitourinary:  Negative for difficulty urinating.  Musculoskeletal:  Negative for arthralgias and myalgias.  Neurological:  Negative for headaches.  Psychiatric/Behavioral:  Negative for sleep disturbance.     Objective:  BP 99/62   Pulse 61   Temp 97.8 F (36.6 C)   Ht 5' 1 (1.549 m)   Wt 215 lb (97.5 kg)   SpO2 95%   BMI 40.62 kg/m   BP Readings from Last 3 Encounters:  10/02/23 99/62  07/03/23 97/65  03/13/23 110/76    Wt Readings from Last 3 Encounters:  10/02/23 215 lb (97.5 kg)  07/03/23  213 lb (96.6 kg)  03/13/23 211 lb (95.7 kg)     Physical Exam Physical Exam GENERAL: Alert, cooperative, well developed, no acute distress HEENT: Normocephalic, normal oropharynx, moist mucous membranes CHEST: Clear to auscultation bilaterally, No wheezes, rhonchi, or crackles CARDIOVASCULAR: Normal heart rate and rhythm, S1 and S2 normal without murmurs ABDOMEN: Soft, non-tender, non-distended, without organomegaly, Normal bowel sounds EXTREMITIES: No cyanosis or edema NEUROLOGICAL: Cranial nerves grossly intact, Moves all extremities without gross motor or sensory deficit   Assessment & Plan:  Type 2 diabetes mellitus with diabetic neuropathy, with long-term current use of insulin  (HCC)  Uncontrolled type 2 diabetes mellitus with hyperglycemia, with long-term current use of insulin  (HCC) -     Vitamin B12 -     Bayer DCA Hb A1c Waived -     CMP14+EGFR -     HumuLIN  70/30 KwikPen; Inject 46 Units into the skin with breakfast, and 20 with evening meal. DO NOT inject at lunchtime or any other time. MAX 70 units dailyI  Dispense: 60 mL; Refill: 3  Mixed hyperlipidemia -     Lipid panel -     CBC with Differential/Platelet  Essential hypertension, benign  Foot pain, bilateral    Assessment and Plan Assessment & Plan Type 2 diabetes mellitus with hyperglycemia and diabetic neuropathy   Fasting blood sugars are generally well-controlled, but she experiences occasional hyperglycemic episodes likely due to missed insulin  doses. Her A1c remains elevated at 8.5%. She has diabetic neuropathy with burning pain primarily in the heels, indicating nerve damage. Increase morning dose of Humulin  70/30 to 46 units and evening dose to 20 units. Increase gabapentin  to 2 in the morning and 2 in the evening. Consider using an ankle device to improve circulation in the feet. Reassess A1c and blood sugar control at the next visit.       Follow-up: Return in about 3 months (around  01/02/2024).  Butler Der, M.D.

## 2023-10-03 LAB — CMP14+EGFR
ALT: 15 IU/L (ref 0–32)
AST: 17 IU/L (ref 0–40)
Albumin: 4.3 g/dL (ref 3.9–4.9)
Alkaline Phosphatase: 102 IU/L (ref 49–135)
BUN/Creatinine Ratio: 21 (ref 12–28)
BUN: 22 mg/dL (ref 8–27)
Bilirubin Total: 0.3 mg/dL (ref 0.0–1.2)
CO2: 22 mmol/L (ref 20–29)
Calcium: 9.1 mg/dL (ref 8.7–10.3)
Chloride: 99 mmol/L (ref 96–106)
Creatinine, Ser: 1.03 mg/dL — ABNORMAL HIGH (ref 0.57–1.00)
Globulin, Total: 2.5 g/dL (ref 1.5–4.5)
Glucose: 169 mg/dL — ABNORMAL HIGH (ref 70–99)
Potassium: 4 mmol/L (ref 3.5–5.2)
Sodium: 138 mmol/L (ref 134–144)
Total Protein: 6.8 g/dL (ref 6.0–8.5)
eGFR: 60 mL/min/1.73 (ref >59)

## 2023-10-03 LAB — CBC WITH DIFFERENTIAL/PLATELET
Basophils Absolute: 0.1 x10E3/uL (ref 0.0–0.2)
Basos: 1 %
EOS (ABSOLUTE): 0.4 x10E3/uL (ref 0.0–0.4)
Eos: 5 %
Hematocrit: 41.2 % (ref 34.0–46.6)
Hemoglobin: 12.6 g/dL (ref 11.1–15.9)
Immature Grans (Abs): 0 x10E3/uL (ref 0.0–0.1)
Immature Granulocytes: 0 %
Lymphocytes Absolute: 1.9 x10E3/uL (ref 0.7–3.1)
Lymphs: 20 %
MCH: 24.2 pg — ABNORMAL LOW (ref 26.6–33.0)
MCHC: 30.6 g/dL — ABNORMAL LOW (ref 31.5–35.7)
MCV: 79 fL (ref 79–97)
Monocytes Absolute: 0.6 x10E3/uL (ref 0.1–0.9)
Monocytes: 7 %
Neutrophils Absolute: 6.4 x10E3/uL (ref 1.4–7.0)
Neutrophils: 67 %
Platelets: 325 x10E3/uL (ref 150–450)
RBC: 5.21 x10E6/uL (ref 3.77–5.28)
RDW: 14.7 % (ref 11.7–15.4)
WBC: 9.4 x10E3/uL (ref 3.4–10.8)

## 2023-10-03 LAB — VITAMIN B12: Vitamin B-12: 398 pg/mL (ref 232–1245)

## 2023-10-03 LAB — LIPID PANEL
Cholesterol, Total: 140 mg/dL (ref 100–199)
HDL: 53 mg/dL (ref >39)
LDL CALC COMMENT:: 2.6 ratio (ref 0.0–4.4)
LDL Chol Calc (NIH): 61 mg/dL (ref 0–99)
Triglycerides: 151 mg/dL — AB (ref 0–149)
VLDL Cholesterol Cal: 26 mg/dL (ref 5–40)

## 2023-10-06 ENCOUNTER — Ambulatory Visit: Payer: Self-pay | Admitting: Family Medicine

## 2023-10-06 NOTE — Progress Notes (Signed)
 Hello [name],  Your lab result is normal and/or stable.Some minor variations that are not significant are commonly marked abnormal, but do not represent any medical problem for you.  Best regards, Mechele Claude, M.D.

## 2023-10-11 ENCOUNTER — Telehealth: Payer: Self-pay

## 2023-10-23 NOTE — Progress Notes (Signed)
 Sara Stuart                                          MRN: [id number]   10/23/2023   The VBCI Quality Team Specialist reviewed this patient medical record for the purposes of chart review for care gap closure. The following were reviewed: abstraction for care gap closure-glycemic status assessment.    VBCI Quality Team

## 2023-10-23 NOTE — Progress Notes (Signed)
 Sara Stuart                                          MRN: [id number]   10/23/2023   The VBCI Quality Team Specialist reviewed this patient medical record for the purposes of chart review for care gap closure. The following were reviewed: chart review for care gap closure-kidney health evaluation for diabetes:eGFR  and uACR.    VBCI Quality Team

## 2023-10-23 NOTE — Progress Notes (Signed)
 Sara Stuart                                          MRN: [id number]   10/23/2023   The VBCI Quality Team Specialist reviewed this patient medical record for the purposes of chart review for care gap closure. The following were reviewed: abstraction for care gap closure-diabetic eye exam.    VBCI Quality Team

## 2023-12-02 ENCOUNTER — Telehealth: Payer: Self-pay | Admitting: Family Medicine

## 2023-12-02 NOTE — Telephone Encounter (Signed)
 Spoke with patient to schedule her AWV and she asked if Sara Stuart could help her with getting diabetic sensors.  Thank you,  [name],  AMB Clinical Support [facility] AWV Program Direct Dial ??[phone]

## 2023-12-03 NOTE — Telephone Encounter (Signed)
 Rcvd fax from [facility] needing up to date F2F notes for CGM and signed SWO - which was not included in fax. Faxed back current OV notes.

## 2023-12-04 ENCOUNTER — Telehealth: Payer: Self-pay | Admitting: Pharmacist

## 2023-12-04 ENCOUNTER — Ambulatory Visit: Payer: Self-pay

## 2023-12-04 NOTE — Telephone Encounter (Signed)
 FYI Only or Action Required?: FYI only for provider: ED advised.  Patient was last seen in primary care on 10/02/2023 by Zollie Lowers, MD.  Called Nurse Triage reporting Shortness of Breath and Abdominal Pain.  Symptoms began several days ago.  Interventions attempted: OTC medications: pepto.  Symptoms are: gradually worsening.  Triage Disposition: Go to ED Now (or PCP Triage) (overriding See HCP Within 4 Hours (Or PCP Triage))  Patient/caregiver understands and will follow disposition?: Yes    Copied from CRM #[id number]. Topic: Clinical - Red Word Triage >> Dec 04, 2023 10:20 AM Farrel B wrote: [facility] that prompted transfer to Nurse Triage: stomach pain and feeling like its on fire, diarrhea, no fever, SOB Reason for Disposition  [1] MILD-MODERATE pain AND [2] constant AND [3] present > 2 hours  [1] MILD difficulty breathing (e.g., minimal/no SOB at rest, SOB with walking, pulse < 100) AND [2] NEW-onset or WORSE than normal  Answer Assessment - Initial Assessment Questions Burning in chest that started 2-3 days ago states now is in her stomach. States it started right between her breast. Also states she is having shortness of breath, worse with exertion, cough and congestion, diarrhea, nausea, denies any fever but is now having abdominal pain. Pulse OX 91%. Denies any urinary symptoms or dizziness. Concerns are the burning pain in her chest and shortness of breath. States it has been intermittent about the last 6 months. RN did recommend ER for eval due to that pain recently, current abdominal pain, shortness of breath along with O2@ 91% at rest. Pt initially stated she didn't have a way to get there. RN offered to call 911, pt stated no she'll just get some meds for home. RN again gave rationale for why the patient needed to be evaluated in the Er. Pt stated she would call her son to take.       1. RESPIRATORY STATUS: Describe your breathing? (e.g., wheezing, shortness of  breath, unable to speak, severe coughing)      Shortness of breath 2. ONSET: When did this breathing problem begin?      Intermittent last 6 months 3. PATTERN Does the difficult breathing come and go, or has it been constant since it started?      Worse with exertion 4. SEVERITY: How bad is your breathing? (e.g., mild, moderate, severe)      Mild to mod 6. CARDIAC HISTORY: Do you have any history of heart disease? (e.g., heart attack, angina, bypass surgery, angioplasty)      CAD 7. LUNG HISTORY: Do you have any history of lung disease?  (e.g., pulmonary embolus, asthma, emphysema)     no 8. CAUSE: What do you think is causing the breathing problem?      unknown 9. OTHER SYMPTOMS: Do you have any other symptoms? (e.g., chest pain, cough, dizziness, fever, runny nose)     Burning in chest that has moved to her abdomen, nausea, diarrhea, chest congestion, cough 10. O2 SATURATION MONITOR:  Do you use an oxygen saturation monitor (pulse oximeter) at home? If Yes, ask: What is your reading (oxygen level) today? What is your usual oxygen saturation reading? (e.g., 95%)       91% at rest  Answer Assessment - Initial Assessment Questions 1. LOCATION: Where does it hurt?      Whole adomen 2. RADIATION: Does the pain shoot anywhere else? (e.g., chest, back)     Denies but did start in her chest 3. ONSET: When did the pain  begin? (e.g., minutes, hours or days ago)      2-3 days ago 4. SUDDEN: Gradual or sudden onset?     Started in chest about 2-3 days ago, later that day radiated to stomach. She states it feels like it's burning on the inside.  5. PATTERN Does the pain come and go, or is it constant?     constant 6. SEVERITY: How bad is the pain?  (e.g., Scale 1-10; mild, moderate, or severe)     7/10 8. CAUSE: What do you think is causing the stomach pain? (e.g., gallstones, recent abdominal surgery)     unknown 9. RELIEVING/AGGRAVATING FACTORS: What makes  it better or worse? (e.g., antacids, bending or twisting motion, bowel movement)     Stated pepto helped a little this am 10. OTHER SYMPTOMS: Do you have any other symptoms? (e.g., back pain, diarrhea, fever, urination pain, vomiting)       Nausea, diarrhea, sob  Protocols used: Breathing Difficulty-A-AH, Abdominal Pain - Female-A-AH

## 2023-12-04 NOTE — Telephone Encounter (Signed)
Noted  -LS

## 2023-12-06 ENCOUNTER — Other Ambulatory Visit: Payer: Self-pay | Admitting: Family Medicine

## 2023-12-06 DIAGNOSIS — I2510 Atherosclerotic heart disease of native coronary artery without angina pectoris: Secondary | ICD-10-CM

## 2023-12-06 DIAGNOSIS — E782 Mixed hyperlipidemia: Secondary | ICD-10-CM

## 2023-12-06 DIAGNOSIS — I10 Essential (primary) hypertension: Secondary | ICD-10-CM

## 2023-12-27 ENCOUNTER — Other Ambulatory Visit: Payer: Self-pay | Admitting: Family Medicine

## 2023-12-27 DIAGNOSIS — I10 Essential (primary) hypertension: Secondary | ICD-10-CM

## 2023-12-30 ENCOUNTER — Other Ambulatory Visit: Payer: Self-pay | Admitting: Family Medicine

## 2024-01-06 ENCOUNTER — Ambulatory Visit

## 2024-01-20 ENCOUNTER — Telehealth: Payer: Self-pay | Admitting: Pharmacist

## 2024-01-20 DIAGNOSIS — E119 Type 2 diabetes mellitus without complications: Secondary | ICD-10-CM

## 2024-01-20 NOTE — Telephone Encounter (Signed)
 Patient assistance changes for 2026 Patient also having difficulties with CGM access Will place [id number] to Pharmacy to set appt to discuss medication management of T2DM needs.  Farzana Koci Dattero Sylvan Lahm, PharmD, BCACP, CPP Clinical Pharmacist, [facility]

## 2024-01-20 NOTE — Telephone Encounter (Signed)
 Previously followed by Pharmacy for medication management and assistance.  Will have patient scheduled to review PAP changes and CGM access.  Audrena Talaga Dattero Karthika Glasper, PharmD, BCACP, CPP Clinical Pharmacist, [facility]

## 2024-01-22 ENCOUNTER — Telehealth: Payer: Self-pay

## 2024-01-22 NOTE — Progress Notes (Signed)
 Care Guide Pharmacy Note  01/22/2024 Name: Sara Stuart MRN: [id number] DOB: 01-27-1955  Referred By: Zollie Lowers, MD Reason for referral: Complex Care Management (Outreach to schedule with Pharm d )   Sara Stuart is a 69 y.o. year old female who is a primary care patient of Stacks, Lowers, MD.  Sara Stuart was referred to the pharmacist for assistance related to: COPD and DMII  Successful contact was made with the patient to discuss pharmacy services including being ready for the pharmacist to call at least 5 minutes before the scheduled appointment time and to have medication bottles and any blood pressure readings ready for review. The patient agreed to meet with the pharmacist via telephone visit on (date/time).02/27/2024  Jeoffrey Buffalo , RMA     [place]  [facility], [facility] Guide  Direct Dial: [phone]  Website: [email or web address]

## 2024-01-22 NOTE — Progress Notes (Signed)
 Care Guide Pharmacy Note  01/22/2024 Name: Sara Stuart MRN: [id number] DOB: [birth year]  Referred By: Zollie Lowers, MD Reason for referral: Complex Care Management (Outreach to schedule with Pharm d )   Sara Stuart is a 69 y.o. year old female who is a primary care patient of Stacks, Lowers, MD.  Sara Stuart was referred to the pharmacist for assistance related to: DMII  An unsuccessful telephone outreach was attempted today to contact the patient who was referred to the pharmacy team for assistance with medication assistance. Additional attempts will be made to contact the patient.  Jeoffrey Buffalo , RMA     [facility]  [facility], [facility] Guide  Direct Dial: [phone]  Website: [email or web address]

## 2024-01-28 ENCOUNTER — Other Ambulatory Visit: Payer: Self-pay

## 2024-01-28 DIAGNOSIS — E1165 Type 2 diabetes mellitus with hyperglycemia: Secondary | ICD-10-CM

## 2024-01-28 MED ORDER — TRESIBA FLEXTOUCH 100 UNIT/ML ~~LOC~~ SOPN: 55.0000 [IU] | PEN_INJECTOR | Freq: Every day | SUBCUTANEOUS | 0 refills | Status: DC

## 2024-01-28 NOTE — Telephone Encounter (Signed)
 Pt is out of her Tresiba , sent in a supply to local [facility]. This usually come to the office, she is on the patient assistance program, will send to the clinical pool for someone to check the refrigerator and call her back.

## 2024-01-28 NOTE — Telephone Encounter (Signed)
 Copied from CRM [id number]. Topic: Clinical - Medication Question >> Jan 28, 2024 11:57 AM Alfonso ORN wrote: Reason for CRM: patient requesting to get a refill for  insulin  degludec (TRESIBA  FLEXTOUCH) 100 UNIT/ML FlexTouch Pen Patient is completed out of the medication   Patient stated it normally comes to the office  ----------------------------------------------------------------------- From previous Reason for Contact - Medication Refill: Medication:   Has the patient contacted their pharmacy?   (Agent: If no, request that the patient contact the pharmacy for the refill. If patient does not wish to contact the pharmacy document the reason why and proceed with request.) (Agent: If yes, when and what did the pharmacy advise?)  This is the patient's preferred pharmacy:  [facility] - [place], [place] - [address] [place] [address] Phone: [phone] Fax: [phone]  [facility] - [place], [place] - [address] [place] [address] Phone: [phone] Fax: [phone]  [facility] - [place], [place] - [address] [place] [address] Phone: [phone] Fax: [phone]  [facility] - [place], [place] - [address] [place] [address] Phone: [phone] Fax: [phone]  Is this the correct pharmacy for this prescription?   If no, delete pharmacy and type the correct one.   Has the prescription been filled recently?    Is the patient out of the medication?    Has the patient been seen for an appointment in the last year OR does the patient have an upcoming appointment?    Can we respond through MyChart?    Agent: Please be advised that Rx refills may take up to 3 business days. We ask that you follow-up with your pharmacy.

## 2024-01-29 ENCOUNTER — Ambulatory Visit: Payer: Self-pay | Admitting: Family Medicine

## 2024-01-30 NOTE — Telephone Encounter (Signed)
 No Rx in the refrigerator. [name], can you check on this?    Copied from CRM [id number]. Topic: Clinical - Medication Question >> Jan 28, 2024 11:57 AM Sara Stuart ORN wrote: Reason for CRM: patient requesting to get a refill for  insulin  degludec (TRESIBA  FLEXTOUCH) 100 UNIT/ML FlexTouch Pen Patient is completed out of the medication   Patient stated it normally comes to the office  ----------------------------------------------------------------------- From previous Reason for Contact - Medication Refill: Medication:   Has the patient contacted their pharmacy?   (Agent: If no, request that the patient contact the pharmacy for the refill. If patient does not wish to contact the pharmacy document the reason why and proceed with request.) (Agent: If yes, when and what did the pharmacy advise?)  This is the patient's preferred pharmacy:  [facility] - [place], [place] - [address] [place] [address] Phone: [phone] Fax: [phone]  [facility] - [place], [place] - [address] [place] [address] Phone: [phone] Fax: [phone]  [facility] - [place], [place] - [address] [place] [address] Phone: [phone] Fax: [phone]  [facility] - [place], [place] - [address] [place] [address] Phone: [phone] Fax: [phone]  Is this the correct pharmacy for this prescription?   If no, delete pharmacy and type the correct one.   Has the prescription been filled recently?    Is the patient out of the medication?    Has the patient been seen for an appointment in the last year OR does the patient have an upcoming appointment?    Can we respond through MyChart?    Agent: Please be advised that Rx refills may take up to 3 business days. We ask that you follow-up with your pharmacy. >> Jan 30, 2024 10:07  AM [name] wrote: Patient calling to ask if the office has  insulin  degludec (TRESIBA  FLEXTOUCH) 100 UNIT/ML FlexTouch Pen     Available in the office for pick up she is out of medication at this timeoffice states they will check fridge and call the patient back  Patient num [phone]

## 2024-02-01 ENCOUNTER — Encounter: Payer: Self-pay | Admitting: Pharmacist

## 2024-02-01 ENCOUNTER — Other Ambulatory Visit: Payer: Self-pay | Admitting: Family Medicine

## 2024-02-01 ENCOUNTER — Telehealth: Payer: Self-pay | Admitting: Pharmacist

## 2024-02-01 DIAGNOSIS — M48062 Spinal stenosis, lumbar region with neurogenic claudication: Secondary | ICD-10-CM

## 2024-02-01 MED ORDER — LANTUS SOLOSTAR 100 UNIT/ML ~~LOC~~ SOPN: 55.0000 [IU] | PEN_INJECTOR | Freq: Every day | SUBCUTANEOUS | 99 refills | Status: AC

## 2024-02-01 NOTE — Addendum Note (Signed)
 Addended by: Norlene Lanes D on: 02/01/2024 08:00 AM   Modules accepted: Orders

## 2024-02-01 NOTE — Telephone Encounter (Signed)
 Insulin  replaced with lantus  (tresiba  no longer covered) She will be re-enrolled with [employer] PAP for medications

## 2024-02-01 NOTE — Telephone Encounter (Signed)
 Please re-enroll [employer] Humalog  mix pen insulin  and [name]  55 units nightly.(was on Tresiba ).  Okay to send PAP to me.  Appreciate assistance.  Will route Rxs to [place] when appropriate.  Danon Lograsso Dattero Bhavya Grand, PharmD, BCACP, CPP Clinical Pharmacist, [facility]

## 2024-02-05 ENCOUNTER — Telehealth: Payer: Self-pay

## 2024-02-05 NOTE — Telephone Encounter (Signed)
 PAP: Patient assistance application for [name]  Humalog  Mix through [employer] has been emailed to [employer].

## 2024-02-05 NOTE — Telephone Encounter (Signed)
 Sent via email  New encounter created for following.

## 2024-02-17 ENCOUNTER — Ambulatory Visit: Admitting: Family Medicine

## 2024-02-27 ENCOUNTER — Other Ambulatory Visit
